# Patient Record
Sex: Female | Born: 1995
Health system: Southern US, Community
[De-identification: ages and names within clinical notes are randomized; demographics above are authoritative.]

## PROBLEM LIST (undated history)

## (undated) DIAGNOSIS — T50905A Adverse effect of unspecified drugs, medicaments and biological substances, initial encounter: Secondary | ICD-10-CM

## (undated) DIAGNOSIS — D571 Sickle-cell disease without crisis: Secondary | ICD-10-CM

## (undated) DIAGNOSIS — R519 Headache, unspecified: Secondary | ICD-10-CM

## (undated) DIAGNOSIS — N179 Acute kidney failure, unspecified: Secondary | ICD-10-CM

## (undated) DIAGNOSIS — D649 Anemia, unspecified: Secondary | ICD-10-CM

## (undated) DIAGNOSIS — K219 Gastro-esophageal reflux disease without esophagitis: Secondary | ICD-10-CM

## (undated) DIAGNOSIS — N912 Amenorrhea, unspecified: Secondary | ICD-10-CM

## (undated) DIAGNOSIS — L298 Other pruritus: Secondary | ICD-10-CM

## (undated) DIAGNOSIS — R51 Headache: Secondary | ICD-10-CM

## (undated) HISTORY — PX: SPLENECTOMY: SUR1306

## (undated) HISTORY — DX: Sickle-cell disease without crisis: D57.1

## (undated) HISTORY — PX: TONSILLECTOMY: SUR1361

---
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
## 2014-02-04 ENCOUNTER — Emergency Department (HOSPITAL_COMMUNITY)
Admission: EM | Admit: 2014-02-04 | Discharge: 2014-02-04 | Disposition: A | Discharge status: Home / Self Care | Payer: Medicaid Other | Attending: Emergency Medicine | Admitting: Emergency Medicine

## 2014-02-04 ENCOUNTER — Encounter (HOSPITAL_COMMUNITY): Payer: Self-pay | Admitting: Emergency Medicine

## 2014-02-04 ENCOUNTER — Ambulatory Visit (HOSPITAL_COMMUNITY)
Admission: RE | Admit: 2014-02-04 | Discharge: 2014-02-04 | Disposition: A | Discharge status: Home / Self Care | Payer: Medicaid Other | Source: Ambulatory Visit | Attending: Emergency Medicine | Admitting: Emergency Medicine

## 2014-02-04 DIAGNOSIS — Y9389 Activity, other specified: Secondary | ICD-10-CM | POA: Insufficient documentation

## 2014-02-04 DIAGNOSIS — Z79899 Other long term (current) drug therapy: Secondary | ICD-10-CM | POA: Insufficient documentation

## 2014-02-04 DIAGNOSIS — M25569 Pain in unspecified knee: Secondary | ICD-10-CM | POA: Insufficient documentation

## 2014-02-04 DIAGNOSIS — IMO0002 Reserved for concepts with insufficient information to code with codable children: Secondary | ICD-10-CM | POA: Insufficient documentation

## 2014-02-04 DIAGNOSIS — J45909 Unspecified asthma, uncomplicated: Secondary | ICD-10-CM | POA: Insufficient documentation

## 2014-02-04 DIAGNOSIS — Z862 Personal history of diseases of the blood and blood-forming organs and certain disorders involving the immune mechanism: Secondary | ICD-10-CM | POA: Insufficient documentation

## 2014-02-04 DIAGNOSIS — F17200 Nicotine dependence, unspecified, uncomplicated: Secondary | ICD-10-CM | POA: Insufficient documentation

## 2014-02-04 DIAGNOSIS — Z8742 Personal history of other diseases of the female genital tract: Secondary | ICD-10-CM | POA: Insufficient documentation

## 2014-02-04 DIAGNOSIS — Y92410 Unspecified street and highway as the place of occurrence of the external cause: Secondary | ICD-10-CM | POA: Insufficient documentation

## 2014-02-04 DIAGNOSIS — Z3202 Encounter for pregnancy test, result negative: Secondary | ICD-10-CM | POA: Insufficient documentation

## 2014-02-04 LAB — POC URINE PREG, ED: PREG TEST UR: NEGATIVE

## 2014-02-04 MED ORDER — HYDROCODONE-ACETAMINOPHEN 5-325 MG PO TABS
ORAL_TABLET | ORAL | Status: AC
  Filled 2014-02-04: qty 1

## 2014-02-04 MED ORDER — IBUPROFEN 200 MG PO TABS
ORAL_TABLET | ORAL | Status: AC
  Filled 2014-02-04: qty 2
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
## 2014-02-08 ENCOUNTER — Emergency Department (HOSPITAL_COMMUNITY)
Admission: EM | Admit: 2014-02-08 | Discharge: 2014-02-08 | Disposition: A | Discharge status: Home / Self Care | Payer: Medicaid Other | Attending: Emergency Medicine | Admitting: Emergency Medicine

## 2014-02-08 ENCOUNTER — Telehealth: Payer: Self-pay | Admitting: Family Medicine

## 2014-02-08 ENCOUNTER — Encounter (HOSPITAL_COMMUNITY): Payer: Self-pay | Admitting: Emergency Medicine

## 2014-02-08 DIAGNOSIS — M25569 Pain in unspecified knee: Secondary | ICD-10-CM | POA: Insufficient documentation

## 2014-02-08 DIAGNOSIS — M546 Pain in thoracic spine: Secondary | ICD-10-CM | POA: Insufficient documentation

## 2014-02-08 DIAGNOSIS — F17200 Nicotine dependence, unspecified, uncomplicated: Secondary | ICD-10-CM | POA: Insufficient documentation

## 2014-02-08 DIAGNOSIS — Z79899 Other long term (current) drug therapy: Secondary | ICD-10-CM | POA: Insufficient documentation

## 2014-02-08 DIAGNOSIS — G8911 Acute pain due to trauma: Secondary | ICD-10-CM | POA: Insufficient documentation

## 2014-02-08 DIAGNOSIS — M79604 Pain in right leg: Secondary | ICD-10-CM

## 2014-02-08 DIAGNOSIS — J45909 Unspecified asthma, uncomplicated: Secondary | ICD-10-CM | POA: Insufficient documentation

## 2014-02-08 DIAGNOSIS — Z8742 Personal history of other diseases of the female genital tract: Secondary | ICD-10-CM | POA: Insufficient documentation

## 2014-02-08 DIAGNOSIS — M25562 Pain in left knee: Secondary | ICD-10-CM

## 2014-02-08 DIAGNOSIS — M549 Dorsalgia, unspecified: Secondary | ICD-10-CM

## 2014-02-08 DIAGNOSIS — Z862 Personal history of diseases of the blood and blood-forming organs and certain disorders involving the immune mechanism: Secondary | ICD-10-CM | POA: Insufficient documentation

## 2014-02-08 MED ORDER — HYDROCODONE-ACETAMINOPHEN 5-325 MG PO TABS
2.0000 | ORAL_TABLET | Freq: Once | ORAL | Status: AC
  Administered 2014-02-08: 2 via ORAL
  Filled 2014-02-08: qty 2

## 2014-02-08 MED ORDER — OXYCODONE HCL 5 MG PO TABS: 5.0000 mg | ORAL_TABLET | ORAL | Status: DC | PRN

## 2014-02-08 NOTE — Discharge Instructions (Signed)
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
## 2014-02-08 NOTE — Telephone Encounter (Signed)
Emergency Line Phone Call  Patient calls stating she went to the [facility] ED a few nights ago for pain in her legs and back. She was discharged home with pain medication to take for this pain. She states she is continuing to have pain despite these medications. She states that she is breathing well. Note the patient has sickle cell SS disease. Given that the patient may be in a pain crisis at this time, I advised her to go to the ED for further evaluation. She voiced understanding of this.  Tommi Rumps, MD

## 2014-02-12 ENCOUNTER — Ambulatory Visit: Payer: Medicaid Other | Admitting: Family Medicine

## 2014-02-12 NOTE — ED Provider Notes (Signed)
Medical screening examination/treatment/procedure(s) were performed by non-physician practitioner and as supervising physician I was immediately available for consultation/collaboration.    Dot Lanes, MD 02/12/14 [phone]
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
## 2014-02-16 ENCOUNTER — Ambulatory Visit: Payer: Medicaid Other | Admitting: Family Medicine

## 2014-02-18 ENCOUNTER — Ambulatory Visit: Payer: Medicaid Other | Admitting: Family Medicine

## 2014-02-24 ENCOUNTER — Other Ambulatory Visit (HOSPITAL_COMMUNITY)
Admission: RE | Admit: 2014-02-24 | Discharge: 2014-02-24 | Disposition: A | Discharge status: Home / Self Care | Payer: Medicaid Other | Source: Ambulatory Visit | Attending: Family Medicine | Admitting: Family Medicine

## 2014-02-24 ENCOUNTER — Encounter: Payer: Self-pay | Admitting: Family Medicine

## 2014-02-24 ENCOUNTER — Ambulatory Visit (INDEPENDENT_AMBULATORY_CARE_PROVIDER_SITE_OTHER): Payer: Medicaid Other | Admitting: Family Medicine

## 2014-02-24 VITALS — BP 124/64 | HR 91 | Temp 98.2°F | Ht 64.0 in | Wt 134.0 lb

## 2014-02-24 DIAGNOSIS — Z7251 High risk heterosexual behavior: Secondary | ICD-10-CM

## 2014-02-24 DIAGNOSIS — Z113 Encounter for screening for infections with a predominantly sexual mode of transmission: Secondary | ICD-10-CM | POA: Insufficient documentation

## 2014-02-24 DIAGNOSIS — N898 Other specified noninflammatory disorders of vagina: Secondary | ICD-10-CM

## 2014-02-24 LAB — POCT WET PREP (WET MOUNT)
CLUE CELLS WET PREP WHIFF POC: POSITIVE
WBC, Wet Prep HPF POC: >20

## 2014-02-24 LAB — RPR

## 2014-02-24 MED ORDER — METRONIDAZOLE 500 MG PO TABS: 500.0000 mg | ORAL_TABLET | Freq: Two times a day (BID) | ORAL | Status: DC

## 2014-02-24 MED ORDER — FLUCONAZOLE 150 MG PO TABS: 150.0000 mg | ORAL_TABLET | Freq: Once | ORAL | Status: DC

## 2014-02-24 NOTE — Addendum Note (Signed)
Addended by: Corinna Capra on: 02/24/2014 04:51 PM   Modules accepted: Orders

## 2014-02-24 NOTE — Progress Notes (Signed)
Family Medicine Office Visit Note   Subjective:   Patient ID: [name], female  DOB: August 06, 1996, 18 y.o.. MRN: [id number]   Pt that comes today complaining of vaginal discharge for about a week. She reports this happened after sexual intercourse and she is concerned about STD's. Denies pelvic pain, dyspareunia, fever, chills, nausea,vomiting, rashes, joint pain or other systemic symptoms. Pt mother died 55 y/o with HIV.  Review of Systems:  Per HPI  Objective:   Physical Exam: Gen:  NAD HEENT: Moist mucous membranes  CV: Regular rate and rhythm, no murmurs rubs or gallops PULM: Clear to auscultation bilaterally. No wheezes/rales/rhonchi ABD: Soft, non tender, non distended, normal bowel sounds EXT: No edema Vulva and perianal area: Normal  Speculum: Vagina and cervix of normal appearance, no friability, white-yellowish curd-like discharge. Bimanual exam: Uterus anteverted, no adnexal masses. No cervical motion tenderness.  Assessment & Plan:

## 2014-02-24 NOTE — Assessment & Plan Note (Signed)
Will obtain wet prep, GC &CT, RPR and HIV Discussed the use of condom.  Wet prep positive for BV and yeast. Will treat with fluconazole and metronidazole.

## 2014-02-24 NOTE — Patient Instructions (Addendum)
It has been a pleasure to see you today. I have prescribes you 2 medications for BV and yeast.  I will call you with the rest pf labs results if they come back abnormal. Use condom for protection.

## 2014-02-25 ENCOUNTER — Inpatient Hospital Stay (HOSPITAL_COMMUNITY)
Admission: EM | Admit: 2014-02-25 | Discharge: 2014-03-02 | DRG: 812 | Disposition: A | Discharge status: Home / Self Care | Payer: Medicaid Other | Attending: Family Medicine | Admitting: Family Medicine

## 2014-02-25 DIAGNOSIS — K5900 Constipation, unspecified: Secondary | ICD-10-CM | POA: Diagnosis present

## 2014-02-25 DIAGNOSIS — N760 Acute vaginitis: Secondary | ICD-10-CM | POA: Diagnosis present

## 2014-02-25 DIAGNOSIS — N390 Urinary tract infection, site not specified: Secondary | ICD-10-CM | POA: Diagnosis present

## 2014-02-25 DIAGNOSIS — F1410 Cocaine abuse, uncomplicated: Secondary | ICD-10-CM | POA: Diagnosis present

## 2014-02-25 DIAGNOSIS — A09 Infectious gastroenteritis and colitis, unspecified: Secondary | ICD-10-CM

## 2014-02-25 DIAGNOSIS — Z9089 Acquired absence of other organs: Secondary | ICD-10-CM

## 2014-02-25 DIAGNOSIS — R011 Cardiac murmur, unspecified: Secondary | ICD-10-CM | POA: Diagnosis present

## 2014-02-25 DIAGNOSIS — J45909 Unspecified asthma, uncomplicated: Secondary | ICD-10-CM

## 2014-02-25 DIAGNOSIS — K5289 Other specified noninfective gastroenteritis and colitis: Secondary | ICD-10-CM | POA: Diagnosis present

## 2014-02-25 DIAGNOSIS — F1210 Cannabis abuse, uncomplicated: Secondary | ICD-10-CM | POA: Diagnosis present

## 2014-02-25 DIAGNOSIS — B9689 Other specified bacterial agents as the cause of diseases classified elsewhere: Secondary | ICD-10-CM | POA: Diagnosis present

## 2014-02-25 DIAGNOSIS — B373 Candidiasis of vulva and vagina: Secondary | ICD-10-CM | POA: Diagnosis present

## 2014-02-25 DIAGNOSIS — A499 Bacterial infection, unspecified: Secondary | ICD-10-CM | POA: Diagnosis present

## 2014-02-25 DIAGNOSIS — K921 Melena: Secondary | ICD-10-CM

## 2014-02-25 DIAGNOSIS — Z8249 Family history of ischemic heart disease and other diseases of the circulatory system: Secondary | ICD-10-CM

## 2014-02-25 DIAGNOSIS — J4520 Mild intermittent asthma, uncomplicated: Secondary | ICD-10-CM

## 2014-02-25 DIAGNOSIS — D5700 Hb-SS disease with crisis, unspecified: Principal | ICD-10-CM | POA: Diagnosis present

## 2014-02-25 DIAGNOSIS — F17200 Nicotine dependence, unspecified, uncomplicated: Secondary | ICD-10-CM | POA: Diagnosis present

## 2014-02-25 DIAGNOSIS — B3731 Acute candidiasis of vulva and vagina: Secondary | ICD-10-CM | POA: Diagnosis present

## 2014-02-25 DIAGNOSIS — N898 Other specified noninflammatory disorders of vagina: Secondary | ICD-10-CM

## 2014-02-25 LAB — RETICULOCYTES
RBC.: 2.73 MIL/uL — ABNORMAL LOW (ref 3.87–5.11)
Retic Count, Absolute: 453.2 10*3/uL — ABNORMAL HIGH (ref 19.0–186.0)
Retic Ct Pct: 16.6 % — ABNORMAL HIGH (ref 0.4–3.1)

## 2014-02-25 LAB — CBC WITH DIFFERENTIAL/PLATELET
Basophils Absolute: 0 10*3/uL (ref 0.0–0.1)
Basophils Relative: 0 % (ref 0–1)
EOS ABS: 0.4 10*3/uL (ref 0.0–0.7)
EOS PCT: 3 % (ref 0–5)
HCT: 25.9 % — ABNORMAL LOW (ref 36.0–46.0)
HEMOGLOBIN: 9.4 g/dL — AB (ref 12.0–15.0)
LYMPHS ABS: 5.1 10*3/uL — AB (ref 0.7–4.0)
LYMPHS PCT: 36 % (ref 12–46)
MCH: 34.4 pg — AB (ref 26.0–34.0)
MCHC: 36.3 g/dL — ABNORMAL HIGH (ref 30.0–36.0)
MCV: 94.9 fL (ref 78.0–100.0)
Monocytes Absolute: 1.3 10*3/uL — ABNORMAL HIGH (ref 0.1–1.0)
Monocytes Relative: 9 % (ref 3–12)
Neutro Abs: 7.3 10*3/uL (ref 1.7–7.7)
Neutrophils Relative %: 52 % (ref 43–77)
Platelets: 743 10*3/uL — ABNORMAL HIGH (ref 150–400)
RBC: 2.73 MIL/uL — AB (ref 3.87–5.11)
RDW: 17.8 % — ABNORMAL HIGH (ref 11.5–15.5)
WBC: 14.1 10*3/uL — ABNORMAL HIGH (ref 4.0–10.5)

## 2014-02-25 LAB — URINALYSIS, ROUTINE W REFLEX MICROSCOPIC
BILIRUBIN URINE: NEGATIVE
GLUCOSE, UA: NEGATIVE mg/dL
Ketones, ur: NEGATIVE mg/dL
Nitrite: NEGATIVE
PH: 6 (ref 5.0–8.0)
Protein, ur: NEGATIVE mg/dL
Specific Gravity, Urine: 1.011 (ref 1.005–1.030)
Urobilinogen, UA: 1 mg/dL (ref 0.0–1.0)

## 2014-02-25 LAB — COMPREHENSIVE METABOLIC PANEL
ALK PHOS: 68 U/L (ref 39–117)
ALT: 15 U/L (ref 0–35)
AST: 18 U/L (ref 0–37)
Albumin: 4.4 g/dL (ref 3.5–5.2)
BUN: 5 mg/dL — ABNORMAL LOW (ref 6–23)
CALCIUM: 10 mg/dL (ref 8.4–10.5)
CO2: 24 mEq/L (ref 19–32)
Chloride: 98 mEq/L (ref 96–112)
Creatinine, Ser: 0.74 mg/dL (ref 0.50–1.10)
GLUCOSE: 91 mg/dL (ref 70–99)
POTASSIUM: 3.7 meq/L (ref 3.7–5.3)
Sodium: 137 mEq/L (ref 137–147)
Total Bilirubin: 4 mg/dL — ABNORMAL HIGH (ref 0.3–1.2)
Total Protein: 8 g/dL (ref 6.0–8.3)

## 2014-02-25 LAB — URINE MICROSCOPIC-ADD ON

## 2014-02-25 LAB — CERVICOVAGINAL ANCILLARY ONLY
Chlamydia: NEGATIVE
NEISSERIA GONORRHEA: NEGATIVE

## 2014-02-25 LAB — POC OCCULT BLOOD, ED: Fecal Occult Bld: POSITIVE — AB

## 2014-02-25 LAB — HIV ANTIBODY (ROUTINE TESTING W REFLEX): HIV: NONREACTIVE

## 2014-02-25 LAB — POC URINE PREG, ED: Preg Test, Ur: NEGATIVE

## 2014-02-25 MED ORDER — SODIUM CHLORIDE 0.9 % IV BOLUS (SEPSIS): 1000.0000 mL | Freq: Once | INTRAVENOUS | Status: DC

## 2014-02-25 MED ORDER — SODIUM CHLORIDE 0.9 % IV BOLUS (SEPSIS)
1000.0000 mL | Freq: Once | INTRAVENOUS | Status: AC
  Administered 2014-02-25: 1000 mL via INTRAVENOUS

## 2014-02-25 MED ORDER — HYDROMORPHONE HCL PF 2 MG/ML IJ SOLN
2.0000 mg | Freq: Once | INTRAMUSCULAR | Status: AC
  Administered 2014-02-25: 2 mg via INTRAVENOUS
  Filled 2014-02-25: qty 1

## 2014-02-25 MED ORDER — HYDROMORPHONE HCL PF 1 MG/ML IJ SOLN
1.0000 mg | Freq: Once | INTRAMUSCULAR | Status: AC
  Administered 2014-02-25: 1 mg via INTRAVENOUS
  Filled 2014-02-25: qty 1

## 2014-02-25 NOTE — ED Provider Notes (Signed)
CSN: [id number]     Arrival date & time 02/25/14  01-[birth year]1915 History   First MD Initiated Contact with Patient 02/25/14 1923     Chief Complaint  Patient presents with  . Back Pain  . Sickle Cell Pain Crisis     (Consider location/radiation/quality/duration/timing/severity/associated sxs/prior Treatment) HPI Comments: Patient presents emergency department with chief complaint of back pain and rectal pain. 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 She states this feels like her typical pain. She also states that for the past 4 days, she has had to strain to have a bowel movement. She does report having bright red blood when wiping. She normally takes morphine, and oxycodone for her sickle cell pain. She states that she has run out of her pain meds. She has not had anything for the past week. She reports moderate to severe pain. She denies any chest pain or shortness of breath. Denies any abdominal pain or dysuria.  The history is provided by the patient. No language interpreter was used.    Past Medical History  Diagnosis Date  . Asthma   . Sickle cell disease   . Amenorrhea    Past Surgical History  Procedure Laterality Date  . 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 Tonsillectomy      Age 78   Family History  Problem Relation Age of Onset  . Hypertension Paternal Grandfather   . Sickle cell trait Father   . Cancer Mother     Died in 01/31/09   History  Substance Use Topics  . Smoking status: Current Some Day Smoker -- 1.00 packs/day    Types: Cigarettes  . Smokeless tobacco: Never Used  . Alcohol Use: 3.6 oz/week    6 Shots of liquor per week   OB History   Grav Para Term Preterm Abortions TAB SAB Ect Mult Living                 Review of Systems  Constitutional: Negative for fever and chills.  Respiratory: Negative for shortness of breath.   Cardiovascular: Negative for chest pain.  Gastrointestinal: Negative for nausea, vomiting, diarrhea and  constipation.  Genitourinary: Negative for dysuria.  Musculoskeletal: Positive for back pain.      Allergies  Review of patient's allergies indicates no known allergies.  Home Medications   Prior to Admission medications   Medication Sig Start Date End Date Taking? Authorizing Provider  albuterol (PROVENTIL HFA;VENTOLIN HFA) 108 (90 BASE) MCG/ACT inhaler Inhale 2 puffs into the lungs every 4 (four) hours as needed for wheezing or shortness of breath. For shortness of breath 08/01/13   Marin Olp, MD  fluconazole (DIFLUCAN) 150 MG tablet Take 1 tablet (150 mg total) by mouth once. 02/24/14   Dayarmys Piloto de Gwendalyn Ege, MD  ibuprofen (ADVIL,MOTRIN) 200 MG tablet Take 800 mg by mouth every 6 (six) hours as needed for mild pain.    Historical Provider, MD  metroNIDAZOLE (FLAGYL) 500 MG tablet Take 1 tablet (500 mg total) by mouth 2 (two) times daily. 02/24/14   Dayarmys Piloto de Gwendalyn Ege, MD  oxyCODONE (ROXICODONE) 5 MG immediate release tablet Take 1 tablet (5 mg total) by mouth every 4 (four) hours as needed for severe pain. 02/08/14   Antonietta Breach, PA-C   BP 116/60  Pulse 83  Temp(Src) 98.2 F (36.8 C) (Oral)  Resp 20  SpO2 96%  LMP 12/28/2013 Physical Exam  Nursing note and vitals  reviewed. Constitutional: She is oriented to person, place, and time. She appears well-developed and well-nourished.  HENT:  Head: Normocephalic and atraumatic.  Eyes: Conjunctivae and EOM are normal. Pupils are equal, round, and reactive to light.  Neck: Normal range of motion. Neck supple.  Cardiovascular: Normal rate and regular rhythm.  Exam reveals no gallop and no friction rub.   No murmur heard. Pulmonary/Chest: Effort normal and breath sounds normal. No respiratory distress. She has no wheezes. She has no rales. She exhibits no tenderness.  Abdominal: Soft. Bowel sounds are normal. She exhibits no distension and no mass. There is no tenderness. There is no rebound and no guarding.   Genitourinary:  Rectal exam: Chaperone present, no internal or external hemorrhoids, no palpable stool ball, no fissure, no evidence of rectal abscess, no gross blood or hemorrhage  Musculoskeletal: Normal range of motion. She exhibits no edema and no tenderness.  Neurological: She is alert and oriented to person, place, and time.  Skin: Skin is warm and dry.  Psychiatric: She has a normal mood and affect. Her behavior is normal. Judgment and thought content normal.    ED Course  Procedures (including critical care time) Results for orders placed during the hospital encounter of 02/25/14  CBC WITH DIFFERENTIAL      Result Value Ref Range   WBC 14.1 (*) 4.0 - 10.5 K/uL   RBC 2.73 (*) 3.87 - 5.11 MIL/uL   Hemoglobin 9.4 (*) 12.0 - 15.0 g/dL   HCT 25.9 (*) 36.0 - 46.0 %   MCV 94.9  78.0 - 100.0 fL   MCH 34.4 (*) 26.0 - 34.0 pg   MCHC 36.3 (*) 30.0 - 36.0 g/dL   RDW 17.8 (*) 11.5 - 15.5 %   Platelets 743 (*) 150 - 400 K/uL   Neutrophils Relative % 52  43 - 77 %   Neutro Abs 7.3  1.7 - 7.7 K/uL   Lymphocytes Relative 36  12 - 46 %   Lymphs Abs 5.1 (*) 0.7 - 4.0 K/uL   Monocytes Relative 9  3 - 12 %   Monocytes Absolute 1.3 (*) 0.1 - 1.0 K/uL   Eosinophils Relative 3  0 - 5 %   Eosinophils Absolute 0.4  0.0 - 0.7 K/uL   Basophils Relative 0  0 - 1 %   Basophils Absolute 0.0  0.0 - 0.1 K/uL  COMPREHENSIVE METABOLIC PANEL      Result Value Ref Range   Sodium 137  137 - 147 mEq/L   Potassium 3.7  3.7 - 5.3 mEq/L   Chloride 98  96 - 112 mEq/L   CO2 24  19 - 32 mEq/L   Glucose, Bld 91  70 - 99 mg/dL   BUN 5 (*) 6 - 23 mg/dL   Creatinine, Ser 0.74  0.50 - 1.10 mg/dL   Calcium 10.0  8.4 - 10.5 mg/dL   Total Protein 8.0  6.0 - 8.3 g/dL   Albumin 4.4  3.5 - 5.2 g/dL   AST 18  0 - 37 U/L   ALT 15  0 - 35 U/L   Alkaline Phosphatase 68  39 - 117 U/L   Total Bilirubin 4.0 (*) 0.3 - 1.2 mg/dL   GFR calc non Af Amer >90  >90 mL/min   GFR calc Af Amer >90  >90 mL/min  RETICULOCYTES       Result Value Ref Range   Retic Ct Pct 16.6 (*) 0.4 - 3.1 %   RBC. 2.73 (*)  3.87 - 5.11 MIL/uL   Retic Count, Manual 453.2 (*) 19.0 - 186.0 K/uL  URINALYSIS, ROUTINE W REFLEX MICROSCOPIC      Result Value Ref Range   Color, Urine YELLOW  YELLOW   APPearance CLOUDY (*) CLEAR   Specific Gravity, Urine 1.011  1.005 - 1.030   pH 6.0  5.0 - 8.0   Glucose, UA NEGATIVE  NEGATIVE mg/dL   Hgb urine dipstick SMALL (*) NEGATIVE   Bilirubin Urine NEGATIVE  NEGATIVE   Ketones, ur NEGATIVE  NEGATIVE mg/dL   Protein, ur NEGATIVE  NEGATIVE mg/dL   Urobilinogen, UA 1.0  0.0 - 1.0 mg/dL   Nitrite NEGATIVE  NEGATIVE   Leukocytes, UA LARGE (*) NEGATIVE  URINE MICROSCOPIC-ADD ON      Result Value Ref Range   Squamous Epithelial / LPF FEW (*) RARE   WBC, UA 3-6  <3 WBC/hpf   RBC / HPF 0-2  <3 RBC/hpf   Bacteria, UA RARE  RARE   Urine-Other MUCOUS PRESENT    POC URINE PREG, ED      Result Value Ref Range   Preg Test, Ur NEGATIVE  NEGATIVE  POC OCCULT BLOOD, ED      Result Value Ref Range   Fecal Occult Bld POSITIVE (*) NEGATIVE   Dg Knee Complete 4 Views Left  02/04/2014   CLINICAL DATA:  Right knee pain.  EXAM: LEFT KNEE - COMPLETE 4+ VIEW  COMPARISON:  None.  FINDINGS: There is no evidence of fracture or dislocation. The joint spaces are preserved. No significant degenerative change is seen; the patellofemoral joint is grossly unremarkable in appearance.  No significant joint effusion is seen. The visualized soft tissues are normal in appearance.  IMPRESSION: No evidence of fracture or dislocation.   Electronically Signed   By: Garald Balding M.D.   On: 02/04/2014 01:28      EKG Interpretation None      MDM   Final diagnoses:  Sickle cell pain crisis    Patient with sickle cell pain. She states that the pain is mostly in her low back.  She denies any chest pain.  Will check labs, give fluids, and treat pain.  10:51 PM Patient re-examined x3.  Consider admission.  Discussed the patient  with Dr. Tomi Bamberger, will admit to [facility].  [facility] to [facility].    Montine Circle, PA-C 02/25/14 2323

## 2014-02-25 NOTE — ED Notes (Signed)
Sickle cell crisis with back pain today.  Pt reports she ran out of her pain meds x 1 week.

## 2014-02-25 NOTE — ED Notes (Signed)
Pt reports back pain and rectal pain x 4 days.    Pt reports LBM x 3 days ago.

## 2014-02-25 NOTE — ED Notes (Signed)
Pt reports running out of her pain meds x 1 week.  Per EMS, pt was picked up from a hotel.

## 2014-02-25 NOTE — ED Provider Notes (Signed)
Medical screening examination/treatment/procedure(s) were performed by non-physician practitioner and as supervising physician I was immediately available for consultation/collaboration.  Pt has history of sickle cell disease.  Presents with recurrent attack.  Pain persists despite treatment in the ED.    Plan on admission for further treatment.  Kathalene Frames, MD 02/25/14 [phone]

## 2014-02-26 ENCOUNTER — Encounter (HOSPITAL_COMMUNITY): Payer: Self-pay | Admitting: General Practice

## 2014-02-26 LAB — CBC WITH DIFFERENTIAL/PLATELET
BASOS ABS: 0 10*3/uL (ref 0.0–0.1)
Basophils Absolute: 0 10*3/uL (ref 0.0–0.1)
Basophils Relative: 0 % (ref 0–1)
Basophils Relative: 0 % (ref 0–1)
EOS PCT: 3 % (ref 0–5)
EOS PCT: 2 % (ref 0–5)
Eosinophils Absolute: 0.5 10*3/uL (ref 0.0–0.7)
Eosinophils Absolute: 0.4 10*3/uL (ref 0.0–0.7)
HCT: 22.9 % — ABNORMAL LOW (ref 36.0–46.0)
HCT: 25.3 % — ABNORMAL LOW (ref 36.0–46.0)
Hemoglobin: 8 g/dL — ABNORMAL LOW (ref 12.0–15.0)
Hemoglobin: 9.1 g/dL — ABNORMAL LOW (ref 12.0–15.0)
LYMPHS ABS: 5.6 10*3/uL — AB (ref 0.7–4.0)
LYMPHS PCT: 35 % (ref 12–46)
Lymphocytes Relative: 40 % (ref 12–46)
Lymphs Abs: 6.4 10*3/uL — ABNORMAL HIGH (ref 0.7–4.0)
MCH: 34 pg (ref 26.0–34.0)
MCH: 34.6 pg — ABNORMAL HIGH (ref 26.0–34.0)
MCHC: 34.9 g/dL (ref 30.0–36.0)
MCHC: 36 g/dL (ref 30.0–36.0)
MCV: 97.4 fL (ref 78.0–100.0)
MCV: 96.2 fL (ref 78.0–100.0)
MONO ABS: 1.6 10*3/uL — AB (ref 0.1–1.0)
MONO ABS: 1.5 10*3/uL — AB (ref 0.1–1.0)
MONOS PCT: 10 % (ref 3–12)
MONOS PCT: 9 % (ref 3–12)
NEUTROS PCT: 47 % (ref 43–77)
Neutro Abs: 7.4 10*3/uL (ref 1.7–7.7)
Neutro Abs: 8.7 10*3/uL — ABNORMAL HIGH (ref 1.7–7.7)
Neutrophils Relative %: 54 % (ref 43–77)
PLATELETS: 608 10*3/uL — AB (ref 150–400)
Platelets: 709 10*3/uL — ABNORMAL HIGH (ref 150–400)
RBC: 2.35 MIL/uL — ABNORMAL LOW (ref 3.87–5.11)
RBC: 2.63 MIL/uL — AB (ref 3.87–5.11)
RDW: 18.3 % — ABNORMAL HIGH (ref 11.5–15.5)
RDW: 18.6 % — AB (ref 11.5–15.5)
SMEAR REVIEW: INCREASED
WBC: 15.9 10*3/uL — AB (ref 4.0–10.5)
WBC: 16.2 10*3/uL — ABNORMAL HIGH (ref 4.0–10.5)

## 2014-02-26 LAB — RAPID URINE DRUG SCREEN, HOSP PERFORMED
Amphetamines: NOT DETECTED
Barbiturates: NOT DETECTED
Benzodiazepines: NOT DETECTED
Cocaine: POSITIVE — AB
Opiates: POSITIVE — AB
Tetrahydrocannabinol: POSITIVE — AB

## 2014-02-26 LAB — COMPREHENSIVE METABOLIC PANEL
ALT: 12 U/L (ref 0–35)
AST: 16 U/L (ref 0–37)
Albumin: 3.7 g/dL (ref 3.5–5.2)
Alkaline Phosphatase: 56 U/L (ref 39–117)
BUN: 6 mg/dL (ref 6–23)
CALCIUM: 9.1 mg/dL (ref 8.4–10.5)
CO2: 24 meq/L (ref 19–32)
CREATININE: 0.73 mg/dL (ref 0.50–1.10)
Chloride: 102 mEq/L (ref 96–112)
GLUCOSE: 87 mg/dL (ref 70–99)
Potassium: 3.7 mEq/L (ref 3.7–5.3)
Sodium: 138 mEq/L (ref 137–147)
Total Bilirubin: 4.5 mg/dL — ABNORMAL HIGH (ref 0.3–1.2)
Total Protein: 7 g/dL (ref 6.0–8.3)

## 2014-02-26 LAB — PATHOLOGIST SMEAR REVIEW

## 2014-02-26 LAB — RETICULOCYTES
RBC.: 2.35 MIL/uL — ABNORMAL LOW (ref 3.87–5.11)
RETIC COUNT ABSOLUTE: 385.4 10*3/uL — AB (ref 19.0–186.0)
Retic Ct Pct: 16.4 % — ABNORMAL HIGH (ref 0.4–3.1)

## 2014-02-26 LAB — MAGNESIUM: Magnesium: 1.8 mg/dL (ref 1.5–2.5)

## 2014-02-26 MED ORDER — METRONIDAZOLE 500 MG PO TABS
500.0000 mg | ORAL_TABLET | Freq: Two times a day (BID) | ORAL | Status: DC
  Administered 2014-02-26 – 2014-02-28 (×6): 500 mg via ORAL
  Filled 2014-02-26 (×8): qty 1

## 2014-02-26 MED ORDER — HEPARIN SODIUM (PORCINE) 5000 UNIT/ML IJ SOLN
5000.0000 [IU] | Freq: Three times a day (TID) | INTRAMUSCULAR | Status: DC
  Administered 2014-02-26 – 2014-02-28 (×8): 5000 [IU] via SUBCUTANEOUS
  Filled 2014-02-26 (×13): qty 1

## 2014-02-26 MED ORDER — DEXTROSE 5 % IV SOLN
1.0000 g | INTRAVENOUS | Status: DC
  Administered 2014-02-26 – 2014-03-01 (×4): 1 g via INTRAVENOUS
  Filled 2014-02-26 (×4): qty 10

## 2014-02-26 MED ORDER — ONDANSETRON HCL 4 MG/2ML IJ SOLN
4.0000 mg | INTRAMUSCULAR | Status: DC | PRN
  Administered 2014-03-01 – 2014-03-02 (×2): 4 mg via INTRAVENOUS
  Filled 2014-02-26 (×2): qty 2

## 2014-02-26 MED ORDER — HYDROMORPHONE HCL PF 1 MG/ML IJ SOLN
1.0000 mg | INTRAMUSCULAR | Status: DC | PRN
  Administered 2014-02-26 – 2014-02-27 (×8): 1 mg via INTRAVENOUS
  Filled 2014-02-26 (×8): qty 1

## 2014-02-26 MED ORDER — KETOROLAC TROMETHAMINE 15 MG/ML IJ SOLN
30.0000 mg | Freq: Four times a day (QID) | INTRAMUSCULAR | Status: DC
Start: 2014-02-27 — End: 2014-02-27
  Administered 2014-02-26 – 2014-02-27 (×2): 30 mg via INTRAVENOUS
  Filled 2014-02-26 (×5): qty 2

## 2014-02-26 MED ORDER — ONDANSETRON HCL 4 MG PO TABS: 4.0000 mg | ORAL_TABLET | ORAL | Status: DC | PRN

## 2014-02-26 MED ORDER — BOOST / RESOURCE BREEZE PO LIQD
1.0000 | Freq: Two times a day (BID) | ORAL | Status: DC
  Administered 2014-02-26 – 2014-03-02 (×6): 1 via ORAL

## 2014-02-26 MED ORDER — HYDROMORPHONE HCL PF 1 MG/ML IJ SOLN
1.0000 mg | INTRAMUSCULAR | Status: DC | PRN
  Filled 2014-02-26: qty 1

## 2014-02-26 MED ORDER — DIPHENHYDRAMINE HCL 25 MG PO CAPS
25.0000 mg | ORAL_CAPSULE | Freq: Four times a day (QID) | ORAL | Status: DC | PRN
  Administered 2014-02-26 – 2014-02-28 (×2): 25 mg via ORAL
  Filled 2014-02-26 (×2): qty 1

## 2014-02-26 MED ORDER — MORPHINE SULFATE ER 15 MG PO TBCR
15.0000 mg | EXTENDED_RELEASE_TABLET | Freq: Two times a day (BID) | ORAL | Status: DC
  Administered 2014-02-26 – 2014-02-27 (×4): 15 mg via ORAL
  Filled 2014-02-26 (×4): qty 1

## 2014-02-26 MED ORDER — SENNOSIDES-DOCUSATE SODIUM 8.6-50 MG PO TABS
2.0000 | ORAL_TABLET | Freq: Two times a day (BID) | ORAL | Status: DC
  Administered 2014-02-26 – 2014-02-27 (×2): 2 via ORAL
  Filled 2014-02-26 (×2): qty 2

## 2014-02-26 MED ORDER — FOLIC ACID 1 MG PO TABS
1.0000 mg | ORAL_TABLET | Freq: Every day | ORAL | Status: DC
  Administered 2014-02-26 – 2014-03-02 (×5): 1 mg via ORAL
  Filled 2014-02-26 (×5): qty 1

## 2014-02-26 MED ORDER — ALBUTEROL SULFATE (2.5 MG/3ML) 0.083% IN NEBU: 2.5000 mg | INHALATION_SOLUTION | RESPIRATORY_TRACT | Status: DC | PRN

## 2014-02-26 MED ORDER — ENSURE COMPLETE PO LIQD
237.0000 mL | Freq: Two times a day (BID) | ORAL | Status: DC
  Administered 2014-02-26 – 2014-03-02 (×4): 237 mL via ORAL

## 2014-02-26 MED ORDER — ADULT MULTIVITAMIN W/MINERALS CH
1.0000 | ORAL_TABLET | Freq: Every day | ORAL | Status: DC
  Administered 2014-02-26 – 2014-03-02 (×5): 1 via ORAL
  Filled 2014-02-26 (×5): qty 1

## 2014-02-26 MED ORDER — SENNOSIDES-DOCUSATE SODIUM 8.6-50 MG PO TABS
2.0000 | ORAL_TABLET | Freq: Once | ORAL | Status: AC
  Administered 2014-02-26: 2 via ORAL
  Filled 2014-02-26: qty 2

## 2014-02-26 MED ORDER — KETOROLAC TROMETHAMINE 15 MG/ML IJ SOLN
15.0000 mg | Freq: Four times a day (QID) | INTRAMUSCULAR | Status: DC
  Administered 2014-02-26 (×4): 15 mg via INTRAVENOUS
  Filled 2014-02-26 (×8): qty 1

## 2014-02-26 MED ORDER — SENNOSIDES-DOCUSATE SODIUM 8.6-50 MG PO TABS: 1.0000 | ORAL_TABLET | Freq: Two times a day (BID) | ORAL | Status: DC

## 2014-02-26 NOTE — Progress Notes (Signed)
Patient tearful and frustrated with her pain control. She stated that she wanted to speak to her primary care physician in the morning. On call physician notified and dosage of Toradol increased. Also, patient has not had a formed stool at this point but it is noted to be watery and blood tinged. Will continue to monitor. Rande Brunt Jakeob Tullis

## 2014-02-26 NOTE — Progress Notes (Signed)
UR Completed.  [name] [name] [phone] 02/26/2014  

## 2014-02-26 NOTE — H&P (Signed)
[facility] Admission History and Physical Service Pager: [phone]  Patient name: [name] Medical record number: [id number] Date of birth: 10/24/1996 Age: 18 y.o. Gender: female  Primary Care Provider: Garret Reddish, MD Consultants: None Code Status: Full code  Chief Complaint: Pain  Assessment and Plan: [name] is a 18 y.o. female presenting with severe back pain . PMH is significant for sickle cell disease (HgSS)  # Back pain: patient states this is not her usual pain crisis location. No dysuria, frequency or fevers. No history of trauma. No extremity tenderness. Could be additional component of recent constipation playing a role in pain. Hemoglobin (9.4) is currently at baseline (8.7-9.4) with elevated retics (16.6). No signs or symptoms of acute chest.  Admit to inpatient, telemetry, attending Dr. Ree Kida  Pain control  Dilaudid 1mg  q3hrs PRN  Toradol 15mg  IV QID x5 days  MS Contin 15mg  BID  Senna-docusate 2 BID for constipation  Routine pulse oximetry  Incentive spirometry  Folic acid  Follow-up CBC and retic in AM  Monitor for development of acute chest (fevers, chest pain, O2 requirement); will need chest x-ray if any new respiratory symptoms  Follow-up UDS  # positive UA:  no low urinary symptoms, no fevers. Back pain is bilateral and no consistent with pyelo. She has been recently treated for BV, urine was clean catch.   Will treat empirically and collect urine for culture.   Will consider imaging if pain is not controlled with current regimen.    # Bacterial vaginosis: diagnosed at recent office visit  Continue metronidazole 500mg  BID  # Sickle cell disease s/p splenectomy: patient has had previous admissions where she has left against medical advice when not given a pain regimen she felt was appropriate (from chart review only)  Patient will need follow-up. Appears she was referred to [facility] but  never followed through with them. Can address this with patient before discharge  # Bright red blood per rectum: patient states she had first episode yesterday after trying to have a bowel movement. Bowel movements have been significantly difficult since 5 days ago. No family history of colon cancer. Patient is a poor historian for providing accurate description of blood. [facility] positive at [facility]. Hb is stable at her baseline  Bowel regimen   Follow-up for reoccurrence and trend CBC inpatient  Follow-up outpatient  FEN/GI: Regular diet, saline lock IV to minimize fluid overload and potentially pushing patient to acute chest Prophylaxis: heparin subq  Disposition: Admit to telemetry  History of Present Illness: [name] is a 18 y.o. female presenting with severe lower back and posterior hip pain. Pain started four days ago and has been getting worse. Patient tried ibuprofen and Advil for pain. Sickle cell pain usually in legs and arms. This pain is different. No recent trauma. Laying or sitting down makes pain worse due to pressure. No alleviating factors. No headaches, chest pain, shortness of breath, nausea, vomiting, diarrhea. Constipation for three days. Straining during attempts. Was recently seen at [facility], but pain was not as bad during visit as it is today. Saw some blood in stool after days of straining to have a bowel movement.   In ED, she received a total of 5mg  of dilaudid in about 1 hour with inadequate control of pain. Was also given a 1L bolus of NS.  Review Of Systems: Per HPI with the following additions: No dysuria, +blood per rectum. Otherwise 12 point review of systems was performed and  was unremarkable.  Patient Active Problem List   Diagnosis Date Noted  . Vaginal discharge 02/24/2014  . Sickle cell pain crisis 11/23/2013  . Facial rash 03/25/2013  . Birth control counseling 08/14/2012  . Asthma, mild intermittent 12/14/2010  . Hb-SS disease with crisis  10/12/2009   Past Medical History: Past Medical History  Diagnosis Date  . Asthma   . Sickle cell disease   . Amenorrhea    Past Surgical History: Past Surgical History  Procedure Laterality Date  . 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 Tonsillectomy      Age 42   Social History: History  Substance Use Topics  . Smoking status: Current Some Day Smoker -- 1.00 packs/day    Types: Cigarettes  . Smokeless tobacco: Never Used  . Alcohol Use: 3.6 oz/week    6 Shots of liquor per week   Additional social history: less than 1 pack per day. Denies illicit drug use. Please also refer to relevant sections of EMR.  Family History: Family History  Problem Relation Age of Onset  . Hypertension Paternal Grandfather   . Sickle cell trait Father   . Cancer Mother     Died in February 12, 2009   Allergies and Medications: No Known Allergies No current facility-administered medications on file prior to encounter.   Current Outpatient Prescriptions on File Prior to Encounter  Medication Sig Dispense Refill  . albuterol (PROVENTIL HFA;VENTOLIN HFA) 108 (90 BASE) MCG/ACT inhaler Inhale 2 puffs into the lungs every 4 (four) hours as needed for wheezing or shortness of breath. For shortness of breath      . ibuprofen (ADVIL,MOTRIN) 200 MG tablet Take 800 mg by mouth every 6 (six) hours as needed for mild pain.      . fluconazole (DIFLUCAN) 150 MG tablet Take 1 tablet (150 mg total) by mouth once.  1 tablet  0  . metroNIDAZOLE (FLAGYL) 500 MG tablet Take 1 tablet (500 mg total) by mouth 2 (two) times daily.  14 tablet  0    Objective: BP 121/68  Pulse 64  Temp(Src) 97.9 F (36.6 C) (Oral)  Resp 16  SpO2 98%  LMP 12/28/2013  Exam: General: Laying in bed, crying, appears to be in significant pain, room smells like marijuana HEENT: PERRL, EOMI, moist mucous  membranes Cardiovascular: Regular rate and rhythm, no murmur Respiratory: Clear to auscultation bilaterally, no wheezing Abdomen:  Soft, non-tender, non-distended, no palpable masses Extremities: No calf tenderness Musculoskeletal: has tenderness along thoracic and lumbar back overlying spine and paraspinal area down to iliac crest Skin: Warm, well perfused Neuro: Alert, oriented x3  Labs and Imaging: CBC BMET   Recent Labs Lab 02/25/14 02-12-2034  WBC 14.1*  HGB 9.4*  HCT 25.9*  PLT 743*    Recent Labs Lab 02/25/14 02-12-34  NA 137  K 3.7  CL 98  CO2 24  BUN 5*  CREATININE 0.74  GLUCOSE 91  CALCIUM 10.0     Urinalysis    Component Value Date/Time   COLORURINE YELLOW 02/25/2014 2053-02-12   APPEARANCEUR CLOUDY* 02/25/2014 12-Feb-2053   LABSPEC 1.011 02/25/2014 Feb 12, 2053   PHURINE 6.0 02/25/2014 02/12/2053   [place] 02/25/2014 02/12/2053   HGBUR SMALL* 02/25/2014 02-12-53   [employer] NEGATIVE 02/25/2014 Feb 12, 2053   [place] NEGATIVE 02/25/2014 Feb 12, 2053   PROTEINUR NEGATIVE 02/25/2014 February 12, 2053   UROBILINOGEN 1.0 02/25/2014 02-12-53   NITRITE NEGATIVE 02/25/2014 12-Feb-2053   LEUKOCYTESUR LARGE* 02/25/2014 02/12/2053    Cordelia Poche, MD 02/26/2014, 2:06 AM  PGY-1, [place] Intern pager: [phone], text pages welcome  Teaching Service Addendum. I have seen and evaluated this pt and agree with Dr. Lisbeth Ply assessment and plan as it is documented on this note.   D. Piloto Philippa Sicks, MD Family Medicine  PGY-3

## 2014-02-26 NOTE — Progress Notes (Signed)
FMTS ATTENDING NOTE [name] I have discussed this patient with the resident. I agree with the resident's findings, assessment and care plan. Pain control with dilaudid prn as well as MS Contin. She also seem to have dropped her hemoglobin by one point without being on IV hydration,just oral hydration, this is concerning due to positive stool guaiac, although she has constipation causing her to strain which could had caused mild anal tear. Plan to recheck CBC today, if hemoglobin drops even further less than 7 will plan to transfuse her and assess for GI bleed. For her UTI, culture is pending,currently on Rocephin. 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

## 2014-02-26 NOTE — Progress Notes (Signed)
Patient complained of severe pain after medicated with prn dilaudid and scheduled MS. Contin. MD notified about patient's request to increase Dilaudid dose but stated not at this time. Will monitor how current meds work.

## 2014-02-26 NOTE — Progress Notes (Signed)
Patient has received pain medications, no IV fluids, oxygen at this time and  patient asleep, vitals are within normal range, will continue to monitor.

## 2014-02-26 NOTE — Progress Notes (Signed)
INITIAL NUTRITION ASSESSMENT  DOCUMENTATION CODES Per approved criteria  -Not Applicable   INTERVENTION: Provide Ensure Complete BID in between meals Provide Resource Breeze BID with meals Provide Multivitamin with minerals daily  NUTRITION DIAGNOSIS: Inadequate oral intake related to sickle cell pain as evidenced by reported meal intake < 50% for few days and reported 6 lb (4% of weight) weight loss.   Goal: Pt to meet >/= 90% of their estimated nutrition needs   Monitor:  PO intake, weight trend, labs  Reason for Assessment: Malnutrition Screening Tool  18 y.o. female  Admitting Dx: <principal problem not specified>  ASSESSMENT: 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 with PMH sickle cell disease presents with four day history of bilateral low back pain, patient was evaluated on 02/24/14 in the [facility] for vaginal discharge and diagnosed with yeast infection and BV, patient did not pick up prescribed medications, she has back pain that developed a few days prior which was not discussed at her time of visit, the back pain is dull/bilateral low back, associated dysuria and constipation, she reports her last BM as 4 days ago, has had some blood in her stool over the past few days, no history of hemorrhoids, rectal exam in ED was negative for hemorrhoids/fissures. Admitted 02/25/13 for possible sickle cell crisis.   Pt reports that she has had a decreased appetite due to pain for the past few days. She states she has been eating about 50% less during this time and has lost weight. She states she usually weighs around 140 lbs. Pt states she ate well at breakfast today but, only ate a few bites of her lunch. Pt agreeable to trying nutritional supplements. Pt appears well-nourished.  Nutrition Focused Physical Exam:  Subcutaneous Fat:  Orbital Region: wnl Upper Arm Region: wnl Thoracic and Lumbar Region: NA  Muscle:  Temple Region: wnl Clavicle Bone Region: mild wasting Clavicle and  Acromion Bone Region: wnl Scapular Bone Region: NA Dorsal Hand: wnl Patellar Region: wnl Anterior Thigh Region: wnl Posterior Calf Region: wnl  Edema: none   Height: Ht Readings from Last 1 Encounters:  02/26/14 5\' 4"  (1.626 m) (46%*, Z = -0.10)   * Growth percentiles are based on CDC 2-20 Years data.    Weight: Wt Readings from Last 1 Encounters:  02/26/14 134 lb (60.782 kg) (63%*, Z = 0.33)   * Growth percentiles are based on CDC 2-20 Years data.    Ideal Body Weight: 120 lbs  % Ideal Body Weight: 116%  Wt Readings from Last 10 Encounters:  02/26/14 134 lb (60.782 kg) (63%*, Z = 0.33)  02/24/14 134 lb (60.782 kg) (63%*, Z = 0.34)  11/24/13 143 lb 11.8 oz (65.2 kg) (77%*, Z = 0.73)  10/02/13 135 lb (61.236 kg) (66%*, Z = 0.42)  08/01/13 128 lb (58.06 kg) (56%*, Z = 0.14)  05/20/13 128 lb 1.6 oz (58.106 kg) (57%*, Z = 0.17)  03/25/13 131 lb (59.421 kg) (62%*, Z = 0.31)  03/08/13 145 lb 8.1 oz (66 kg) (80%*, Z = 0.86)  02/14/13 132 lb 7.9 oz (60.1 kg) (65%*, Z = 0.39)  12/25/12 125 lb 11.2 oz (57.017 kg) (54%*, Z = 0.10)   * Growth percentiles are based on CDC 2-20 Years data.    Usual Body Weight: 140 lbs  % Usual Body Weight: 96%  BMI:  Body mass index is 22.99 kg/(m^2).  Estimated Nutritional Needs: Kcal: 1800-2000 Protein: 65-75 grams Fluid: 1.8-2L/day  Skin: WDL  Diet Order: General  EDUCATION  NEEDS: -No education needs identified at this time   Intake/Output Summary (Last 24 hours) at 02/26/14 1525 Last data filed at 02/26/14 0300  Gross per 24 hour  Intake      0 ml  Output      1 ml  Net     -1 ml    Last BM: 4/26  Labs:   Recent Labs Lab 02/25/14 2035 02/26/14 0645  NA 137 138  K 3.7 3.7  CL 98 102  CO2 24 24  BUN 5* 6  CREATININE 0.74 0.73  CALCIUM 10.0 9.1  MG  --  1.8  GLUCOSE 91 87    CBG (last 3)  No results found for this basename: GLUCAP,  in the last 72 hours  Scheduled Meds: . cefTRIAXone (ROCEPHIN)  IV  1  g Intravenous Q24H  . folic acid  1 mg Oral Daily  . heparin  5,000 Units Subcutaneous 3 times per day  . ketorolac  15 mg Intravenous 4 times per day  . metroNIDAZOLE  500 mg Oral BID  . morphine  15 mg Oral Q12H  . senna-docusate  2 tablet Oral BID    Continuous Infusions:   Past Medical History  Diagnosis Date  . Asthma   . Sickle cell disease   . Amenorrhea     Past Surgical History  Procedure Laterality Date  . 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 Tonsillectomy      Age [place], LDN Inpatient Clinical Dietitian Pager: [phone] After Hours Pager: [phone]

## 2014-02-26 NOTE — H&P (Addendum)
FMTS Attending Note  I personally saw and evaluated the patient. The plan of care was discussed with the resident team. I agree with the assessment and plan as documented by the resident.   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 with PMH sickle cell disease presents with four day history of bilateral low back pain, patient was evaluated on 02/24/14 in the [facility] for vaginal discharge and diagnosed with yeast infection and BV, patient did not pick up prescribed medications, she has back pain that developed a few days prior which was not discussed at her time of visit, the back pain is dull/bilateral low back, associated dysuria and constipation, she reports her last BM as 4 days ago, has had some blood in her stool over the past few days, no history of hemorrhoids, rectal exam in ED was negative for hemorrhoids/fissures. Admitted 02/25/13 for possible sickle cell crisis. Pain is not controlled this AM with PRN Toradaol and Dilauded, patient is on MS contin 15 mg BID which is being continued in office, no chest pain, no shortness of breath, no nausea, no leg weakness, no numbness  Vitals: reviewed Gen: pleasant AAF, drowsy but arousable, NAD HEENT: PERRL, EOMI, no scleral icterus, MMM, uvula midline, neck supple, no anterior or posterior cervical lymphadenopathy Cardiac: RRR, S1 and S2 present, 3/6 systolic murmur, no heaves/thrills Resp: CTAB, normal effort ABD: soft, no tenderness, normal bowel sounds, no rebound, no guarding Ext: no edema, 2+ radial and DP pulses MSK; diffuse lumbar tenderness (paraspinal and midline), CVA + bilaterally (may be related to muscle pain).  Skin: no rash  Reviewed lab work since time of admission  Assessment and Plan: 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 PMH sickle cell disease presents with back pain.  1. Back Pain - could be due to multiple issues including sickle cell pain crisis/UTI/constipation/BV/vaginal yeast infection, please refer to plan for each below 2. BV/vaginal yeast infection -  initiate therapy with Flagyl and Diflucan 3. UTI - based on UA, culture pending, start Rocephin, no plan for imaging at this time however if patient develops fevers/symptoms worsen then consider imaging of kidneys to rule out pyelonephritis (currently covered with Rocephin). 4. Sickle Cell Pain - continue home MS Contin, wean Toradol and Dilauded as able to, hemoglobin stable/retic count appropriately elevated 5. Constipation with [name] positive - start bowel regimen, hemoglobin stable  Dossie Arbour MD

## 2014-02-26 NOTE — Progress Notes (Signed)
Family Medicine Teaching Service Daily Progress Note Intern Pager: [phone]  Patient name: [name] Medical record number: [id number] Date of birth: [birth year] Age: 18 y.o. Gender: female  Primary Care Provider: Garret Reddish, MD Consultants: None Code Status: Full code  Pt Overview and Major Events to Date:  4/29  Assessment and Plan: [name] is a 18 y.o. female presenting with severe back pain . PMH is significant for sickle cell disease (HgSS)  # Back pain: patient states this is not her usual pain crisis location. No dysuria, frequency or fevers. No history of trauma. No extremity tenderness. Could be additional component of recent constipation playing a role in pain. On admission, hemoglobin (9.4)  at baseline (8.7-9.4) with elevated retics (16.6). No signs or symptoms of acute chest. Current hgb of 8.0 with retic% of 16.4 (which is appropriate)  Pain control  Dilaudid 1mg  q3hrs PRN  Toradol 15mg  IV QID x5 days  MS Contin 15mg  BID  Senna-docusate 2 BID for constipation  Routine pulse oximetry  Incentive spirometry  Folic acid  Daily CBC and retic  Consider transfusion if patient hgb trends below 7  Monitor for development of acute chest (fevers, chest pain, O2 requirement); will need chest x-ray if any new respiratory symptoms  Follow-up UDS  # Pyuria:  no low urinary symptoms, no fevers. Back pain is bilateral and not consistent with pyelo. She has been recently treated for BV, urine was clean catch. Leukocytosis with increase of WBC to 15.9 today.  Will treat empirically and collect urine for culture.   Will consider imaging if pain is not controlled with current regimen.    # Bacterial vaginosis: diagnosed at recent office visit  Continue metronidazole 500mg  BID  # Sickle cell disease s/p splenectomy: patient has had previous admissions where she has left against medical advice when not given a pain regimen she felt was  appropriate (from chart review only)  Patient will need follow-up. Appears she was referred to [facility] but never followed through with them. Can address this with patient before discharge  # Bright red blood per rectum: patient states she had first episode yesterday after trying to have a bowel movement. Bowel movements have been significantly difficult since 5 days ago. No family history of colon cancer. Patient is a poor historian for providing accurate description of blood. FOBT positive at [facility]. Hb is stable at her baseline  Bowel regimen   Follow-up for reoccurrence and trend CBC inpatient  Follow-up outpatient  FEN/GI: Regular diet, saline lock IV to minimize fluid overload and potentially pushing patient to acute chest Prophylaxis: heparin subq  Disposition: Home pending better control of pain  Subjective:  Patient is still in pain, which has remained stable. She reports that the pain in her leg is better and described it as throbbing.  Objective: Temp:  [97.7 F (36.5 C)-98.2 F (36.8 C)] 97.7 F (36.5 C) (04/30 0529) Pulse Rate:  [64-83] 82 (04/30 0529) Resp:  [16-20] 16 (04/30 0529) BP: (104-121)/(43-68) 106/58 mmHg (04/30 0529) SpO2:  [96 %-100 %] 97 % (04/30 0529) Weight:  [134 lb (60.782 kg)] 134 lb (60.782 kg) (04/30 0144)  Physical Exam: General: Sleeping comfortably, in no acute distress, easily arouses Cardiovascular: Regular rate and rhythm, no murmur Respiratory: Clear to auscultation bilaterally Abdomen: Soft, non-tender, non-distended Extremities: No calf tenderness Musculoskeletal: diffuse lumbar back and posterior hip tenderness  Laboratory:  Recent Labs Lab 02/25/14 2035 02/26/14 0645  WBC 14.1* 15.9*  HGB 9.4* 8.0*  HCT  25.9* 22.9*  PLT 743* 608*    Recent Labs Lab 02/25/14 2035 02/26/14 0645  NA 137 138  K 3.7 3.7  CL 98 102  CO2 24 24  BUN 5* 6  CREATININE 0.74 0.73  CALCIUM 10.0 9.1  PROT 8.0 7.0  BILITOT 4.0* 4.5*  ALKPHOS 68 56   ALT 15 12  AST 18 16  GLUCOSE 91 87    Retic Ct Pct  Date Value Ref Range Status  02/26/2014 16.4* 0.4 - 3.1 % Final   Retic Count, Manual  Date Value Ref Range Status  02/26/2014 385.4* 19.0 - 186.0 K/uL Final    Imaging/Diagnostic Tests:  No results found.   Cordelia Poche, MD 02/26/2014, 9:06 AM PGY-1, [place] Intern pager: [phone], text pages welcome

## 2014-02-26 NOTE — Progress Notes (Signed)
Patient arrived around 0130 she is in pain attending nhas been contacted awaiting orders. Will monitor.

## 2014-02-27 DIAGNOSIS — K921 Melena: Secondary | ICD-10-CM

## 2014-02-27 LAB — RETICULOCYTES
RBC.: 2.66 MIL/uL — ABNORMAL LOW (ref 3.87–5.11)
Retic Count, Absolute: 449.5 10*3/uL — ABNORMAL HIGH (ref 19.0–186.0)
Retic Ct Pct: 16.9 % — ABNORMAL HIGH (ref 0.4–3.1)

## 2014-02-27 LAB — CLOSTRIDIUM DIFFICILE BY PCR: Toxigenic C. Difficile by PCR: NEGATIVE

## 2014-02-27 LAB — CBC
HEMATOCRIT: 25.4 % — AB (ref 36.0–46.0)
HEMOGLOBIN: 9.2 g/dL — AB (ref 12.0–15.0)
MCH: 34.6 pg — ABNORMAL HIGH (ref 26.0–34.0)
MCHC: 36.2 g/dL — AB (ref 30.0–36.0)
MCV: 95.5 fL (ref 78.0–100.0)
Platelets: 691 10*3/uL — ABNORMAL HIGH (ref 150–400)
RBC: 2.66 MIL/uL — ABNORMAL LOW (ref 3.87–5.11)
RDW: 18.3 % — AB (ref 11.5–15.5)
WBC: 15 10*3/uL — ABNORMAL HIGH (ref 4.0–10.5)

## 2014-02-27 MED ORDER — IOHEXOL 300 MG/ML  SOLN
25.0000 mL | INTRAMUSCULAR | Status: AC
  Administered 2014-02-27 (×2): 25 mL via ORAL

## 2014-02-27 MED ORDER — HYDROMORPHONE HCL PF 1 MG/ML IJ SOLN
1.0000 mg | INTRAMUSCULAR | Status: DC | PRN
  Administered 2014-02-27 – 2014-02-28 (×10): 1 mg via INTRAVENOUS
  Filled 2014-02-27 (×10): qty 1

## 2014-02-27 MED ORDER — OXYCODONE HCL 5 MG PO TABS
5.0000 mg | ORAL_TABLET | ORAL | Status: DC | PRN
  Administered 2014-02-27 – 2014-03-01 (×5): 5 mg via ORAL
  Filled 2014-02-27 (×6): qty 1

## 2014-02-27 MED ORDER — SODIUM CHLORIDE 0.9 % IV BOLUS (SEPSIS)
500.0000 mL | Freq: Once | INTRAVENOUS | Status: AC
  Administered 2014-02-27: 500 mL via INTRAVENOUS

## 2014-02-27 NOTE — Progress Notes (Signed)
Family Medicine Teaching Service Daily Progress Note Intern Pager: [phone]  Patient name: [name] Medical record number: [id number] Date of birth: Jan 08, 1996 Age: 18 y.o. Gender: female  Primary Care Provider: Garret Reddish, MD Consultants: None Code Status: Full code  Pt Overview and Major Events to Date:  4/28- admitted for back pain  5/1- still having bloody stools   ABX/Culture Flagyl (4/28)>> CTX (4/30)>>  Urine cx (4/30)>> Stool cx (need to be collected)>>  Assessment and Plan: [name] is a 18 y.o. female presenting with severe back pain . PMH is significant for sickle cell disease (HgSS)  # Back pain: reticulocytes trending up 16.4>16.9 but Hgb improving. Patient still complaining of pain.  - Pain control - Dilaudid 1mg  q2hrs PRN, received 6 times in last 24 hours  - Toradol 15mg  IV QID (4/30); stopped today in light of getting CT ab/pelvis  - Oxy IR 5 mg Q4  - Senna-docusate 2 BID for constipation - Routine pulse oximetry - Incentive spirometry - Folic acid - Daily CBC and retic - Consider transfusion if patient hgb trends below 7 - Monitor for development of acute chest (fevers, chest pain, O2 requirement); will need chest x-ray if any new respiratory symptoms  # Pyuria:  Afebrile  - CTX empirically  - f/u urine cx  - Will consider imaging if pain is not controlled with current regimen.    # Bacterial vaginosis: diagnosed at recent office visit - Continue metronidazole 500mg  BID  # Sickle cell disease s/p splenectomy: patient has had previous admissions where she has left against medical advice when not given a pain regimen she felt was appropriate (from chart review only) - Patient will need follow-up. Appears she was referred to [facility] but never followed through with them. Can address this with patient before discharge  # Bright red blood per rectum:  FOBT positive at [facility]. Rectal exam in ED was negative for internal or external  hemorrhoids, no palpable stool ball, no fissure, no evidence of rectal abscess, no gross blood or hemorrhage. Patient still having bright red blood in her stool this AM.  - C. Diff and Gi path panel: pending  - CT ab/pelvis: pending   - Urine preg: negative  - stool culture needs to be collected.  - Bowel regimen  - bolus 500 mL x 1 today   FEN/GI: Regular diet, saline lock IV to minimize fluid overload and potentially pushing patient to acute chest Prophylaxis: heparin subq  Disposition: Home pending better control of pain  Subjective: patient still having pain in her back and buttocks. She doesn't know where she usually has a pain crisis. Discussed her UDS with her. 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 She is not a regular user.   Objective: Temp:  [97.8 F (36.6 C)-98.4 F (36.9 C)] 98.1 F (36.7 C) (05/01 0621) Pulse Rate:  [63-71] 66 (05/01 0621) Resp:  [16] 16 (05/01 0621) BP: (102-115)/(47-63) 115/63 mmHg (05/01 0621) SpO2:  [98 %] 98 % (05/01 9518)  Physical Exam: General: Sitting up in bed. Alert  Cardiovascular: Regular rate and rhythm, no murmur Respiratory: Clear to auscultation bilaterally Abdomen: Soft, non-tender, non-distended Extremities: No calf tenderness Musculoskeletal: diffuse lumbar back and posterior hip tenderness  Laboratory:  Recent Labs Lab 02/26/14 0645 02/26/14 1719 02/27/14 0455  WBC 15.9* 16.2* 15.0*  HGB 8.0* 9.1* 9.2*  HCT 22.9* 25.3* 25.4*  PLT 608* 709* 691*    Recent Labs Lab 02/25/14 2035 02/26/14  0645  NA 137 138  K 3.7 3.7  CL 98 102  CO2 24 24  BUN 5* 6  CREATININE 0.74 0.73  CALCIUM 10.0 9.1  PROT 8.0 7.0  BILITOT 4.0* 4.5*  ALKPHOS 68 56  ALT 15 12  AST 18 16  GLUCOSE 91 87     Retic Ct Pct  Date Value Ref Range Status  02/27/2014 16.9* 0.4 - 3.1 % Final   Retic Count, Manual  Date Value Ref Range Status  02/27/2014 449.5* 19.0 - 186.0 K/uL Final   Drugs of Abuse     Component Value  Date/Time   LABOPIA POSITIVE* 02/26/2014 0825   COCAINSCRNUR POSITIVE* 02/26/2014 0825   COCAINSCRNUR NEG 03/25/2013 0950   LABBENZ NONE DETECTED 02/26/2014 0825   LABBENZ NEG 03/25/2013 0950   AMPHETMU NONE DETECTED 02/26/2014 0825   AMPHETMU NEG 03/25/2013 0950   THCU POSITIVE* 02/26/2014 0825   THCU 273 03/25/2013 0950   LABBARB NONE DETECTED 02/26/2014 0825      Imaging/Diagnostic Tests:  No results found.   Rosemarie Ax, MD 02/27/2014, 8:34 AM PGY-1, [place] Intern pager: [phone], text pages welcome

## 2014-02-27 NOTE — Progress Notes (Signed)
FMTS ATTENDING  NOTE Dantavious Snowball,MD I  have seen and examined this patient, reviewed their chart. I have discussed this patient with the resident. I agree with the resident's findings, assessment and care plan. 

## 2014-02-28 ENCOUNTER — Inpatient Hospital Stay (HOSPITAL_COMMUNITY): Payer: Medicaid Other

## 2014-02-28 ENCOUNTER — Encounter (HOSPITAL_COMMUNITY): Payer: Self-pay | Admitting: Radiology

## 2014-02-28 LAB — URINE CULTURE

## 2014-02-28 LAB — CBC
HCT: 25.3 % — ABNORMAL LOW (ref 36.0–46.0)
HEMOGLOBIN: 9.1 g/dL — AB (ref 12.0–15.0)
MCH: 34.1 pg — ABNORMAL HIGH (ref 26.0–34.0)
MCHC: 36 g/dL (ref 30.0–36.0)
MCV: 94.8 fL (ref 78.0–100.0)
Platelets: 679 10*3/uL — ABNORMAL HIGH (ref 150–400)
RBC: 2.67 MIL/uL — AB (ref 3.87–5.11)
RDW: 17.6 % — ABNORMAL HIGH (ref 11.5–15.5)
WBC: 11.4 10*3/uL — ABNORMAL HIGH (ref 4.0–10.5)

## 2014-02-28 LAB — RETICULOCYTES
RBC.: 2.67 MIL/uL — ABNORMAL LOW (ref 3.87–5.11)
Retic Count, Absolute: 395.2 10*3/uL — ABNORMAL HIGH (ref 19.0–186.0)
Retic Ct Pct: 14.8 % — ABNORMAL HIGH (ref 0.4–3.1)

## 2014-02-28 MED ORDER — DOCUSATE SODIUM 100 MG PO CAPS
100.0000 mg | ORAL_CAPSULE | Freq: Two times a day (BID) | ORAL | Status: DC | PRN
  Administered 2014-02-28 – 2014-03-02 (×3): 100 mg via ORAL
  Filled 2014-02-28 (×3): qty 1

## 2014-02-28 MED ORDER — HYDROMORPHONE HCL PF 1 MG/ML IJ SOLN
2.0000 mg | INTRAMUSCULAR | Status: DC | PRN
  Administered 2014-02-28 – 2014-03-01 (×10): 2 mg via INTRAVENOUS
  Filled 2014-02-28 (×11): qty 2

## 2014-02-28 MED ORDER — IOHEXOL 300 MG/ML  SOLN
100.0000 mL | Freq: Once | INTRAMUSCULAR | Status: AC | PRN
  Administered 2014-02-28: 100 mL via INTRAVENOUS

## 2014-02-28 NOTE — Progress Notes (Signed)
Patient been rude to RN because she could not have her pain medicine (dilaudid) at the time she called.

## 2014-02-28 NOTE — Progress Notes (Signed)
Family Medicine Teaching Service Daily Progress Note Intern Pager: [phone]  Patient name: [name] Medical record number: [id number] Date of birth: 02/06/1996 Age: 18 y.o. Gender: female  Primary Care Provider: Garret Reddish, MD Consultants: None Code Status: Full code  Pt Overview and Major Events to Date:  4/28- admitted for back pain  5/1- still having bloody stools  5/2 - new murmur noted on exam  ABX/Culture Flagyl (4/28)>> CTX (4/30)>>  Urine cx (4/30)>>20K colonies e coli pan sensitive Stool cx (5/1)>> GI pathogen panel 5/1 >>  Assessment and Plan: [name] is a 18 y.o. female presenting with severe back pain . PMH is significant for sickle cell disease (HgSS)  # Back pain: reticulocytes trending 16.4>16.9>14.8 but Hgb stable. Patient still complaining of pain.  - Pain control - Dilaudid 1mg  q2hrs PRN, received q2h. Not helping pain. Will increase to 2mg  q2hprn - Toradol 15mg  IV QID (4/30); stopped 5/1 in light of getting CT ab/pelvis and rectal bleeding - Oxy IR 5 mg Q4 prn - Senna-docusate 2 BID for constipation - Routine pulse oximetry - Incentive spirometry - Folic acid - Daily CBC and retic - Consider transfusion if patient hgb trends below 7 - Monitor for development of acute chest (fevers, chest pain, O2 requirement); will need chest x-ray if any new respiratory symptoms [ ]  Given continued pain along with rectal bleeding, have ordered CT abd/pelvis with contrast to rule out other causes  # Heart murmur: newly diagnosed on 5/2. Hgb stable, afebrile so no immediate obvious cause - consider echo, will d/w attending  # Pyuria:  Afebrile  - urine culture with 20K colonies of e coli - will likely stop CTX once CT abd comes back  # Bacterial vaginosis: diagnosed at recent office visit - Continue metronidazole 500mg  BID  # Sickle cell disease s/p splenectomy: patient has had previous admissions where she has left against medical  advice when not given a pain regimen she felt was appropriate (from chart review only) - Patient will need follow-up. Appears she was referred to [facility] but never followed through with them. Can address this with patient before discharge  # Bright red blood per rectum:  FOBT positive at [facility]. Rectal exam in ED was negative for internal or external hemorrhoids, no palpable stool ball, no fissure, no evidence of rectal abscess, no gross blood or hemorrhage. Patient still having bright red blood in her stool - C. Diff negative  - GI path panel and stool culture pending  - CT ab/pelvis: pending   - Urine preg: negative  - Bowel regimen as needed  FEN/GI: Regular diet, saline lock IV to minimize fluid overload and potentially pushing patient to acute chest Prophylaxis: heparin subq  Disposition: Home pending better control of pain and result of CT scan  Subjective: pt still c/o of pain in her low back and buttock area. Dislikes having to drink IV contrasts but understands importance of it. States that she's been eating and drinking okay. Still having rectal bleeding. Pain medication is not helping, would like increase in medicine.  Objective: Temp:  [98.3 F (36.8 C)-98.5 F (36.9 C)] 98.5 F (36.9 C) (05/02 0535) Pulse Rate:  [69-75] 75 (05/02 0535) Resp:  [16-18] 18 (05/02 0535) BP: (118-129)/(63-78) 118/63 mmHg (05/02 0535) SpO2:  [96 %-98 %] 98 % (05/02 0535)  Physical Exam: General: Sitting up in bed. Alert  Cardiovascular: Regular rate and rhythm, 3/6 systolic murmur loudest at LUSB Respiratory: Clear to auscultation bilaterally via anterior auscultation  Abdomen: Soft, non-tender, non-distended, +BS Extremities: No calf tenderness or lower ext edema Neuro: speech normal, alert and oriented  Laboratory:  Recent Labs Lab 02/26/14 1719 02/27/14 0455 02/28/14 0438  WBC 16.2* 15.0* 11.4*  HGB 9.1* 9.2* 9.1*  HCT 25.3* 25.4* 25.3*  PLT 709* 691* 679*    Recent Labs Lab  02/25/14 2035 02/26/14 0645  NA 137 138  K 3.7 3.7  CL 98 102  CO2 24 24  BUN 5* 6  CREATININE 0.74 0.73  CALCIUM 10.0 9.1  PROT 8.0 7.0  BILITOT 4.0* 4.5*  ALKPHOS 68 56  ALT 15 12  AST 18 16  GLUCOSE 91 87     Retic Ct Pct  Date Value Ref Range Status  02/28/2014 14.8* 0.4 - 3.1 % Final   Retic Count, Manual  Date Value Ref Range Status  02/28/2014 395.2* 19.0 - 186.0 K/uL Final   Drugs of Abuse     Component Value Date/Time   LABOPIA POSITIVE* 02/26/2014 0825   COCAINSCRNUR POSITIVE* 02/26/2014 0825   COCAINSCRNUR NEG 03/25/2013 0950   LABBENZ NONE DETECTED 02/26/2014 0825   LABBENZ NEG 03/25/2013 0950   AMPHETMU NONE DETECTED 02/26/2014 0825   AMPHETMU NEG 03/25/2013 0950   THCU POSITIVE* 02/26/2014 0825   THCU 273 03/25/2013 0950   LABBARB NONE DETECTED 02/26/2014 0825      Imaging/Diagnostic Tests:  No results found.   Leeanne Rio, MD 02/28/2014, 9:43 AM PGY-2, [place] Intern pager: [phone], text pages welcome

## 2014-02-28 NOTE — Progress Notes (Signed)
FMTS Attending Note  I personally saw and evaluated the patient. The plan of care was discussed with the resident team. I agree with the assessment and plan as documented by the resident.   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 with sickle cell disease admitted for back pain. Pain remains uncontrolled despite MS Contin, Oxycodone, and PRN Dilauded. Cause of pain is unclear. Differential at this time includes sickle cell pain crisis vs constipation (4 day history of of constipation at time of admission) vs colitis (paitent now has bloody stools). Patient scheduled to have CT ABD/Pelvis completed today to further evaluate her abdomen. Urine culture negative for UTI. Vaginal cultures negative for GC/Chlam. Currently receiving treatment for BV which is unlikely to be causing her symptoms. Awaiting stool studies as well.   Cardiac murmur present at time of admission, likely flow murmur given anemia. Patient reports no chest pain/lightheadedness/syncope. No echo at this time. Consider as an outpatient.   Dossie Arbour MD

## 2014-03-01 LAB — CBC
HCT: 24.5 % — ABNORMAL LOW (ref 36.0–46.0)
Hemoglobin: 8.9 g/dL — ABNORMAL LOW (ref 12.0–15.0)
MCH: 34.1 pg — ABNORMAL HIGH (ref 26.0–34.0)
MCHC: 36.3 g/dL — ABNORMAL HIGH (ref 30.0–36.0)
MCV: 93.9 fL (ref 78.0–100.0)
PLATELETS: 575 10*3/uL — AB (ref 150–400)
RBC: 2.61 MIL/uL — AB (ref 3.87–5.11)
RDW: 18.5 % — ABNORMAL HIGH (ref 11.5–15.5)
WBC: 15.5 10*3/uL — AB (ref 4.0–10.5)

## 2014-03-01 LAB — BASIC METABOLIC PANEL WITH GFR
BUN: 4 mg/dL — ABNORMAL LOW (ref 6–23)
CO2: 23 meq/L (ref 19–32)
Calcium: 9.2 mg/dL (ref 8.4–10.5)
Chloride: 97 meq/L (ref 96–112)
Creatinine, Ser: 0.65 mg/dL (ref 0.50–1.10)
GFR calc Af Amer: >90 mL/min
GFR calc non Af Amer: >90 mL/min
Glucose, Bld: 94 mg/dL (ref 70–99)
Potassium: 3.8 meq/L (ref 3.7–5.3)
Sodium: 135 meq/L — ABNORMAL LOW (ref 137–147)

## 2014-03-01 LAB — RETICULOCYTES
RBC.: 2.61 MIL/uL — ABNORMAL LOW (ref 3.87–5.11)
RETIC COUNT ABSOLUTE: 490.7 10*3/uL — AB (ref 19.0–186.0)
RETIC CT PCT: 18.8 % — AB (ref 0.4–3.1)

## 2014-03-01 MED ORDER — HYDROMORPHONE HCL PF 1 MG/ML IJ SOLN
1.0000 mg | Freq: Once | INTRAMUSCULAR | Status: AC
  Administered 2014-03-01: 1 mg via INTRAVENOUS

## 2014-03-01 MED ORDER — HYDROMORPHONE HCL PF 1 MG/ML IJ SOLN
1.0000 mg | INTRAMUSCULAR | Status: DC | PRN
  Administered 2014-03-01 – 2014-03-02 (×7): 1 mg via INTRAVENOUS
  Filled 2014-03-01 (×9): qty 1

## 2014-03-01 MED ORDER — NICOTINE 14 MG/24HR TD PT24
14.0000 mg | MEDICATED_PATCH | Freq: Every day | TRANSDERMAL | Status: DC
  Administered 2014-03-01 – 2014-03-02 (×2): 14 mg via TRANSDERMAL
  Filled 2014-03-01 (×3): qty 1

## 2014-03-01 MED ORDER — HYDROMORPHONE HCL 2 MG PO TABS
4.0000 mg | ORAL_TABLET | ORAL | Status: DC
  Administered 2014-03-01 – 2014-03-02 (×8): 4 mg via ORAL
  Filled 2014-03-01 (×8): qty 2

## 2014-03-01 MED ORDER — METRONIDAZOLE 500 MG PO TABS
500.0000 mg | ORAL_TABLET | Freq: Three times a day (TID) | ORAL | Status: DC
  Administered 2014-03-01 – 2014-03-02 (×4): 500 mg via ORAL
  Filled 2014-03-01 (×6): qty 1

## 2014-03-01 MED ORDER — CIPROFLOXACIN HCL 500 MG PO TABS
500.0000 mg | ORAL_TABLET | Freq: Two times a day (BID) | ORAL | Status: DC
Start: 2014-03-01 — End: 2014-03-02
  Administered 2014-03-01 – 2014-03-02 (×3): 500 mg via ORAL
  Filled 2014-03-01 (×5): qty 1

## 2014-03-01 NOTE — Progress Notes (Signed)
Family Medicine Teaching Service Daily Progress Note Intern Pager: [phone]  Patient name: [name] Medical record number: [id number] Date of birth: 03-11-1996 Age: 18 y.o. Gender: female  Primary Care Provider: Garret Reddish, MD Consultants: None Code Status: Full code  Pt Overview and Major Events to Date:  4/28- admitted for back pain  5/1- still having bloody stools  5/2 - new murmur noted on exam  ABX/Culture CIPRO (5/3) for Colitis >>  Flagyl BID (4/28) for BV >> 5/3 >>> TID for colitis >>>   CTX (4/30) for ?UTI >> 5/3   Urine cx (4/30)>>20K colonies e coli pan sensitive Stool cx (5/1)>> GI pathogen panel 5/1 >>  Assessment and Plan: [name] is a 18 y.o. female presenting with severe back pain . PMH is significant for sickle cell disease (HgSS)  # Back pain, likely Sickle Cell Pain Crisis: reticulocytes trending 16.4>16.9>14.8>11.4>15.5. Patient still complaining of pain.  - Change Pain control today: - Dilaudid 4mg  po q4hrs scheduled.  + 1mg  IV q3o prn for breakthrough. - s/p toradol  - d/c oxy - plan to d/c home on Dilaudid PO - Monitor for development of acute chest (fevers, chest pain, O2 requirement);  - KPad  - Instructed to use IS appropriately  # ID: Pyuria, newly Dx Colitis, Bacterial Vaginosis & BRBPR: CT with evidence of Colitis.  Persistent diarrhea.  - hemoglobin stable  - GC/Chlamydia neg on Urine but must consider if truly neg given unclear if dirty catch urine.  Low yield at this point given s/p Rocephin but if not improved consider recheck with vaginal swab  - Change ABX to: Flagyl tid; Cipro bid  # Heart murmur: newly diagnosed on 5/2. Hgb stable, afebrile so no immediate obvious cause - consider echo, will d/w attending  # Sickle cell disease s/p splenectomy: patient has had previous admissions where she has left against medical advice when not given a pain regimen she felt was appropriate (from chart review only) -  Patient will need follow-up. Appears she was referred to [facility] but never followed through with them.   FEN/GI: Regular diet, saline lock IV to minimize fluid overload and potentially pushing patient to acute chest Prophylaxis: heparin subq  Disposition: Home pending better control of pain and result of CT scan  Subjective: pt still c/o of pain in her low back and buttock area.  Unsure if able to transition to PO but willing to try.    Objective: Temp:  [98 F (36.7 C)-99.1 F (37.3 C)] 98 F (36.7 C) (05/03 0609) Pulse Rate:  [76-82] 80 (05/03 0609) Resp:  [20] 20 (05/03 0609) BP: (118-124)/(72-81) 124/78 mmHg (05/03 0609) SpO2:  [96 %-98 %] 98 % (05/03 0609)  Physical Exam: General: Sitting up in bed. Alert  Cardiovascular: Regular rate and rhythm, 26 systolic murmur loudest at LUSB Respiratory: Clear to auscultation bilaterally via anterior auscultation Abdomen: Soft, non-tender, non-distended, +BS Extremities: No calf tenderness or lower ext edema Neuro: speech normal, alert and oriented  Laboratory:  Recent Labs Lab 02/27/14 0455 02/28/14 0438 03/01/14 0719  WBC 15.0* 11.4* 15.5*  HGB 9.2* 9.1* 8.9*  HCT 25.4* 25.3* 24.5*  PLT 691* 679* 575*    Recent Labs Lab 02/25/14 2035 02/26/14 0645  NA 137 138  K 3.7 3.7  CL 98 102  CO2 24 24  BUN 5* 6  CREATININE 0.74 0.73  CALCIUM 10.0 9.1  PROT 8.0 7.0  BILITOT 4.0* 4.5*  ALKPHOS 68 56  ALT 15 12  AST 18 16  GLUCOSE 91 87     Retic Ct Pct  Date Value Ref Range Status  03/01/2014 18.8* 0.4 - 3.1 % Final   Retic Count, Manual  Date Value Ref Range Status  03/01/2014 490.7* 19.0 - 186.0 K/uL Final   Drugs of Abuse     Component Value Date/Time   LABOPIA POSITIVE* 02/26/2014 0825   COCAINSCRNUR POSITIVE* 02/26/2014 0825   COCAINSCRNUR NEG 03/25/2013 0950   LABBENZ NONE DETECTED 02/26/2014 0825   LABBENZ NEG 03/25/2013 0950   AMPHETMU NONE DETECTED 02/26/2014 0825   AMPHETMU NEG 03/25/2013 0950   THCU  POSITIVE* 02/26/2014 0825   THCU 273 03/25/2013 0950   LABBARB NONE DETECTED 02/26/2014 0825      Imaging/Diagnostic Tests:  Ct Abdomen Pelvis W Contrast  02/28/2014   CLINICAL DATA:  18 year old female with GI bleeding and abdominal and pelvic pain. Marland Kitchen  EXAM: CT ABDOMEN AND PELVIS WITH CONTRAST  TECHNIQUE: Multidetector CT imaging of the abdomen and pelvis was performed using the standard protocol following bolus administration of intravenous contrast.  CONTRAST:  159mL OMNIPAQUE IOHEXOL 300 MG/ML  SOLN  COMPARISON:  04/19/2010 CT and 12/14/2010 ultrasound.  FINDINGS: Mild to moderate circumferential wall thickening of the descending and sigmoid colon identified compatible with colitis. There is no evidence of bowel obstruction, pneumoperitoneum or abscess.  The liver, pancreas, kidneys and adrenal glands are unremarkable.  Cholelithiasis identified without CT evidence of acute cholecystitis.  The spleen is not identified.  There is no evidence of free fluid, enlarged lymph nodes, biliary dilation or abdominal aortic aneurysm.  No other bowel or bladder abnormalities are identified.  The appendix is normal.  No acute or suspicious bony abnormalities are present.  IMPRESSION: Colitis involving the descending and sigmoid colon - likely infectious/inflammatory. No evidence of bowel obstruction, abscess or pneumoperitoneum.  Cholelithiasis without CT evidence of acute cholecystitis.   Electronically Signed   By: Hassan Rowan M.D.   On: 02/28/2014 17:31     Gerda Diss, DO 03/01/2014, 8:48 AM PGY-2, [place] Intern pager: [phone], text pages welcome

## 2014-03-01 NOTE — Progress Notes (Signed)
Patient complaining of a leaking IV: IV flushed and assessed and no leak found however patient wants a new IV. Per charge RN, she attempted IV x2 with no success and has paged IV team. Will cont to monitor

## 2014-03-01 NOTE — Progress Notes (Signed)
Patient sitting on toilet, bent over, crying, complaining of back pain unrelieved by PO Dilaudid given at 2000 and 1mg  IV Dilaudid given at 2041.  She states her pain is getting worse.  Paged MD.

## 2014-03-01 NOTE — Progress Notes (Signed)
FMTS Attending Note  I personally saw and evaluated the patient. The plan of care was discussed with the resident team. I agree with the assessment and plan as documented by the resident.   Pain in back improved this AM, patient reports multiple loose/watery stools overnight. Stool studies pending, CT abd/pelvis consistent with colitis. Will add Ciprofloxacin to current Flagyl. Stop Rocephin. Wean pain medications as tolerated.   Dossie Arbour MD

## 2014-03-01 NOTE — Progress Notes (Signed)
K-pad set up and given to patient to help with pain. Also gave patient incentive spirometer and instructions for use which patient stated understanding of and demonstrated back to me. Will cont to monitor

## 2014-03-02 DIAGNOSIS — A09 Infectious gastroenteritis and colitis, unspecified: Secondary | ICD-10-CM

## 2014-03-02 LAB — GI PATHOGEN PANEL BY PCR, STOOL
C DIFFICILE TOXIN A/B: NEGATIVE
CAMPYLOBACTER BY PCR: NEGATIVE
Cryptosporidium by PCR: NEGATIVE
E COLI (ETEC) LT/ST: NEGATIVE
E COLI (STEC): NEGATIVE
E COLI 0157 BY PCR: NEGATIVE
G lamblia by PCR: NEGATIVE
Norovirus GI/GII: NEGATIVE
Rotavirus A by PCR: NEGATIVE
SALMONELLA BY PCR: NEGATIVE
Shigella by PCR: NEGATIVE

## 2014-03-02 LAB — RETICULOCYTES
RBC.: 2.69 MIL/uL — ABNORMAL LOW (ref 3.87–5.11)
RETIC COUNT ABSOLUTE: 594.5 10*3/uL — AB (ref 19.0–186.0)
Retic Ct Pct: 22.1 % — ABNORMAL HIGH (ref 0.4–3.1)

## 2014-03-02 LAB — CBC
HCT: 25.7 % — ABNORMAL LOW (ref 36.0–46.0)
Hemoglobin: 9.1 g/dL — ABNORMAL LOW (ref 12.0–15.0)
MCH: 33.8 pg (ref 26.0–34.0)
MCHC: 35.4 g/dL (ref 30.0–36.0)
MCV: 95.5 fL (ref 78.0–100.0)
PLATELETS: 657 10*3/uL — AB (ref 150–400)
RBC: 2.69 MIL/uL — ABNORMAL LOW (ref 3.87–5.11)
RDW: 19.3 % — AB (ref 11.5–15.5)
WBC: 15.5 10*3/uL — AB (ref 4.0–10.5)

## 2014-03-02 MED ORDER — OXYCODONE HCL 5 MG PO TABS: 5.0000 mg | ORAL_TABLET | ORAL | Status: DC | PRN

## 2014-03-02 MED ORDER — CIPROFLOXACIN HCL 500 MG PO TABS: 500.0000 mg | ORAL_TABLET | Freq: Two times a day (BID) | ORAL | Status: DC

## 2014-03-02 MED ORDER — ACETAMINOPHEN 325 MG PO TABS
650.0000 mg | ORAL_TABLET | Freq: Four times a day (QID) | ORAL | Status: DC
  Administered 2014-03-02: 650 mg via ORAL
  Filled 2014-03-02: qty 2

## 2014-03-02 MED ORDER — ACETAMINOPHEN 325 MG PO TABS: 650.0000 mg | ORAL_TABLET | Freq: Four times a day (QID) | ORAL | Status: DC | PRN

## 2014-03-02 MED ORDER — MORPHINE SULFATE ER 15 MG PO TBCR
15.0000 mg | EXTENDED_RELEASE_TABLET | Freq: Two times a day (BID) | ORAL | Status: DC
  Administered 2014-03-02: 15 mg via ORAL
  Filled 2014-03-02: qty 1

## 2014-03-02 NOTE — Discharge Summary (Signed)
[facility] Discharge Summary  Patient name: [name] Medical record number: [id number] Date of birth: [birth year] Age: 18 y.o. Gender: female Date of Admission: 02/25/2014  Date of Discharge: 03/02/14 Admitting Physician: Lupita Dawn, MD  Primary Care Provider: Garret Reddish, MD Consultants: none  Indication for Hospitalization: back pain   Discharge Diagnoses/Problem List:  Patient Active Problem List   Diagnosis Date Noted  . Hematochezia 02/27/2014  . UTI (lower urinary tract infection) 02/26/2014  . Vaginal discharge 02/24/2014  . Sickle cell pain crisis 11/23/2013  . Facial rash 03/25/2013  . Birth control counseling 08/14/2012  . Asthma, mild intermittent 12/14/2010  . Hb-SS disease with crisis 10/12/2009   Disposition: home  Discharge Condition: improved   Discharge Exam:  General: Sitting up in bed. Alert  Cardiovascular: Regular rate and rhythm, 2/6 systolic murmur loudest at LUSB  Respiratory: Clear to auscultation bilaterally via anterior auscultation  Extremities: No calf tenderness or lower ext edema  Neuro: speech normal, alert and oriented  Brief Hospital Course:  [name] is a 18 y.o. female presenting with severe back pain . PMH is significant for sickle cell disease (HgSS)   # Back pain: Patient presenting with back and buttock pain. She had an increase in her reticulocytes and stable Hgb (9.4). Thought to be initially to be associated with a acute pain crisis but she was found to have colitis on CT. Patient treated with ABX and pain improved. She felt that her pain had improved and wanted to be discharged. She was discharged on Oxy 5 mg as needed for pain, while avoiding NSAIDS as she had BRBPR.   # ID: Pyuria, newly Dx Colitis, Bacterial Vaginosis & BRBPR: Patient was seen in clinic on 4/28 and found to have BV. She was started on a 7 day course of flagyl 500 mg BID.  She was having an episode of  BRBPR prior to admission. She was having diarrhea and found FOTB + at [facility]. Rectal exam in ED was negative for internal or external hemorrhoids, no palpable stool ball, no fissure, no evidence of rectal abscess, no gross blood or hemorrhage. Diarrhea and hematochezia continued so a CT abdomen/pelvis was performed. CT was significant for colitis involving the descending and sigmoid colon - likely infectious/inflammatory. C. Diff and GI path panel were negative and stool culture negative for suspicious colonies.  She was started on Cipro and flagyl was transitioned to TID dosing. She improved prior to discharge on sent home on a course of cipro and flagyl.   # Heart murmur: most likely a flow murmur   # Sickle cell disease s/p splenectomy: patient has had previous admissions where she has left against medical advice when not given a pain regimen she felt was appropriate (from chart review only) Patient will need follow-up. Appears she was referred to [facility] but never followed through with them.   Issues for Follow Up:  1. Sickle cell disease: Patient will need follow-up. Appears she was referred to [facility] but never followed through with them. 2. UDS: positive for cocaine, opiates and THC. Current home pain regimen didn't include any narcotics. Discharge on oxy IR. Will need UDS prior to be prescribed any further narcotics.  3. Colitis: discharged on ABX. Will need f/u if still having BRBPR or any weight loss. Possible need for colonoscopy/endoscopy going forward.   Significant Procedures: none  Significant Labs and Imaging:   Recent Labs Lab 02/28/14 0438 03/01/14 0719 03/02/14 0420  WBC 11.4*  15.5* 15.5*  HGB 9.1* 8.9* 9.1*  HCT 25.3* 24.5* 25.7*  PLT 679* 575* 657*    Recent Labs Lab 02/25/14 2035 02/26/14 0645 03/01/14 0719  NA 137 138 135*  K 3.7 3.7 3.8  CL 98 102 97  CO2 24 24 23   GLUCOSE 91 87 94  BUN 5* 6 4*  CREATININE 0.74 0.73 0.65  CALCIUM 10.0 9.1 9.2  MG  --  1.8  --    ALKPHOS 68 56  --   AST 18 16  --   ALT 15 12  --   ALBUMIN 4.4 3.7  --     Urinalysis    Component Value Date/Time   COLORURINE YELLOW 02/25/2014 2054   APPEARANCEUR CLOUDY* 02/25/2014 2054   LABSPEC 1.011 02/25/2014 2054   PHURINE 6.0 02/25/2014 2054   GLUCOSEU NEGATIVE 02/25/2014 2054   HGBUR SMALL* 02/25/2014 2054   BILIRUBINUR NEGATIVE 02/25/2014 2054   [place] NEGATIVE 02/25/2014 2054   PROTEINUR NEGATIVE 02/25/2014 2054   UROBILINOGEN 1.0 02/25/2014 2054   NITRITE NEGATIVE 02/25/2014 2054   LEUKOCYTESUR LARGE* 02/25/2014 2054     Drugs of Abuse     Component Value Date/Time   LABOPIA POSITIVE* 02/26/2014 0825   COCAINSCRNUR POSITIVE* 02/26/2014 0825   COCAINSCRNUR NEG 03/25/2013 0950   LABBENZ NONE DETECTED 02/26/2014 0825   LABBENZ NEG 03/25/2013 0950   AMPHETMU NONE DETECTED 02/26/2014 0825   AMPHETMU NEG 03/25/2013 0950   THCU POSITIVE* 02/26/2014 0825   THCU 273 03/25/2013 0950   LABBARB NONE DETECTED 02/26/2014 0825      Results/Tests Pending at Time of Discharge: none  Discharge Medications:    Medication List         albuterol 108 (90 BASE) MCG/ACT inhaler  Commonly known as:  PROVENTIL HFA;VENTOLIN HFA  Inhale 2 puffs into the lungs every 4 (four) hours as needed for wheezing or shortness of breath. For shortness of breath     ciprofloxacin 500 MG tablet  Commonly known as:  CIPRO  Take 1 tablet (500 mg total) by mouth 2 (two) times daily.     fluconazole 150 MG tablet  Commonly known as:  DIFLUCAN  Take 1 tablet (150 mg total) by mouth once.     ibuprofen 200 MG tablet  Commonly known as:  ADVIL,MOTRIN  Take 800 mg by mouth every 6 (six) hours as needed for mild pain.     metroNIDAZOLE 500 MG tablet  Commonly known as:  FLAGYL  Take 1 tablet (500 mg total) by mouth 2 (two) times daily.     oxyCODONE 5 MG immediate release tablet  Commonly known as:  Oxy IR/ROXICODONE  Take 1 tablet (5 mg total) by mouth every 3 (three) hours as needed for severe  pain.        Discharge Instructions: Please refer to Patient Instructions section of EMR for full details.  Patient was counseled important signs and symptoms that should prompt return to medical care, changes in medications, dietary instructions, activity restrictions, and follow up appointments.   Follow-Up Appointments:     Follow-up Information   Follow up with Rosemarie Ax, MD. (Thursday 5/7 @ 2:30 PM )    Specialty:  Family Medicine   Contact information:   [place] [id number] [phone]       Rosemarie Ax, MD 03/02/2014, 10:39 PM PGY-1, New Site

## 2014-03-02 NOTE — Discharge Instructions (Signed)
Sickle Cell Anemia, Adult °Sickle cell anemia is a condition in which red blood cells have an abnormal "sickle" shape. This abnormal shape shortens the cells' life span, which results in a lower than normal concentration of red blood cells in the blood. The sickle shape also causes the cells to clump together and block free blood flow through the blood vessels. As a result, the tissues and organs of the body do not receive enough oxygen. Sickle cell anemia causes organ damage and pain and increases the risk of infection. °CAUSES  °Sickle cell anemia is a genetic disorder. Those who receive two copies of the gene have the condition, and those who receive one copy have the trait. °RISK FACTORS °The sickle cell gene is most common in people whose families originated in [place]. Other areas of the globe where sickle cell trait occurs include the Mediterranean, [place] and [place], the [place], and the [place].  °SIGNS AND SYMPTOMS °· Pain, especially in the extremities, back, chest, or abdomen (common). The pain may start suddenly or may develop following an illness, especially if there is dehydration. Pain can also occur due to overexertion or exposure to extreme temperature changes. °· Frequent severe bacterial infections, especially certain types of pneumonia and meningitis. °· Pain and swelling in the hands and feet. °· Decreased activity.   °· Loss of appetite.   °· Change in behavior. °· Headaches. °· Seizures. °· Shortness of breath or difficulty breathing. °· Vision changes. °· Skin ulcers. °Those with the trait may not have symptoms or they may have mild symptoms.  °DIAGNOSIS  °Sickle cell anemia is diagnosed with blood tests that demonstrate the genetic trait. It is often diagnosed during the newborn period, due to mandatory testing nationwide. A variety of blood tests, X-rays, CT scans, MRI scans, ultrasounds, and lung function tests may also be done to monitor the condition. °TREATMENT  °Sickle  cell anemia may be treated with: °· Medicines. You may be given pain medicines, antibiotic medicines (to treat and prevent infections) or medicines to increase the production of certain types of hemoglobin. °· Fluids. °· Oxygen. °· Blood transfusions. °HOME CARE INSTRUCTIONS  °· Drink enough fluid to keep your urine clear or pale yellow. Increase your fluid intake in hot weather and during exercise. °· Do not smoke. Smoking lowers oxygen levels in the blood.   °· Only take over-the-counter or prescription medicines for pain, fever, or discomfort as directed by your health care provider. °· Take antibiotics as directed by your health care provider. Make sure you finish them it even if you start to feel better.   °· Take supplements as directed by your health care provider.   °· Consider wearing a medical alert bracelet. This tells anyone caring for you in an emergency of your condition.   °· When traveling, keep your medical information, health care provider's names, and the medicines you take with you at all times.   °· If you develop a fever, do not take medicines to reduce the fever right away. This could cover up a problem that is developing. Notify your health care provider. °· Keep all follow-up appointments with your health care provider. Sickle cell anemia requires regular medical care. °SEEK MEDICAL CARE IF: ° You have a fever. °SEEK IMMEDIATE MEDICAL CARE IF:  °· You feel dizzy or faint.   °· You have new abdominal pain, especially on the left side near the stomach area.   °· You develop a persistent, often uncomfortable and painful penile erection (priapism). If this is not treated immediately it   will lead to impotence.   °· You have numbness your arms or legs or you have a hard time moving them.   °· You have a hard time with speech.   °· You have a fever or persistent symptoms for more than 2 3 days.   °· You have a fever and your symptoms suddenly get worse.   °· You have signs or symptoms of infection.  These include:   °· Chills.   °· Abnormal tiredness (lethargy).   °· Irritability.   °· Poor eating.   °· Vomiting.   °· You develop pain that is not helped with medicine.   °· You develop shortness of breath. °· You have pain in your chest.   °· You are coughing up pus-like or bloody sputum.   °· You develop a stiff neck. °· Your feet or hands swell or have pain. °· Your abdomen appears bloated. °· You develop joint pain. °MAKE SURE YOU: °· Understand these instructions. °· Will watch your child's condition. °· Will get help right away if your child is not doing well or gets worse. °Document Released: 01/24/2006 Document Revised: 08/06/2013 Document Reviewed: 05/28/2013 °Exit[employer]® Patient Information ©2014 [employer]. ° °

## 2014-03-02 NOTE — Progress Notes (Signed)
Family Medicine Teaching Service Daily Progress Note Intern Pager: [phone]  Patient name: [name] Medical record number: [id number] Date of birth: [birth year] Age: 18 y.o. Gender: female  Primary Care Provider: Garret Reddish, MD Consultants: None Code Status: Full code  Pt Overview and Major Events to Date:  4/28- admitted for back pain  5/1- still having bloody stools  5/2 - new murmur noted on exam, CT ab showing colitis   ABX/Culture CIPRO (5/3) for Colitis >>  Flagyl BID (4/28) for BV >> 5/3 >>> TID for colitis >>>   CTX (4/30) for ?UTI >> 5/3   Urine cx (4/30)>>20K colonies e coli pan sensitive Stool cx (5/1)>>reincubated for better growth  GI pathogen panel 5/1 >>  Assessment and Plan: [name] is a 18 y.o. female presenting with severe back pain . PMH is significant for sickle cell disease (HgSS)  # Back pain, likely Sickle Cell Pain Crisis: reticulocytes trending up. Patient still complaining of pain. Pain most likely from her colitis   - Change Pain control today: - Ms Contin 15 mg BID  - Oxy 10 mg PRN Q3 PRN  - plan to d/c home on Dilaudid PO - tylenol 650 Q6  - avoiding NSAIDS as she is having BRBPR - Monitor for development of acute chest (fevers, chest pain, O2 requirement);  - KPad  - Instructed to use IS appropriately  # ID: Pyuria, newly Dx Colitis, Bacterial Vaginosis & BRBPR: CT with evidence of Colitis.  Persistent diarrhea. - hemoglobin stable - Flagyl tid; Cipro bid - C. Diff: negative   # Heart murmur: most likely a flow murmur   # Sickle cell disease s/p splenectomy: patient has had previous admissions where she has left against medical advice when not given a pain regimen she felt was appropriate (from chart review only) - Patient will need follow-up. Appears she was referred to [facility] but never followed through with them.   FEN/GI: Regular diet, saline lock IV to minimize fluid overload and potentially pushing patient  to acute chest Prophylaxis: heparin subq  Disposition: Home pending better control of pain   Subjective: Patient complaining of pain this AM. She hasn't had any blood in her stools. She is still having loose stools.     Objective: Temp:  [97.8 F (36.6 C)-98.8 F (37.1 C)] 98.6 F (37 C) (05/04 0517) Pulse Rate:  [85-97] 85 (05/04 0517) Resp:  [20] 20 (05/04 0517) BP: (111-121)/(69-76) 112/70 mmHg (05/04 0517) SpO2:  [96 %-98 %] 96 % (05/04 0517)  Physical Exam: General: Sitting up in bed. Alert  Cardiovascular: Regular rate and rhythm, 2/6 systolic murmur loudest at LUSB Respiratory: Clear to auscultation bilaterally via anterior auscultation Extremities: No calf tenderness or lower ext edema Neuro: speech normal, alert and oriented  Laboratory:  Recent Labs Lab 02/28/14 0438 03/01/14 0719 03/02/14 0420  WBC 11.4* 15.5* 15.5*  HGB 9.1* 8.9* 9.1*  HCT 25.3* 24.5* 25.7*  PLT 679* 575* 657*    Recent Labs Lab 02/25/14 2035 02/26/14 0645 03/01/14 0719  NA 137 138 135*  K 3.7 3.7 3.8  CL 98 102 97  CO2 24 24 23   BUN 5* 6 4*  CREATININE 0.74 0.73 0.65  CALCIUM 10.0 9.1 9.2  PROT 8.0 7.0  --   BILITOT 4.0* 4.5*  --   ALKPHOS 68 56  --   ALT 15 12  --   AST 18 16  --   GLUCOSE 91 87 94     Retic  Ct Pct  Date Value Ref Range Status  03/02/2014 22.1* 0.4 - 3.1 % Final   Retic Count, Manual  Date Value Ref Range Status  03/02/2014 594.5* 19.0 - 186.0 K/uL Final   Drugs of Abuse     Component Value Date/Time   LABOPIA POSITIVE* 02/26/2014 0825   COCAINSCRNUR POSITIVE* 02/26/2014 0825   COCAINSCRNUR NEG 03/25/2013 0950   LABBENZ NONE DETECTED 02/26/2014 0825   LABBENZ NEG 03/25/2013 0950   AMPHETMU NONE DETECTED 02/26/2014 0825   AMPHETMU NEG 03/25/2013 0950   THCU POSITIVE* 02/26/2014 0825   THCU 273 03/25/2013 0950   LABBARB NONE DETECTED 02/26/2014 0825      Imaging/Diagnostic Tests:  CT ab/pelvis: Colitis involving the descending and sigmoid colon -  likely  infectious/inflammatory. No evidence of bowel obstruction, abscess or pneumoperitoneum.  Cholelithiasis without CT evidence of acute cholecystitis.    Rosemarie Ax, MD 03/02/2014, 7:35 AM PGY-1, [place] Intern pager: [phone], text pages welcome

## 2014-03-02 NOTE — Progress Notes (Signed)
FMTS Attending Note  The plan of care was discussed with the resident team. I agree with the assessment and plan as documented by the resident.   I did not personally examine this patient today as she was discharged prior to my examination.   Dossie Arbour MD

## 2014-03-02 NOTE — Progress Notes (Signed)
Patient was given discharge instructions and prescriptions. Patient stated understanding. IV was removed and patient belongings were gathered. Patient left via wheelchair in stable condition accompanied by volunteer. Burnell Blanks, RN

## 2014-03-02 NOTE — Progress Notes (Signed)
PCP Social Visit Note [place]. Melanee Spry, MD, PGY-3  I have seen patient and discussed current hospitalization. I agree with the excellent care provided by the primary team of [facility] and want to thank them for their continued efforts in caring for my patient. Patient is hopeful to be discharged today and states she is feeling much better.   Marin Olp, MD, PGY-3 03/02/2014 12:25 PM

## 2014-03-03 NOTE — Discharge Summary (Signed)
I agree with the discharge summary as documented.   Shearon Clonch MD  

## 2014-03-04 LAB — STOOL CULTURE

## 2014-03-05 ENCOUNTER — Inpatient Hospital Stay: Payer: Medicaid Other | Admitting: Family Medicine

## 2014-06-12 ENCOUNTER — Encounter (HOSPITAL_COMMUNITY): Payer: Self-pay | Admitting: Emergency Medicine

## 2014-06-12 ENCOUNTER — Emergency Department (HOSPITAL_COMMUNITY): Payer: Medicaid Other

## 2014-06-12 ENCOUNTER — Emergency Department (HOSPITAL_COMMUNITY)
Admission: EM | Admit: 2014-06-12 | Discharge: 2014-06-12 | Disposition: A | Discharge status: Home / Self Care | Payer: Medicaid Other | Attending: Emergency Medicine | Admitting: Emergency Medicine

## 2014-06-12 DIAGNOSIS — Z202 Contact with and (suspected) exposure to infections with a predominantly sexual mode of transmission: Secondary | ICD-10-CM | POA: Insufficient documentation

## 2014-06-12 DIAGNOSIS — F17200 Nicotine dependence, unspecified, uncomplicated: Secondary | ICD-10-CM | POA: Insufficient documentation

## 2014-06-12 DIAGNOSIS — D5700 Hb-SS disease with crisis, unspecified: Secondary | ICD-10-CM | POA: Diagnosis not present

## 2014-06-12 DIAGNOSIS — Z8742 Personal history of other diseases of the female genital tract: Secondary | ICD-10-CM | POA: Insufficient documentation

## 2014-06-12 DIAGNOSIS — J45909 Unspecified asthma, uncomplicated: Secondary | ICD-10-CM | POA: Insufficient documentation

## 2014-06-12 DIAGNOSIS — Z3202 Encounter for pregnancy test, result negative: Secondary | ICD-10-CM | POA: Diagnosis not present

## 2014-06-12 DIAGNOSIS — Z79899 Other long term (current) drug therapy: Secondary | ICD-10-CM | POA: Diagnosis not present

## 2014-06-12 DIAGNOSIS — K8020 Calculus of gallbladder without cholecystitis without obstruction: Secondary | ICD-10-CM | POA: Insufficient documentation

## 2014-06-12 DIAGNOSIS — D571 Sickle-cell disease without crisis: Secondary | ICD-10-CM | POA: Diagnosis present

## 2014-06-12 LAB — COMPREHENSIVE METABOLIC PANEL
ALBUMIN: 4.1 g/dL (ref 3.5–5.2)
ALK PHOS: 80 U/L (ref 39–117)
ALT: 15 U/L (ref 0–35)
ANION GAP: 12 (ref 5–15)
AST: 27 U/L (ref 0–37)
BILIRUBIN TOTAL: 7 mg/dL — AB (ref 0.3–1.2)
BUN: 8 mg/dL (ref 6–23)
CHLORIDE: 101 meq/L (ref 96–112)
CO2: 26 mEq/L (ref 19–32)
Calcium: 9.5 mg/dL (ref 8.4–10.5)
Creatinine, Ser: 0.78 mg/dL (ref 0.50–1.10)
GFR calc Af Amer: >90 mL/min (ref >90)
GFR calc non Af Amer: >90 mL/min (ref >90)
Glucose, Bld: 86 mg/dL (ref 70–99)
POTASSIUM: 4.3 meq/L (ref 3.7–5.3)
Sodium: 139 mEq/L (ref 137–147)
Total Protein: 7.5 g/dL (ref 6.0–8.3)

## 2014-06-12 LAB — CBC WITH DIFFERENTIAL/PLATELET
BASOS ABS: 0 10*3/uL (ref 0.0–0.1)
Band Neutrophils: 0 % (ref 0–10)
Basophils Relative: 0 % (ref 0–1)
Blasts: 0 %
Eosinophils Absolute: 0.1 10*3/uL (ref 0.0–0.7)
Eosinophils Relative: 1 % (ref 0–5)
HEMATOCRIT: 25.1 % — AB (ref 36.0–46.0)
Hemoglobin: 9.1 g/dL — ABNORMAL LOW (ref 12.0–15.0)
Lymphocytes Relative: 24 % (ref 12–46)
Lymphs Abs: 3 10*3/uL (ref 0.7–4.0)
MCH: 36 pg — AB (ref 26.0–34.0)
MCHC: 36.3 g/dL — ABNORMAL HIGH (ref 30.0–36.0)
MCV: 99.2 fL (ref 78.0–100.0)
METAMYELOCYTES PCT: 0 %
MYELOCYTES: 0 %
Monocytes Absolute: 1 10*3/uL (ref 0.1–1.0)
Monocytes Relative: 8 % (ref 3–12)
Neutro Abs: 8.6 10*3/uL — ABNORMAL HIGH (ref 1.7–7.7)
Neutrophils Relative %: 67 % (ref 43–77)
PLATELETS: 604 10*3/uL — AB (ref 150–400)
Promyelocytes Absolute: 0 %
RBC: 2.53 MIL/uL — AB (ref 3.87–5.11)
RDW: 17.5 % — ABNORMAL HIGH (ref 11.5–15.5)
WBC: 12.7 10*3/uL — AB (ref 4.0–10.5)
nRBC: 3 /100 WBC — ABNORMAL HIGH

## 2014-06-12 LAB — URINALYSIS, ROUTINE W REFLEX MICROSCOPIC
Bilirubin Urine: NEGATIVE
GLUCOSE, UA: NEGATIVE mg/dL
HGB URINE DIPSTICK: NEGATIVE
Ketones, ur: NEGATIVE mg/dL
Nitrite: NEGATIVE
PH: 6 (ref 5.0–8.0)
Protein, ur: NEGATIVE mg/dL
SPECIFIC GRAVITY, URINE: 1.01 (ref 1.005–1.030)
UROBILINOGEN UA: 4 mg/dL — AB (ref 0.0–1.0)

## 2014-06-12 LAB — PREGNANCY, URINE: PREG TEST UR: NEGATIVE

## 2014-06-12 LAB — RETICULOCYTES
RBC.: 2.53 MIL/uL — AB (ref 3.87–5.11)
Retic Count, Absolute: 440.2 10*3/uL — ABNORMAL HIGH (ref 19.0–186.0)
Retic Ct Pct: 17.4 % — ABNORMAL HIGH (ref 0.4–3.1)

## 2014-06-12 LAB — URINE MICROSCOPIC-ADD ON

## 2014-06-12 LAB — LIPASE, BLOOD: Lipase: 19 U/L (ref 11–59)

## 2014-06-12 MED ORDER — HYDROMORPHONE HCL PF 1 MG/ML IJ SOLN
1.0000 mg | Freq: Once | INTRAMUSCULAR | Status: AC
  Administered 2014-06-12: 1 mg via INTRAVENOUS
  Filled 2014-06-12: qty 1

## 2014-06-12 MED ORDER — KETOROLAC TROMETHAMINE 30 MG/ML IJ SOLN
30.0000 mg | Freq: Once | INTRAMUSCULAR | Status: AC
  Administered 2014-06-12: 30 mg via INTRAVENOUS
  Filled 2014-06-12: qty 1

## 2014-06-12 MED ORDER — DEXTROSE 5 % IV SOLN
1.0000 g | Freq: Once | INTRAVENOUS | Status: AC
  Administered 2014-06-12: 1 g via INTRAVENOUS
  Filled 2014-06-12: qty 10

## 2014-06-12 MED ORDER — ONDANSETRON HCL 4 MG/2ML IJ SOLN
4.0000 mg | Freq: Once | INTRAMUSCULAR | Status: AC
  Administered 2014-06-12: 4 mg via INTRAVENOUS
  Filled 2014-06-12: qty 2

## 2014-06-12 MED ORDER — SODIUM CHLORIDE 0.9 % IV BOLUS (SEPSIS)
1000.0000 mL | Freq: Once | INTRAVENOUS | Status: AC
  Administered 2014-06-12: 1000 mL via INTRAVENOUS

## 2014-06-12 MED ORDER — OXYCODONE-ACETAMINOPHEN 5-325 MG PO TABS: 2.0000 | ORAL_TABLET | ORAL | Status: DC | PRN

## 2014-06-12 MED ORDER — AZITHROMYCIN 250 MG PO TABS
1000.0000 mg | ORAL_TABLET | Freq: Once | ORAL | Status: AC
  Administered 2014-06-12: 1000 mg via ORAL
  Filled 2014-06-12: qty 4

## 2014-06-12 MED ORDER — METRONIDAZOLE 500 MG PO TABS
2000.0000 mg | ORAL_TABLET | Freq: Once | ORAL | Status: AC
  Administered 2014-06-12: 2000 mg via ORAL
  Filled 2014-06-12: qty 4

## 2014-06-12 NOTE — ED Provider Notes (Signed)
CSN: [id number]     Arrival date & time 06/12/14  47 History   First MD Initiated Contact with Patient 06/12/14 1600     Chief Complaint  Patient presents with  . Sickle Cell Pain Crisis     (Consider location/radiation/quality/duration/timing/severity/associated sxs/prior Treatment) HPI Comments: Patient presents to the ER for evaluation of sickle cell pain. Patient reports she's been having pain in both of her arms for 3 days. This is typical for her sickle cell pain. She reports severe, aching pain. She does not have any pain medication to use at home. Symptoms have progressively worsened.  Also complaining of pain in the right upper condyle area. This has been ongoing for the same number of days. She also reports progressive worsening. No fever, nausea, vomiting, diarrhea or constipation.  Patient also reports that her sexual partner was recently diagnosed with an STD, not sure if gonorrhea or chlamydia.  Patient is a 18 y.o. female presenting with sickle cell pain.  Sickle Cell Pain Crisis   Past Medical History  Diagnosis Date  . Asthma   . Sickle cell disease   . Amenorrhea    Past Surgical History  Procedure Laterality Date  . Splenectomy      Age 49 for sequestration  . Tonsillectomy      Age 83   Family History  Problem Relation Age of Onset  . Hypertension Paternal Grandfather   . Sickle cell trait Father   . Cancer Mother     Died in 02/01/09   History  Substance Use Topics  . Smoking status: Current Some Day Smoker -- 1.00 packs/day    Types: Cigarettes  . Smokeless tobacco: Never Used  . Alcohol Use: No     Comment: Denies 06/12/2014   OB History   Grav Para Term Preterm Abortions TAB SAB Ect Mult Living                 Review of Systems  Gastrointestinal: Positive for abdominal pain.  Musculoskeletal: Positive for arthralgias.  All other systems reviewed and are negative.     Allergies  Review of patient's allergies indicates no known  allergies.  Home Medications   Prior to Admission medications   Medication Sig Start Date End Date Taking? Authorizing Provider  albuterol (PROVENTIL HFA;VENTOLIN HFA) 108 (90 BASE) MCG/ACT inhaler Inhale 2 puffs into the lungs every 4 (four) hours as needed for wheezing or shortness of breath. For shortness of breath 08/01/13   Marin Olp, MD   BP 112/67  Pulse 92  Temp(Src) 99 F (37.2 C) (Oral)  Resp 20  Ht 5\' 4"  (1.626 m)  Wt 139 lb (63.05 kg)  BMI 23.85 kg/m2  SpO2 98%  LMP 05/13/2014 Physical Exam  Constitutional: She is oriented to person, place, and time. She appears well-developed and well-nourished. No distress.  HENT:  Head: Normocephalic and atraumatic.  Right Ear: Hearing normal.  Left Ear: Hearing normal.  Nose: Nose normal.  Mouth/Throat: Oropharynx is clear and moist and mucous membranes are normal.  Eyes: Conjunctivae and EOM are normal. Pupils are equal, round, and reactive to light.  Neck: Normal range of motion. Neck supple.  Cardiovascular: Regular rhythm, S1 normal and S2 normal.  Exam reveals no gallop and no friction rub.   No murmur heard. Pulmonary/Chest: Effort normal and breath sounds normal. No respiratory distress. She exhibits no tenderness.  Abdominal: Soft. Normal appearance and bowel sounds are normal. There is no hepatosplenomegaly. There is tenderness in the right  upper quadrant. There is no rebound, no guarding, no tenderness at McBurney's point and negative Murphy's sign. No hernia.  Musculoskeletal: Normal range of motion.  Neurological: She is alert and oriented to person, place, and time. She has normal strength. No cranial nerve deficit or sensory deficit. Coordination normal. GCS eye subscore is 4. GCS verbal subscore is 5. GCS motor subscore is 6.  Skin: Skin is warm, dry and intact. No rash noted. No cyanosis.  Psychiatric: She has a normal mood and affect. Her speech is normal and behavior is normal. Thought content normal.     ED Course  Procedures (including critical care time) Labs Review Labs Reviewed  CBC WITH DIFFERENTIAL - Abnormal; Notable for the following:    WBC 12.7 (*)    RBC 2.53 (*)    Hemoglobin 9.1 (*)    HCT 25.1 (*)    MCH 36.0 (*)    MCHC 36.3 (*)    RDW 17.5 (*)    Platelets 604 (*)    nRBC 3 (*)    Neutro Abs 8.6 (*)    All other components within normal limits  COMPREHENSIVE METABOLIC PANEL - Abnormal; Notable for the following:    Total Bilirubin 7.0 (*)    All other components within normal limits  URINALYSIS, ROUTINE W REFLEX MICROSCOPIC - Abnormal; Notable for the following:    Urobilinogen, UA 4.0 (*)    Leukocytes, UA TRACE (*)    All other components within normal limits  RETICULOCYTES - Abnormal; Notable for the following:    Retic Ct Pct 17.4 (*)    RBC. 2.53 (*)    Retic Count, Manual 440.2 (*)    All other components within normal limits  URINE MICROSCOPIC-ADD ON - Abnormal; Notable for the following:    Squamous Epithelial / LPF FEW (*)    All other components within normal limits  LIPASE, BLOOD  PREGNANCY, URINE    Imaging Review US Abdomen Complete  06/12/2014   CLINICAL DATA:  Right upper quadrant pain  EXAM: ULTRASOUND ABDOMEN COMPLETE  COMPARISON:  CT 02/28/2014  FINDINGS: Gallbladder:  There are multiple small round gallstones within the lumen of the gallbladder. Number of gallstones is more than identified on CT. The stones measure approximately 6 to 8 mm number approximately 12. No pericholecystic fluid or gallbladder wall thickening. Negative sonographic Murphy's sign.  Common bile duct:  Diameter: Normal at 3 mm.  Liver:  No focal lesion identified. Within normal limits in parenchymal echogenicity.  IVC:  No abnormality visualized.  Pancreas:  Visualized portion unremarkable.  Spleen:  Surgically absent  Right Kidney:  Length: 12.5 cm. Echogenicity within normal limits. No mass or hydronephrosis visualized.  Left Kidney:  Length: 12.4 cm. Echogenicity  within normal limits. No mass or hydronephrosis visualized.  Abdominal aorta:  No aneurysm visualized.  Other findings:  No ascites  IMPRESSION: 1. Multiple gallstones within the lumen of the gallbladder. No evidence acute cholecystitis. 2. Splenectomy.   Electronically Signed   By: Suzy Bouchard M.D.   On: 06/12/2014 17:18     EKG Interpretation None      MDM   Final diagnoses:  None   sickle cell crisis  Gallstones  STD contact  Patient presents to ER for evaluation of multiple complaints. Patient reports that she is experiencing a sickle cell crisis the last 3 days with pain, cramping in her arms. This is typical for her sickle cell disease. She does not have any chest pain or shortness of  breath. No concern for acute chest. Patient has appropriate to close white count, is not experiencing any significant anemia. She was treated with Dilantin and IV fluids. She does have elevated bilirubin which is chronic for her. This is secondary to her sickle cell anemia. LFTs were not elevated. She does, however, have no quadrant pain. There is no significant tenderness, negative Murphy sign. Ultrasound does show gallstones without any pericholecystic fluid, gallbladder wall thickening, negative Murphy sign. She does not have any evidence of acute cholecystitis. She'll need to follow up with general surgery as an outpatient.  Patient also reports that her sexual partner was told that he had contact with an unknown STD. This is not experiencing any symptoms currently. She will simply be empirically treated.    Orpah Greek, MD 06/12/14 [phone]

## 2014-06-12 NOTE — ED Notes (Signed)
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 Pt also states her "friend" told her that he had and STD with patient stating, "probably gonorrhea or chlamydia and I need to get checked out"

## 2014-06-12 NOTE — Discharge Instructions (Signed)
Sexually Transmitted Disease A sexually transmitted disease (STD) is a disease or infection that may be passed (transmitted) from person to person, usually during sexual activity. This may happen by way of saliva, semen, blood, vaginal mucus, or urine. Common STDs include:   Gonorrhea.   Chlamydia.   Syphilis.   HIV and AIDS.   Genital herpes.   Hepatitis B and C.   Trichomonas.   Human papillomavirus (HPV).   Pubic lice.   Scabies.  Mites.  Bacterial vaginosis. WHAT ARE CAUSES OF STDs? An STD may be caused by bacteria, a virus, or parasites. STDs are often transmitted during sexual activity if one person is infected. However, they may also be transmitted through nonsexual means. STDs may be transmitted after:   Sexual intercourse with an infected person.   Sharing sex toys with an infected person.   Sharing needles with an infected person or using unclean piercing or tattoo needles.  Having intimate contact with the genitals, mouth, or rectal areas of an infected person.   Exposure to infected fluids during birth. WHAT ARE THE SIGNS AND SYMPTOMS OF STDs? Different STDs have different symptoms. Some people may not have any symptoms. If symptoms are present, they may include:   Painful or bloody urination.   Pain in the pelvis, abdomen, vagina, anus, throat, or eyes.   A skin rash, itching, or irritation.  Growths, ulcerations, blisters, or sores in the genital and anal areas.  Abnormal vaginal discharge with or without bad odor.   Penile discharge in men.   Fever.   Pain or bleeding during sexual intercourse.   Swollen glands in the groin area.   Yellow skin and eyes (jaundice). This is seen with hepatitis.   Swollen testicles.  Infertility.  Sores and blisters in the mouth. HOW ARE STDs DIAGNOSED? To make a diagnosis, your health care provider may:   Take a medical history.   Perform a physical exam.   Take a sample of  any discharge to examine.  Swab the throat, cervix, opening to the penis, rectum, or vagina for testing.  Test a sample of your first morning urine.   Perform blood tests.   Perform a Pap test, if this applies.   Perform a colposcopy.   Perform a laparoscopy.  HOW ARE STDs TREATED? Treatment depends on the STD. Some STDs may be treated but not cured.   Chlamydia, gonorrhea, trichomonas, and syphilis can be cured with antibiotic medicine.   Genital herpes, hepatitis, and HIV can be treated, but not cured, with prescribed medicines. The medicines lessen symptoms.   Genital warts from HPV can be treated with medicine or by freezing, burning (electrocautery), or surgery. Warts may come back.   HPV cannot be cured with medicine or surgery. However, abnormal areas may be removed from the cervix, vagina, or vulva.   If your diagnosis is confirmed, your recent sexual partners need treatment. This is true even if they are symptom-free or have a negative culture or evaluation. They should not have sex until their health care providers say it is okay. HOW CAN I REDUCE MY RISK OF GETTING AN STD? Take these steps to reduce your risk of getting an STD:  Use latex condoms, dental dams, and water-soluble lubricants during sexual activity. Do not use petroleum jelly or oils.  Avoid having multiple sex partners.  Do not have sex with someone who has other sex partners.  Do not have sex with anyone you do not know or who is at   high risk for an STD.  Avoid risky sex practices that can break your skin.  Do not have sex if you have open sores on your mouth or skin.  Avoid drinking too much alcohol or taking illegal drugs. Alcohol and drugs can affect your judgment and put you in a vulnerable position.  Avoid engaging in oral and anal sex acts.  Get vaccinated for HPV and hepatitis. If you have not received these vaccines in the past, talk to your health care provider about whether one  or both might be right for you.   If you are at risk of being infected with HIV, it is recommended that you take a prescription medicine daily to prevent HIV infection. This is called pre-exposure prophylaxis (PrEP). You are considered at risk if:  You are a man who has sex with other men (MSM).  You are a heterosexual man or woman and are sexually active with more than one partner.  You take drugs by injection.  You are sexually active with a partner who has HIV.  Talk with your health care provider about whether you are at high risk of being infected with HIV. If you choose to begin PrEP, you should first be tested for HIV. You should then be tested every 3 months for as long as you are taking PrEP.  WHAT SHOULD I DO IF I THINK I HAVE AN STD?  See your health care provider.   Tell your sexual partner(s). They should be tested and treated for any STDs.  Do not have sex until your health care provider says it is okay. WHEN SHOULD I GET IMMEDIATE MEDICAL CARE? Contact your health care provider right away if:   You have severe abdominal pain.  You are a man and notice swelling or pain in your testicles.  You are a woman and notice swelling or pain in your vagina. Document Released: 01/06/2003 Document Revised: 10/21/2013 Document Reviewed: 05/06/2013 [facility] Patient Information 2015 [place], [place]. This information is not intended to replace advice given to you by your health care provider. Make sure you discuss any questions you have with your health care provider.  Low-Fat Diet for Pancreatitis or Gallbladder Conditions A low-fat diet can be helpful if you have pancreatitis or a gallbladder condition. With these conditions, your pancreas and gallbladder have trouble digesting fats. A healthy eating plan with less fat will help rest your pancreas and gallbladder and reduce your symptoms. WHAT DO I NEED TO KNOW ABOUT THIS DIET?  Eat a low-fat diet.  Reduce your fat intake to  less than 20-30% of your total daily calories. This is less than 50-60 g of fat per day.  Remember that you need some fat in your diet. Ask your dietician what your daily goal should be.  Choose nonfat and low-fat healthy foods. Look for the words "nonfat," "low fat," or "fat free."  As a guide, look on the label and choose foods with less than 3 g of fat per serving. Eat only one serving.  Avoid alcohol.  Do not smoke. If you need help quitting, talk with your health care provider.  Eat small frequent meals instead of three large heavy meals. WHAT FOODS CAN I EAT? Grains Include healthy grains and starches such as potatoes, wheat bread, fiber-rich cereal, and brown rice. Choose whole grain options whenever possible. In adults, whole grains should account for 45-65% of your daily calories.  Fruits and Vegetables Eat plenty of fruits and vegetables. Fresh fruits and vegetables  add fiber to your diet. Meats and Other Protein Sources Eat lean meat such as chicken and pork. Trim any fat off of meat before cooking it. Eggs, fish, and beans are other sources of protein. In adults, these foods should account for 10-35% of your daily calories. Dairy Choose low-fat milk and dairy options. Dairy includes fat and protein, as well as calcium.  Fats and Oils Limit high-fat foods such as fried foods, sweets, baked goods, sugary drinks.  Other Creamy sauces and condiments, such as mayonnaise, can add extra fat. Think about whether or not you need to use them, or use smaller amounts or low fat options. WHAT FOODS ARE NOT RECOMMENDED?  High fat foods, such as:  [employer].  Ice cream.  [place] toast.  Sweet rolls.  Pizza.  Cheese bread.  Foods covered with batter, butter, creamy sauces, or cheese.  Fried foods.  Sugary drinks and desserts.  Foods that cause gas or bloating Document Released: 10/21/2013 Document Reviewed: 10/21/2013 [facility] Patient Information 2015 [place], [place].  This information is not intended to replace advice given to you by your health care provider. Make sure you discuss any questions you have with your health care provider.  Sickle Cell Anemia, Adult Sickle cell anemia is a condition in which red blood cells have an abnormal "sickle" shape. This abnormal shape shortens the cells' life span, which results in a lower than normal concentration of red blood cells in the blood. The sickle shape also causes the cells to clump together and block free blood flow through the blood vessels. As a result, the tissues and organs of the body do not receive enough oxygen. Sickle cell anemia causes organ damage and pain and increases the risk of infection. CAUSES  Sickle cell anemia is a genetic disorder. Those who receive two copies of the gene have the condition, and those who receive one copy have the trait. RISK FACTORS The sickle cell gene is most common in people whose families originated in [place]. Other areas of the globe where sickle cell trait occurs include the [place], [place] and [place], and the [place].  SIGNS AND SYMPTOMS  Pain, especially in the extremities, back, chest, or abdomen (common). The pain may start suddenly or may develop following an illness, especially if there is dehydration. Pain can also occur due to overexertion or exposure to extreme temperature changes.  Frequent severe bacterial infections, especially certain types of pneumonia and meningitis.  Pain and swelling in the hands and feet.  Decreased activity.   Loss of appetite.   Change in behavior.  Headaches.  Seizures.  Shortness of breath or difficulty breathing.  Vision changes.  Skin ulcers. Those with the trait may not have symptoms or they may have mild symptoms.  DIAGNOSIS  Sickle cell anemia is diagnosed with blood tests that demonstrate the genetic trait. It is often diagnosed during the newborn period, due to mandatory  testing nationwide. A variety of blood tests, X-rays, CT scans, MRI scans, ultrasounds, and lung function tests may also be done to monitor the condition. TREATMENT  Sickle cell anemia may be treated with:  Medicines. You may be given pain medicines, antibiotic medicines (to treat and prevent infections) or medicines to increase the production of certain types of hemoglobin.  Fluids.  Oxygen.  Blood transfusions. HOME CARE INSTRUCTIONS   Drink enough fluid to keep your urine clear or pale yellow. Increase your fluid intake in hot weather and during exercise.  Do not smoke.  Smoking lowers oxygen levels in the blood.   Only take over-the-counter or prescription medicines for pain, fever, or discomfort as directed by your health care provider.  Take antibiotics as directed by your health care provider. Make sure you finish them it even if you start to feel better.   Take supplements as directed by your health care provider.   Consider wearing a medical alert bracelet. This tells anyone caring for you in an emergency of your condition.   When traveling, keep your medical information, health care provider's names, and the medicines you take with you at all times.   If you develop a fever, do not take medicines to reduce the fever right away. This could cover up a problem that is developing. Notify your health care provider.  Keep all follow-up appointments with your health care provider. Sickle cell anemia requires regular medical care. SEEK MEDICAL CARE IF: You have a fever. SEEK IMMEDIATE MEDICAL CARE IF:   You feel dizzy or faint.   You have new abdominal pain, especially on the left side near the stomach area.   You develop a persistent, often uncomfortable and painful penile erection (priapism). If this is not treated immediately it will lead to impotence.   You have numbness your arms or legs or you have a hard time moving them.   You have a hard time with speech.    You have a fever or persistent symptoms for more than 2-3 days.   You have a fever and your symptoms suddenly get worse.   You have signs or symptoms of infection. These include:   Chills.   Abnormal tiredness (lethargy).   Irritability.   Poor eating.   Vomiting.   You develop pain that is not helped with medicine.   You develop shortness of breath.  You have pain in your chest.   You are coughing up pus-like or bloody sputum.   You develop a stiff neck.  Your feet or hands swell or have pain.  Your abdomen appears bloated.  You develop joint pain. MAKE SURE YOU:  Understand these instructions. Document Released: 01/24/2006 Document Revised: 03/02/2014 Document Reviewed: 05/28/2013 [facility] Patient Information 2015 [place], [place]. This information is not intended to replace advice given to you by your health care provider. Make sure you discuss any questions you have with your health care provider. Cholelithiasis Cholelithiasis (also called gallstones) is a form of gallbladder disease in which gallstones form in your gallbladder. The gallbladder is an organ that stores bile made in the liver, which helps digest fats. Gallstones begin as small crystals and slowly grow into stones. Gallstone pain occurs when the gallbladder spasms and a gallstone is blocking the duct. Pain can also occur when a stone passes out of the duct.  RISK FACTORS  Being female.   Having multiple pregnancies. Health care providers sometimes advise removing diseased gallbladders before future pregnancies.   Being obese.  Eating a diet heavy in fried foods and fat.   Being older than 14 years and increasing age.   Prolonged use of medicines containing female hormones.   Having diabetes mellitus.   Rapidly losing weight.   Having a family history of gallstones (heredity).  SYMPTOMS  Nausea.   Vomiting.  Abdominal pain.   Yellowing of the skin (jaundice).    Sudden pain. It may persist from several minutes to several hours.  Fever.   Tenderness to the touch. In some cases, when gallstones do not move into the bile duct,  people have no pain or symptoms. These are called "silent" gallstones.  TREATMENT Silent gallstones do not need treatment. In severe cases, emergency surgery may be required. Options for treatment include:  Surgery to remove the gallbladder. This is the most common treatment.  Medicines. These do not always work and may take 6-12 months or more to work.  Shock wave treatment (extracorporeal biliary lithotripsy). In this treatment an ultrasound machine sends shock waves to the gallbladder to break gallstones into smaller pieces that can pass into the intestines or be dissolved by medicine. HOME CARE INSTRUCTIONS   Only take over-the-counter or prescription medicines for pain, discomfort, or fever as directed by your health care provider.   Follow a low-fat diet until seen again by your health care provider. Fat causes the gallbladder to contract, which can result in pain.   Follow up with your health care provider as directed. Attacks are almost always recurrent and surgery is usually required for permanent treatment.  SEEK IMMEDIATE MEDICAL CARE IF:   Your pain increases and is not controlled by medicines.   You have a fever or persistent symptoms for more than 2-3 days.   You have a fever and your symptoms suddenly get worse.   You have persistent nausea and vomiting.  MAKE SURE YOU:   Understand these instructions.  Will watch your condition.  Will get help right away if you are not doing well or get worse. Document Released: 10/12/2005 Document Revised: 06/18/2013 Document Reviewed: 04/09/2013 [facility] Patient Information 2015 [place], [place]. This information is not intended to replace advice given to you by your health care provider. Make sure you discuss any questions you have with your health  care provider.

## 2014-06-17 ENCOUNTER — Emergency Department (HOSPITAL_COMMUNITY)
Admission: EM | Admit: 2014-06-17 | Discharge: 2014-06-17 | Disposition: A | Discharge status: Home / Self Care | Payer: Medicaid Other | Attending: Emergency Medicine | Admitting: Emergency Medicine

## 2014-06-17 ENCOUNTER — Encounter (HOSPITAL_COMMUNITY): Payer: Self-pay | Admitting: Emergency Medicine

## 2014-06-17 ENCOUNTER — Emergency Department (HOSPITAL_COMMUNITY): Payer: Medicaid Other

## 2014-06-17 DIAGNOSIS — D5700 Hb-SS disease with crisis, unspecified: Secondary | ICD-10-CM | POA: Insufficient documentation

## 2014-06-17 DIAGNOSIS — R1011 Right upper quadrant pain: Secondary | ICD-10-CM | POA: Diagnosis not present

## 2014-06-17 DIAGNOSIS — Z3202 Encounter for pregnancy test, result negative: Secondary | ICD-10-CM | POA: Insufficient documentation

## 2014-06-17 DIAGNOSIS — Z79899 Other long term (current) drug therapy: Secondary | ICD-10-CM | POA: Insufficient documentation

## 2014-06-17 DIAGNOSIS — IMO0001 Reserved for inherently not codable concepts without codable children: Secondary | ICD-10-CM | POA: Insufficient documentation

## 2014-06-17 DIAGNOSIS — J45909 Unspecified asthma, uncomplicated: Secondary | ICD-10-CM | POA: Insufficient documentation

## 2014-06-17 DIAGNOSIS — K8020 Calculus of gallbladder without cholecystitis without obstruction: Secondary | ICD-10-CM | POA: Diagnosis not present

## 2014-06-17 DIAGNOSIS — Z8742 Personal history of other diseases of the female genital tract: Secondary | ICD-10-CM | POA: Diagnosis not present

## 2014-06-17 DIAGNOSIS — F17200 Nicotine dependence, unspecified, uncomplicated: Secondary | ICD-10-CM | POA: Insufficient documentation

## 2014-06-17 DIAGNOSIS — Z9889 Other specified postprocedural states: Secondary | ICD-10-CM | POA: Insufficient documentation

## 2014-06-17 DIAGNOSIS — M79609 Pain in unspecified limb: Secondary | ICD-10-CM | POA: Insufficient documentation

## 2014-06-17 LAB — CBC WITH DIFFERENTIAL/PLATELET
Basophils Absolute: 0.1 10*3/uL (ref 0.0–0.1)
Basophils Relative: 1 % (ref 0–1)
EOS ABS: 0.1 10*3/uL (ref 0.0–0.7)
EOS PCT: 1 % (ref 0–5)
HCT: 25.3 % — ABNORMAL LOW (ref 36.0–46.0)
Hemoglobin: 9 g/dL — ABNORMAL LOW (ref 12.0–15.0)
LYMPHS ABS: 6.3 10*3/uL — AB (ref 0.7–4.0)
Lymphocytes Relative: 47 % — ABNORMAL HIGH (ref 12–46)
MCH: 35.4 pg — AB (ref 26.0–34.0)
MCHC: 35.6 g/dL (ref 30.0–36.0)
MCV: 99.6 fL (ref 78.0–100.0)
Monocytes Absolute: 1.5 10*3/uL — ABNORMAL HIGH (ref 0.1–1.0)
Monocytes Relative: 11 % (ref 3–12)
Neutro Abs: 5.4 10*3/uL (ref 1.7–7.7)
Neutrophils Relative %: 40 % — ABNORMAL LOW (ref 43–77)
PLATELETS: 551 10*3/uL — AB (ref 150–400)
RBC: 2.54 MIL/uL — AB (ref 3.87–5.11)
RDW: 17.1 % — ABNORMAL HIGH (ref 11.5–15.5)
WBC: 13.4 10*3/uL — ABNORMAL HIGH (ref 4.0–10.5)

## 2014-06-17 LAB — COMPREHENSIVE METABOLIC PANEL
ALT: 25 U/L (ref 0–35)
AST: 36 U/L (ref 0–37)
Albumin: 4.1 g/dL (ref 3.5–5.2)
Alkaline Phosphatase: 79 U/L (ref 39–117)
Anion gap: 14 (ref 5–15)
BILIRUBIN TOTAL: 3.2 mg/dL — AB (ref 0.3–1.2)
BUN: 11 mg/dL (ref 6–23)
CHLORIDE: 104 meq/L (ref 96–112)
CO2: 22 mEq/L (ref 19–32)
Calcium: 10 mg/dL (ref 8.4–10.5)
Creatinine, Ser: 0.72 mg/dL (ref 0.50–1.10)
Glucose, Bld: 102 mg/dL — ABNORMAL HIGH (ref 70–99)
Potassium: 4.7 mEq/L (ref 3.7–5.3)
Sodium: 140 mEq/L (ref 137–147)
Total Protein: 7.7 g/dL (ref 6.0–8.3)

## 2014-06-17 LAB — URINALYSIS, ROUTINE W REFLEX MICROSCOPIC
Bilirubin Urine: NEGATIVE
Glucose, UA: NEGATIVE mg/dL
Hgb urine dipstick: NEGATIVE
KETONES UR: NEGATIVE mg/dL
LEUKOCYTES UA: NEGATIVE
Nitrite: NEGATIVE
PROTEIN: NEGATIVE mg/dL
Specific Gravity, Urine: 1.014 (ref 1.005–1.030)
UROBILINOGEN UA: 0.2 mg/dL (ref 0.0–1.0)
pH: 6 (ref 5.0–8.0)

## 2014-06-17 LAB — RETICULOCYTES
RBC.: 2.54 MIL/uL — ABNORMAL LOW (ref 3.87–5.11)
RETIC CT PCT: 19.3 % — AB (ref 0.4–3.1)
Retic Count, Absolute: 490.2 10*3/uL — ABNORMAL HIGH (ref 19.0–186.0)

## 2014-06-17 LAB — POC URINE PREG, ED: Preg Test, Ur: NEGATIVE

## 2014-06-17 LAB — PATHOLOGIST SMEAR REVIEW

## 2014-06-17 LAB — LIPASE, BLOOD: Lipase: 29 U/L (ref 11–59)

## 2014-06-17 MED ORDER — OXYCODONE-ACETAMINOPHEN 5-325 MG PO TABS: 2.0000 | ORAL_TABLET | ORAL | Status: DC | PRN

## 2014-06-17 MED ORDER — ONDANSETRON HCL 4 MG PO TABS: 4.0000 mg | ORAL_TABLET | Freq: Four times a day (QID) | ORAL | Status: DC

## 2014-06-17 MED ORDER — HYDROMORPHONE HCL PF 1 MG/ML IJ SOLN
1.0000 mg | Freq: Once | INTRAMUSCULAR | Status: AC
  Administered 2014-06-17: 1 mg via INTRAVENOUS
  Filled 2014-06-17: qty 1

## 2014-06-17 MED ORDER — HYDROMORPHONE HCL PF 1 MG/ML IJ SOLN
1.0000 mg | Freq: Once | INTRAMUSCULAR | Status: AC
  Administered 2014-06-17: 1 mg via INTRAMUSCULAR
  Filled 2014-06-17: qty 1

## 2014-06-17 MED ORDER — IOHEXOL 300 MG/ML  SOLN
25.0000 mL | Freq: Once | INTRAMUSCULAR | Status: AC | PRN
  Administered 2014-06-17: 25 mL via ORAL

## 2014-06-17 MED ORDER — FENTANYL CITRATE 0.05 MG/ML IJ SOLN
50.0000 ug | Freq: Once | INTRAMUSCULAR | Status: AC
  Administered 2014-06-17: 50 ug via NASAL

## 2014-06-17 MED ORDER — IOHEXOL 300 MG/ML  SOLN
80.0000 mL | Freq: Once | INTRAMUSCULAR | Status: AC | PRN
  Administered 2014-06-17: 80 mL via INTRAVENOUS

## 2014-06-17 MED ORDER — KETOROLAC TROMETHAMINE 30 MG/ML IJ SOLN: 30.0000 mg | Freq: Once | INTRAMUSCULAR | Status: DC

## 2014-06-17 MED ORDER — FENTANYL CITRATE 0.05 MG/ML IJ SOLN
INTRAMUSCULAR | Status: AC
  Filled 2014-06-17: qty 2

## 2014-06-17 MED ORDER — SODIUM CHLORIDE 0.9 % IV BOLUS (SEPSIS)
1000.0000 mL | Freq: Once | INTRAVENOUS | Status: AC
  Administered 2014-06-17: 1000 mL via INTRAVENOUS

## 2014-06-17 NOTE — ED Provider Notes (Signed)
CSN: [id number]     Arrival date & time 06/17/14  0106 History   First MD Initiated Contact with Patient 06/17/14 0240     Chief Complaint  Patient presents with  . Sickle Cell Pain Crisis    The patient said she has been having pain in her left arm and on the right side of her abdomen.       (Consider location/radiation/quality/duration/timing/severity/associated sxs/prior Treatment) HPI Comments: Patient reports a one-week history of bilateral upper arm pain consistent with previous sickle cell pain. She's been taking Percocet which she is now out of. She was seen in the ED 5 days ago for similar symptoms. She denies any fever, chills, chest pain, shortness of breath or cough.  Her second complaint is right upper quadrant abdominal pain. This was evaluated in ED as well and she was found to have gallstones. She reports this pain is constant and not any better. She denies any nausea or vomiting. Denies any diarrhea. Denies any dysuria, hematuria, vaginal bleeding or discharge. Denies any back pain.  The history is provided by the patient.    Past Medical History  Diagnosis Date  . Asthma   . Sickle cell disease   . Amenorrhea    Past Surgical History  Procedure Laterality Date  . 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 Tonsillectomy      Age 59   Family History  Problem Relation Age of Onset  . Hypertension Paternal Grandfather   . Sickle cell trait Father   . Cancer Mother     Died in 02/23/2009   History  Substance Use Topics  . Smoking status: Current Some Day Smoker -- 1.00 packs/day    Types: Cigarettes  . Smokeless tobacco: Never Used  . Alcohol Use: No     Comment: Denies 06/12/2014   OB History   Grav Para Term Preterm Abortions TAB SAB Ect Mult Living                 Review of Systems  Constitutional: Negative for fever, activity change and appetite change.  Respiratory: Negative for cough, chest tightness and shortness of breath.   Cardiovascular:  Negative for chest pain.  Gastrointestinal: Positive for abdominal pain. Negative for nausea and vomiting.  Genitourinary: Negative for dysuria, hematuria, vaginal bleeding and vaginal discharge.  Musculoskeletal: Positive for arthralgias and myalgias.  Skin: Negative for rash.  Neurological: Negative for dizziness, weakness and numbness.  A complete 10 system review of systems was obtained and all systems are negative except as noted in the HPI and PMH.      Allergies  Review of patient's allergies indicates no known allergies.  Home Medications   Prior to Admission medications   Medication Sig Start Date End Date Taking? Authorizing Provider  albuterol (PROVENTIL HFA;VENTOLIN HFA) 108 (90 BASE) MCG/ACT inhaler Inhale 2 puffs into the lungs every 4 (four) hours as needed for wheezing or shortness of breath. For shortness of breath 08/01/13  Yes Marin Olp, MD  oxyCODONE-acetaminophen (PERCOCET) 5-325 MG per tablet Take 2 tablets by mouth every 4 (four) hours as needed. 06/12/14  Yes Orpah Greek, MD  ondansetron (ZOFRAN) 4 MG tablet Take 1 tablet (4 mg total) by mouth every 6 (six) hours. 06/17/14   Ezequiel Essex, MD  oxyCODONE-acetaminophen (PERCOCET/ROXICET) 5-325 MG per tablet Take 2 tablets by mouth every 4 (four) hours as needed for severe pain. 06/17/14   Ezequiel Essex, MD   BP 115/62  Pulse 67  Temp(Src) 98.5 F (36.9 C) (Oral)  Resp 20  SpO2 99%  LMP 05/13/2014 Physical Exam  Nursing note and vitals reviewed. Constitutional: She is oriented to person, place, and time. She appears well-developed and well-nourished. No distress.  HENT:  Head: Normocephalic and atraumatic.  Mouth/Throat: Oropharynx is clear and moist. No oropharyngeal exudate.  Eyes: Conjunctivae and EOM are normal. Pupils are equal, round, and reactive to light.  Neck: Normal range of motion. Neck supple.  No meningismus.  Cardiovascular: Normal rate, regular rhythm, normal heart sounds  and intact distal pulses.   No murmur heard. Pulmonary/Chest: Effort normal and breath sounds normal. No respiratory distress.  Abdominal: Soft. There is tenderness. There is no rebound and no guarding.  TTP RUQ, no guarding.  Musculoskeletal: Normal range of motion. She exhibits no edema and no tenderness.  TTP upper extremities bilaterally.  No erythema or edema.  FROM major joints  Neurological: She is alert and oriented to person, place, and time. No cranial nerve deficit. She exhibits normal muscle tone. Coordination normal.  No ataxia on finger to nose bilaterally. No pronator drift. 5/5 strength throughout. CN 2-12 intact. Negative Romberg. Equal grip strength. Sensation intact. Gait is normal.   Skin: Skin is warm.  Psychiatric: She has a normal mood and affect. Her behavior is normal.    ED Course  Procedures (including critical care time) Labs Review Labs Reviewed  CBC WITH DIFFERENTIAL - Abnormal; Notable for the following:    WBC 13.4 (*)    RBC 2.54 (*)    Hemoglobin 9.0 (*)    HCT 25.3 (*)    MCH 35.4 (*)    RDW 17.1 (*)    Platelets 551 (*)    Neutrophils Relative % 40 (*)    Lymphocytes Relative 47 (*)    Lymphs Abs 6.3 (*)    Monocytes Absolute 1.5 (*)    All other components within normal limits  COMPREHENSIVE METABOLIC PANEL - Abnormal; Notable for the following:    Glucose, Bld 102 (*)    Total Bilirubin 3.2 (*)    All other components within normal limits  RETICULOCYTES - Abnormal; Notable for the following:    Retic Ct Pct 19.3 (*)    RBC. 2.54 (*)    Retic Count, Manual 490.2 (*)    All other components within normal limits  LIPASE, BLOOD  URINALYSIS, ROUTINE W REFLEX MICROSCOPIC  POC URINE PREG, ED    Imaging Review Ct Abdomen Pelvis W Contrast  06/17/2014   CLINICAL DATA:  Right upper quadrant pain.  EXAM: CT ABDOMEN AND PELVIS WITH CONTRAST  TECHNIQUE: Multidetector CT imaging of the abdomen and pelvis was performed using the standard protocol  following bolus administration of intravenous contrast.  CONTRAST:  84mL OMNIPAQUE IOHEXOL 300 MG/ML  SOLN  COMPARISON:  02/28/2014  FINDINGS: BODY WALL: Unremarkable.  LOWER CHEST: Geographic appearing densities in the right base is most consistent with atelectasis. No definitive pneumonia.  ABDOMEN/PELVIS:  Liver: Generous size at 19 cm craniocaudal span. No focal abnormality.  Biliary: Multiple calcified gallstones. No gallbladder distention or wall thickening to suggest acute cholecystitis.  Pancreas: Unremarkable.  Spleen: Splenectomy.  Adrenals: Unremarkable.  Kidneys and ureters: No hydronephrosis or stone.  Bladder: Unremarkable.  Reproductive: There is a 21 mm is cyst within the right ovary with avid peripheral enhancement suggesting corpus luteum. 3.5 cm cyst newly present within the left ovary with thin linear enhancement (likely a vessel) along the medial margin but no nodularity  or septation. Given no cyst of this size on the previous study, this is not an epithelial neoplasm and followup is not mandatory.  Bowel: No obstruction. Negative appendix.  Retroperitoneum: No mass or adenopathy.  Peritoneum: No ascites or pneumoperitoneum.  Vascular: No acute abnormality.  OSSEOUS: No acute abnormalities.  IMPRESSION: 1. No acute intra-abdominal finding. 2. Cholelithiasis without acute cholecystitis. 3. Right ovarian corpus luteum.  Left ovarian 3.5 cm cyst.   Electronically Signed   By: Jorje Guild M.D.   On: 06/17/2014 04:16   US Abdomen Limited Ruq  06/17/2014   CLINICAL DATA:  Right upper quadrant pain.  EXAM: US ABDOMEN LIMITED - RIGHT UPPER QUADRANT  COMPARISON:  06/12/2014  FINDINGS: Gallbladder:  Numerous layering gallstones. No wall thickening or sonographic Murphy sign.  Common bile duct:  Diameter: 2.5 mm.  Where visualized, no filling defect.  Liver:  No focal lesion identified. Within normal limits in parenchymal echogenicity. Antegrade flow in the imaged portal venous system.  IMPRESSION:  Cholelithiasis without acute cholecystitis.   Electronically Signed   By: Jorje Guild M.D.   On: 06/17/2014 04:54     EKG Interpretation None      MDM   Final diagnoses:  Sickle cell crisis  Calculus of gallbladder without cholecystitis without obstruction   Typical sickle cell pain. No fever, chills, chest pain or shortness of breath. Second complaint is right upper quadrant pain with known history of gallstones.  Hemoglobin and white count appear to be stable. Elevated bilirubin though improved from previous. This is likely from her sickle cell disease rather than gallstones. LFTs normal.  Ultrasound shows cholelithiasis without cholecystitis. Negative appendix on imaging.  Patient offered admission for pain control. She declined stating she needs to go back to her hotel. Her abdomen is soft and nontender. Patient is to followup with general surgery regarding her gallstones.  Patient upset that she cannot receive pain pills to take home from the ED.  Informed patient that pills cannot be dispensed for later use.  She wishes to go home and again declines admission for pain control. She will be referred to surgery for her gallstones. No evidence of cholecystitis.  BP 115/62  Pulse 67  Temp(Src) 98.5 F (36.9 C) (Oral)  Resp 20  SpO2 99%  LMP 05/13/2014   Ezequiel Essex, MD 06/17/14 1005

## 2014-06-17 NOTE — ED Notes (Signed)
The patient said she has been having pain in her left arm and on the right side of her abdomen.   The patient said she went to [facility] for the same symptoms and they gave her pain medication.  The patient was discharged the same day and was given a prescription for pain medications.  She took the last of the pan medication and she is still in severe pain.  She is here to be evaluated.

## 2014-06-17 NOTE — ED Notes (Signed)
Pt called, spoke with [place], states will add lipase.

## 2014-06-17 NOTE — ED Notes (Signed)
Informed patient to wait 30 minutes after administering the IM dilaudid. Informed patients of risks of leaving before 30 minutes and asked patient to wait 30 minutes. Pt and her aunt stated they needed to leave and they understand risks of leaving immediately after IM Dilaudid administration. Pt A&OX4, ambulatory at d/c with steady gait, NAD.

## 2014-06-17 NOTE — Discharge Instructions (Signed)
Biliary Colic  Follow up with the surgeons to evaluate your gallbladder. Follow up with your primary doctor. Return to the ED if you develop new or worsening symptoms. Biliary colic is a steady or irregular pain in the upper abdomen. It is usually under the right side of the rib cage. It happens when gallstones interfere with the normal flow of bile from the gallbladder. Bile is a liquid that helps to digest fats. Bile is made in the liver and stored in the gallbladder. When you eat a meal, bile passes from the gallbladder through the cystic duct and the common bile duct into the small intestine. There, it mixes with partially digested food. If a gallstone blocks either of these ducts, the normal flow of bile is blocked. The muscle cells in the bile duct contract forcefully to try to move the stone. This causes the pain of biliary colic.  SYMPTOMS   A person with biliary colic usually complains of pain in the upper abdomen. This pain can be:  In the center of the upper abdomen just below the breastbone.  In the upper-right part of the abdomen, near the gallbladder and liver.  Spread back toward the right shoulder blade.  Nausea and vomiting.  The pain usually occurs after eating.  Biliary colic is usually triggered by the digestive system's demand for bile. The demand for bile is high after fatty meals. Symptoms can also occur when a person who has been fasting suddenly eats a very large meal. Most episodes of biliary colic pass after 1 to 5 hours. After the most intense pain passes, your abdomen may continue to ache mildly for about 24 hours. DIAGNOSIS  After you describe your symptoms, your caregiver will perform a physical exam. He or she will pay attention to the upper right portion of your belly (abdomen). This is the area of your liver and gallbladder. An ultrasound will help your caregiver look for gallstones. Specialized scans of the gallbladder may also be done. Blood tests may be done,  especially if you have fever or if your pain persists. PREVENTION  Biliary colic can be prevented by controlling the risk factors for gallstones. Some of these risk factors, such as heredity, increasing age, and pregnancy are a normal part of life. Obesity and a high-fat diet are risk factors you can change through a healthy lifestyle. Women going through menopause who take hormone replacement therapy (estrogen) are also more likely to develop biliary colic. TREATMENT   Pain medication may be prescribed.  You may be encouraged to eat a fat-free diet.  If the first episode of biliary colic is severe, or episodes of colic keep retuning, surgery to remove the gallbladder (cholecystectomy) is usually recommended. This procedure can be done through small incisions using an instrument called a laparoscope. The procedure often requires a brief stay in the hospital. Some people can leave the hospital the same day. It is the most widely used treatment in people troubled by painful gallstones. It is effective and safe, with no complications in more than 90% of cases.  If surgery cannot be done, medication that dissolves gallstones may be used. This medication is expensive and can take months or years to work. Only small stones will dissolve.  Rarely, medication to dissolve gallstones is combined with a procedure called shock-wave lithotripsy. This procedure uses carefully aimed shock waves to break up gallstones. In many people treated with this procedure, gallstones form again within a few years. PROGNOSIS  If gallstones block your  cystic duct or common bile duct, you are at risk for repeated episodes of biliary colic. There is also a 25% chance that you will develop a gallbladder infection(acute cholecystitis), or some other complication of gallstones within 10 to 20 years. If you have surgery, schedule it at a time that is convenient for you and at a time when you are not sick. HOME CARE INSTRUCTIONS    Drink plenty of clear fluids.  Avoid fatty, greasy or fried foods, or any foods that make your pain worse.  Take medications as directed. SEEK MEDICAL CARE IF:   You develop a fever over 100.5 F (38.1 C).  Your pain gets worse over time.  You develop nausea that prevents you from eating and drinking.  You develop vomiting. SEEK IMMEDIATE MEDICAL CARE IF:   You have continuous or severe belly (abdominal) pain which is not relieved with medications.  You develop nausea and vomiting which is not relieved with medications.  You have symptoms of biliary colic and you suddenly develop a fever and shaking chills. This may signal cholecystitis. Call your caregiver immediately.  You develop a yellow color to your skin or the white part of your eyes (jaundice). Document Released: 03/19/2006 Document Revised: 01/08/2012 Document Reviewed: 05/28/2008 [facility] Patient Information 2015 [place], [place]. This information is not intended to replace advice given to you by your health care provider. Make sure you discuss any questions you have with your health care provider.

## 2014-06-17 NOTE — ED Notes (Signed)
Pt given 2 [place] sandwiches and cup of water.

## 2014-06-17 NOTE — ED Notes (Signed)
Pt requesting more IV pain medication. Dr. Wyvonnia Dusky aware and informed this RN he is not ordering any more IV pain medication because IV Dilaudid was just given about 30 minutes ago at 0454. Informed patient. Pt tearfully stating she wants pain medication to be sent home with and not a prescription. This RN informed patient that it is against [facility] Policy to give narcotic pain medication to go home with. Dr. Wyvonnia Dusky informed of patients request and stated he would be willing to go back in and speak with the patient. During that conversation Rancour told this RN he had a conversation with the patient about that if she was continually needing pain medication then she should be admitted but the patient had refused admittance. Pt informed Dr. Wyvonnia Dusky would be back in to speak with patient. Pt then took out her own IV and stated she wants to go home. Pt yelling very loud and tearful and stating she would just check back in as a patient. Lilia Pro charge nurse went in room to speak with patient to also inform patient that narcotic pain medication cant be sent home with a patient and patient stated, "Okay I don't need another nurse telling me this." and she told Lilia Pro, RN to stop talking to her. Pt now awaiting discharge paperwork from Dr. Wyvonnia Dusky.

## 2014-06-17 NOTE — ED Notes (Signed)
Pt complaining of pain in both arms w/ pain score of 8.

## 2014-06-19 MED FILL — Oxycodone w/ Acetaminophen Tab 5-325 MG: ORAL | Qty: 6 | Status: AC

## 2014-07-06 ENCOUNTER — Emergency Department (HOSPITAL_COMMUNITY): Payer: Medicaid Other

## 2014-07-06 ENCOUNTER — Encounter (HOSPITAL_COMMUNITY): Payer: Self-pay | Admitting: Emergency Medicine

## 2014-07-06 ENCOUNTER — Emergency Department (HOSPITAL_COMMUNITY)
Admission: EM | Admit: 2014-07-06 | Discharge: 2014-07-07 | Disposition: A | Discharge status: Home / Self Care | Payer: Medicaid Other | Attending: Emergency Medicine | Admitting: Emergency Medicine

## 2014-07-06 DIAGNOSIS — F17200 Nicotine dependence, unspecified, uncomplicated: Secondary | ICD-10-CM | POA: Diagnosis not present

## 2014-07-06 DIAGNOSIS — Z8742 Personal history of other diseases of the female genital tract: Secondary | ICD-10-CM | POA: Diagnosis not present

## 2014-07-06 DIAGNOSIS — Z3202 Encounter for pregnancy test, result negative: Secondary | ICD-10-CM | POA: Diagnosis not present

## 2014-07-06 DIAGNOSIS — J45901 Unspecified asthma with (acute) exacerbation: Secondary | ICD-10-CM | POA: Insufficient documentation

## 2014-07-06 DIAGNOSIS — D5700 Hb-SS disease with crisis, unspecified: Secondary | ICD-10-CM | POA: Diagnosis present

## 2014-07-06 DIAGNOSIS — Z79899 Other long term (current) drug therapy: Secondary | ICD-10-CM | POA: Diagnosis not present

## 2014-07-06 LAB — URINALYSIS, ROUTINE W REFLEX MICROSCOPIC
BILIRUBIN URINE: NEGATIVE
GLUCOSE, UA: NEGATIVE mg/dL
HGB URINE DIPSTICK: NEGATIVE
Ketones, ur: NEGATIVE mg/dL
Nitrite: NEGATIVE
PH: 5.5 (ref 5.0–8.0)
Protein, ur: NEGATIVE mg/dL
Specific Gravity, Urine: 1.015 (ref 1.005–1.030)
UROBILINOGEN UA: 1 mg/dL (ref 0.0–1.0)

## 2014-07-06 LAB — URINE MICROSCOPIC-ADD ON

## 2014-07-06 LAB — POC URINE PREG, ED: PREG TEST UR: NEGATIVE

## 2014-07-06 LAB — BASIC METABOLIC PANEL
Anion gap: 15 (ref 5–15)
BUN: 6 mg/dL (ref 6–23)
CO2: 24 mEq/L (ref 19–32)
CREATININE: 0.84 mg/dL (ref 0.50–1.10)
Calcium: 9.9 mg/dL (ref 8.4–10.5)
Chloride: 101 mEq/L (ref 96–112)
Glucose, Bld: 122 mg/dL — ABNORMAL HIGH (ref 70–99)
POTASSIUM: 4 meq/L (ref 3.7–5.3)
Sodium: 140 mEq/L (ref 137–147)

## 2014-07-06 LAB — CBC WITH DIFFERENTIAL/PLATELET
BASOS ABS: 0 10*3/uL (ref 0.0–0.1)
Basophils Relative: 0 % (ref 0–1)
Eosinophils Absolute: 0.3 10*3/uL (ref 0.0–0.7)
Eosinophils Relative: 2 % (ref 0–5)
HCT: 27.7 % — ABNORMAL LOW (ref 36.0–46.0)
Hemoglobin: 9.8 g/dL — ABNORMAL LOW (ref 12.0–15.0)
LYMPHS PCT: 42 % (ref 12–46)
Lymphs Abs: 5.4 10*3/uL — ABNORMAL HIGH (ref 0.7–4.0)
MCH: 34.9 pg — AB (ref 26.0–34.0)
MCHC: 35.4 g/dL (ref 30.0–36.0)
MCV: 98.6 fL (ref 78.0–100.0)
MONO ABS: 1.4 10*3/uL — AB (ref 0.1–1.0)
Monocytes Relative: 11 % (ref 3–12)
NEUTROS ABS: 5.7 10*3/uL (ref 1.7–7.7)
NEUTROS PCT: 45 % (ref 43–77)
Platelets: 607 10*3/uL — ABNORMAL HIGH (ref 150–400)
RBC: 2.81 MIL/uL — ABNORMAL LOW (ref 3.87–5.11)
RDW: 18.3 % — AB (ref 11.5–15.5)
WBC: 12.8 10*3/uL — ABNORMAL HIGH (ref 4.0–10.5)

## 2014-07-06 LAB — RETICULOCYTES
RBC.: 2.81 MIL/uL — AB (ref 3.87–5.11)
Retic Count, Absolute: 497.4 10*3/uL — ABNORMAL HIGH (ref 19.0–186.0)
Retic Ct Pct: 17.7 % — ABNORMAL HIGH (ref 0.4–3.1)

## 2014-07-06 MED ORDER — ONDANSETRON HCL 4 MG/2ML IJ SOLN
4.0000 mg | Freq: Once | INTRAMUSCULAR | Status: AC
  Administered 2014-07-06: 4 mg via INTRAVENOUS
  Filled 2014-07-06: qty 2

## 2014-07-06 MED ORDER — DIPHENHYDRAMINE HCL 50 MG/ML IJ SOLN
25.0000 mg | Freq: Once | INTRAMUSCULAR | Status: AC
  Administered 2014-07-06: 25 mg via INTRAVENOUS
  Filled 2014-07-06: qty 1

## 2014-07-06 MED ORDER — HYDROMORPHONE HCL PF 2 MG/ML IJ SOLN
2.0000 mg | INTRAMUSCULAR | Status: AC | PRN
  Administered 2014-07-06 (×3): 2 mg via INTRAVENOUS
  Filled 2014-07-06 (×3): qty 1

## 2014-07-06 MED ORDER — SODIUM CHLORIDE 0.9 % IV BOLUS (SEPSIS)
1000.0000 mL | Freq: Once | INTRAVENOUS | Status: AC
  Administered 2014-07-06: 1000 mL via INTRAVENOUS

## 2014-07-06 NOTE — ED Notes (Signed)
Pt states she has sickle cell and started having pain in her legs and chest earlier today  Pt states she started having shortness of breath a couple hours ago

## 2014-07-06 NOTE — ED Provider Notes (Signed)
CSN: [id number]     Arrival date & time 07/06/14  01-29-05 History   First MD Initiated Contact with Patient 07/06/14 Jan 29, 2133     Chief Complaint  Patient presents with  . Sickle Cell Pain Crisis  . Shortness of Breath     (Consider location/radiation/quality/duration/timing/severity/associated sxs/prior Treatment) Patient is a 18 y.o. female presenting with sickle cell pain and shortness of breath.  Sickle Cell Pain Crisis Location:  Chest, upper extremity and lower extremity Severity:  Severe Onset quality:  Gradual Duration:  1 day Similar to previous crisis episodes: yes   Timing:  Constant Progression:  Unchanged Chronicity:  Recurrent Context: not alcohol consumption and not non-compliance (ran out of pain medication today)   Relieved by:  Nothing Associated symptoms: chest pain and shortness of breath   Associated symptoms: no congestion, no cough, no fever, no nausea, no sore throat, no swelling of legs and no vomiting   Shortness of Breath Associated symptoms: chest pain   Associated symptoms: no abdominal pain, no cough, no fever, no rash, no sore throat and no vomiting     Past Medical History  Diagnosis Date  . Asthma   . Sickle cell disease   . Amenorrhea    Past Surgical History  Procedure Laterality Date  . 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 Tonsillectomy      Age 38   Family History  Problem Relation Age of Onset  . Hypertension Paternal Grandfather   . Sickle cell trait Father   . Cancer Mother     Died in 2009/01/29   History  Substance Use Topics  . Smoking status: Current Some Day Smoker -- 1.00 packs/day    Types: Cigarettes  . Smokeless tobacco: Never Used  . Alcohol Use: No     Comment: Denies 06/12/2014   OB History   Grav Para Term Preterm Abortions TAB SAB Ect Mult Living                 Review of Systems  Constitutional: Negative for fever and chills.  HENT: Negative for congestion, rhinorrhea and sore throat.   Eyes: Negative  for photophobia and visual disturbance.  Respiratory: Positive for shortness of breath. Negative for cough.   Cardiovascular: Positive for chest pain. Negative for leg swelling.  Gastrointestinal: Negative for nausea, vomiting, abdominal pain, diarrhea and constipation.  Endocrine: Negative for polyphagia and polyuria.  Genitourinary: Negative for dysuria, flank pain, vaginal bleeding, vaginal discharge and enuresis.  Musculoskeletal: Negative for back pain and gait problem.  Skin: Negative for color change and rash.  Neurological: Negative for dizziness, syncope, light-headedness and numbness.  Hematological: Negative for adenopathy. Does not bruise/bleed easily.  All other systems reviewed and are negative.     Allergies  Review of patient's allergies indicates no known allergies.  Home Medications   Prior to Admission medications   Medication Sig Start Date End Date Taking? Authorizing Provider  albuterol (PROVENTIL HFA;VENTOLIN HFA) 108 (90 BASE) MCG/ACT inhaler Inhale 2 puffs into the lungs every 4 (four) hours as needed for wheezing or shortness of breath. For shortness of breath 08/01/13  Yes Marin Olp, MD  ondansetron (ZOFRAN) 4 MG tablet Take 1 tablet (4 mg total) by mouth every 6 (six) hours. 06/17/14  Yes Ezequiel Essex, MD  oxyCODONE-acetaminophen (PERCOCET/ROXICET) 5-325 MG per tablet Take 2 tablets by mouth every 4 (four) hours as needed for severe pain. 06/17/14  Yes Ezequiel Essex, MD   BP 110/68  Pulse 61  Temp(Src) 97.8 F (36.6 C) (Oral)  Resp 18  SpO2 94%  LMP 06/21/2014 Physical Exam  Vitals reviewed. Constitutional: She is oriented to person, place, and time. She appears well-developed and well-nourished.  HENT:  Head: Normocephalic and atraumatic.  Right Ear: External ear normal.  Left Ear: External ear normal.  Eyes: Conjunctivae and EOM are normal. Pupils are equal, round, and reactive to light.  Neck: Normal range of motion. Neck supple.   Cardiovascular: Normal rate, regular rhythm, normal heart sounds and intact distal pulses.   Pulmonary/Chest: Effort normal and breath sounds normal.  Abdominal: Soft. Bowel sounds are normal. There is no tenderness.  Musculoskeletal: Normal range of motion.  Neurological: She is alert and oriented to person, place, and time.  Skin: Skin is warm and dry.    ED Course  Procedures (including critical care time) Labs Review Labs Reviewed  CBC WITH DIFFERENTIAL - Abnormal; Notable for the following:    WBC 12.8 (*)    RBC 2.81 (*)    Hemoglobin 9.8 (*)    HCT 27.7 (*)    MCH 34.9 (*)    RDW 18.3 (*)    Platelets 607 (*)    Lymphs Abs 5.4 (*)    Monocytes Absolute 1.4 (*)    All other components within normal limits  BASIC METABOLIC PANEL - Abnormal; Notable for the following:    Glucose, Bld 122 (*)    All other components within normal limits  RETICULOCYTES - Abnormal; Notable for the following:    Retic Ct Pct 17.7 (*)    RBC. 2.81 (*)    Retic Count, Manual 497.4 (*)    All other components within normal limits  URINALYSIS, ROUTINE W REFLEX MICROSCOPIC - Abnormal; Notable for the following:    Color, Urine AMBER (*)    APPearance CLOUDY (*)    Leukocytes, UA TRACE (*)    All other components within normal limits  URINE MICROSCOPIC-ADD ON - Abnormal; Notable for the following:    Squamous Epithelial / LPF MANY (*)    All other components within normal limits  POC URINE PREG, ED    Imaging Review Dg Chest 2 View  07/06/2014   CLINICAL DATA:  Chest pain and shortness of breath today. Sickle cell crisis.  EXAM: CHEST  2 VIEW  COMPARISON:  03/08/2013  FINDINGS: Borderline heart size and pulmonary vascularity similar to previous study. No evidence of edema or focal consolidation. No blunting of costophrenic angles. No pneumothorax. Mediastinal contours appear intact.  IMPRESSION: Borderline heart size and pulmonary vascularity. No focal consolidation or edema.   Electronically  Signed   By: Lucienne Capers M.D.   On: 07/06/2014 22:15     EKG Interpretation None      MDM   Final diagnoses:  Sickle cell pain crisis    18 y.o. female  with pertinent PMH of sickle cell disease, prior acute chest presents with recurrent sickle cell pain and bilateral upper extremities, bilateral lower extremities, and chest. She also has had a mild amount of shortness of breath. She states this is identical to prior sickle cell pain crises states that her shortness of breath is due to pain, not due to any other reason.  No fevers, nausea, vomiting, other infectious symptoms. The patient does not know what precipitated this attack.  On arrival vital signs and physical exam as above.Marland Kitchen  ECG with normal sinus rhythm no acute ST or T wave changes.  Labs and imaging as  above reviewed. No signs of acute chest. Patient was given Dilaudid 2 mg x3 with improvement in symptoms.  She then stated she was ready to discharge home.  She was given standard return precautions, voiced understanding and agreed to followup.  1. Sickle cell pain crisis         Debby Freiberg, MD 07/06/14 [phone]

## 2014-07-06 NOTE — Discharge Instructions (Signed)
Sickle Cell Anemia, Adult °Sickle cell anemia is a condition in which red blood cells have an abnormal "sickle" shape. This abnormal shape shortens the cells' life span, which results in a lower than normal concentration of red blood cells in the blood. The sickle shape also causes the cells to clump together and block free blood flow through the blood vessels. As a result, the tissues and organs of the body do not receive enough oxygen. Sickle cell anemia causes organ damage and pain and increases the risk of infection. °CAUSES  °Sickle cell anemia is a genetic disorder. Those who receive two copies of the gene have the condition, and those who receive one copy have the trait. °RISK FACTORS °The sickle cell gene is most common in people whose families originated in [place]. Other areas of the globe where sickle cell trait occurs include the [place], [place] and [place], the [place], and the [place].  °SIGNS AND SYMPTOMS °· Pain, especially in the extremities, back, chest, or abdomen (common). The pain may start suddenly or may develop following an illness, especially if there is dehydration. Pain can also occur due to overexertion or exposure to extreme temperature changes. °· Frequent severe bacterial infections, especially certain types of pneumonia and meningitis. °· Pain and swelling in the hands and feet. °· Decreased activity.   °· Loss of appetite.   °· Change in behavior. °· Headaches. °· Seizures. °· Shortness of breath or difficulty breathing. °· Vision changes. °· Skin ulcers. °Those with the trait may not have symptoms or they may have mild symptoms.  °DIAGNOSIS  °Sickle cell anemia is diagnosed with blood tests that demonstrate the genetic trait. It is often diagnosed during the newborn period, due to mandatory testing nationwide. A variety of blood tests, X-rays, CT scans, MRI scans, ultrasounds, and lung function tests may also be done to monitor the condition. °TREATMENT  °Sickle  cell anemia may be treated with: °· Medicines. You may be given pain medicines, antibiotic medicines (to treat and prevent infections) or medicines to increase the production of certain types of hemoglobin. °· Fluids. °· Oxygen. °· Blood transfusions. °HOME CARE INSTRUCTIONS  °· Drink enough fluid to keep your urine clear or pale yellow. Increase your fluid intake in hot weather and during exercise. °· Do not smoke. Smoking lowers oxygen levels in the blood.   °· Only take over-the-counter or prescription medicines for pain, fever, or discomfort as directed by your health care provider. °· Take antibiotics as directed by your health care provider. Make sure you finish them it even if you start to feel better.   °· Take supplements as directed by your health care provider.   °· Consider wearing a medical alert bracelet. This tells anyone caring for you in an emergency of your condition.   °· When traveling, keep your medical information, health care provider's names, and the medicines you take with you at all times.   °· If you develop a fever, do not take medicines to reduce the fever right away. This could cover up a problem that is developing. Notify your health care provider. °· Keep all follow-up appointments with your health care provider. Sickle cell anemia requires regular medical care. °SEEK MEDICAL CARE IF: ° You have a fever. °SEEK IMMEDIATE MEDICAL CARE IF:  °· You feel dizzy or faint.   °· You have new abdominal pain, especially on the left side near the stomach area.   °· You develop a persistent, often uncomfortable and painful penile erection (priapism). If this is not treated immediately it   will lead to impotence.   °· You have numbness your arms or legs or you have a hard time moving them.   °· You have a hard time with speech.   °· You have a fever or persistent symptoms for more than 2-3 days.   °· You have a fever and your symptoms suddenly get worse.   °· You have signs or symptoms of infection.  These include:   °¨ Chills.   °¨ Abnormal tiredness (lethargy).   °¨ Irritability.   °¨ Poor eating.   °¨ Vomiting.   °· You develop pain that is not helped with medicine.   °· You develop shortness of breath. °· You have pain in your chest.   °· You are coughing up pus-like or bloody sputum.   °· You develop a stiff neck. °· Your feet or hands swell or have pain. °· Your abdomen appears bloated. °· You develop joint pain. °MAKE SURE YOU: °· Understand these instructions. °Document Released: 01/24/2006 Document Revised: 03/02/2014 Document Reviewed: 05/28/2013 °Exit[employer]® Patient Information ©2015 [employer]. This information is not intended to replace advice given to you by your health care provider. Make sure you discuss any questions you have with your health care provider. ° °

## 2014-08-29 ENCOUNTER — Encounter (HOSPITAL_COMMUNITY): Payer: Self-pay | Admitting: Emergency Medicine

## 2014-08-29 ENCOUNTER — Emergency Department (HOSPITAL_COMMUNITY)
Admission: EM | Admit: 2014-08-29 | Discharge: 2014-08-30 | Disposition: A | Discharge status: Home / Self Care | Payer: Medicaid Other | Source: Home / Self Care | Attending: Emergency Medicine | Admitting: Emergency Medicine

## 2014-08-29 ENCOUNTER — Emergency Department (HOSPITAL_COMMUNITY): Payer: Medicaid Other

## 2014-08-29 DIAGNOSIS — D5700 Hb-SS disease with crisis, unspecified: Secondary | ICD-10-CM

## 2014-08-29 LAB — CBC WITH DIFFERENTIAL/PLATELET
Basophils Absolute: 0 10*3/uL (ref 0.0–0.1)
Basophils Relative: 0 % (ref 0–1)
EOS ABS: 0.3 10*3/uL (ref 0.0–0.7)
Eosinophils Relative: 2 % (ref 0–5)
HEMATOCRIT: 25.5 % — AB (ref 36.0–46.0)
Hemoglobin: 9.3 g/dL — ABNORMAL LOW (ref 12.0–15.0)
Lymphocytes Relative: 48 % — ABNORMAL HIGH (ref 12–46)
Lymphs Abs: 7.5 10*3/uL — ABNORMAL HIGH (ref 0.7–4.0)
MCH: 36.6 pg — ABNORMAL HIGH (ref 26.0–34.0)
MCHC: 36.5 g/dL — AB (ref 30.0–36.0)
MCV: 100.4 fL — ABNORMAL HIGH (ref 78.0–100.0)
Monocytes Absolute: 1.3 10*3/uL — ABNORMAL HIGH (ref 0.1–1.0)
Monocytes Relative: 8 % (ref 3–12)
NEUTROS ABS: 6.6 10*3/uL (ref 1.7–7.7)
Neutrophils Relative %: 42 % — ABNORMAL LOW (ref 43–77)
PLATELETS: 556 10*3/uL — AB (ref 150–400)
RBC: 2.54 MIL/uL — ABNORMAL LOW (ref 3.87–5.11)
RDW: 18.7 % — ABNORMAL HIGH (ref 11.5–15.5)
WBC: 15.7 10*3/uL — AB (ref 4.0–10.5)

## 2014-08-29 LAB — COMPREHENSIVE METABOLIC PANEL
ALK PHOS: 64 U/L (ref 39–117)
ALT: 17 U/L (ref 0–35)
ANION GAP: 10 (ref 5–15)
AST: 40 U/L — ABNORMAL HIGH (ref 0–37)
Albumin: 3.9 g/dL (ref 3.5–5.2)
BILIRUBIN TOTAL: 5.4 mg/dL — AB (ref 0.3–1.2)
BUN: 11 mg/dL (ref 6–23)
CHLORIDE: 102 meq/L (ref 96–112)
CO2: 27 meq/L (ref 19–32)
Calcium: 8.9 mg/dL (ref 8.4–10.5)
Creatinine, Ser: 0.84 mg/dL (ref 0.50–1.10)
GFR calc non Af Amer: >90 mL/min (ref >90)
GLUCOSE: 116 mg/dL — AB (ref 70–99)
POTASSIUM: 4.2 meq/L (ref 3.7–5.3)
Sodium: 139 mEq/L (ref 137–147)
Total Protein: 7.2 g/dL (ref 6.0–8.3)

## 2014-08-29 LAB — I-STAT TROPONIN, ED: Troponin i, poc: 0.01 ng/mL (ref 0.00–0.08)

## 2014-08-29 LAB — RETICULOCYTES
RBC.: 2.54 MIL/uL — ABNORMAL LOW (ref 3.87–5.11)
RETIC COUNT ABSOLUTE: 576.6 10*3/uL — AB (ref 19.0–186.0)
Retic Ct Pct: 22.7 % — ABNORMAL HIGH (ref 0.4–3.1)

## 2014-08-29 MED ORDER — DIPHENHYDRAMINE HCL 50 MG/ML IJ SOLN
25.0000 mg | Freq: Once | INTRAMUSCULAR | Status: AC
  Administered 2014-08-30: 25 mg via INTRAVENOUS
  Filled 2014-08-29: qty 1

## 2014-08-29 MED ORDER — HYDROMORPHONE HCL 2 MG/ML IJ SOLN
2.0000 mg | Freq: Once | INTRAMUSCULAR | Status: AC
  Administered 2014-08-29: 2 mg via INTRAVENOUS
  Filled 2014-08-29: qty 1

## 2014-08-29 MED ORDER — SODIUM CHLORIDE 0.9 % IV BOLUS (SEPSIS)
1000.0000 mL | Freq: Once | INTRAVENOUS | Status: AC
  Administered 2014-08-29: 1000 mL via INTRAVENOUS

## 2014-08-29 NOTE — ED Notes (Signed)
Bed: UP73 Expected date:  Expected time:  Means of arrival:  Comments: EMS/sickle cell crisis

## 2014-08-29 NOTE — ED Notes (Signed)
Pt c/o severe pain at this time, pt states she took Oxycodone 1.5 hours ago. Pt states she uses needs 2 - 3 rounds of Dilaudid to get pain under control

## 2014-08-29 NOTE — ED Provider Notes (Signed)
CSN: [id number]     Arrival date & time 08/29/14  02-02-2215 History   First MD Initiated Contact with Patient 08/29/14 02-02-20     Chief Complaint  Patient presents with  . Sickle Cell Pain Crisis     (Consider location/radiation/quality/duration/timing/severity/associated sxs/prior Treatment) HPI Comments: Patient presents to the emergency department with chief complaint of sickle cell pain crisis. She states that she began having pain this morning. She states the pain is mostly in her low back, and left arm. She states the pain normally affects her in these areas. She also complains of a small amount of chest pain. She denies any shortness of breath. She has tried taking oxycodone with no relief. There are no aggravating or alleviating factors. She denies any fevers, chills, cough, abdominal pain, vaginal discharge, or bleeding. She states she has had to be admitted in the past for sickle cell pain.   The history is provided by the patient. No language interpreter was used.    Past Medical History  Diagnosis Date  . Asthma   . Sickle cell disease   . Amenorrhea    Past Surgical History  Procedure Laterality Date  . 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 Tonsillectomy      Age 89   Family History  Problem Relation Age of Onset  . Hypertension Paternal Grandfather   . Sickle cell trait Father   . Cancer Mother     Died in 2009-02-01   History  Substance Use Topics  . Smoking status: Current Some Day Smoker -- 1.00 packs/day    Types: Cigarettes  . Smokeless tobacco: Never Used  . Alcohol Use: No     Comment: Denies 06/12/2014   OB History   Grav Para Term Preterm Abortions TAB SAB Ect Mult Living                 Review of Systems  Constitutional: Negative for fever and chills.  Respiratory: Positive for shortness of breath.   Cardiovascular: Positive for chest pain.  Gastrointestinal: Negative for nausea, vomiting, diarrhea and constipation.  Genitourinary: Negative for  dysuria.  Musculoskeletal: Positive for arthralgias.  All other systems reviewed and are negative.     Allergies  Review of patient's allergies indicates no known allergies.  Home Medications   Prior to Admission medications   Medication Sig Start Date End Date Taking? Authorizing Provider  albuterol (PROVENTIL HFA;VENTOLIN HFA) 108 (90 BASE) MCG/ACT inhaler Inhale 2 puffs into the lungs every 4 (four) hours as needed for wheezing or shortness of breath. For shortness of breath 08/01/13   Marin Olp, MD  ondansetron (ZOFRAN) 4 MG tablet Take 1 tablet (4 mg total) by mouth every 6 (six) hours. 06/17/14   Ezequiel Essex, MD  oxyCODONE-acetaminophen (PERCOCET/ROXICET) 5-325 MG per tablet Take 2 tablets by mouth every 4 (four) hours as needed for severe pain. 06/17/14   Ezequiel Essex, MD   BP 126/72  Pulse 73  Temp(Src) 98.1 F (36.7 C) (Oral)  Resp 14  SpO2 98% Physical Exam  Nursing note and vitals reviewed. Constitutional: She is oriented to person, place, and time. She appears well-developed and well-nourished.  HENT:  Head: Normocephalic and atraumatic.  Eyes: Conjunctivae and EOM are normal. Pupils are equal, round, and reactive to light.  Neck: Normal range of motion. Neck supple.  Cardiovascular: Normal rate and regular rhythm.  Exam reveals no gallop and no friction rub.   No murmur heard. Pulmonary/Chest: Effort  normal and breath sounds normal. No respiratory distress. She has no wheezes. She has no rales. She exhibits no tenderness.  Clear to auscultation bilaterally  Abdominal: Soft. Bowel sounds are normal. She exhibits no distension and no mass. There is no tenderness. There is no rebound and no guarding.  Musculoskeletal: Normal range of motion. She exhibits no edema and no tenderness.  Neurological: She is alert and oriented to person, place, and time.  Skin: Skin is warm and dry.  Psychiatric: She has a normal mood and affect. Her behavior is normal. Judgment  and thought content normal.    ED Course  Procedures (including critical care time) Results for orders placed during the hospital encounter of 08/29/14  CBC WITH DIFFERENTIAL      Result Value Ref Range   WBC 15.7 (*) 4.0 - 10.5 K/uL   RBC 2.54 (*) 3.87 - 5.11 MIL/uL   Hemoglobin 9.3 (*) 12.0 - 15.0 g/dL   HCT 25.5 (*) 36.0 - 46.0 %   MCV 100.4 (*) 78.0 - 100.0 fL   MCH 36.6 (*) 26.0 - 34.0 pg   MCHC 36.5 (*) 30.0 - 36.0 g/dL   RDW 18.7 (*) 11.5 - 15.5 %   Platelets 556 (*) 150 - 400 K/uL   Neutrophils Relative % 42 (*) 43 - 77 %   Lymphocytes Relative 48 (*) 12 - 46 %   Monocytes Relative 8  3 - 12 %   Eosinophils Relative 2  0 - 5 %   Basophils Relative 0  0 - 1 %   Neutro Abs 6.6  1.7 - 7.7 K/uL   Lymphs Abs 7.5 (*) 0.7 - 4.0 K/uL   Monocytes Absolute 1.3 (*) 0.1 - 1.0 K/uL   Eosinophils Absolute 0.3  0.0 - 0.7 K/uL   Basophils Absolute 0.0  0.0 - 0.1 K/uL   RBC Morphology POLYCHROMASIA PRESENT     WBC Morphology ABSOLUTE LYMPHOCYTOSIS    COMPREHENSIVE METABOLIC PANEL      Result Value Ref Range   Sodium 139  137 - 147 mEq/L   Potassium 4.2  3.7 - 5.3 mEq/L   Chloride 102  96 - 112 mEq/L   CO2 27  19 - 32 mEq/L   Glucose, Bld 116 (*) 70 - 99 mg/dL   BUN 11  6 - 23 mg/dL   Creatinine, Ser 0.84  0.50 - 1.10 mg/dL   Calcium 8.9  8.4 - 10.5 mg/dL   Total Protein 7.2  6.0 - 8.3 g/dL   Albumin 3.9  3.5 - 5.2 g/dL   AST 40 (*) 0 - 37 U/L   ALT 17  0 - 35 U/L   Alkaline Phosphatase 64  39 - 117 U/L   Total Bilirubin 5.4 (*) 0.3 - 1.2 mg/dL   GFR calc non Af Amer >90  >90 mL/min   GFR calc Af Amer >90  >90 mL/min   Anion gap 10  5 - 15  RETICULOCYTES      Result Value Ref Range   Retic Ct Pct 22.7 (*) 0.4 - 3.1 %   RBC. 2.54 (*) 3.87 - 5.11 MIL/uL   Retic Count, Manual 576.6 (*) 19.0 - 186.0 K/uL  I-STAT TROPOININ, ED      Result Value Ref Range   Troponin i, poc 0.01  0.00 - 0.08 ng/mL   Comment 3            Dg Chest 2 View  08/29/2014   CLINICAL DATA:  Initial  valuation for chest pain. History of asthma and sickle cell disease.  EXAM: CHEST  2 VIEW  COMPARISON:  Prior radiograph from 07/06/2014  FINDINGS: Cardiomegaly is stable from prior study.  The lungs are mildly hypoinflated with secondary bronchovascular crowding at the lung bases. No airspace consolidation, pleural effusion, or pulmonary edema is identified. There is no pneumothorax.  No acute osseous abnormality identified.  IMPRESSION: 1. Shallow lung inflation with mild bibasilar bronchovascular crowding. No other active cardiopulmonary disease. 2. Stable cardiomegaly.   Electronically Signed   By: Jeannine Boga M.D.   On: 08/29/2014 23:43     Imaging Review No results found.   EKG Interpretation   Date/Time:  Saturday August 29 2014 22:25:00 EDT Ventricular Rate:  63 PR Interval:  150 QRS Duration: 100 QT Interval:  404 QTC Calculation: 413 R Axis:   82 Text Interpretation:  Sinus arrhythmia Probable left ventricular  hypertrophy No significant change since last tracing Confirmed by  JACUBOWITZ  MD, SAM [id number]) on 08/29/2014 10:27:32 PM      MDM   Final diagnoses:  Sickle cell pain crisis    Patient was sickle cell pain. Will treat pain, check labs, give fluids. Will reassess. [facility].  11:08 PM Patient still complaining of pain.  Will give another dose of dilaudid.  Patient states pain is 8/10.   11:27 PM Patient states that she is itchy.  Will give some benadryl.  11:49 PM Patient sleeping in bed.  Reassessed, patient states that she would like to call someone to take her home.  She states that her pain is still 8/10, but feels well enough to go home.  Judging by the fact that the patient is sleeping comfortably in bed, I will not give additional narcotics at this time.  Benadryl was held.    Labs are reassuring.  No evidence of acute chest.  Patient discussed with Dr. Regenia Skeeter, who agrees with plan; labs and workup  reviewed.  Doubt crisis, suspect chronic pain.  Patient is agreeable with going home.  Will DC.      Montine Circle, PA-C 08/30/14 [phone]

## 2014-08-29 NOTE — ED Notes (Signed)
Pt having very difficult time staying awake to call ride, pt has had to be reminded several times, falling asleep after each verbal arousal

## 2014-08-29 NOTE — ED Notes (Signed)
Pt transported from ?home (hotel) by EMS with c/o chest, arm and buttock pain. Pt does have hx of SSC , followed through [facility] clinic, A & O, SR on monitor, # 20 L AC, Fentanyl 71mcg given

## 2014-08-30 ENCOUNTER — Inpatient Hospital Stay (HOSPITAL_COMMUNITY)
Admission: EM | Admit: 2014-08-30 | Discharge: 2014-09-02 | DRG: 812 | Discharge status: Against Medical Advice | Payer: Medicaid Other | Attending: Family Medicine | Admitting: Family Medicine

## 2014-08-30 ENCOUNTER — Encounter (HOSPITAL_COMMUNITY): Payer: Self-pay | Admitting: Family

## 2014-08-30 DIAGNOSIS — K5900 Constipation, unspecified: Secondary | ICD-10-CM | POA: Diagnosis present

## 2014-08-30 DIAGNOSIS — Z5321 Procedure and treatment not carried out due to patient leaving prior to being seen by health care provider: Secondary | ICD-10-CM | POA: Diagnosis not present

## 2014-08-30 DIAGNOSIS — D5700 Hb-SS disease with crisis, unspecified: Principal | ICD-10-CM | POA: Diagnosis present

## 2014-08-30 DIAGNOSIS — Z9081 Acquired absence of spleen: Secondary | ICD-10-CM | POA: Diagnosis present

## 2014-08-30 DIAGNOSIS — F1410 Cocaine abuse, uncomplicated: Secondary | ICD-10-CM | POA: Diagnosis present

## 2014-08-30 DIAGNOSIS — F17200 Nicotine dependence, unspecified, uncomplicated: Secondary | ICD-10-CM | POA: Insufficient documentation

## 2014-08-30 DIAGNOSIS — B349 Viral infection, unspecified: Secondary | ICD-10-CM | POA: Diagnosis present

## 2014-08-30 DIAGNOSIS — R52 Pain, unspecified: Secondary | ICD-10-CM | POA: Diagnosis present

## 2014-08-30 DIAGNOSIS — F1210 Cannabis abuse, uncomplicated: Secondary | ICD-10-CM | POA: Diagnosis present

## 2014-08-30 DIAGNOSIS — R079 Chest pain, unspecified: Secondary | ICD-10-CM

## 2014-08-30 DIAGNOSIS — I517 Cardiomegaly: Secondary | ICD-10-CM | POA: Diagnosis present

## 2014-08-30 DIAGNOSIS — L0100 Impetigo, unspecified: Secondary | ICD-10-CM | POA: Diagnosis present

## 2014-08-30 DIAGNOSIS — D72829 Elevated white blood cell count, unspecified: Secondary | ICD-10-CM | POA: Insufficient documentation

## 2014-08-30 DIAGNOSIS — J45909 Unspecified asthma, uncomplicated: Secondary | ICD-10-CM | POA: Diagnosis present

## 2014-08-30 DIAGNOSIS — Z720 Tobacco use: Secondary | ICD-10-CM | POA: Insufficient documentation

## 2014-08-30 LAB — CBC WITH DIFFERENTIAL/PLATELET
Basophils Absolute: 0.1 10*3/uL (ref 0.0–0.1)
Basophils Relative: 0 % (ref 0–1)
Eosinophils Absolute: 0.1 10*3/uL (ref 0.0–0.7)
Eosinophils Relative: 1 % (ref 0–5)
HCT: 23.1 % — ABNORMAL LOW (ref 36.0–46.0)
Hemoglobin: 8.6 g/dL — ABNORMAL LOW (ref 12.0–15.0)
Lymphocytes Relative: 30 % (ref 12–46)
Lymphs Abs: 5.6 10*3/uL — ABNORMAL HIGH (ref 0.7–4.0)
MCH: 36.8 pg — ABNORMAL HIGH (ref 26.0–34.0)
MCHC: 37.2 g/dL — ABNORMAL HIGH (ref 30.0–36.0)
MCV: 98.7 fL (ref 78.0–100.0)
Monocytes Absolute: 1.5 10*3/uL — ABNORMAL HIGH (ref 0.1–1.0)
Monocytes Relative: 8 % (ref 3–12)
Neutro Abs: 11.4 10*3/uL — ABNORMAL HIGH (ref 1.7–7.7)
Neutrophils Relative %: 61 % (ref 43–77)
Platelets: 461 10*3/uL — ABNORMAL HIGH (ref 150–400)
RBC: 2.34 MIL/uL — ABNORMAL LOW (ref 3.87–5.11)
RDW: 18.3 % — ABNORMAL HIGH (ref 11.5–15.5)
WBC: 18.7 10*3/uL — ABNORMAL HIGH (ref 4.0–10.5)

## 2014-08-30 LAB — RAPID URINE DRUG SCREEN, HOSP PERFORMED
Amphetamines: NOT DETECTED
Barbiturates: NOT DETECTED
Benzodiazepines: NOT DETECTED
COCAINE: NOT DETECTED
OPIATES: NOT DETECTED
TETRAHYDROCANNABINOL: POSITIVE — AB

## 2014-08-30 LAB — COMPREHENSIVE METABOLIC PANEL
ALT: 15 U/L (ref 0–35)
AST: 27 U/L (ref 0–37)
Albumin: 3.7 g/dL (ref 3.5–5.2)
Alkaline Phosphatase: 63 U/L (ref 39–117)
Anion gap: 10 (ref 5–15)
BUN: 5 mg/dL — ABNORMAL LOW (ref 6–23)
CO2: 23 mEq/L (ref 19–32)
Calcium: 8.6 mg/dL (ref 8.4–10.5)
Chloride: 106 mEq/L (ref 96–112)
Creatinine, Ser: 0.63 mg/dL (ref 0.50–1.10)
GFR calc Af Amer: >90 mL/min (ref >90)
GFR calc non Af Amer: >90 mL/min (ref >90)
Glucose, Bld: 93 mg/dL (ref 70–99)
Potassium: 4.3 mEq/L (ref 3.7–5.3)
Sodium: 139 mEq/L (ref 137–147)
Total Bilirubin: 5.5 mg/dL — ABNORMAL HIGH (ref 0.3–1.2)
Total Protein: 6.7 g/dL (ref 6.0–8.3)

## 2014-08-30 LAB — RETICULOCYTES
RBC.: 2.34 MIL/uL — ABNORMAL LOW (ref 3.87–5.11)
Retic Count, Absolute: 449.3 10*3/uL — ABNORMAL HIGH (ref 19.0–186.0)
Retic Ct Pct: 19.2 % — ABNORMAL HIGH (ref 0.4–3.1)

## 2014-08-30 LAB — MAGNESIUM: Magnesium: 1.6 mg/dL (ref 1.5–2.5)

## 2014-08-30 MED ORDER — HYDROMORPHONE HCL 1 MG/ML IJ SOLN
1.0000 mg | Freq: Once | INTRAMUSCULAR | Status: AC
  Administered 2014-08-30: 1 mg via INTRAVENOUS
  Filled 2014-08-30: qty 1

## 2014-08-30 MED ORDER — ONDANSETRON HCL 4 MG PO TABS: 4.0000 mg | ORAL_TABLET | ORAL | Status: DC | PRN

## 2014-08-30 MED ORDER — SODIUM CHLORIDE 0.45 % IV SOLN
INTRAVENOUS | Status: DC
  Administered 2014-08-30 – 2014-08-31 (×3): via INTRAVENOUS
  Administered 2014-09-02: 75 mL/h via INTRAVENOUS

## 2014-08-30 MED ORDER — DIPHENHYDRAMINE HCL 50 MG/ML IJ SOLN: 12.5000 mg | INTRAMUSCULAR | Status: DC | PRN

## 2014-08-30 MED ORDER — HYDROMORPHONE 0.3 MG/ML IV SOLN
INTRAVENOUS | Status: DC
  Administered 2014-08-30: 2.9 mg via INTRAVENOUS
  Administered 2014-08-30: 17:00:00 via INTRAVENOUS
  Administered 2014-08-31: 1.8 mg via INTRAVENOUS
  Administered 2014-08-31: 1.2 mg via INTRAVENOUS
  Administered 2014-08-31: 04:00:00 via INTRAVENOUS
  Administered 2014-08-31: 2.82 mg via INTRAVENOUS
  Administered 2014-08-31: 1.5 mg via INTRAVENOUS
  Filled 2014-08-30 (×2): qty 25

## 2014-08-30 MED ORDER — HEPARIN SODIUM (PORCINE) 5000 UNIT/ML IJ SOLN
5000.0000 [IU] | Freq: Three times a day (TID) | INTRAMUSCULAR | Status: DC
  Administered 2014-08-30 – 2014-09-01 (×7): 5000 [IU] via SUBCUTANEOUS
  Filled 2014-08-30 (×11): qty 1

## 2014-08-30 MED ORDER — DIPHENHYDRAMINE HCL 50 MG/ML IJ SOLN
12.5000 mg | Freq: Four times a day (QID) | INTRAMUSCULAR | Status: DC | PRN
Start: 2014-08-30 — End: 2014-08-31

## 2014-08-30 MED ORDER — DIPHENHYDRAMINE HCL 12.5 MG/5ML PO ELIX
12.5000 mg | ORAL_SOLUTION | Freq: Four times a day (QID) | ORAL | Status: DC | PRN
  Filled 2014-08-30: qty 5

## 2014-08-30 MED ORDER — ONDANSETRON HCL 4 MG/2ML IJ SOLN: 4.0000 mg | INTRAMUSCULAR | Status: DC | PRN

## 2014-08-30 MED ORDER — SENNA 8.6 MG PO TABS
1.0000 | ORAL_TABLET | Freq: Two times a day (BID) | ORAL | Status: DC
  Administered 2014-08-30 – 2014-09-02 (×7): 8.6 mg via ORAL
  Filled 2014-08-30 (×9): qty 1

## 2014-08-30 MED ORDER — HYDROMORPHONE HCL 1 MG/ML IJ SOLN
1.0000 mg | Freq: Once | INTRAMUSCULAR | Status: AC
  Administered 2014-08-30: 1 mg via INTRAVENOUS

## 2014-08-30 MED ORDER — SODIUM CHLORIDE 0.9 % IJ SOLN: 9.0000 mL | INTRAMUSCULAR | Status: DC | PRN

## 2014-08-30 MED ORDER — NALOXONE HCL 0.4 MG/ML IJ SOLN
0.4000 mg | INTRAMUSCULAR | Status: DC | PRN
Start: 2014-08-30 — End: 2014-08-31

## 2014-08-30 MED ORDER — KETOROLAC TROMETHAMINE 30 MG/ML IJ SOLN
30.0000 mg | Freq: Four times a day (QID) | INTRAMUSCULAR | Status: DC
  Administered 2014-08-30 – 2014-09-02 (×14): 30 mg via INTRAVENOUS
  Filled 2014-08-30 (×19): qty 1

## 2014-08-30 MED ORDER — NICOTINE 14 MG/24HR TD PT24
14.0000 mg | MEDICATED_PATCH | Freq: Every day | TRANSDERMAL | Status: DC
  Administered 2014-08-30 – 2014-09-02 (×4): 14 mg via TRANSDERMAL
  Filled 2014-08-30 (×4): qty 1

## 2014-08-30 MED ORDER — ALBUTEROL SULFATE (2.5 MG/3ML) 0.083% IN NEBU: 2.5000 mg | INHALATION_SOLUTION | RESPIRATORY_TRACT | Status: DC | PRN

## 2014-08-30 MED ORDER — FOLIC ACID 1 MG PO TABS
1.0000 mg | ORAL_TABLET | Freq: Every day | ORAL | Status: DC
  Administered 2014-08-30 – 2014-09-02 (×3): 1 mg via ORAL
  Filled 2014-08-30 (×4): qty 1

## 2014-08-30 MED ORDER — HYDROMORPHONE HCL 1 MG/ML IJ SOLN
1.0000 mg | INTRAMUSCULAR | Status: DC | PRN
  Administered 2014-08-30 (×2): 1 mg via INTRAVENOUS
  Filled 2014-08-30 (×3): qty 1

## 2014-08-30 MED ORDER — DIPHENHYDRAMINE HCL 25 MG PO CAPS
25.0000 mg | ORAL_CAPSULE | ORAL | Status: DC | PRN
  Administered 2014-08-30: 25 mg via ORAL
  Filled 2014-08-30: qty 1

## 2014-08-30 MED ORDER — ONDANSETRON HCL 4 MG/2ML IJ SOLN: 4.0000 mg | Freq: Four times a day (QID) | INTRAMUSCULAR | Status: DC | PRN

## 2014-08-30 NOTE — Progress Notes (Signed)
Nurse Tech informed me that pt. Has her own narcotic pain medication in room. Myself and Delcine RN, went to discuss with pt. That she could not be taking her own medications and that we would need to confiscate them. Pt. Stated that she did not want to speak with [place] because we were not handling her pain and would like to speak with her MD. Pt. Refused to hand over medications. Paged on-call MD. MD stated she would place orders for PCA therapy. Charge Nurse Marc Morgans, RN) went to speak with pt. Along with security about needing to hand over her own medications. Pt. Did give over her 3 tablets of percocet. Lonnie, RN will follow up with pt. Care.

## 2014-08-30 NOTE — Discharge Instructions (Signed)
Sickle Cell Anemia, Adult °Sickle cell anemia is a condition in which red blood cells have an abnormal "sickle" shape. This abnormal shape shortens the cells' life span, which results in a lower than normal concentration of red blood cells in the blood. The sickle shape also causes the cells to clump together and block free blood flow through the blood vessels. As a result, the tissues and organs of the body do not receive enough oxygen. Sickle cell anemia causes organ damage and pain and increases the risk of infection. °CAUSES  °Sickle cell anemia is a genetic disorder. Those who receive two copies of the gene have the condition, and those who receive one copy have the trait. °RISK FACTORS °The sickle cell gene is most common in people whose families originated in [place]. Other areas of the globe where sickle cell trait occurs include the [place], [place] and [place], the [place], and the [place].  °SIGNS AND SYMPTOMS °· Pain, especially in the extremities, back, chest, or abdomen (common). The pain may start suddenly or may develop following an illness, especially if there is dehydration. Pain can also occur due to overexertion or exposure to extreme temperature changes. °· Frequent severe bacterial infections, especially certain types of pneumonia and meningitis. °· Pain and swelling in the hands and feet. °· Decreased activity.   °· Loss of appetite.   °· Change in behavior. °· Headaches. °· Seizures. °· Shortness of breath or difficulty breathing. °· Vision changes. °· Skin ulcers. °Those with the trait may not have symptoms or they may have mild symptoms.  °DIAGNOSIS  °Sickle cell anemia is diagnosed with blood tests that demonstrate the genetic trait. It is often diagnosed during the newborn period, due to mandatory testing nationwide. A variety of blood tests, X-rays, CT scans, MRI scans, ultrasounds, and lung function tests may also be done to monitor the condition. °TREATMENT  °Sickle  cell anemia may be treated with: °· Medicines. You may be given pain medicines, antibiotic medicines (to treat and prevent infections) or medicines to increase the production of certain types of hemoglobin. °· Fluids. °· Oxygen. °· Blood transfusions. °HOME CARE INSTRUCTIONS  °· Drink enough fluid to keep your urine clear or pale yellow. Increase your fluid intake in hot weather and during exercise. °· Do not smoke. Smoking lowers oxygen levels in the blood.   °· Only take over-the-counter or prescription medicines for pain, fever, or discomfort as directed by your health care provider. °· Take antibiotics as directed by your health care provider. Make sure you finish them it even if you start to feel better.   °· Take supplements as directed by your health care provider.   °· Consider wearing a medical alert bracelet. This tells anyone caring for you in an emergency of your condition.   °· When traveling, keep your medical information, health care provider's names, and the medicines you take with you at all times.   °· If you develop a fever, do not take medicines to reduce the fever right away. This could cover up a problem that is developing. Notify your health care provider. °· Keep all follow-up appointments with your health care provider. Sickle cell anemia requires regular medical care. °SEEK MEDICAL CARE IF: ° You have a fever. °SEEK IMMEDIATE MEDICAL CARE IF:  °· You feel dizzy or faint.   °· You have new abdominal pain, especially on the left side near the stomach area.   °· You develop a persistent, often uncomfortable and painful penile erection (priapism). If this is not treated immediately it   will lead to impotence.   °· You have numbness your arms or legs or you have a hard time moving them.   °· You have a hard time with speech.   °· You have a fever or persistent symptoms for more than 2-3 days.   °· You have a fever and your symptoms suddenly get worse.   °· You have signs or symptoms of infection.  These include:   °¨ Chills.   °¨ Abnormal tiredness (lethargy).   °¨ Irritability.   °¨ Poor eating.   °¨ Vomiting.   °· You develop pain that is not helped with medicine.   °· You develop shortness of breath. °· You have pain in your chest.   °· You are coughing up pus-like or bloody sputum.   °· You develop a stiff neck. °· Your feet or hands swell or have pain. °· Your abdomen appears bloated. °· You develop joint pain. °MAKE SURE YOU: °· Understand these instructions. °Document Released: 01/24/2006 Document Revised: 03/02/2014 Document Reviewed: 05/28/2013 °Exit[employer]® Patient Information ©2015 [employer]. This information is not intended to replace advice given to you by your health care provider. Make sure you discuss any questions you have with your health care provider. ° °

## 2014-08-30 NOTE — ED Notes (Signed)
See Paper charting.

## 2014-08-30 NOTE — Progress Notes (Signed)
Received report from ED nurse Hassan Rowan, RN); awaiting pt. Arrival to unit.

## 2014-08-30 NOTE — ED Notes (Signed)
Pt placed on 2L [place] for pain control due to sickle cell pain crisis.

## 2014-08-30 NOTE — ED Notes (Signed)
Called admitting md re pain control.

## 2014-08-30 NOTE — H&P (Signed)
[facility] Admission History and Physical Service Pager: [phone]  Patient name: [name] Medical record number: [id number] Date of birth: 1995/12/28 Age: 18 y.o. Gender: female  Primary Care Provider: Dimas Chyle, MD Consultants: None Code Status: Full code  Chief Complaint: chest and arm pain  Assessment and Plan: [name] is a 18 y.o. female presenting with sickle cell crisis pain . PMH is significant for sickle cell.  #Sickle Cell Disease Pain crisis: hemoglobin at baseline. Patient has adequate reticulocyte response. Patient with chest pain, but no other signs of acute chest with a negative chest x-ray. Patient breathing well on room air. Pain is typical per her. Dilaudid has helped. - admit to inpatient, Dr. Lindell Noe attending - dilaudid 1mg  q3hrs PRN for pain - Toradol 30mg  q6 hours - incentive spirometry - repeat CBC, Bmet in AM (today); daily CBC   #Sickle Cell Disease: s/p splenectomy for sequestration. Currently does not have a hematologist. She has been hospitalized once this year for crisis (a second time for a colitis). Patient not on hydroxyurea.   #Leukocytosis: isolated. Lymphocyte predominant. Possible viral illness. Patient currently asymptomatic  #Hx of substance abuse: history of positive cocaine and THC in past. Patient denies use. - UDS  #Tobacco abuse: states she smokes 1/2PPD - nicotine patch  FEN/GI: Regular diet, KVO Prophylaxis: heparin subq  Disposition: admit to inpatient, attending Dr. Lindell Noe  History of Present Illness: [name] is a 18 y.o. female presenting with chest and bilateral arm pain. Sharp throbbing pain started earlier today located in chest and throughout both of her arms. She has been using Oxycodone which has not helped. She went to the [facility] ED for pain management and was given dilaudid. CBC showed stable, at baseline, hemoglobin and chest x-ray was negative for acute  infiltrate. She was discharged. Symptoms continued and she came to the [facility] ED.   Review Of Systems: Per HPI with the following additions: None Otherwise 12 point review of systems was performed and was unremarkable.  Patient Active Problem List   Diagnosis Date Noted  . Hematochezia 02/27/2014  . UTI (lower urinary tract infection) 02/26/2014  . Vaginal discharge 02/24/2014  . Sickle cell pain crisis 11/23/2013  . Facial rash 03/25/2013  . Birth control counseling 08/14/2012  . Asthma, mild intermittent 12/14/2010  . 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 Asthma   . Sickle cell disease   . Amenorrhea    Past Surgical History: Past Surgical History  Procedure Laterality Date  . Splenectomy      Age 84 for sequestration  . Tonsillectomy      Age 15   Social History: History  Substance Use Topics  . Smoking status: Current Some Day Smoker -- 1.00 packs/day    Types: Cigarettes  . Smokeless tobacco: Never Used  . Alcohol Use: No     Comment: Denies 06/12/2014   Additional social history: None  Please also refer to relevant sections of EMR.  Family History: Family History  Problem Relation Age of Onset  . Hypertension Paternal Grandfather   . Sickle cell trait Father   . Cancer Mother     Died in 02-15-2009   Allergies and Medications: No Known Allergies No current facility-administered medications on file prior to encounter.   Current Outpatient Prescriptions on File Prior to Encounter  Medication Sig Dispense Refill  . albuterol (PROVENTIL HFA;VENTOLIN HFA) 108 (90 BASE)  MCG/ACT inhaler Inhale 2 puffs into the lungs every 4 (four) hours as needed for wheezing or shortness of breath. For shortness of breath    . ondansetron (ZOFRAN) 4 MG tablet Take 1 tablet (4 mg total) by mouth every 6 (six) hours. 12 tablet 0  . oxyCODONE-acetaminophen (PERCOCET/ROXICET) 5-325 MG per tablet Take 2 tablets by mouth  every 4 (four) hours as needed for severe pain. 15 tablet 0    Objective: LMP 07/15/2014 Exam: General: Fair appearing female in bed, appears in pain HEENT: EOMI, Moist mucous membranes Cardiovascular: Regular rate and rhythm, systolic murmur heard Respiratory: Clear to auscultation bilaterally, without wheezing Abdomen: Soft, non-tender, non-distended Musculoskeletal: no reproducible chest wall tenderness. Bilateral arm tenderness without edema or erythema Skin: Warm, dry Neuro: Alert and oriented x3  Labs and Imaging: CBC BMET   Recent Labs Lab 08/29/14 2241  WBC 15.7*  HGB 9.3*  HCT 25.5*  PLT 556*    Recent Labs Lab 08/29/14 2241  NA 139  K 4.2  CL 102  CO2 27  BUN 11  CREATININE 0.84  GLUCOSE 116*  CALCIUM 8.9     Dg Chest 2 View  08/29/2014   CLINICAL DATA:  Initial valuation for chest pain. History of asthma and sickle cell disease.  EXAM: CHEST  2 VIEW  COMPARISON:  Prior radiograph from 07/06/2014  FINDINGS: Cardiomegaly is stable from prior study.  The lungs are mildly hypoinflated with secondary bronchovascular crowding at the lung bases. No airspace consolidation, pleural effusion, or pulmonary edema is identified. There is no pneumothorax.  No acute osseous abnormality identified.  IMPRESSION: 1. Shallow lung inflation with mild bibasilar bronchovascular crowding. No other active cardiopulmonary disease. 2. Stable cardiomegaly.   Electronically Signed   By: Jeannine Boga M.D.   On: 08/29/2014 23:43    Cordelia Poche, MD 08/30/2014, 4:24 AM PGY-2, [facility] pager: [phone], text pages welcome

## 2014-08-30 NOTE — ED Notes (Signed)
All pt belongings including 11$ in cash transported with pt.

## 2014-08-30 NOTE — Progress Notes (Signed)
Pt. Arrived to unit at 0830am accompanied by ED nurse Hassan Rowan RN). ED nurse stated that pt. Did received pain medication and pt. Was lethargic upon assessment and was unable to stay awake to answer any questions. No skin issues noted. Obtained U/A for drug screen and tested positive for Tetrahydrocannabinol's. Educated pt. On use of call bell and how to contact me. Will continue to monitor pt. And assess pain management. Call light within reach. Bed alarm in place. No further needs noted at this time

## 2014-08-30 NOTE — ED Provider Notes (Signed)
CSN: [id number]     Arrival date & time 08/30/14  0243 History   First MD Initiated Contact with Patient 08/30/14 0453     No chief complaint on file.    (Consider location/radiation/quality/duration/timing/severity/associated sxs/prior Treatment) HPI Patient was seen earlier yesterday evening for sickle cell crisis. Complained of back pain and left arm pain and chest pain. Stated this was similar to her previous sickle cell crises pain. Had labs and chest x-ray performed. Was given several doses of pain medication with improvement of symptoms. Labs were roughly baseline. Patient was discharged home. Patient represents with continued pain to the left chest left arm. She has no shortness of breath or cough. Denies any fevers or chills. No lower extremity swelling or pain. States she is almost out of her pain medication at home. Past Medical History  Diagnosis Date  . Asthma   . Sickle cell disease   . Amenorrhea    Past Surgical History  Procedure Laterality Date  . 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 Tonsillectomy      Age 75   Family History  Problem Relation Age of Onset  . Hypertension Paternal Grandfather   . Sickle cell trait Father   . Cancer Mother     Died in Feb 02, 2009   History  Substance Use Topics  . Smoking status: Current Some Day Smoker -- 1.00 packs/day    Types: Cigarettes  . Smokeless tobacco: Never Used  . Alcohol Use: No     Comment: Denies 06/12/2014   OB History    No data available     Review of Systems  Constitutional: Negative for fever and chills.  Respiratory: Negative for shortness of breath.   Cardiovascular: Positive for chest pain.  Gastrointestinal: Negative for nausea, vomiting and abdominal pain.  Musculoskeletal: Positive for myalgias and back pain. Negative for neck pain and neck stiffness.  Skin: Negative for rash and wound.  Neurological: Negative for dizziness, weakness, light-headedness, numbness and headaches.  All other  systems reviewed and are negative.     Allergies  Review of patient's allergies indicates no known allergies.  Home Medications   Prior to Admission medications   Medication Sig Start Date End Date Taking? Authorizing Provider  oxyCODONE (OXY IR/ROXICODONE) 5 MG immediate release tablet Take 5 mg by mouth every 4 (four) hours as needed. pain   Yes Historical Provider, MD  oxyCODONE-acetaminophen (PERCOCET/ROXICET) 5-325 MG per tablet Take 2 tablets by mouth every 4 (four) hours as needed for severe pain. 06/17/14  Yes Ezequiel Essex, MD  albuterol (PROVENTIL HFA;VENTOLIN HFA) 108 (90 BASE) MCG/ACT inhaler Inhale 2 puffs into the lungs every 4 (four) hours as needed for wheezing or shortness of breath. For shortness of breath 08/01/13   Marin Olp, MD  ondansetron (ZOFRAN) 4 MG tablet Take 1 tablet (4 mg total) by mouth every 6 (six) hours. 06/17/14   Ezequiel Essex, MD   BP 135/99 mmHg  Pulse 96  SpO2 96%  LMP 07/15/2014 Physical Exam  Constitutional: She is oriented to person, place, and time. She appears well-developed and well-nourished.  Patient is crying.  HENT:  Head: Normocephalic and atraumatic.  Mouth/Throat: Oropharynx is clear and moist.  Eyes: EOM are normal. Pupils are equal, round, and reactive to light.  Neck: Normal range of motion. Neck supple.  Cardiovascular: Normal rate and regular rhythm.   Pulmonary/Chest: Effort normal and breath sounds normal. No respiratory distress. She has no wheezes. She has no  rales. She exhibits no tenderness.  Abdominal: Soft. Bowel sounds are normal. She exhibits no distension and no mass. There is no tenderness. There is no rebound and no guarding.  Musculoskeletal: Normal range of motion. She exhibits no edema or tenderness.  No tenderness with palpation.  Neurological: She is alert and oriented to person, place, and time.  Moves all extremities without deficit. Sensation is grossly intact.  Skin: Skin is warm and dry. No  rash noted. No erythema.  Psychiatric: She has a normal mood and affect. Her behavior is normal.  Nursing note and vitals reviewed.   ED Course  Procedures (including critical care time) Labs Review Labs Reviewed  URINE RAPID DRUG SCREEN (HOSP PERFORMED)    Imaging Review Dg Chest 2 View  08/29/2014   CLINICAL DATA:  Initial valuation for chest pain. History of asthma and sickle cell disease.  EXAM: CHEST  2 VIEW  COMPARISON:  Prior radiograph from 07/06/2014  FINDINGS: Cardiomegaly is stable from prior study.  The lungs are mildly hypoinflated with secondary bronchovascular crowding at the lung bases. No airspace consolidation, pleural effusion, or pulmonary edema is identified. There is no pneumothorax.  No acute osseous abnormality identified.  IMPRESSION: 1. Shallow lung inflation with mild bibasilar bronchovascular crowding. No other active cardiopulmonary disease. 2. Stable cardiomegaly.   Electronically Signed   By: Jeannine Boga M.D.   On: 08/29/2014 23:43     EKG Interpretation None      MDM   Final diagnoses:  Sickle cell crisis    Discussed with family medicine resident and will admit for pain control.    Julianne Rice, MD 08/30/14 [id number]

## 2014-08-31 MED ORDER — SODIUM CHLORIDE 0.9 % IJ SOLN: 9.0000 mL | INTRAMUSCULAR | Status: DC | PRN

## 2014-08-31 MED ORDER — DIPHENHYDRAMINE HCL 50 MG/ML IJ SOLN: 12.5000 mg | Freq: Four times a day (QID) | INTRAMUSCULAR | Status: DC | PRN

## 2014-08-31 MED ORDER — NALOXONE HCL 0.4 MG/ML IJ SOLN: 0.4000 mg | INTRAMUSCULAR | Status: DC | PRN

## 2014-08-31 MED ORDER — DIPHENHYDRAMINE HCL 12.5 MG/5ML PO ELIX
12.5000 mg | ORAL_SOLUTION | Freq: Four times a day (QID) | ORAL | Status: DC | PRN
  Filled 2014-08-31: qty 5

## 2014-08-31 MED ORDER — HYDROMORPHONE 0.3 MG/ML IV SOLN
INTRAVENOUS | Status: DC
  Administered 2014-08-31: 1.39 mg via INTRAVENOUS
  Administered 2014-08-31: 0.59 mg via INTRAVENOUS
  Administered 2014-08-31 – 2014-09-01 (×2): via INTRAVENOUS
  Administered 2014-09-01 (×2): 2.7 mg via INTRAVENOUS
  Filled 2014-08-31 (×2): qty 25

## 2014-08-31 MED ORDER — ONDANSETRON HCL 4 MG/2ML IJ SOLN: 4.0000 mg | Freq: Four times a day (QID) | INTRAMUSCULAR | Status: DC | PRN

## 2014-08-31 NOTE — Plan of Care (Signed)
Problem: Phase I Progression Outcomes Goal: Pain controlled with appropriate interventions Outcome: Progressing Goal: Pt. aware of pain management plan Outcome: Completed/Met Date Met:  08/31/14 Goal: Voiding Quantity Sufficient Outcome: Completed/Met Date Met:  08/31/14

## 2014-08-31 NOTE — Plan of Care (Signed)
Problem: Phase I Progression Outcomes Goal: Other Phase I Outcomes/Goals Outcome: Not Applicable Date Met:  89/78/47

## 2014-08-31 NOTE — Progress Notes (Signed)
Family Medicine Teaching Service Daily Progress Note Intern Pager: [phone]  Patient name: [name] Medical record number: [id number] Date of birth: 1996-08-25 Age: 18 y.o. Gender: female  Primary Care Provider: Dimas Chyle, MD Consultants: None Code Status: Full  Assessment and Plan: [name] is a 18 y.o. female presenting with sickle cell crisis pain . PMH is significant for sickle cell.  #Sickle Cell Disease Pain crisis: hemoglobin at baseline. Patient has adequate reticulocyte response. s/p splenectomy for sequestration. Currently does not have a hematologist. She has been hospitalized once this year for crisis (a second time for a colitis). Patient not on hydroxyurea.  - Hemoglobin 8.6 today down from 9.3 yesterday - Reticulocyte Count 19.2, down from 22.7 yesterday - Dilaudid PCA - Toradol 30mg  q6hr  - Incentive Spirometry - CXR showed now signs of acute chest  #Leukocytosis: isolated. Lymphocyte predominant. Possible viral illness. Patient currently asymptomatic - WBC 18.7 today up from 15.7 yesterday  #Hx of substance abuse: history of positive cocaine and THC in past. Patient denies use. - UDS- positive for THC  FEN/GI: Regular diet, 1/2NS at 66mL/hr Prophylaxis: heparin subq  Disposition: Admitted to [facility] Medicine Teaching Service. Discharge home pending improvement.  Subjective:  States pain has not improved and continues to be located in left chest and left arm. Denies any problems with respiration. States she has been using the PCA as often as available. No further complaints today.  Objective: Temp:  [98.3 F (36.8 C)-99.8 F (37.7 C)] 98.5 F (36.9 C) (11/02 0558) Pulse Rate:  [63-75] 75 (11/02 0558) Resp:  [12-20] 18 (11/02 0810) BP: (127-128)/(75-91) 128/91 mmHg (11/02 0558) SpO2:  [96 %-100 %] 96 % (11/02 0810) Weight:  [147 lb 4.8 oz (66.815 kg)] 147 lb 4.8 oz (66.815 kg) (11/01 0840) Physical Exam: General: 18yo female  resting comfortably in no apparent distress Cardiovascular: S1 and S2 noted. No murmurs, rubs, or gallops. Respiratory: No increased work of breathing noted. Clear to auscultation bilaterally. No wheezing noted. Abdomen: Soft, nontender, nondistended, no masses palpated Extremities: No edema noted  Laboratory:  Recent Labs Lab 08/29/14 2241 08/30/14 1205  WBC 15.7* 18.7*  HGB 9.3* 8.6*  HCT 25.5* 23.1*  PLT 556* 461*    Recent Labs Lab 08/29/14 2241 08/30/14 1205  NA 139 139  K 4.2 4.3  CL 102 106  CO2 27 23  BUN 11 5*  CREATININE 0.84 0.63  CALCIUM 8.9 8.6  PROT 7.2 6.7  BILITOT 5.4* 5.5*  ALKPHOS 64 63  ALT 17 15  AST 40* 27  GLUCOSE 116* 93   Imaging/Diagnostic Tests: CXR- shallow lung inflation with mild bibasilar bronchovascular crowding. No other active cardiopulmonary disease. Stable cardiomegaly.  [place], [place] 08/31/2014, 8:29 AM PGY-1, [place] Intern pager: [phone], text pages welcome

## 2014-09-01 ENCOUNTER — Inpatient Hospital Stay (HOSPITAL_COMMUNITY): Payer: Medicaid Other

## 2014-09-01 LAB — CBC
HEMATOCRIT: 22.6 % — AB (ref 36.0–46.0)
Hemoglobin: 8.1 g/dL — ABNORMAL LOW (ref 12.0–15.0)
MCH: 34.9 pg — AB (ref 26.0–34.0)
MCHC: 35.8 g/dL (ref 30.0–36.0)
MCV: 97.4 fL (ref 78.0–100.0)
Platelets: 444 10*3/uL — ABNORMAL HIGH (ref 150–400)
RBC: 2.32 MIL/uL — AB (ref 3.87–5.11)
RDW: 16.8 % — ABNORMAL HIGH (ref 11.5–15.5)
WBC: 11.5 10*3/uL — ABNORMAL HIGH (ref 4.0–10.5)

## 2014-09-01 MED ORDER — OXYCODONE HCL ER 10 MG PO T12A
10.0000 mg | EXTENDED_RELEASE_TABLET | Freq: Two times a day (BID) | ORAL | Status: DC
  Administered 2014-09-01 (×2): 10 mg via ORAL
  Filled 2014-09-01 (×2): qty 1

## 2014-09-01 MED ORDER — HYDROMORPHONE HCL 1 MG/ML IJ SOLN
1.0000 mg | INTRAMUSCULAR | Status: DC | PRN
  Administered 2014-09-01 – 2014-09-02 (×7): 1 mg via INTRAVENOUS
  Filled 2014-09-01 (×7): qty 1

## 2014-09-01 MED ORDER — SODIUM CHLORIDE 0.9 % IV BOLUS (SEPSIS)
500.0000 mL | Freq: Once | INTRAVENOUS | Status: AC
  Administered 2014-09-01: 500 mL via INTRAVENOUS

## 2014-09-01 MED ORDER — HYDROMORPHONE HCL 1 MG/ML IJ SOLN
0.5000 mg | INTRAMUSCULAR | Status: DC | PRN
  Administered 2014-09-01: 0.5 mg via INTRAVENOUS
  Filled 2014-09-01: qty 1

## 2014-09-01 NOTE — Progress Notes (Signed)
Pt BP 95/43. Schorr, Np notified. Will continue to monitor pt.

## 2014-09-01 NOTE — Progress Notes (Signed)
Family Medicine Teaching Service Daily Progress Note Intern Pager: [phone]  Patient name: [name] Medical record number: [id number] Date of birth: 06/25/1996 Age: 18 y.o. Gender: female  Primary Care Provider: Dimas Chyle, MD Consultants: None Code Status: Full  Assessment and Plan: [name] is a 18 y.o. female presenting with sickle cell crisis pain . PMH is significant for sickle cell.  #Sickle Cell Disease Pain crisis: hemoglobin at baseline. Patient has adequate reticulocyte response. s/p splenectomy for sequestration. Currently does not have a hematologist. She has been hospitalized once this year for crisis (a second time for a colitis). Patient not on hydroxyurea.  - Hemoglobin 8.6 today down from 9.3 yesterday - Reticulocyte Count 19.2, down from 22.7 yesterday - Dilaudid reduced dose PCA and Toradol 30mg  q6hr   - Switching off of PCA today  - Incentive Spirometry - CXR showed now signs of acute chest  #Leukocytosis: isolated. Lymphocyte predominant. Possible viral illness. Patient currently asymptomatic - WBC 18.7  #Hx of substance abuse: history of positive cocaine and THC in past. Patient denies use. - UDS- positive for THC  FEN/GI: Regular diet, 1/2NS at 70mL/hr Prophylaxis: heparin subq  Disposition: Admitted to [facility] Medicine Teaching Service. Discharge home pending improvement.  Subjective:  Hypotensive at 95/43 this morning and bolus of 500cc given. States pain is mildly improved since yesterday, but continues to be located in chest and entire left arm.  Agrees to switch off of PCA today. Has been using incentive spirometry.  No further complaints today.  Objective: Temp:  [97.9 F (36.6 C)-98.7 F (37.1 C)] 98 F (36.7 C) (11/03 0528) Pulse Rate:  [61-78] 61 (11/03 0528) Resp:  [11-19] 16 (11/03 0528) BP: (95-113)/(43-72) 95/43 mmHg (11/03 0528) SpO2:  [96 %-100 %] 100 % (11/03 0528) Physical Exam: General: 18yo female  resting comfortably in no apparent distress Cardiovascular: S1 and S2 noted. No murmurs, rubs, or gallops. Respiratory: No increased work of breathing noted. Clear to auscultation bilaterally. No wheezing noted. Abdomen: Soft, nontender, nondistended, no masses palpated Extremities: No edema noted  Laboratory:  Recent Labs Lab 08/29/14 2241 08/30/14 1205  WBC 15.7* 18.7*  HGB 9.3* 8.6*  HCT 25.5* 23.1*  PLT 556* 461*    Recent Labs Lab 08/29/14 2241 08/30/14 1205  NA 139 139  K 4.2 4.3  CL 102 106  CO2 27 23  BUN 11 5*  CREATININE 0.84 0.63  CALCIUM 8.9 8.6  PROT 7.2 6.7  BILITOT 5.4* 5.5*  ALKPHOS 64 63  ALT 17 15  AST 40* 27  GLUCOSE 116* 93   Imaging/Diagnostic Tests: CXR- shallow lung inflation with mild bibasilar bronchovascular crowding. No other active cardiopulmonary disease. Stable cardiomegaly.  [place], [place] 09/01/2014, 8:38 AM PGY-1, [place] Intern pager: [phone], text pages welcome

## 2014-09-01 NOTE — Progress Notes (Signed)
Patient complained of chest pain mid sternal, not radiating. EKG done and showed SR. Notified Family Medicine services.

## 2014-09-01 NOTE — Plan of Care (Signed)
Problem: Phase II Progression Outcomes Goal: Tolerating diet Outcome: Completed/Met Date Met:  09/01/14     

## 2014-09-01 NOTE — Progress Notes (Signed)
500 cc bolus given to treat BP of 95/43. Will continue to monitor pt.

## 2014-09-02 LAB — CBC
HEMATOCRIT: 20.7 % — AB (ref 36.0–46.0)
Hemoglobin: 7.6 g/dL — ABNORMAL LOW (ref 12.0–15.0)
MCH: 35 pg — AB (ref 26.0–34.0)
MCHC: 36.7 g/dL — ABNORMAL HIGH (ref 30.0–36.0)
MCV: 95.4 fL (ref 78.0–100.0)
PLATELETS: 435 10*3/uL — AB (ref 150–400)
RBC: 2.17 MIL/uL — ABNORMAL LOW (ref 3.87–5.11)
RDW: 17.2 % — ABNORMAL HIGH (ref 11.5–15.5)
WBC: 10.3 10*3/uL (ref 4.0–10.5)

## 2014-09-02 MED ORDER — MUPIROCIN CALCIUM 2 % EX CREA
TOPICAL_CREAM | Freq: Three times a day (TID) | CUTANEOUS | Status: DC
  Administered 2014-09-02: 1 via TOPICAL
  Filled 2014-09-02 (×2): qty 15

## 2014-09-02 MED ORDER — OXYCODONE HCL ER 10 MG PO T12A
20.0000 mg | EXTENDED_RELEASE_TABLET | Freq: Two times a day (BID) | ORAL | Status: DC
  Administered 2014-09-02: 20 mg via ORAL
  Filled 2014-09-02: qty 2

## 2014-09-02 NOTE — Progress Notes (Signed)
Called to patient's room. Pt stated that she wanted to go home. I explained to patient that she was not ready to go home before the doctors deemed her ready for discharge. She stated that she wanted to go home anyway. I advised her of the potential consequences and risks of leaving against medical advice. Patient was adamant about wanting to leave. Patient signed AMA form. Dr. Gerlean Ren, resident, was notified. Pt IV was removed. Pt had her own transportation.

## 2014-09-02 NOTE — Progress Notes (Signed)
Family Medicine Teaching Service Daily Progress Note Intern Pager: [phone]  Patient name: [name] Medical record number: [id number] Date of birth: May 18, 1996 Age: 18 y.o. Gender: female  Primary Care Provider: Dimas Chyle, MD Consultants: None Code Status: Full  Assessment and Plan: [name] is a 18 y.o. female presenting with sickle cell crisis pain . PMH is significant for sickle cell.  #Sickle Cell Disease Pain crisis: hemoglobin at baseline. Patient has adequate reticulocyte response. s/p splenectomy for sequestration. Currently does not have a hematologist. She has been hospitalized once this year for crisis (a second time for a colitis). Patient not on hydroxyurea.  - Hemoglobin 7.6 today down from 8.1 yesterday and 9.3 at admission - Reticulocyte Count 19.2, down from 22.7 yesterday - Switched off of PCA on 11/3  - Currently on Oxycodone 10mg  BID and Dilaudid 1mg  q2hr PRN   -Received doses of dilaudid q2-3 hours  - Pain is still a 10/10  - Will increase Oxycodone dose to 20mg  BID  - Incentive Spirometry - CXR showed now signs of acute chest  #Impetigo - Presented on 11/3 - Mupirocin cream TID  #Constipation - Currently receiving Senna 8.6mg  BID - Will use OMM today to treat parasympathetics, sympathetics, and release fascia and musculature in abdomen  #Leukocytosis: Resolved. Lymphocyte predominant. Possible viral illness. Patient currently asymptomatic - WBC 10.3 today  #Hx of substance abuse: history of positive cocaine and THC in past. Patient denies use. - UDS- positive for THC  FEN/GI: Regular diet, 1/2NS at 38mL/hr Prophylaxis: heparin subq  Disposition: Admitted to [facility] Medicine Teaching Service. Discharge home pending improvement.  Subjective:  Pain is similar to yesterday, located in chest and left arm.  Pain was 10/10 yesterday since switching off of PCA and continues to be 10/10 this morning.  She has been using her  incentive spirometry.  Has not had a bowel movement in three days.  Also complains of itchy rash on nose that has been present since yesterday. No further complaints today.  Objective: Temp:  [98 F (36.7 C)-98.5 F (36.9 C)] 98.1 F (36.7 C) (11/04 2683) Pulse Rate:  [63-89] 74 (11/04 0614) Resp:  [16-18] 18 (11/04 0614) BP: (98-119)/(48-60) 106/52 mmHg (11/04 0614) SpO2:  [93 %-100 %] 96 % (11/04 4196) Physical Exam: General: 18yo female resting comfortably in no apparent distress Cardiovascular: S1 and S2 noted. No murmurs, rubs, or gallops. Respiratory: No increased work of breathing noted. Clear to auscultation bilaterally. No wheezing noted. Abdomen: Soft, nontender, nondistended, no masses palpated Extremities: No edema noted  Laboratory:  Recent Labs Lab 08/30/14 1205 09/01/14 0925 09/02/14 0708  WBC 18.7* 11.5* 10.3  HGB 8.6* 8.1* 7.6*  HCT 23.1* 22.6* 20.7*  PLT 461* 444* 435*    Recent Labs Lab 08/29/14 2241 08/30/14 1205  NA 139 139  K 4.2 4.3  CL 102 106  CO2 27 23  BUN 11 5*  CREATININE 0.84 0.63  CALCIUM 8.9 8.6  PROT 7.2 6.7  BILITOT 5.4* 5.5*  ALKPHOS 64 63  ALT 17 15  AST 40* 27  GLUCOSE 116* 93   Imaging/Diagnostic Tests: CXR- shallow lung inflation with mild bibasilar bronchovascular crowding. No other active cardiopulmonary disease. Stable cardiomegaly.  [place], [place] 09/02/2014, 9:07 AM PGY-1, [place] Intern pager: [phone], text pages welcome

## 2014-09-03 NOTE — Discharge Summary (Signed)
[facility] Discharge Summary  Patient name: [name] Medical record number: [id number] Date of birth: 09-25-1996 Age: 18 y.o. Gender: female Date of Admission: 08/30/2014  Date of Discharge: Left AMA on 09/02/14 Admitting Physician: Willeen Niece, MD  Primary Care Provider: Dimas Chyle, MD Consultants: None  Indication for Hospitalization: Sickle Cell Pain Crisis  Discharge Diagnoses/Problem List:  Sickle Cell Pain Crisis Impetigo Constipation Leukocytosis  Disposition: Left [facility]  Brief Hospital Course:  [name] is a 18yo female who presented on 08/30/14 with pain in her chest and left arm and diagnosed with sickle cell pain crisis. Hemoglobin was at baseline at admission at 9.3, but trended down throughout hospitalization and was 7.6 on last day of hospitalization. Reticulocyte count was 22.7 on admission, but trended down to 19.2. Chest xrays were negative.  Incentive Spirometry was used throughout hospitalization. Leukocytosis was present at 18.7 and suspected to be increased due to viral infection, but this trended down to 10.3. Pain was initially controlled with PCA from 11/1 to 11/3 and she was then transitioned to PO pain medications. [name] complained of constipation throughout hospitalization and was not relieved by Senna, so soap suds enema was given on 11/4/5. On 11/4 she noted a new rash on the right side of her nose concerning for Impetigo. She was given Mupirocin to use TID. She left AMA on 09/02/14 for unknown reasons despite discussing the potential consequences of her leaving and her decreasing hemoglobin, which is concerning.  Issues for Follow Up:  - Hemoglobin 7.6 when left AMA. Recheck at next presentation to office/ED. - Follow up pain crisis - Concern that pain crisis may progress to Acute Chest without adequate treatment - Impetigo  Significant Procedures: none  Significant Labs and Imaging:   Recent  Labs Lab 08/30/14 1205 09/01/14 0925 09/02/14 0708  WBC 18.7* 11.5* 10.3  HGB 8.6* 8.1* 7.6*  HCT 23.1* 22.6* 20.7*  PLT 461* 444* 435*    Recent Labs Lab 08/29/14 2241 08/30/14 1205  NA 139 139  K 4.2 4.3  CL 102 106  CO2 27 23  GLUCOSE 116* 93  BUN 11 5*  CREATININE 0.84 0.63  CALCIUM 8.9 8.6  MG  --  1.6  ALKPHOS 64 63  AST 40* 27  ALT 17 15  ALBUMIN 3.9 3.7   Dg Chest 2 View  09/01/2014   CLINICAL DATA:  Chest pain for 2 days  EXAM: CHEST  2 VIEW  COMPARISON:  08/29/2014  FINDINGS: The heart size and mediastinal contours are within normal limits. Both lungs are clear. The visualized skeletal structures are unremarkable.  IMPRESSION: No active cardiopulmonary disease.   Electronically Signed   By: Inez Catalina M.D.   On: 09/01/2014 16:49   Dg Chest 2 View  08/29/2014   CLINICAL DATA:  Initial valuation for chest pain. History of asthma and sickle cell disease.  EXAM: CHEST  2 VIEW  COMPARISON:  Prior radiograph from 07/06/2014  FINDINGS: Cardiomegaly is stable from prior study.  The lungs are mildly hypoinflated with secondary bronchovascular crowding at the lung bases. No airspace consolidation, pleural effusion, or pulmonary edema is identified. There is no pneumothorax.  No acute osseous abnormality identified.  IMPRESSION: 1. Shallow lung inflation with mild bibasilar bronchovascular crowding. No other active cardiopulmonary disease. 2. Stable cardiomegaly.   Electronically Signed   By: Jeannine Boga M.D.   On: 08/29/2014 23:43   Results/Tests Pending at Time of Discharge: None  Discharge Medications:  Medication List    ASK your doctor about these medications        albuterol 108 (90 BASE) MCG/ACT inhaler  Commonly known as:  PROVENTIL HFA;VENTOLIN HFA  Inhale 2 puffs into the lungs every 4 (four) hours as needed for wheezing or shortness of breath. For shortness of breath     ondansetron 4 MG tablet  Commonly known as:  ZOFRAN  Take 1 tablet (4 mg  total) by mouth every 6 (six) hours.     oxyCODONE 5 MG immediate release tablet  Commonly known as:  Oxy IR/ROXICODONE  Take 5 mg by mouth every 4 (four) hours as needed. pain     oxyCODONE-acetaminophen 5-325 MG per tablet  Commonly known as:  PERCOCET/ROXICET  Take 2 tablets by mouth every 4 (four) hours as needed for severe pain.       Discharge Instructions: Please refer to Patient Instructions section of EMR for full details.  Patient was counseled important signs and symptoms that should prompt return to medical care, changes in medications, dietary instructions, activity restrictions, and follow up appointments.   Follow-Up Appointments:   Lorna Few, DO 09/03/2014, 12:35 PM PGY-1, [employer]

## 2014-09-03 NOTE — Care Management Note (Signed)
    Page 1 of 1   09/03/2014     8:11:42 AM CARE MANAGEMENT NOTE 09/03/2014  Patient:  [name], [name]   Account Number:  [id number]  Date Initiated:  09/03/2014  Documentation initiated by:  Tomi Bamberger  Subjective/Objective Assessment:   dx sickle cell  admit- lives alone.     Action/Plan:   Anticipated DC Date:  09/02/2014   Anticipated DC Plan:  [place]  CM consult      Choice offered to / List presented to:             Status of service:  Completed, signed off Medicare Important Message given?  NO (If response is "NO", the following Medicare IM given date fields will be blank) Date Medicare IM given:   Medicare IM given by:   Date Additional Medicare IM given:   Additional Medicare IM given by:    Discharge Disposition:  HOME/SELF CARE  Per UR Regulation:  Reviewed for med. necessity/level of care/duration of stay  If discussed at [place] of Stay Meetings, dates discussed:    Comments:

## 2014-12-01 ENCOUNTER — Encounter (HOSPITAL_COMMUNITY): Payer: Self-pay

## 2014-12-01 ENCOUNTER — Emergency Department (HOSPITAL_COMMUNITY)
Admission: EM | Admit: 2014-12-01 | Discharge: 2014-12-01 | Disposition: A | Discharge status: Home / Self Care | Payer: Medicaid Other | Attending: Emergency Medicine | Admitting: Emergency Medicine

## 2014-12-01 DIAGNOSIS — Z862 Personal history of diseases of the blood and blood-forming organs and certain disorders involving the immune mechanism: Secondary | ICD-10-CM | POA: Insufficient documentation

## 2014-12-01 DIAGNOSIS — Z87448 Personal history of other diseases of urinary system: Secondary | ICD-10-CM | POA: Insufficient documentation

## 2014-12-01 DIAGNOSIS — Z720 Tobacco use: Secondary | ICD-10-CM | POA: Insufficient documentation

## 2014-12-01 DIAGNOSIS — R110 Nausea: Secondary | ICD-10-CM

## 2014-12-01 DIAGNOSIS — L259 Unspecified contact dermatitis, unspecified cause: Secondary | ICD-10-CM

## 2014-12-01 DIAGNOSIS — Z3202 Encounter for pregnancy test, result negative: Secondary | ICD-10-CM | POA: Insufficient documentation

## 2014-12-01 DIAGNOSIS — R21 Rash and other nonspecific skin eruption: Secondary | ICD-10-CM | POA: Insufficient documentation

## 2014-12-01 DIAGNOSIS — Z202 Contact with and (suspected) exposure to infections with a predominantly sexual mode of transmission: Secondary | ICD-10-CM | POA: Diagnosis not present

## 2014-12-01 DIAGNOSIS — J45909 Unspecified asthma, uncomplicated: Secondary | ICD-10-CM | POA: Diagnosis not present

## 2014-12-01 LAB — COMPREHENSIVE METABOLIC PANEL
ALBUMIN: 3.8 g/dL (ref 3.5–5.2)
ALT: 15 U/L (ref 0–35)
AST: 25 U/L (ref 0–37)
Alkaline Phosphatase: 57 U/L (ref 39–117)
Anion gap: 7 (ref 5–15)
BILIRUBIN TOTAL: 6.3 mg/dL — AB (ref 0.3–1.2)
BUN: 6 mg/dL (ref 6–23)
CO2: 26 mmol/L (ref 19–32)
Calcium: 8.8 mg/dL (ref 8.4–10.5)
Chloride: 105 mmol/L (ref 96–112)
Creatinine, Ser: 0.88 mg/dL (ref 0.50–1.10)
GFR calc Af Amer: >90 mL/min (ref >90)
GLUCOSE: 134 mg/dL — AB (ref 70–99)
Potassium: 4 mmol/L (ref 3.5–5.1)
Sodium: 138 mmol/L (ref 135–145)
Total Protein: 6.5 g/dL (ref 6.0–8.3)

## 2014-12-01 LAB — URINALYSIS, ROUTINE W REFLEX MICROSCOPIC
Bilirubin Urine: NEGATIVE
Glucose, UA: NEGATIVE mg/dL
Hgb urine dipstick: NEGATIVE
Ketones, ur: NEGATIVE mg/dL
Nitrite: NEGATIVE
PH: 6 (ref 5.0–8.0)
Protein, ur: NEGATIVE mg/dL
Specific Gravity, Urine: 1.011 (ref 1.005–1.030)
UROBILINOGEN UA: 1 mg/dL (ref 0.0–1.0)

## 2014-12-01 LAB — CBC WITH DIFFERENTIAL/PLATELET
BASOS PCT: 1 % (ref 0–1)
Basophils Absolute: 0.1 10*3/uL (ref 0.0–0.1)
Eosinophils Absolute: 0.1 10*3/uL (ref 0.0–0.7)
Eosinophils Relative: 1 % (ref 0–5)
HCT: 25 % — ABNORMAL LOW (ref 36.0–46.0)
Hemoglobin: 9.1 g/dL — ABNORMAL LOW (ref 12.0–15.0)
LYMPHS ABS: 4.4 10*3/uL — AB (ref 0.7–4.0)
LYMPHS PCT: 43 % (ref 12–46)
MCH: 36.3 pg — ABNORMAL HIGH (ref 26.0–34.0)
MCHC: 36.4 g/dL — ABNORMAL HIGH (ref 30.0–36.0)
MCV: 99.6 fL (ref 78.0–100.0)
MONO ABS: 1.2 10*3/uL — AB (ref 0.1–1.0)
MONOS PCT: 11 % (ref 3–12)
NEUTROS PCT: 44 % (ref 43–77)
Neutro Abs: 4.5 10*3/uL (ref 1.7–7.7)
PLATELETS: 553 10*3/uL — AB (ref 150–400)
RBC: 2.51 MIL/uL — ABNORMAL LOW (ref 3.87–5.11)
RDW: 18.2 % — ABNORMAL HIGH (ref 11.5–15.5)
WBC: 10.2 10*3/uL (ref 4.0–10.5)

## 2014-12-01 LAB — PREGNANCY, URINE: Preg Test, Ur: NEGATIVE

## 2014-12-01 LAB — URINE MICROSCOPIC-ADD ON

## 2014-12-01 MED ORDER — LIDOCAINE HCL (PF) 1 % IJ SOLN
INTRAMUSCULAR | Status: AC
  Filled 2014-12-01: qty 5

## 2014-12-01 MED ORDER — CEFTRIAXONE SODIUM 250 MG IJ SOLR
250.0000 mg | Freq: Once | INTRAMUSCULAR | Status: AC
  Administered 2014-12-01: 250 mg via INTRAMUSCULAR
  Filled 2014-12-01: qty 250

## 2014-12-01 MED ORDER — HYDROCORTISONE 1 % EX LOTN: 1.0000 "application " | TOPICAL_LOTION | Freq: Two times a day (BID) | CUTANEOUS | Status: DC

## 2014-12-01 MED ORDER — ONDANSETRON 4 MG PO TBDP: 4.0000 mg | ORAL_TABLET | Freq: Three times a day (TID) | ORAL | Status: DC | PRN

## 2014-12-01 MED ORDER — LIDOCAINE HCL (PF) 1 % IJ SOLN
5.0000 mL | Freq: Once | INTRAMUSCULAR | Status: AC
  Administered 2014-12-01: 5 mL

## 2014-12-01 MED ORDER — AZITHROMYCIN 250 MG PO TABS
1000.0000 mg | ORAL_TABLET | Freq: Once | ORAL | Status: AC
  Administered 2014-12-01: 1000 mg via ORAL
  Filled 2014-12-01: qty 4

## 2014-12-01 NOTE — Discharge Instructions (Signed)
Use cortisone cream for the rash on the back of your neck. Take zofran as needed for nausea. Refer to attached documents for more information.

## 2014-12-01 NOTE — ED Notes (Signed)
Pt found out her boyfriend has been being treated for STD and she wanted to get checked out. Denies any vaginal discharge or odors, does have abd pain. And wants to be checked for being pregnant. Also noticed a rash on the back of her neck yesterday .

## 2014-12-01 NOTE — ED Notes (Signed)
Resident at the bedside

## 2014-12-01 NOTE — ED Provider Notes (Signed)
CSN: [id number]     Arrival date & time 12/01/14  0714 History   First MD Initiated Contact with Patient 12/01/14 [phone]     Chief Complaint  Patient presents with  . Exposure to STD  . Rash  . Abdominal Pain     (Consider location/radiation/quality/duration/timing/severity/associated sxs/prior Treatment) HPI Comments: Patient is a 19 year old female who presents to the ED today with a rash and requesting an STI check. She states the rash appeared 2d ago. She complains only of rash on the back of her neck and denies seeing it anywhere else on her body. The rash has worsened, but does not appear to be spreading. She does endorse itching, but not discharge or weeping from the rash. She has not attempted any remedy before being evaluated today. She endorses that the onset of the rash coincides with her new hair weave which contains artificial coloring. She denies any facial, tongue, or lip swelling. She also denies difficulty swallowing or SOB. She denies any facial, tongue, or lip swelling. She also denies difficulty swallowing or SOB. She also requests an STI and pregnancy check while at the ED. She states she recently found out that her partner has been receiving Tx for GC/Chlamydia x1wk. She admits to having recent unprotected exposures. She complains of nausea, but has no vomited and has no abdominal pain. She denies any dysuria, increased frequency, or change in color/concentration of urine. She denies vaginal bleeding/spotting, discharge, itching, burning, or pain.    Past Medical History  Diagnosis Date  . Asthma   . Sickle cell disease   . Amenorrhea    Past Surgical History  Procedure Laterality Date  . 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 Tonsillectomy      Age 37   Family History  Problem Relation Age of Onset  . Hypertension Paternal Grandfather   . Sickle cell trait Father   . Cancer Mother     Died in February 03, 2009   History  Substance Use Topics  . Smoking status:  Current Some Day Smoker -- 0.50 packs/day    Types: Cigarettes  . Smokeless tobacco: Never Used  . Alcohol Use: No     Comment: Denies 06/12/2014   OB History    No data available     Review of Systems  Constitutional: Negative for fever, chills and fatigue.  HENT: Negative for trouble swallowing.   Eyes: Negative for visual disturbance.  Respiratory: Negative for shortness of breath.   Cardiovascular: Negative for chest pain and palpitations.  Gastrointestinal: Positive for nausea. Negative for vomiting, abdominal pain and diarrhea.  Genitourinary: Negative for dysuria and difficulty urinating.  Musculoskeletal: Negative for arthralgias and neck pain.  Skin: Positive for rash. Negative for color change.  Neurological: Negative for dizziness and weakness.  Psychiatric/Behavioral: Negative for dysphoric mood.      Allergies  Review of patient's allergies indicates no known allergies.  Home Medications   Prior to Admission medications   Medication Sig Start Date End Date Taking? Authorizing Provider  albuterol (PROVENTIL HFA;VENTOLIN HFA) 108 (90 BASE) MCG/ACT inhaler Inhale 2 puffs into the lungs every 4 (four) hours as needed for wheezing or shortness of breath. For shortness of breath 08/01/13  Yes Marin Olp, MD  ibuprofen (ADVIL,MOTRIN) 200 MG tablet Take 200 mg by mouth every 6 (six) hours as needed for moderate pain.   Yes Historical Provider, MD  ondansetron (ZOFRAN) 4 MG tablet Take 1 tablet (4 mg total)  by mouth every 6 (six) hours. Patient not taking: Reported on 12/01/2014 06/17/14   Ezequiel Essex, MD  oxyCODONE (OXY IR/ROXICODONE) 5 MG immediate release tablet Take 5 mg by mouth every 4 (four) hours as needed. pain    Historical Provider, MD  oxyCODONE-acetaminophen (PERCOCET/ROXICET) 5-325 MG per tablet Take 2 tablets by mouth every 4 (four) hours as needed for severe pain. Patient not taking: Reported on 12/01/2014 06/17/14   Ezequiel Essex, MD   BP 99/58 mmHg   Pulse 72  Temp(Src) 98.2 F (36.8 C) (Oral)  Resp 18  Ht 5\' 4"  (1.626 m)  Wt 135 lb (61.236 kg)  BMI 23.16 kg/m2  SpO2 96%  LMP 11/02/2014 Physical Exam  ED Course  Procedures (including critical care time) Labs Review Labs Reviewed  URINALYSIS, ROUTINE W REFLEX MICROSCOPIC - Abnormal; Notable for the following:    Leukocytes, UA TRACE (*)    All other components within normal limits  CBC WITH DIFFERENTIAL/PLATELET - Abnormal; Notable for the following:    RBC 2.51 (*)    Hemoglobin 9.1 (*)    HCT 25.0 (*)    MCH 36.3 (*)    MCHC 36.4 (*)    RDW 18.2 (*)    Platelets 553 (*)    Lymphs Abs 4.4 (*)    Monocytes Absolute 1.2 (*)    All other components within normal limits  COMPREHENSIVE METABOLIC PANEL - Abnormal; Notable for the following:    Glucose, Bld 134 (*)    Total Bilirubin 6.3 (*)    All other components within normal limits  URINE MICROSCOPIC-ADD ON - Abnormal; Notable for the following:    Squamous Epithelial / LPF MANY (*)    Bacteria, UA FEW (*)    All other components within normal limits  PREGNANCY, URINE    Imaging Review No results found.   EKG Interpretation None      MDM   Final diagnoses:  Contact dermatitis  Nausea  STD exposure    8:00 AM Labs and urinalysis pending. Vitals stable and patient afebrile. Patient likely has contact dermatitis from her new weave. Patient will be treated prophylactically for GC/chlamydia.     Alvina Chou, PA-C 12/01/14 [place], MD 12/01/14 [phone]

## 2015-02-24 ENCOUNTER — Inpatient Hospital Stay (HOSPITAL_COMMUNITY)
Admission: EM | Admit: 2015-02-24 | Discharge: 2015-02-26 | DRG: 812 | Discharge status: Against Medical Advice | Payer: Medicare Other | Attending: Internal Medicine | Admitting: Internal Medicine

## 2015-02-24 ENCOUNTER — Encounter (HOSPITAL_COMMUNITY): Payer: Self-pay | Admitting: Emergency Medicine

## 2015-02-24 DIAGNOSIS — Z9081 Acquired absence of spleen: Secondary | ICD-10-CM | POA: Diagnosis present

## 2015-02-24 DIAGNOSIS — D5700 Hb-SS disease with crisis, unspecified: Principal | ICD-10-CM | POA: Diagnosis present

## 2015-02-24 DIAGNOSIS — D72829 Elevated white blood cell count, unspecified: Secondary | ICD-10-CM | POA: Diagnosis present

## 2015-02-24 DIAGNOSIS — T50905A Adverse effect of unspecified drugs, medicaments and biological substances, initial encounter: Secondary | ICD-10-CM | POA: Diagnosis present

## 2015-02-24 DIAGNOSIS — F17200 Nicotine dependence, unspecified, uncomplicated: Secondary | ICD-10-CM | POA: Diagnosis present

## 2015-02-24 DIAGNOSIS — Z809 Family history of malignant neoplasm, unspecified: Secondary | ICD-10-CM

## 2015-02-24 DIAGNOSIS — L298 Other pruritus: Secondary | ICD-10-CM | POA: Diagnosis present

## 2015-02-24 DIAGNOSIS — F17210 Nicotine dependence, cigarettes, uncomplicated: Secondary | ICD-10-CM | POA: Diagnosis present

## 2015-02-24 DIAGNOSIS — R17 Unspecified jaundice: Secondary | ICD-10-CM | POA: Diagnosis present

## 2015-02-24 DIAGNOSIS — T402X5A Adverse effect of other opioids, initial encounter: Secondary | ICD-10-CM | POA: Diagnosis present

## 2015-02-24 DIAGNOSIS — L299 Pruritus, unspecified: Secondary | ICD-10-CM | POA: Diagnosis present

## 2015-02-24 DIAGNOSIS — Z8249 Family history of ischemic heart disease and other diseases of the circulatory system: Secondary | ICD-10-CM

## 2015-02-24 DIAGNOSIS — Z720 Tobacco use: Secondary | ICD-10-CM | POA: Diagnosis present

## 2015-02-24 DIAGNOSIS — J45909 Unspecified asthma, uncomplicated: Secondary | ICD-10-CM | POA: Diagnosis present

## 2015-02-24 DIAGNOSIS — J4520 Mild intermittent asthma, uncomplicated: Secondary | ICD-10-CM | POA: Diagnosis present

## 2015-02-24 HISTORY — DX: Adverse effect of unspecified drugs, medicaments and biological substances, initial encounter: T50.905A

## 2015-02-24 HISTORY — DX: Other pruritus: L29.8

## 2015-02-24 HISTORY — DX: Other disorders of bilirubin metabolism: E80.6

## 2015-02-24 LAB — CBC WITH DIFFERENTIAL/PLATELET
BASOS ABS: 0.1 10*3/uL (ref 0.0–0.1)
BASOS PCT: 0 % (ref 0–1)
Eosinophils Absolute: 0.2 10*3/uL (ref 0.0–0.7)
Eosinophils Relative: 1 % (ref 0–5)
HCT: 25.4 % — ABNORMAL LOW (ref 36.0–46.0)
HEMOGLOBIN: 9.3 g/dL — AB (ref 12.0–15.0)
LYMPHS PCT: 44 % (ref 12–46)
Lymphs Abs: 6.2 10*3/uL — ABNORMAL HIGH (ref 0.7–4.0)
MCH: 36.6 pg — ABNORMAL HIGH (ref 26.0–34.0)
MCHC: 36.6 g/dL — AB (ref 30.0–36.0)
MCV: 100 fL (ref 78.0–100.0)
Monocytes Absolute: 1.2 10*3/uL — ABNORMAL HIGH (ref 0.1–1.0)
Monocytes Relative: 8 % (ref 3–12)
NEUTROS PCT: 47 % (ref 43–77)
Neutro Abs: 6.5 10*3/uL (ref 1.7–7.7)
Platelets: 462 10*3/uL — ABNORMAL HIGH (ref 150–400)
RBC: 2.54 MIL/uL — ABNORMAL LOW (ref 3.87–5.11)
RDW: 17.1 % — ABNORMAL HIGH (ref 11.5–15.5)
WBC: 14.1 10*3/uL — AB (ref 4.0–10.5)

## 2015-02-24 LAB — BASIC METABOLIC PANEL
Anion gap: 6 (ref 5–15)
BUN: 8 mg/dL (ref 6–23)
CALCIUM: 9 mg/dL (ref 8.4–10.5)
CO2: 26 mmol/L (ref 19–32)
Chloride: 105 mmol/L (ref 96–112)
Creatinine, Ser: 0.77 mg/dL (ref 0.50–1.10)
GFR calc Af Amer: >90 mL/min (ref >90)
GFR calc non Af Amer: >90 mL/min (ref >90)
GLUCOSE: 93 mg/dL (ref 70–99)
Potassium: 4.2 mmol/L (ref 3.5–5.1)
Sodium: 137 mmol/L (ref 135–145)

## 2015-02-24 LAB — RETICULOCYTES
RBC.: 2.54 MIL/uL — ABNORMAL LOW (ref 3.87–5.11)
RETIC COUNT ABSOLUTE: 436.9 10*3/uL — AB (ref 19.0–186.0)
RETIC CT PCT: 17.2 % — AB (ref 0.4–3.1)

## 2015-02-24 MED ORDER — MORPHINE SULFATE 4 MG/ML IJ SOLN
6.0000 mg | Freq: Once | INTRAMUSCULAR | Status: AC
  Administered 2015-02-24: 6 mg via INTRAVENOUS
  Filled 2015-02-24: qty 2

## 2015-02-24 MED ORDER — SODIUM CHLORIDE 0.9 % IV BOLUS (SEPSIS)
500.0000 mL | Freq: Once | INTRAVENOUS | Status: AC
  Administered 2015-02-24: 500 mL via INTRAVENOUS

## 2015-02-24 NOTE — ED Provider Notes (Signed)
CSN: [id number]     Arrival date & time 02/24/15  2219/02/26 History  This chart was scribed for Elnora Morrison, MD by Tula Nakayama, ED Scribe. This patient was seen in room APA02/APA02 and the patient's care was started at 10:35 PM.    Chief Complaint  Patient presents with  . Sickle Cell Pain Crisis   The history is provided by the patient. No language interpreter was used.    HPI Comments: [name] is a 19 y.o. female with a history of sickle cell anemia and splenectomy who presents to the Emergency Department complaining of constant, moderate bilateral shoulder and bilateral ankle pain that started 6 hours ago. She tried Percocet-5 mg with some relief. Pt has a history of similar symptoms consistent with sickle cell crises. She denies fever, vomiting, diarrhea, CP, SOB and nausea as associated symptoms.  Past Medical History  Diagnosis Date  . Asthma   . Sickle cell disease   . Amenorrhea   . Hyperbilirubinemia 02/26/2015  . Drug-induced pruritus 02/26/2015  . GERD (gastroesophageal reflux disease)   . Headache   . Anemia     SICKLE CELL   Past Surgical History  Procedure Laterality Date  . 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 Tonsillectomy      Age 56   Family History  Problem Relation Age of Onset  . Hypertension Paternal Grandfather   . Sickle cell trait Father   . Cancer Mother     Died in February 25, 2009   History  Substance Use Topics  . Smoking status: Current Some Day Smoker -- 0.50 packs/day    Types: Cigarettes  . Smokeless tobacco: Never Used  . Alcohol Use: No     Comment: Denies 06/12/2014   OB History    No data available     Review of Systems  Constitutional: Negative for fever.  Respiratory: Negative for shortness of breath.   Cardiovascular: Negative for chest pain.  Gastrointestinal: Negative for nausea, vomiting and diarrhea.  Musculoskeletal: Positive for arthralgias.  All other systems reviewed and are negative.     Allergies   Review of patient's allergies indicates no known allergies.  Home Medications   Prior to Admission medications   Medication Sig Start Date End Date Taking? Authorizing Provider  oxyCODONE (OXY IR/ROXICODONE) 5 MG immediate release tablet Take 5 mg by mouth every 4 (four) hours as needed. pain   Yes Historical Provider, MD  folic acid (FOLVITE) 1 MG tablet Take 1 tablet (1 mg total) by mouth daily. 03/11/15   Vivi Barrack, MD  hydrocortisone 1 % lotion Apply 1 application topically 2 (two) times daily. Patient not taking: Reported on 02/25/2015 12/01/14   Alvina Chou, PA-C  ibuprofen (ADVIL,MOTRIN) 200 MG tablet Take 1,000 mg by mouth every 6 (six) hours as needed for headache or moderate pain.    Historical Provider, MD  morphine (MS CONTIN) 15 MG 12 hr tablet Take 1 tablet (15 mg total) by mouth every 12 (twelve) hours. 03/11/15   Vivi Barrack, MD  ondansetron (ZOFRAN ODT) 4 MG disintegrating tablet Take 1 tablet (4 mg total) by mouth every 8 (eight) hours as needed for nausea or vomiting. Patient not taking: Reported on 02/25/2015 12/01/14   Alvina Chou, PA-C  ondansetron (ZOFRAN) 4 MG tablet Take 1 tablet (4 mg total) by mouth every 6 (six) hours. Patient not taking: Reported on 12/01/2014 06/17/14   Ezequiel Essex, MD  oxyCODONE-acetaminophen (PERCOCET/ROXICET) 5-325 MG per  tablet Take 2 tablets by mouth every 4 (four) hours as needed for severe pain. 06/17/14   Ezequiel Essex, MD   BP 103/77 mmHg  Pulse 61  Temp(Src) 97.8 F (36.6 C) (Oral)  Resp 18  Ht 5\' 4"  (1.626 m)  Wt 135 lb (61.236 kg)  BMI 23.16 kg/m2  SpO2 98%  LMP 01/29/2015 Physical Exam  Constitutional: She appears well-developed and well-nourished. No distress.  HENT:  Head: Normocephalic and atraumatic.  Eyes: Conjunctivae and EOM are normal.  Neck: Neck supple. No tracheal deviation present.  Cardiovascular: Normal rate and regular rhythm.   Pulmonary/Chest: Effort normal and breath sounds normal. No  respiratory distress. She has no wheezes.  Abdominal: Soft. There is no tenderness. There is no guarding.  Musculoskeletal: She exhibits tenderness.  No effusion to ankle joint, no signs of rash or infection; hurts to move ankles and legs; mild tenderness to shoulders, no effusion to shoulder joints, no signs of rash or infection  Skin: Skin is warm and dry.  Psychiatric: She has a normal mood and affect. Her behavior is normal.  Nursing note and vitals reviewed.   ED Course  Procedures   DIAGNOSTIC STUDIES: Oxygen Saturation is 96% on RA, adequate by my interpretation.    COORDINATION OF CARE: 10:34 PM Discussed treatment plan with pt which includes lab work. Pt agreed to plan.   Labs Review Labs Reviewed  CBC WITH DIFFERENTIAL/PLATELET - Abnormal; Notable for the following:    WBC 14.1 (*)    RBC 2.54 (*)    Hemoglobin 9.3 (*)    HCT 25.4 (*)    MCH 36.6 (*)    MCHC 36.6 (*)    RDW 17.1 (*)    Platelets 462 (*)    Lymphs Abs 6.2 (*)    Monocytes Absolute 1.2 (*)    All other components within normal limits  RETICULOCYTES - Abnormal; Notable for the following:    Retic Ct Pct 17.2 (*)    RBC. 2.54 (*)    Retic Count, Manual 436.9 (*)    All other components within normal limits  COMPREHENSIVE METABOLIC PANEL - Abnormal; Notable for the following:    Total Bilirubin 8.3 (*)    All other components within normal limits  CBC - Abnormal; Notable for the following:    WBC 11.8 (*)    RBC 2.30 (*)    Hemoglobin 8.4 (*)    HCT 22.8 (*)    MCH 36.5 (*)    MCHC 36.8 (*)    RDW 18.5 (*)    Platelets 469 (*)    All other components within normal limits  DIFFERENTIAL - Abnormal; Notable for the following:    Neutrophils Relative % 39 (*)    Lymphocytes Relative 51 (*)    Lymphs Abs 6.1 (*)    All other components within normal limits  BASIC METABOLIC PANEL    Imaging Review No results found.   EKG Interpretation None      MDM   Final diagnoses:  Sickle cell  pain crisis   I personally performed the services described in this documentation, which was scribed in my presence. The recorded information has been reviewed and is accurate.  Patient presents with pain similar to sickle cell pain in the past. Patient well-appearing in ER, vitals normal. Plan for pain meds, blood work. No signs of infection clinically.  Filed Vitals:   02/26/15 1028 02/26/15 1150 02/26/15 1349 02/26/15 1523  BP: 103/47  103/77   Pulse:  63  61   Temp: 97.6 F (36.4 C)  97.8 F (36.6 C)   TempSrc: Oral  Oral   Resp: 14 13 21 18   Height:      Weight:      SpO2: 93% 99% 94% 98%    Patient signed out for recheck and final dispo.  Repeat pain meds given.   Elnora Morrison, MD 03/16/15 [phone]

## 2015-02-24 NOTE — ED Notes (Signed)
Patient c/o sickle cell pain in bilateral ankles and bilateral arms x 3 hours.

## 2015-02-24 NOTE — Discharge Instructions (Signed)
Follow-up closely with your hematologist.  If you were given medicines take as directed.  If you are on coumadin or contraceptives realize their levels and effectiveness is altered by many different medicines.  If you have any reaction (rash, tongues swelling, other) to the medicines stop taking and see a physician.   Please follow up as directed and return to the ER or see a physician for new or worsening symptoms.  Thank you. Filed Vitals:   02/24/15 2224  BP: 105/62  Pulse: 82  Temp: 99.1 F (37.3 C)  TempSrc: Oral  Resp: 20  Height: 5\' 4"  (1.626 m)  Weight: 135 lb (61.236 kg)  SpO2: 96%

## 2015-02-25 ENCOUNTER — Encounter (HOSPITAL_COMMUNITY): Payer: Self-pay | Admitting: Internal Medicine

## 2015-02-25 DIAGNOSIS — Z8249 Family history of ischemic heart disease and other diseases of the circulatory system: Secondary | ICD-10-CM | POA: Diagnosis not present

## 2015-02-25 DIAGNOSIS — Z720 Tobacco use: Secondary | ICD-10-CM | POA: Diagnosis not present

## 2015-02-25 DIAGNOSIS — D5700 Hb-SS disease with crisis, unspecified: Secondary | ICD-10-CM | POA: Diagnosis present

## 2015-02-25 DIAGNOSIS — F17210 Nicotine dependence, cigarettes, uncomplicated: Secondary | ICD-10-CM | POA: Diagnosis present

## 2015-02-25 DIAGNOSIS — J45909 Unspecified asthma, uncomplicated: Secondary | ICD-10-CM | POA: Diagnosis present

## 2015-02-25 DIAGNOSIS — T402X5A Adverse effect of other opioids, initial encounter: Secondary | ICD-10-CM | POA: Diagnosis present

## 2015-02-25 DIAGNOSIS — Z809 Family history of malignant neoplasm, unspecified: Secondary | ICD-10-CM | POA: Diagnosis not present

## 2015-02-25 DIAGNOSIS — Z9081 Acquired absence of spleen: Secondary | ICD-10-CM | POA: Diagnosis present

## 2015-02-25 DIAGNOSIS — L299 Pruritus, unspecified: Secondary | ICD-10-CM | POA: Diagnosis present

## 2015-02-25 DIAGNOSIS — R17 Unspecified jaundice: Secondary | ICD-10-CM | POA: Diagnosis present

## 2015-02-25 DIAGNOSIS — D72829 Elevated white blood cell count, unspecified: Secondary | ICD-10-CM | POA: Diagnosis present

## 2015-02-25 MED ORDER — DIPHENHYDRAMINE HCL 50 MG/ML IJ SOLN
12.5000 mg | Freq: Four times a day (QID) | INTRAMUSCULAR | Status: DC | PRN
  Administered 2015-02-25 – 2015-02-26 (×3): 12.5 mg via INTRAVENOUS
  Filled 2015-02-25 (×3): qty 1

## 2015-02-25 MED ORDER — DIPHENHYDRAMINE HCL 50 MG/ML IJ SOLN
12.5000 mg | Freq: Four times a day (QID) | INTRAMUSCULAR | Status: DC | PRN
  Administered 2015-02-25: 12.5 mg via INTRAVENOUS
  Filled 2015-02-25: qty 1

## 2015-02-25 MED ORDER — DOCUSATE SODIUM 100 MG PO CAPS
100.0000 mg | ORAL_CAPSULE | Freq: Two times a day (BID) | ORAL | Status: DC
  Administered 2015-02-25 – 2015-02-26 (×3): 100 mg via ORAL
  Filled 2015-02-25 (×3): qty 1

## 2015-02-25 MED ORDER — NALOXONE HCL 0.4 MG/ML IJ SOLN: 0.4000 mg | INTRAMUSCULAR | Status: DC | PRN

## 2015-02-25 MED ORDER — HYDROMORPHONE 0.3 MG/ML IV SOLN
INTRAVENOUS | Status: DC
  Administered 2015-02-25: 0.78 mg via INTRAVENOUS
  Administered 2015-02-25: 14:00:00 via INTRAVENOUS
  Administered 2015-02-26: 3.3 mg via INTRAVENOUS
  Administered 2015-02-26: 1.85 mg via INTRAVENOUS
  Administered 2015-02-26: 0.3 mg via INTRAVENOUS
  Administered 2015-02-26: 0.6 mg via INTRAVENOUS
  Filled 2015-02-25 (×2): qty 25

## 2015-02-25 MED ORDER — IBUPROFEN 400 MG PO TABS: 200.0000 mg | ORAL_TABLET | Freq: Four times a day (QID) | ORAL | Status: DC | PRN

## 2015-02-25 MED ORDER — ONDANSETRON HCL 4 MG/2ML IJ SOLN
4.0000 mg | Freq: Four times a day (QID) | INTRAMUSCULAR | Status: DC | PRN
  Administered 2015-02-25 – 2015-02-26 (×2): 4 mg via INTRAVENOUS
  Filled 2015-02-25 (×2): qty 2

## 2015-02-25 MED ORDER — ALBUTEROL SULFATE (2.5 MG/3ML) 0.083% IN NEBU: 3.0000 mL | INHALATION_SOLUTION | RESPIRATORY_TRACT | Status: DC | PRN

## 2015-02-25 MED ORDER — SODIUM CHLORIDE 0.45 % IV SOLN
INTRAVENOUS | Status: DC
  Administered 2015-02-25 – 2015-02-26 (×5): via INTRAVENOUS

## 2015-02-25 MED ORDER — HYDROMORPHONE HCL 1 MG/ML IJ SOLN
1.0000 mg | INTRAMUSCULAR | Status: DC | PRN
Start: 2015-02-25 — End: 2015-02-25
  Administered 2015-02-25 (×3): 1 mg via INTRAVENOUS
  Filled 2015-02-25 (×3): qty 1

## 2015-02-25 MED ORDER — ONDANSETRON HCL 4 MG PO TABS: 4.0000 mg | ORAL_TABLET | Freq: Four times a day (QID) | ORAL | Status: DC | PRN

## 2015-02-25 MED ORDER — MORPHINE SULFATE 4 MG/ML IJ SOLN
6.0000 mg | Freq: Once | INTRAMUSCULAR | Status: AC
  Administered 2015-02-25: 6 mg via INTRAVENOUS
  Filled 2015-02-25: qty 2

## 2015-02-25 MED ORDER — HEPARIN SODIUM (PORCINE) 5000 UNIT/ML IJ SOLN
5000.0000 [IU] | Freq: Three times a day (TID) | INTRAMUSCULAR | Status: DC
  Administered 2015-02-25 – 2015-02-26 (×4): 5000 [IU] via SUBCUTANEOUS
  Filled 2015-02-25 (×4): qty 1

## 2015-02-25 MED ORDER — HYDROMORPHONE HCL 2 MG/ML IJ SOLN
2.0000 mg | Freq: Once | INTRAMUSCULAR | Status: AC
  Administered 2015-02-25: 2 mg via INTRAVENOUS
  Filled 2015-02-25: qty 1

## 2015-02-25 MED ORDER — SODIUM CHLORIDE 0.9 % IJ SOLN: 9.0000 mL | INTRAMUSCULAR | Status: DC | PRN

## 2015-02-25 NOTE — ED Notes (Signed)
Patient resting in bed, eyes closed, even rise and fall of chest. NAD noted, patient snoring.

## 2015-02-25 NOTE — ED Notes (Signed)
Patient yelling and cursing at this time stating "ya'll playing my fucking face, aint no body admitting me" "yall aint dealing with this" i explained to patient that we are waiting for a bed assignment and for the admitting physician to finish his orders.

## 2015-02-25 NOTE — ED Notes (Signed)
Patient asleep when entered into room, patient aroused and states "my pain is still the same" while vital signs are being taken, patient was falling asleep and snoring. NAD noted

## 2015-02-25 NOTE — ED Notes (Signed)
Patient ambulatory to restroom  ?

## 2015-02-25 NOTE — ED Provider Notes (Signed)
Patient had been given morphine 6 mg IV twice. She reports continued pain mainly in her shoulders and her lower legs which she has had before with her sickle cell crisis. She states her pain is unchanged. She states normally when she comes the ED she has to be admitted. Review of her chart shows her last admission was in November. Patient denies chest pain or shortness of breath. Patient appears uncomfortable. She was givenDilaudid 2 mg IV.  PCP [facility]  Recheck at 03:40 pt states she is still having pain, but is requesting juice to drink. She is agreeable to admission.   Results for orders placed or performed during the hospital encounter of 02/24/15  CBC with Differential/Platelet  Result Value Ref Range   WBC 14.1 (H) 4.0 - 10.5 K/uL   RBC 2.54 (L) 3.87 - 5.11 MIL/uL   Hemoglobin 9.3 (L) 12.0 - 15.0 g/dL   HCT 25.4 (L) 36.0 - 46.0 %   MCV 100.0 78.0 - 100.0 fL   MCH 36.6 (H) 26.0 - 34.0 pg   MCHC 36.6 (H) 30.0 - 36.0 g/dL   RDW 17.1 (H) 11.5 - 15.5 %   Platelets 462 (H) 150 - 400 K/uL   Neutrophils Relative % 47 43 - 77 %   Neutro Abs 6.5 1.7 - 7.7 K/uL   Lymphocytes Relative 44 12 - 46 %   Lymphs Abs 6.2 (H) 0.7 - 4.0 K/uL   Monocytes Relative 8 3 - 12 %   Monocytes Absolute 1.2 (H) 0.1 - 1.0 K/uL   Eosinophils Relative 1 0 - 5 %   Eosinophils Absolute 0.2 0.0 - 0.7 K/uL   Basophils Relative 0 0 - 1 %   Basophils Absolute 0.1 0.0 - 0.1 K/uL   WBC Morphology WHITE COUNT CONFIRMED ON SMEAR    RBC Morphology TARGET CELLS   Basic metabolic panel  Result Value Ref Range   Sodium 137 135 - 145 mmol/L   Potassium 4.2 3.5 - 5.1 mmol/L   Chloride 105 96 - 112 mmol/L   CO2 26 19 - 32 mmol/L   Glucose, Bld 93 70 - 99 mg/dL   BUN 8 6 - 23 mg/dL   Creatinine, Ser 0.77 0.50 - 1.10 mg/dL   Calcium 9.0 8.4 - 10.5 mg/dL   GFR calc non Af Amer >90 >90 mL/min   GFR calc Af Amer >90 >90 mL/min   Anion gap 6 5 - 15  Reticulocytes  Result Value Ref Range   Retic Ct Pct 17.2 (H) 0.4 - 3.1 %    RBC. 2.54 (L) 3.87 - 5.11 MIL/uL   Retic Count, Manual 436.9 (H) 19.0 - 186.0 K/uL   Laboratory interpretation all normal except stable anemia, appropriately elevated reticulocyte count      04:03 Dr Marin Comment, will admit   Diagnoses that have been ruled out:  None  Diagnoses that are still under consideration:  None  Final diagnoses:  Sickle cell pain crisis    Plan admission  Rolland Porter, MD, Barbette Or, MD 02/25/15 [id number]

## 2015-02-25 NOTE — Care Management Utilization Note (Signed)
UR completed 

## 2015-02-25 NOTE — Progress Notes (Signed)
The patient is a 19 year old with a history of asthma and sickle cell anemia/disease, who was admitted this morning by Dr. Marin Comment for acute sickle cell crisis. She was briefly seen and examined. Her chart, vital signs, and laboratory studies were reviewed. Agree with current management. We will add as needed IV diphenhydramine for pruritus from IV hydromorphone. The patient does not 1 IV hydromorphone discontinued, but would rather be treated with diphenhydramine.

## 2015-02-25 NOTE — ED Notes (Signed)
Patient pacing in room at this time yelling and cursing on her cell phone

## 2015-02-25 NOTE — ED Notes (Signed)
Patient ringing call be yelling "yall are racist, playing me like this" explained to patient that we are waiting for a bed assignment.

## 2015-02-25 NOTE — ED Notes (Signed)
Patient resting in bed at this time. No needs voiced. 

## 2015-02-25 NOTE — H&P (Signed)
[facility] History and Physical  [name] MEQ:[id number] DOB: 07-26-1996    PCP:   Dimas Chyle, MD   Chief Complaint: Sickle cell crisis   HPI: [name] is an 19 y.o. female with hx of sickle cell disease and frequent sickle cell crisis, hx of asthma, and amenorrhea, presents today to the ER with usual shoulders and leg pain typical of sickle cell crisis.  There has been no chest pain or shortness of breath, fever, or chills.  Evaluation in the ER included a reticulocyte count of 17 percent,  Hb of 9.3 g per dL, with normal renal function test. In the ER,  IVF and IV pain medication was given, but patient continue to have pain, and hospitalist was asked to admit for sickle cell crisis.  Rewiew of Systems:  Constitutional: Negative for malaise, fever and chills. No significant weight loss or weight gain Eyes: Negative for eye pain, redness and discharge, diplopia, visual changes, or flashes of light. ENMT: Negative for ear pain, hoarseness, nasal congestion, sinus pressure and sore throat. No headaches; tinnitus, drooling, or problem swallowing. Cardiovascular: Negative for chest pain, palpitations, diaphoresis, dyspnea and peripheral edema. ; No orthopnea, PND Respiratory: Negative for cough, hemoptysis, wheezing and stridor. No pleuritic chestpain. Gastrointestinal: Negative for nausea, vomiting, diarrhea, constipation, abdominal pain, melena, blood in stool, hematemesis, jaundice and rectal bleeding.    Genitourinary: Negative for frequency, dysuria, incontinence,flank pain and hematuria; Musculoskeletal: Negative for swelling and trauma.;  Skin: . Negative for pruritus, rash, abrasions, bruising and skin lesion.; ulcerations Neuro: Negative for headache, lightheadedness and neck stiffness. Negative for weakness, altered level of consciousness , altered mental status, extremity weakness, burning feet, involuntary movement, seizure and syncope.  Psych:  negative for anxiety, depression, insomnia, tearfulness, panic attacks, hallucinations, paranoia, suicidal or homicidal ideation     Past Medical History  Diagnosis Date  . Asthma   . Sickle cell disease   . Amenorrhea     Past Surgical History  Procedure Laterality Date  . Splenectomy      Age 24 for sequestration  . Tonsillectomy      Age 66    Medications:  HOME MEDS: Prior to Admission medications   Medication Sig Start Date End Date Taking? Authorizing Provider  albuterol (PROVENTIL HFA;VENTOLIN HFA) 108 (90 BASE) MCG/ACT inhaler Inhale 2 puffs into the lungs every 4 (four) hours as needed for wheezing or shortness of breath. For shortness of breath 08/01/13   Marin Olp, MD  hydrocortisone 1 % lotion Apply 1 application topically 2 (two) times daily. 12/01/14   Kaitlyn Szekalski, PA-C  ibuprofen (ADVIL,MOTRIN) 200 MG tablet Take 200 mg by mouth every 6 (six) hours as needed for moderate pain.    Historical Provider, MD  ondansetron (ZOFRAN ODT) 4 MG disintegrating tablet Take 1 tablet (4 mg total) by mouth every 8 (eight) hours as needed for nausea or vomiting. 12/01/14   Kaitlyn Szekalski, PA-C  ondansetron (ZOFRAN) 4 MG tablet Take 1 tablet (4 mg total) by mouth every 6 (six) hours. Patient not taking: Reported on 12/01/2014 06/17/14   Ezequiel Essex, MD  oxyCODONE (OXY IR/ROXICODONE) 5 MG immediate release tablet Take 5 mg by mouth every 4 (four) hours as needed. pain    Historical Provider, MD  oxyCODONE-acetaminophen (PERCOCET/ROXICET) 5-325 MG per tablet Take 2 tablets by mouth every 4 (four) hours as needed for severe pain. Patient not taking: Reported on 12/01/2014 06/17/14   Ezequiel Essex, MD     Allergies:  No Known Allergies  Social History:   reports that she has been smoking Cigarettes.  She has been smoking about 0.50 packs per day. She has never used smokeless tobacco. She reports that she does not drink alcohol or use illicit drugs.  Family  History: Family History  Problem Relation Age of Onset  . Hypertension Paternal Grandfather   . Sickle cell trait Father   . Cancer Mother     Died in February 09, 2009     Physical Exam: Filed Vitals:   02/25/15 0102 02/25/15 0235 02/25/15 0420 02/25/15 0430  BP: 103/53 101/48 122/58 105/60  Pulse: 52 75 58 58  Temp:   98.3 F (36.8 C)   TempSrc:   Oral   Resp: 16 16 16    Height:      Weight:      SpO2: 92% 96% 94% 94%   Blood pressure 105/60, pulse 58, temperature 98.3 F (36.8 C), temperature source Oral, resp. rate 16, height 5\' 4"  (1.626 m), weight 61.236 kg (135 lb), last menstrual period 01/29/2015, SpO2 94 %.  GEN:  Pleasant  patient lying in the stretcher in no acute distress; cooperative with exam. PSYCH:  alert and oriented x4; does not appear anxious or depressed; affect is appropriate. HEENT: Mucous membranes pink and anicteric; PERRLA; EOM intact; no cervical lymphadenopathy nor thyromegaly or carotid bruit; no JVD; There were no stridor. Neck is very supple. Breasts:: Not examined CHEST WALL: No tenderness CHEST: Normal respiration, clear to auscultation bilaterally.  HEART: Regular rate and rhythm.  There are no murmur, rub, or gallops.   BACK: No kyphosis or scoliosis; no CVA tenderness ABDOMEN: soft and non-tender; no masses, no organomegaly, normal abdominal bowel sounds; no pannus; no intertriginous candida. There is no rebound and no distention. Rectal Exam: Not done EXTREMITIES: No bone or joint deformity; age-appropriate arthropathy of the hands and knees; no edema; no ulcerations.  There is no calf tenderness. Genitalia: not examined PULSES: 2+ and symmetric SKIN: Normal hydration no rash or ulceration CNS: Cranial nerves 2-12 grossly intact no focal lateralizing neurologic deficit.  Speech is fluent; uvula elevated with phonation, facial symmetry and tongue midline. DTR are normal bilaterally, cerebella exam is intact, barbinski is negative and strengths are  equaled bilaterally.  No sensory loss.   Labs on Admission:  Basic Metabolic Panel:  Recent Labs Lab 02/24/15 2258  NA 137  K 4.2  CL 105  CO2 26  GLUCOSE 93  BUN 8  CREATININE 0.77  CALCIUM 9.0   Liver Function Tests: No results for input(s): AST, ALT, ALKPHOS, BILITOT, PROT, ALBUMIN in the last 168 hours. No results for input(s): LIPASE, AMYLASE in the last 168 hours. CBC:  Recent Labs Lab 02/24/15 2258  WBC 14.1*  NEUTROABS 6.5  HGB 9.3*  HCT 25.4*  MCV 100.0  PLT 462*    Assessment/Plan Present on Admission:  . Sickle cell pain crisis . Tobacco abuse . Hb-SS disease with crisis . Sickle cell anemia with crisis   PLAN:  This patient presents with sickle cell crisis and will be admitted for further treatment.  Will treat with IVF, IV pain medications as tolerated, and supplement Oxygen.  Patient is stable, full code, and will be admitted to general medical floor. Home meds will be continued.  Will admit patient to [facility] service Thank you for allowing me to partake in the care of your nice patient.    Other plans as per orders.  Code Status: FULL CODE.  Orvan Falconer, MD. [facility] Pager [phone] 7pm to 7am.  02/25/2015, 5:14 AM

## 2015-02-26 ENCOUNTER — Encounter (HOSPITAL_COMMUNITY): Payer: Self-pay | Admitting: Internal Medicine

## 2015-02-26 DIAGNOSIS — L299 Pruritus, unspecified: Secondary | ICD-10-CM

## 2015-02-26 DIAGNOSIS — L298 Other pruritus: Secondary | ICD-10-CM

## 2015-02-26 DIAGNOSIS — T50905A Adverse effect of unspecified drugs, medicaments and biological substances, initial encounter: Secondary | ICD-10-CM

## 2015-02-26 HISTORY — DX: Other pruritus: L29.8

## 2015-02-26 HISTORY — DX: Other disorders of bilirubin metabolism: E80.6

## 2015-02-26 HISTORY — DX: Adverse effect of unspecified drugs, medicaments and biological substances, initial encounter: T50.905A

## 2015-02-26 LAB — COMPREHENSIVE METABOLIC PANEL
ALT: 13 U/L (ref 0–35)
ANION GAP: 7 (ref 5–15)
AST: 21 U/L (ref 0–37)
Albumin: 3.8 g/dL (ref 3.5–5.2)
Alkaline Phosphatase: 46 U/L (ref 39–117)
BUN: 7 mg/dL (ref 6–23)
CHLORIDE: 105 mmol/L (ref 96–112)
CO2: 23 mmol/L (ref 19–32)
Calcium: 8.7 mg/dL (ref 8.4–10.5)
Creatinine, Ser: 0.52 mg/dL (ref 0.50–1.10)
GFR calc Af Amer: >90 mL/min (ref >90)
GFR calc non Af Amer: >90 mL/min (ref >90)
GLUCOSE: 77 mg/dL (ref 70–99)
POTASSIUM: 3.8 mmol/L (ref 3.5–5.1)
SODIUM: 135 mmol/L (ref 135–145)
TOTAL PROTEIN: 6.4 g/dL (ref 6.0–8.3)
Total Bilirubin: 8.3 mg/dL — ABNORMAL HIGH (ref 0.3–1.2)

## 2015-02-26 LAB — DIFFERENTIAL
Basophils Absolute: 0 10*3/uL (ref 0.0–0.1)
Basophils Relative: 0 % (ref 0–1)
EOS ABS: 0.1 10*3/uL (ref 0.0–0.7)
EOS PCT: 1 % (ref 0–5)
Lymphocytes Relative: 51 % — ABNORMAL HIGH (ref 12–46)
Lymphs Abs: 6.1 10*3/uL — ABNORMAL HIGH (ref 0.7–4.0)
MONOS PCT: 9 % (ref 3–12)
Monocytes Absolute: 1 10*3/uL (ref 0.1–1.0)
NEUTROS ABS: 4.6 10*3/uL (ref 1.7–7.7)
NEUTROS PCT: 39 % — AB (ref 43–77)

## 2015-02-26 LAB — CBC
HCT: 22.8 % — ABNORMAL LOW (ref 36.0–46.0)
Hemoglobin: 8.4 g/dL — ABNORMAL LOW (ref 12.0–15.0)
MCH: 36.5 pg — ABNORMAL HIGH (ref 26.0–34.0)
MCHC: 36.8 g/dL — ABNORMAL HIGH (ref 30.0–36.0)
MCV: 99.1 fL (ref 78.0–100.0)
PLATELETS: 469 10*3/uL — AB (ref 150–400)
RBC: 2.3 MIL/uL — ABNORMAL LOW (ref 3.87–5.11)
RDW: 18.5 % — AB (ref 11.5–15.5)
WBC: 11.8 10*3/uL — ABNORMAL HIGH (ref 4.0–10.5)

## 2015-02-26 MED ORDER — OXYCODONE HCL 5 MG PO TABS: 5.0000 mg | ORAL_TABLET | Freq: Four times a day (QID) | ORAL | Status: DC | PRN

## 2015-02-26 MED ORDER — KETOROLAC TROMETHAMINE 15 MG/ML IJ SOLN: 15.0000 mg | Freq: Three times a day (TID) | INTRAMUSCULAR | Status: DC

## 2015-02-26 NOTE — Plan of Care (Signed)
Problem: Discharge Progression Outcomes Goal: Other Discharge Outcomes/Goals Outcome: Not Met (add Reason) Pt refused care and discharged AMA

## 2015-02-26 NOTE — Progress Notes (Signed)
[facility] PROGRESS NOTE  [name] ZOX:[id number] DOB: 30-Sep-1996 DOA: 02/24/2015 PCP: Dimas Chyle, MD  Assessment/Plan: Sickle cell pain crisis: continues with pain but no worse. Hg at baseline according to chart on admission and drifting down somewhat today. Continue PCA Dilaudid and Iv fluids. No chest xray on admission but no clinical indication of acute chest. Urinalysis unremarkable. Oxygen saturation level >90%. Hopefully can transition to po pain meds tomorrow.   Marland Kitchen Hb-SS disease: s/p splenectomy for sequestration. Denies seeing any provider regularly. Last hospitalization 08/2014 for crisis. Home meds do not include hydroxyurea. Chart review indicates past referral to [facility] but she did not follow up. Of note hx of leaving AMA if pain not managed  . leukocytosis: trending down this am. She is afebrile. Will  Monitor  Asthma: mild. Appears to be at baseline. Continue nebs prn   Tobacco abuse: cessation counseling offered.   Code Status: full Family Communication: none present Disposition Plan: home when ready   Consultants:  none  Procedures:  none  Antibiotics:  none  HPI/Subjective: Sitting up in bed talking on phone. Reports "pain no different". Complains "cafeteria wont bring me the food i want and the IV pump wont stop beeping".   Objective: Filed Vitals:   02/26/15 1028  BP: 103/47  Pulse: 63  Temp: 97.6 F (36.4 C)  Resp: 14    Intake/Output Summary (Last 24 hours) at 02/26/15 1037 Last data filed at 02/26/15 0649  Gross per 24 hour  Intake 2813.5 ml  Output      0 ml  Net 2813.5 ml   Filed Weights   02/24/15 2224  Weight: 61.236 kg (135 lb)    Exam:   General:  Well nourished appears comfortable  Cardiovascular: RRR +murmur no gallup No LE edema  Respiratory: BS diminished throughout no wheeze non-labored  Abdomen: soft non-tender to palpation  Musculoskeletal: no clubbing or cyanosos   Data Reviewed: Basic  Metabolic Panel:  Recent Labs Lab 02/24/15 2258 02/26/15 0522  NA 137 135  K 4.2 3.8  CL 105 105  CO2 26 23  GLUCOSE 93 77  BUN 8 7  CREATININE 0.77 0.52  CALCIUM 9.0 8.7   Liver Function Tests:  Recent Labs Lab 02/26/15 0522  AST 21  ALT 13  ALKPHOS 46  BILITOT 8.3*  PROT 6.4  ALBUMIN 3.8   No results for input(s): LIPASE, AMYLASE in the last 168 hours. No results for input(s): AMMONIA in the last 168 hours. CBC:  Recent Labs Lab 02/24/15 2258 02/26/15 0522  WBC 14.1* 11.8*  NEUTROABS 6.5  --   HGB 9.3* 8.4*  HCT 25.4* 22.8*  MCV 100.0 99.1  PLT 462* 469*   Cardiac Enzymes: No results for input(s): CKTOTAL, CKMB, CKMBINDEX, TROPONINI in the last 168 hours. BNP (last 3 results) No results for input(s): BNP in the last 8760 hours.  ProBNP (last 3 results) No results for input(s): PROBNP in the last 8760 hours.  CBG: No results for input(s): GLUCAP in the last 168 hours.  No results found for this or any previous visit (from the past 240 hour(s)).   Studies: No results found.  Scheduled Meds: . docusate sodium  100 mg Oral BID  . heparin  5,000 Units Subcutaneous 3 times per day  . HYDROmorphone PCA 0.3 mg/mL   Intravenous 6 times per day   Continuous Infusions: . sodium chloride 150 mL/hr at 02/26/15 8119    Principal Problem:   Sickle cell pain crisis Active  Problems:   Hb-SS disease with crisis   Tobacco abuse   Sickle cell anemia with crisis   Hyperbilirubinemia   Drug-induced pruritus    Time spent: 35 minutes    [facility] Pager [phone]. If 7PM-7AM, please contact night-coverage at [email or web address], password [facility] 02/26/2015, 10:37 AM  LOS: 1 day
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
## 2015-02-26 NOTE — Care Management Note (Signed)
    Page 1 of 1   02/26/2015     7:44:04 AM CARE MANAGEMENT NOTE 02/26/2015  Patient:  [name], [name]   Account Number:  [id number]  Date Initiated:  02/26/2015  Documentation initiated by:  Jolene Provost  Subjective/Objective Assessment:   Pt from home, independent at baseline. Will DC home with self care, no CM needs.     Action/Plan:   Anticipated DC Date:  03/01/2015   Anticipated DC Plan:  [place]  CM consult      Choice offered to / List presented to:             Status of service:  Completed, signed off Medicare Important Message given?   (If response is "NO", the following Medicare IM given date fields will be blank) Date Medicare IM given:   Medicare IM given by:   Date Additional Medicare IM given:   Additional Medicare IM given by:    Discharge Disposition:    Per UR Regulation:    If discussed at Long Length of Stay Meetings, dates discussed:    Comments:  02/26/2015 [place], RN, MSN, CM

## 2015-03-08 ENCOUNTER — Encounter (HOSPITAL_COMMUNITY): Payer: Self-pay | Admitting: Emergency Medicine

## 2015-03-08 ENCOUNTER — Emergency Department (HOSPITAL_COMMUNITY): Payer: Medicare Other

## 2015-03-08 ENCOUNTER — Inpatient Hospital Stay (HOSPITAL_COMMUNITY)
Admission: EM | Admit: 2015-03-08 | Discharge: 2015-03-10 | DRG: 812 | Discharge status: Against Medical Advice | Payer: Medicare Other | Attending: Family Medicine | Admitting: Family Medicine

## 2015-03-08 DIAGNOSIS — Z9081 Acquired absence of spleen: Secondary | ICD-10-CM | POA: Diagnosis present

## 2015-03-08 DIAGNOSIS — R17 Unspecified jaundice: Secondary | ICD-10-CM | POA: Diagnosis present

## 2015-03-08 DIAGNOSIS — D5700 Hb-SS disease with crisis, unspecified: Secondary | ICD-10-CM | POA: Diagnosis present

## 2015-03-08 DIAGNOSIS — F17210 Nicotine dependence, cigarettes, uncomplicated: Secondary | ICD-10-CM

## 2015-03-08 DIAGNOSIS — R509 Fever, unspecified: Secondary | ICD-10-CM

## 2015-03-08 HISTORY — DX: Headache, unspecified: R51.9

## 2015-03-08 HISTORY — DX: Gastro-esophageal reflux disease without esophagitis: K21.9

## 2015-03-08 HISTORY — DX: Headache: R51

## 2015-03-08 HISTORY — DX: Anemia, unspecified: D64.9

## 2015-03-08 LAB — URINALYSIS, ROUTINE W REFLEX MICROSCOPIC
Bilirubin Urine: NEGATIVE
Glucose, UA: NEGATIVE mg/dL
Hgb urine dipstick: NEGATIVE
KETONES UR: NEGATIVE mg/dL
Leukocytes, UA: NEGATIVE
Nitrite: NEGATIVE
PROTEIN: NEGATIVE mg/dL
SPECIFIC GRAVITY, URINE: 1.012 (ref 1.005–1.030)
UROBILINOGEN UA: 1 mg/dL (ref 0.0–1.0)
pH: 5.5 (ref 5.0–8.0)

## 2015-03-08 LAB — COMPREHENSIVE METABOLIC PANEL
ALBUMIN: 4.5 g/dL (ref 3.5–5.0)
ALT: 17 U/L (ref 14–54)
AST: 25 U/L (ref 15–41)
Alkaline Phosphatase: 54 U/L (ref 38–126)
Anion gap: 11 (ref 5–15)
BUN: 6 mg/dL (ref 6–20)
CALCIUM: 9.4 mg/dL (ref 8.9–10.3)
CHLORIDE: 101 mmol/L (ref 101–111)
CO2: 22 mmol/L (ref 22–32)
CREATININE: 0.93 mg/dL (ref 0.44–1.00)
GFR calc Af Amer: >60 mL/min (ref >60)
Glucose, Bld: 105 mg/dL — ABNORMAL HIGH (ref 70–99)
Potassium: 4 mmol/L (ref 3.5–5.1)
Sodium: 134 mmol/L — ABNORMAL LOW (ref 135–145)
Total Bilirubin: 8.3 mg/dL — ABNORMAL HIGH (ref 0.3–1.2)
Total Protein: 8.3 g/dL — ABNORMAL HIGH (ref 6.5–8.1)

## 2015-03-08 LAB — CBC WITH DIFFERENTIAL/PLATELET
Basophils Absolute: 0 10*3/uL (ref 0.0–0.1)
Basophils Relative: 0 % (ref 0–1)
Eosinophils Absolute: 0 10*3/uL (ref 0.0–0.7)
Eosinophils Relative: 0 % (ref 0–5)
HEMATOCRIT: 27.6 % — AB (ref 36.0–46.0)
HEMOGLOBIN: 10 g/dL — AB (ref 12.0–15.0)
LYMPHS PCT: 8 % — AB (ref 12–46)
Lymphs Abs: 1.4 10*3/uL (ref 0.7–4.0)
MCH: 35.7 pg — ABNORMAL HIGH (ref 26.0–34.0)
MCHC: 36.2 g/dL — ABNORMAL HIGH (ref 30.0–36.0)
MCV: 98.6 fL (ref 78.0–100.0)
MONOS PCT: 10 % (ref 3–12)
Monocytes Absolute: 1.7 10*3/uL — ABNORMAL HIGH (ref 0.1–1.0)
NEUTROS ABS: 13.9 10*3/uL — AB (ref 1.7–7.7)
Neutrophils Relative %: 82 % — ABNORMAL HIGH (ref 43–77)
Platelets: 563 10*3/uL — ABNORMAL HIGH (ref 150–400)
RBC: 2.8 MIL/uL — ABNORMAL LOW (ref 3.87–5.11)
RDW: 16.6 % — ABNORMAL HIGH (ref 11.5–15.5)
WBC: 17 10*3/uL — AB (ref 4.0–10.5)

## 2015-03-08 LAB — RETICULOCYTES
RBC.: 2.8 MIL/uL — ABNORMAL LOW (ref 3.87–5.11)
Retic Count, Absolute: 380.8 10*3/uL — ABNORMAL HIGH (ref 19.0–186.0)
Retic Ct Pct: 13.6 % — ABNORMAL HIGH (ref 0.4–3.1)

## 2015-03-08 LAB — POC URINE PREG, ED: Preg Test, Ur: NEGATIVE

## 2015-03-08 MED ORDER — SODIUM CHLORIDE 0.9 % IV SOLN
1000.0000 mL | INTRAVENOUS | Status: DC
  Administered 2015-03-09 (×2): 1000 mL via INTRAVENOUS
  Administered 2015-03-10: 500 mL via INTRAVENOUS

## 2015-03-08 MED ORDER — SODIUM CHLORIDE 0.9 % IV SOLN
1000.0000 mL | Freq: Once | INTRAVENOUS | Status: AC
  Administered 2015-03-08: 1000 mL via INTRAVENOUS

## 2015-03-08 MED ORDER — HYDROMORPHONE HCL 1 MG/ML IJ SOLN
2.0000 mg | INTRAMUSCULAR | Status: DC | PRN
  Administered 2015-03-08: 2 mg via INTRAVENOUS
  Filled 2015-03-08 (×2): qty 2

## 2015-03-08 MED ORDER — DIPHENHYDRAMINE HCL 50 MG/ML IJ SOLN
25.0000 mg | Freq: Once | INTRAMUSCULAR | Status: AC
  Administered 2015-03-08: 25 mg via INTRAVENOUS
  Filled 2015-03-08: qty 1

## 2015-03-08 MED ORDER — ACETAMINOPHEN 325 MG PO TABS
650.0000 mg | ORAL_TABLET | Freq: Once | ORAL | Status: AC
  Administered 2015-03-08: 650 mg via ORAL

## 2015-03-08 MED ORDER — ACETAMINOPHEN 325 MG PO TABS
ORAL_TABLET | ORAL | Status: AC
  Filled 2015-03-08: qty 2

## 2015-03-08 NOTE — ED Notes (Signed)
Pt. reports sickle cell pain at upper back , back of neck , bilateral knees onset last night unrelieved by prescription Oxycodone .

## 2015-03-08 NOTE — ED Provider Notes (Signed)
CSN: [id number]     Arrival date & time 03/08/15  1959 History   First MD Initiated Contact with Patient 03/08/15 02/13/04     This chart was scribed for Delora Fuel, MD by Forrestine Him, ED Scribe. This patient was seen in room A01C/A01C and the patient's care was started 11:14 PM.   Chief Complaint  Patient presents with  . Sickle Cell Pain Crisis    The history is provided by the patient. No language interpreter was used.     HPI Comments: TABITHA RIGGINS is a 19 y.o. female with a PMHx of sickle cell disease, asthma, ad hyperbilirubinemia who presents to the Emergency Department here for sickle cell pain crisis x 1 day. Pt reports constant, ongoing, unchanged pain to bilateral knees, neck, and back. Currently pain is rated 8/10. Ms. Pledger mentions 2 episodes of hematemesis onset 4 PM this afternoon along with a fever of 100.1, chills, and diaphoresis. Pt has tried prescribed Oxycodone for break through pain without any improvement for symptoms. She denies any blood in stools. No recent cough, urinary frequency, dysuria, hematuria, or diarrhea. Pt admits current pain is consisting with typical sickle cell flare ups. No known allergies to medications.  Past Medical History  Diagnosis Date  . Asthma   . Sickle cell disease   . Amenorrhea   . Hyperbilirubinemia 02/26/2015  . Drug-induced pruritus 02/26/2015   Past Surgical History  Procedure Laterality Date  . 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 Tonsillectomy      Age 10   Family History  Problem Relation Age of Onset  . Hypertension Paternal Grandfather   . Sickle cell trait Father   . Cancer Mother     Died in 2009/02/12   History  Substance Use Topics  . Smoking status: Current Some Day Smoker -- 0.50 packs/day    Types: Cigarettes  . Smokeless tobacco: Never Used  . Alcohol Use: No     Comment: Denies 06/12/2014   OB History    No data available     Review of Systems  Constitutional: Positive for fever,  chills and diaphoresis.  Respiratory: Negative for cough.   Genitourinary: Negative for dysuria, frequency and hematuria.  Musculoskeletal: Positive for back pain, arthralgias and neck pain.  Skin: Negative for rash.  Psychiatric/Behavioral: Negative for confusion.      Allergies  Review of patient's allergies indicates no known allergies.  Home Medications   Prior to Admission medications   Medication Sig Start Date End Date Taking? Authorizing Provider  oxyCODONE (OXY IR/ROXICODONE) 5 MG immediate release tablet Take 5 mg by mouth every 4 (four) hours as needed. pain   Yes Historical Provider, MD  hydrocortisone 1 % lotion Apply 1 application topically 2 (two) times daily. Patient not taking: Reported on 02/25/2015 12/01/14   Alvina Chou, PA-C  ondansetron (ZOFRAN ODT) 4 MG disintegrating tablet Take 1 tablet (4 mg total) by mouth every 8 (eight) hours as needed for nausea or vomiting. Patient not taking: Reported on 02/25/2015 12/01/14   Alvina Chou, PA-C  ondansetron (ZOFRAN) 4 MG tablet Take 1 tablet (4 mg total) by mouth every 6 (six) hours. Patient not taking: Reported on 12/01/2014 06/17/14   Ezequiel Essex, MD  oxyCODONE-acetaminophen (PERCOCET/ROXICET) 5-325 MG per tablet Take 2 tablets by mouth every 4 (four) hours as needed for severe pain. Patient not taking: Reported on 12/01/2014 06/17/14   Ezequiel Essex, MD   Triage Vitals: BP 111/63 mmHg  Pulse 98  Temp(Src) 100 F (37.8 C) (Oral)  Resp 16  Ht 5\' 4"  (1.626 m)  Wt 135 lb (61.236 kg)  BMI 23.16 kg/m2  SpO2 97%  LMP 03/06/2015   Physical Exam  Constitutional: She is oriented to person, place, and time. She appears well-developed and well-nourished. No distress.  HENT:  Head: Normocephalic and atraumatic.  Eyes: EOM are normal.  Neck: Normal range of motion.  Moderate tenderness diffusely     Cardiovascular: Normal rate, regular rhythm and normal heart sounds.   Pulmonary/Chest: Effort normal and breath  sounds normal.  Abdominal: Soft. She exhibits no distension. There is no tenderness.  Musculoskeletal: Normal range of motion. She exhibits tenderness. She exhibits no edema.  Tender across lower back and both knees No swelling  Neurological: She is alert and oriented to person, place, and time.  Skin: Skin is warm and dry.  Psychiatric: She has a normal mood and affect. Judgment normal.  Nursing note and vitals reviewed.   ED Course  Procedures (including critical care time)   DIAGNOSTIC STUDIES: Oxygen Saturation is 97% on RA, Normal by my interpretation.    COORDINATION OF CARE: 11:16 PM- Will order CBC, CMP, reticulocytes, urinalysis, urine pregnancy, and CXR. Will give fluids, Dilaudid, Benadyrl and Tylenol. Discussed treatment plan with pt at bedside and pt agreed to plan.     Labs Review Results for orders placed or performed during the hospital encounter of 03/08/15  CBC with Differential  Result Value Ref Range   WBC 17.0 (H) 4.0 - 10.5 K/uL   RBC 2.80 (L) 3.87 - 5.11 MIL/uL   Hemoglobin 10.0 (L) 12.0 - 15.0 g/dL   HCT 27.6 (L) 36.0 - 46.0 %   MCV 98.6 78.0 - 100.0 fL   MCH 35.7 (H) 26.0 - 34.0 pg   MCHC 36.2 (H) 30.0 - 36.0 g/dL   RDW 16.6 (H) 11.5 - 15.5 %   Platelets 563 (H) 150 - 400 K/uL   Neutrophils Relative % 82 (H) 43 - 77 %   Neutro Abs 13.9 (H) 1.7 - 7.7 K/uL   Lymphocytes Relative 8 (L) 12 - 46 %   Lymphs Abs 1.4 0.7 - 4.0 K/uL   Monocytes Relative 10 3 - 12 %   Monocytes Absolute 1.7 (H) 0.1 - 1.0 K/uL   Eosinophils Relative 0 0 - 5 %   Eosinophils Absolute 0.0 0.0 - 0.7 K/uL   Basophils Relative 0 0 - 1 %   Basophils Absolute 0.0 0.0 - 0.1 K/uL  Comprehensive metabolic panel  Result Value Ref Range   Sodium 134 (L) 135 - 145 mmol/L   Potassium 4.0 3.5 - 5.1 mmol/L   Chloride 101 101 - 111 mmol/L   CO2 22 22 - 32 mmol/L   Glucose, Bld 105 (H) 70 - 99 mg/dL   BUN 6 6 - 20 mg/dL   Creatinine, Ser 0.93 0.44 - 1.00 mg/dL   Calcium 9.4 8.9 - 10.3  mg/dL   Total Protein 8.3 (H) 6.5 - 8.1 g/dL   Albumin 4.5 3.5 - 5.0 g/dL   AST 25 15 - 41 U/L   ALT 17 14 - 54 U/L   Alkaline Phosphatase 54 38 - 126 U/L   Total Bilirubin 8.3 (H) 0.3 - 1.2 mg/dL   GFR calc non Af Amer >60 >60 mL/min   GFR calc Af Amer >60 >60 mL/min   Anion gap 11 5 - 15  Reticulocytes  Result Value Ref Range  Retic Ct Pct 13.6 (H) 0.4 - 3.1 %   RBC. 2.80 (L) 3.87 - 5.11 MIL/uL   Retic Count, Manual 380.8 (H) 19.0 - 186.0 K/uL  Urinalysis, Routine w reflex microscopic  Result Value Ref Range   Color, Urine AMBER (A) YELLOW   APPearance CLOUDY (A) CLEAR   Specific Gravity, Urine 1.012 1.005 - 1.030   pH 5.5 5.0 - 8.0   Glucose, UA NEGATIVE NEGATIVE mg/dL   Hgb urine dipstick NEGATIVE NEGATIVE   Bilirubin Urine NEGATIVE NEGATIVE   Ketones, ur NEGATIVE NEGATIVE mg/dL   Protein, ur NEGATIVE NEGATIVE mg/dL   Urobilinogen, UA 1.0 0.0 - 1.0 mg/dL   Nitrite NEGATIVE NEGATIVE   Leukocytes, UA NEGATIVE NEGATIVE  POC Urine Pregnancy, ED (do NOT order at [facility])  Result Value Ref Range   Preg Test, Ur NEGATIVE NEGATIVE   Imaging Review Dg Chest 2 View  03/08/2015   CLINICAL DATA:  Fever today.  History of asthma.  EXAM: CHEST  2 VIEW  COMPARISON:  09/01/2014  FINDINGS: Heart size is upper normal, unchanged. The lungs are clear. There are no effusions. Hilar and mediastinal contours are unremarkable and unchanged.  IMPRESSION: No active cardiopulmonary disease.   Electronically Signed   By: Andreas Newport M.D.   On: 03/08/2015 23:48     MDM   Final diagnoses:  Fever  Vasoocclusive sickle cell crisis    Patient with sickle cell disease and apparent vaso-occlusive crisis. Low-grade fever is also noted. She has no infectious symptoms other than the fever but will screen for occult infection. She is given IV fluids and IV narcotics. Old records are reviewed and she has multiple ED admissions and hospitalizations for similar presentations. ED workup showed no source of  infection. Chest x-ray and urinalysis were clear. She failed to respond to the above noted treatment so decision was made to admit. Case is discussed with family practice resident who agrees to admit the patient.  I personally performed the services described in this documentation, which was scribed in my presence. The recorded information has been reviewed and is accurate.      Delora Fuel, MD 29/51/88 [id number]

## 2015-03-09 ENCOUNTER — Encounter (HOSPITAL_COMMUNITY): Payer: Self-pay | Admitting: General Practice

## 2015-03-09 DIAGNOSIS — D5700 Hb-SS disease with crisis, unspecified: Principal | ICD-10-CM

## 2015-03-09 DIAGNOSIS — F17210 Nicotine dependence, cigarettes, uncomplicated: Secondary | ICD-10-CM | POA: Diagnosis present

## 2015-03-09 DIAGNOSIS — R45851 Suicidal ideations: Secondary | ICD-10-CM | POA: Diagnosis present

## 2015-03-09 DIAGNOSIS — L299 Pruritus, unspecified: Secondary | ICD-10-CM | POA: Diagnosis present

## 2015-03-09 DIAGNOSIS — F328 Other depressive episodes: Secondary | ICD-10-CM | POA: Diagnosis not present

## 2015-03-09 DIAGNOSIS — J4520 Mild intermittent asthma, uncomplicated: Secondary | ICD-10-CM | POA: Diagnosis present

## 2015-03-09 DIAGNOSIS — F329 Major depressive disorder, single episode, unspecified: Secondary | ICD-10-CM | POA: Diagnosis present

## 2015-03-09 DIAGNOSIS — K219 Gastro-esophageal reflux disease without esophagitis: Secondary | ICD-10-CM | POA: Diagnosis present

## 2015-03-09 DIAGNOSIS — Z9081 Acquired absence of spleen: Secondary | ICD-10-CM | POA: Diagnosis present

## 2015-03-09 LAB — CBC WITH DIFFERENTIAL/PLATELET
BASOS ABS: 0 10*3/uL (ref 0.0–0.1)
BASOS PCT: 0 % (ref 0–1)
EOS ABS: 0 10*3/uL (ref 0.0–0.7)
Eosinophils Relative: 0 % (ref 0–5)
HCT: 21.1 % — ABNORMAL LOW (ref 36.0–46.0)
Hemoglobin: 7.9 g/dL — ABNORMAL LOW (ref 12.0–15.0)
LYMPHS ABS: 1.8 10*3/uL (ref 0.7–4.0)
Lymphocytes Relative: 12 % (ref 12–46)
MCH: 36.2 pg — AB (ref 26.0–34.0)
MCHC: 37.4 g/dL — ABNORMAL HIGH (ref 30.0–36.0)
MCV: 96.8 fL (ref 78.0–100.0)
Monocytes Absolute: 1.5 10*3/uL — ABNORMAL HIGH (ref 0.1–1.0)
Monocytes Relative: 10 % (ref 3–12)
Neutro Abs: 11.7 10*3/uL — ABNORMAL HIGH (ref 1.7–7.7)
Neutrophils Relative %: 78 % — ABNORMAL HIGH (ref 43–77)
PLATELETS: 432 10*3/uL — AB (ref 150–400)
RBC: 2.18 MIL/uL — ABNORMAL LOW (ref 3.87–5.11)
RDW: 17.3 % — AB (ref 11.5–15.5)
WBC: 15 10*3/uL — ABNORMAL HIGH (ref 4.0–10.5)

## 2015-03-09 LAB — CBC
HCT: 20.7 % — ABNORMAL LOW (ref 36.0–46.0)
Hemoglobin: 7.5 g/dL — ABNORMAL LOW (ref 12.0–15.0)
MCH: 35.5 pg — ABNORMAL HIGH (ref 26.0–34.0)
MCHC: 36.2 g/dL — ABNORMAL HIGH (ref 30.0–36.0)
MCV: 98.1 fL (ref 78.0–100.0)
Platelets: 412 10*3/uL — ABNORMAL HIGH (ref 150–400)
RBC: 2.11 MIL/uL — AB (ref 3.87–5.11)
RDW: 18.1 % — ABNORMAL HIGH (ref 11.5–15.5)
WBC: 11 10*3/uL — ABNORMAL HIGH (ref 4.0–10.5)

## 2015-03-09 LAB — COMPREHENSIVE METABOLIC PANEL
ALK PHOS: 44 U/L (ref 38–126)
ALT: 18 U/L (ref 14–54)
AST: 29 U/L (ref 15–41)
Albumin: 3.7 g/dL (ref 3.5–5.0)
Anion gap: 7 (ref 5–15)
BILIRUBIN TOTAL: 9.7 mg/dL — AB (ref 0.3–1.2)
BUN: 7 mg/dL (ref 6–20)
CHLORIDE: 107 mmol/L (ref 101–111)
CO2: 21 mmol/L — AB (ref 22–32)
Calcium: 8.5 mg/dL — ABNORMAL LOW (ref 8.9–10.3)
Creatinine, Ser: 0.7 mg/dL (ref 0.44–1.00)
GFR calc Af Amer: >60 mL/min (ref >60)
GFR calc non Af Amer: >60 mL/min (ref >60)
Glucose, Bld: 88 mg/dL (ref 70–99)
Potassium: 4 mmol/L (ref 3.5–5.1)
Sodium: 135 mmol/L (ref 135–145)
Total Protein: 6.1 g/dL — ABNORMAL LOW (ref 6.5–8.1)

## 2015-03-09 LAB — RETICULOCYTES
RBC.: 2.18 MIL/uL — ABNORMAL LOW (ref 3.87–5.11)
RETIC CT PCT: 18 % — AB (ref 0.4–3.1)
Retic Count, Absolute: 392.4 10*3/uL — ABNORMAL HIGH (ref 19.0–186.0)

## 2015-03-09 LAB — LACTATE DEHYDROGENASE: LDH: 281 U/L — AB (ref 98–192)

## 2015-03-09 LAB — HIV ANTIBODY (ROUTINE TESTING W REFLEX): HIV Screen 4th Generation wRfx: NONREACTIVE

## 2015-03-09 LAB — TYPE AND SCREEN
ABO/RH(D): B POS
Antibody Screen: NEGATIVE

## 2015-03-09 LAB — MAGNESIUM: MAGNESIUM: 1.7 mg/dL (ref 1.7–2.4)

## 2015-03-09 MED ORDER — ACETAMINOPHEN 325 MG PO TABS: 650.0000 mg | ORAL_TABLET | ORAL | Status: DC | PRN

## 2015-03-09 MED ORDER — ZOLPIDEM TARTRATE 5 MG PO TABS: 5.0000 mg | ORAL_TABLET | Freq: Every evening | ORAL | Status: DC | PRN

## 2015-03-09 MED ORDER — HYDROMORPHONE HCL 1 MG/ML IJ SOLN: 0.5000 mg | INTRAMUSCULAR | Status: DC | PRN

## 2015-03-09 MED ORDER — POLYETHYLENE GLYCOL 3350 17 G PO PACK: 17.0000 g | PACK | Freq: Every day | ORAL | Status: DC | PRN

## 2015-03-09 MED ORDER — ONDANSETRON HCL 4 MG PO TABS: 4.0000 mg | ORAL_TABLET | ORAL | Status: DC | PRN

## 2015-03-09 MED ORDER — ACETAMINOPHEN 650 MG RE SUPP: 650.0000 mg | RECTAL | Status: DC | PRN

## 2015-03-09 MED ORDER — SENNOSIDES-DOCUSATE SODIUM 8.6-50 MG PO TABS
1.0000 | ORAL_TABLET | Freq: Two times a day (BID) | ORAL | Status: DC
  Administered 2015-03-09 (×2): 1 via ORAL
  Filled 2015-03-09 (×2): qty 1

## 2015-03-09 MED ORDER — HYDROMORPHONE HCL 1 MG/ML IJ SOLN
1.0000 mg | INTRAMUSCULAR | Status: DC | PRN
  Administered 2015-03-09 – 2015-03-10 (×2): 1 mg via INTRAVENOUS
  Filled 2015-03-09 (×2): qty 1

## 2015-03-09 MED ORDER — HYDROMORPHONE HCL 1 MG/ML IJ SOLN
2.0000 mg | Freq: Once | INTRAMUSCULAR | Status: AC
  Administered 2015-03-09: 2 mg via INTRAVENOUS
  Filled 2015-03-09: qty 2

## 2015-03-09 MED ORDER — NICOTINE 14 MG/24HR TD PT24
14.0000 mg | MEDICATED_PATCH | Freq: Every day | TRANSDERMAL | Status: DC
  Administered 2015-03-09: 14 mg via TRANSDERMAL
  Filled 2015-03-09: qty 1

## 2015-03-09 MED ORDER — PANTOPRAZOLE SODIUM 40 MG IV SOLR: 40.0000 mg | Freq: Every day | INTRAVENOUS | Status: DC

## 2015-03-09 MED ORDER — MORPHINE SULFATE ER 15 MG PO TBCR
15.0000 mg | EXTENDED_RELEASE_TABLET | Freq: Two times a day (BID) | ORAL | Status: DC
Start: 2015-03-09 — End: 2015-03-10
  Administered 2015-03-09 – 2015-03-10 (×3): 15 mg via ORAL
  Filled 2015-03-09 (×3): qty 1

## 2015-03-09 MED ORDER — ONDANSETRON HCL 4 MG/2ML IJ SOLN: 4.0000 mg | INTRAMUSCULAR | Status: DC | PRN

## 2015-03-09 MED ORDER — MORPHINE SULFATE 15 MG PO TABS: 15.0000 mg | ORAL_TABLET | ORAL | Status: DC | PRN

## 2015-03-09 MED ORDER — MORPHINE SULFATE 15 MG PO TABS
15.0000 mg | ORAL_TABLET | ORAL | Status: DC | PRN
  Administered 2015-03-09 (×2): 15 mg via ORAL
  Filled 2015-03-09 (×3): qty 1

## 2015-03-09 MED ORDER — HEPARIN SODIUM (PORCINE) 5000 UNIT/ML IJ SOLN
5000.0000 [IU] | Freq: Three times a day (TID) | INTRAMUSCULAR | Status: DC
  Administered 2015-03-09 – 2015-03-10 (×4): 5000 [IU] via SUBCUTANEOUS
  Filled 2015-03-09 (×4): qty 1

## 2015-03-09 MED ORDER — KETOROLAC TROMETHAMINE 15 MG/ML IJ SOLN
15.0000 mg | Freq: Four times a day (QID) | INTRAMUSCULAR | Status: DC
  Administered 2015-03-09 – 2015-03-10 (×5): 15 mg via INTRAVENOUS
  Filled 2015-03-09 (×5): qty 1

## 2015-03-09 MED ORDER — MORPHINE SULFATE 15 MG PO TABS
15.0000 mg | ORAL_TABLET | ORAL | Status: DC | PRN
  Administered 2015-03-09: 15 mg via ORAL
  Filled 2015-03-09: qty 1

## 2015-03-09 MED ORDER — PANTOPRAZOLE SODIUM 40 MG PO TBEC
40.0000 mg | DELAYED_RELEASE_TABLET | Freq: Every day | ORAL | Status: DC
  Administered 2015-03-09: 40 mg via ORAL
  Filled 2015-03-09: qty 1

## 2015-03-09 MED ORDER — FOLIC ACID 1 MG PO TABS
1.0000 mg | ORAL_TABLET | Freq: Every day | ORAL | Status: DC
Start: 2015-03-09 — End: 2015-03-10
  Administered 2015-03-09: 1 mg via ORAL
  Filled 2015-03-09: qty 1

## 2015-03-09 MED ORDER — HYDROXYZINE HCL 25 MG PO TABS
25.0000 mg | ORAL_TABLET | ORAL | Status: DC | PRN
  Administered 2015-03-09: 25 mg via ORAL
  Filled 2015-03-09: qty 1

## 2015-03-09 NOTE — Progress Notes (Signed)
Patient arrived to 4N05 AAOx4 with pain 9/10 in back, knees, and neck. Pt oriented to the room, vitals taken, and call bell placed. Will continue to monitor. Delano Scardino, Rande Brunt, RN

## 2015-03-09 NOTE — H&P (Signed)
[facility] Admission History and Physical Service Pager: [phone]  Patient name: [name] Medical record number: [id number] Date of birth: [birth year] Age: 19 y.o. Gender: female  Primary Care Provider: Dimas Chyle, MD Consultants: None Code Status:   Chief Complaint: Sickle Cell Pain   Assessment and Plan: [name] is a 19 y.o. female presenting with sickle cell pain crisis. 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 Pt. Here with pain that has been worsening over the past 24 hours. She says that she has been using her Aunt's pain medication "roxy's" to control her pain due to being out of pain medication. Pain is in her back and stomach, and is her typical sickle cell pain. Reports feeling warm at home, no fevers here. No complaints consistent with acute chest syndrome. CXR clear and unchanged. WBC elevated to 17, though may be somewhat dry and may represent a reactive leukocytosis. Retic count 13.6. U/A without evidence of infection. Upreg negative. Elevated bilirubin to 8.3 stable since last D/C. Baseline Hgb 9. Hemoglobin here 10. Platelets 563.  - Admit to med-surg under Dr. Erin Hearing - Monitor vitals / fever curve. If worsening would have low threshold to start antibiotics.  - Trend hemoglobin and retic.  - LDH pending, will get direct / indirect bili.  - MS Contin 15mg  q12 hr. Po, MSIR 15 mg q4 hrs. prn Po.  - Atarax for itching - Toradol 15mg  q 6 hrs. Day 1/5 - Tylenol prn fevers.  - MIVF for now.  - Urine Drug Screen.   # Sickle Cell Anemia - Pt. With Hgb SS. No hematologist that she knows of. Poor follow up with PCP. Has been getting pain medicine from her aunt. Baseline Hgb 9. Hgb is 10 here. Platelets > 500. However, bilirubin is elevated to 8.3.  - Trend Hgb and Retics.  - Has not been on hydroxyurea.  - Ensure hydration.  -  Consider referral to hematology once improving and ready for d/c.  - Checking fractionated bili  # Leukocytosis - likely a reactive leukocytosis. Pt. Is afebrile here, though she does complain of yellow productive cough. CXR negative so far. Urine without evidence of infection. No other obvious source. Pt. May be somewhat dehydrated as well.  - Continue to follow fever curve - Trend WBCs - Low threshold for repeat CXR  # Asthma - Mild Intermittent  - No symptoms at this time, good air movement.  - Albuterol prn.   # Tobacco - Smoking 1/2 ppd - Nicotine replacement with patch.  - Cessation counseling prior to d/c.   # Misuse of Prescribed Medications - Pt. Has been taking "aunt's roxy's" according to her. She denies using other substances at this time. Has had very poor follow up with PCP and has been admitted to the hospital twice since 08/2014 for pain related to sickle cell disease. Concern given use of prescription medications not prescribed to her.  - UDS with alcohol - Will discuss the implications of this further with the patient and encourage close follow up at d/c.   FEN/GI: Regular diet / MIVF for now. PPI  Prophylaxis: Heparin Sub q.   Disposition: Pending pain control and improvement.   History of Present Illness: [name] is a 19 y.o. female presenting with Pain in her lumbar spine and abdomen as well as her knees that has been worsening over the past 24 hours  despite using pain medicine at home that she got from her aunt. She says she has felt warm to the touch and had a temp of 100.1 earlier today. She has not had any sick contacts, chest pain, or shortness of breath. No diarrhea, or nausea / vomiting. She has had a cough with production of yellow sputum, but she says this has been going on for years due to her smoking and is unchanged currently. She does endorse loss of appetite since symptoms began. he denies headache, vision changes, numbness, tingling, or  weakness. She says that she came in because of the pain and that dilaudid is what usually helps her pain. She says that she has never had a hematologist to follow up with that she knows of, and that she used to see [facility] Yong [facility] at [facility].   Review Of Systems: Per HPI with the following additions: None Otherwise 12 point review of systems was performed and was unremarkable.  Patient Active Problem List   Diagnosis Date Noted  . Hyperbilirubinemia 02/26/2015  . Drug-induced pruritus 02/26/2015  . Sickle cell anemia with crisis 02/25/2015  . Tobacco abuse   . Sickle cell crisis   . Leukocytosis   . Hematochezia 02/27/2014  . UTI (lower urinary tract infection) 02/26/2014  . Vaginal discharge 02/24/2014  . Sickle cell pain crisis 11/23/2013  . Facial rash 03/25/2013  . Birth control counseling 08/14/2012  . Asthma, mild intermittent 12/14/2010  . 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 Asthma   . Sickle cell disease   . Amenorrhea   . Hyperbilirubinemia 02/26/2015  . Drug-induced pruritus 02/26/2015   Past Surgical History: Past Surgical History  Procedure Laterality Date  . Splenectomy      Age 61 for sequestration  . Tonsillectomy      Age 63   Social History: History  Substance Use Topics  . Smoking status: Current Some Day Smoker -- 0.50 packs/day    Types: Cigarettes  . Smokeless tobacco: Never Used  . Alcohol Use: No     Comment: Denies 06/12/2014   Additional social history: None  Please also refer to relevant sections of EMR.  Family History: Family History  Problem Relation Age of Onset  . Hypertension Paternal Grandfather   . Sickle cell trait Father   . Cancer Mother     Died in 2009-02-03   Allergies and Medications: No Known Allergies No current facility-administered medications on file prior to encounter.   Current Outpatient Prescriptions on File Prior to Encounter  Medication Sig  Dispense Refill  . oxyCODONE (OXY IR/ROXICODONE) 5 MG immediate release tablet Take 5 mg by mouth every 4 (four) hours as needed. pain    . hydrocortisone 1 % lotion Apply 1 application topically 2 (two) times daily. (Patient not taking: Reported on 02/25/2015) 118 mL 0  . ondansetron (ZOFRAN ODT) 4 MG disintegrating tablet Take 1 tablet (4 mg total) by mouth every 8 (eight) hours as needed for nausea or vomiting. (Patient not taking: Reported on 02/25/2015) 10 tablet 0  . ondansetron (ZOFRAN) 4 MG tablet Take 1 tablet (4 mg total) by mouth every 6 (six) hours. (Patient not taking: Reported on 12/01/2014) 12 tablet 0  . oxyCODONE-acetaminophen (PERCOCET/ROXICET) 5-325 MG per tablet Take 2 tablets by mouth every 4 (four) hours as needed for severe pain. (Patient not taking: Reported on 12/01/2014) 15 tablet 0    Objective: BP 104/57 mmHg  Pulse  78  Temp(Src) 100 F (37.8 C) (Oral)  Resp 17  Ht 5\' 4"  (1.626 m)  Wt 135 lb (61.236 kg)  BMI 23.16 kg/m2  SpO2 89%  LMP 03/06/2015 Exam: General: NAD, AAOx3 HEENT: NCAT, PERRLA, EOMI, O/P Clear, No LAD, FROM, Supple, Icterus  Cardiovascular: Systolic ejection murmur, No gallops or rubs, regular rate and rhythm.  Respiratory: CTA Bilaterally without crackles, rales, wheezes. Appropriate rate, unlabored. No chest TTP.  Abdomen: S, NT, ND, +BS, no organomegaly.  Extremities: WWP, 2+ distal pulses. TTP over lumbar spine, little if any TTP over arms / legs including her knees.  Skin: No rashes, no lesions.  Neuro: AAOx3, CN II-XII in tact grossly, no motor or sensory deficits. Grossly intact overall.   Labs and Imaging: CBC BMET   Recent Labs Lab 03/08/15 2011  WBC 17.0*  HGB 10.0*  HCT 27.6*  PLT 563*    Recent Labs Lab 03/08/15 2011  NA 134*  K 4.0  CL 101  CO2 22  BUN 6  CREATININE 0.93  GLUCOSE 105*  CALCIUM 9.4     CMP Latest Ref Rng 03/08/2015 02/26/2015 02/24/2015  Glucose 70 - 99 mg/dL 105(H) 77 93  BUN 6 - 20 mg/dL 6 7 8    Creatinine 0.44 - 1.00 mg/dL 0.93 0.52 0.77  Sodium 135 - 145 mmol/L 134(L) 135 137  Potassium 3.5 - 5.1 mmol/L 4.0 3.8 4.2  Chloride 101 - 111 mmol/L 101 105 105  CO2 22 - 32 mmol/L 22 23 26   Calcium 8.9 - 10.3 mg/dL 9.4 8.7 9.0  Total Protein 6.5 - 8.1 g/dL 8.3(H) 6.4 -  Total Bilirubin 0.3 - 1.2 mg/dL 8.3(H) 8.3(H) -  Alkaline Phos 38 - 126 U/L 54 46 -  AST 15 - 41 U/L 25 21 -  ALT 14 - 54 U/L 17 13 -   Urinalysis    Component Value Date/Time   COLORURINE AMBER* 03/08/2015 1036   APPEARANCEUR CLOUDY* 03/08/2015 1036   LABSPEC 1.012 03/08/2015 1036   PHURINE 5.5 03/08/2015 1036   GLUCOSEU NEGATIVE 03/08/2015 1036   HGBUR NEGATIVE 03/08/2015 1036   BILIRUBINUR NEGATIVE 03/08/2015 1036   KETONESUR NEGATIVE 03/08/2015 1036   PROTEINUR NEGATIVE 03/08/2015 1036   UROBILINOGEN 1.0 03/08/2015 1036   NITRITE NEGATIVE 03/08/2015 1036   LEUKOCYTESUR NEGATIVE 03/08/2015 1036   Urine Pregnancy Test - Negative.   Reticulocytes - 13.6%, Manual - 380.8  CXR -  FINDINGS: Heart size is upper normal, unchanged. The lungs are clear. There are no effusions. Hilar and mediastinal contours are unremarkable and unchanged.  IMPRESSION: No active cardiopulmonary disease.   Aquilla Hacker, MD 03/09/2015, 4:33 AM PGY-1, [place] Intern pager: [phone], text pages welcome  I have seen and examined the patient with Dr. Minda Ditto and I agree with his documentation above. My annotations are in blue.   Laroy Apple, MD [place] Resident, PGY-3 03/09/2015, 6:45 AM
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
## 2015-03-10 ENCOUNTER — Inpatient Hospital Stay (HOSPITAL_COMMUNITY)
Admission: EM | Admit: 2015-03-10 | Discharge: 2015-03-11 | Disposition: A | Discharge status: Home / Self Care | Payer: Medicare Other | Source: Home / Self Care | Attending: Family Medicine | Admitting: Family Medicine

## 2015-03-10 ENCOUNTER — Encounter (HOSPITAL_COMMUNITY): Payer: Self-pay | Admitting: Emergency Medicine

## 2015-03-10 DIAGNOSIS — F329 Major depressive disorder, single episode, unspecified: Secondary | ICD-10-CM | POA: Diagnosis present

## 2015-03-10 DIAGNOSIS — D5700 Hb-SS disease with crisis, unspecified: Principal | ICD-10-CM | POA: Diagnosis present

## 2015-03-10 DIAGNOSIS — J4520 Mild intermittent asthma, uncomplicated: Secondary | ICD-10-CM | POA: Diagnosis present

## 2015-03-10 DIAGNOSIS — Z9081 Acquired absence of spleen: Secondary | ICD-10-CM | POA: Diagnosis present

## 2015-03-10 DIAGNOSIS — K219 Gastro-esophageal reflux disease without esophagitis: Secondary | ICD-10-CM | POA: Diagnosis present

## 2015-03-10 DIAGNOSIS — L299 Pruritus, unspecified: Secondary | ICD-10-CM | POA: Diagnosis present

## 2015-03-10 DIAGNOSIS — R45851 Suicidal ideations: Secondary | ICD-10-CM | POA: Diagnosis present

## 2015-03-10 DIAGNOSIS — F17210 Nicotine dependence, cigarettes, uncomplicated: Secondary | ICD-10-CM | POA: Diagnosis present

## 2015-03-10 DIAGNOSIS — F0631 Mood disorder due to known physiological condition with depressive features: Secondary | ICD-10-CM | POA: Diagnosis present

## 2015-03-10 LAB — CBC WITH DIFFERENTIAL/PLATELET
Basophils Absolute: 0 10*3/uL (ref 0.0–0.1)
Basophils Relative: 0 % (ref 0–1)
EOS ABS: 0.1 10*3/uL (ref 0.0–0.7)
Eosinophils Relative: 1 % (ref 0–5)
HEMATOCRIT: 19.6 % — AB (ref 36.0–46.0)
HEMOGLOBIN: 7.1 g/dL — AB (ref 12.0–15.0)
Lymphocytes Relative: 40 % (ref 12–46)
Lymphs Abs: 3.2 10*3/uL (ref 0.7–4.0)
MCH: 35.3 pg — AB (ref 26.0–34.0)
MCHC: 36.2 g/dL — ABNORMAL HIGH (ref 30.0–36.0)
MCV: 97.5 fL (ref 78.0–100.0)
MONOS PCT: 15 % — AB (ref 3–12)
Monocytes Absolute: 1.2 10*3/uL — ABNORMAL HIGH (ref 0.1–1.0)
Neutro Abs: 3.6 10*3/uL (ref 1.7–7.7)
Neutrophils Relative %: 44 % (ref 43–77)
Platelets: 446 10*3/uL — ABNORMAL HIGH (ref 150–400)
RBC: 2.01 MIL/uL — ABNORMAL LOW (ref 3.87–5.11)
RDW: 18.9 % — ABNORMAL HIGH (ref 11.5–15.5)
WBC: 8.1 10*3/uL (ref 4.0–10.5)

## 2015-03-10 LAB — CBC
HCT: 18.4 % — ABNORMAL LOW (ref 36.0–46.0)
Hemoglobin: 6.8 g/dL — CL (ref 12.0–15.0)
MCH: 35.1 pg — AB (ref 26.0–34.0)
MCHC: 37 g/dL — AB (ref 30.0–36.0)
MCV: 94.8 fL (ref 78.0–100.0)
PLATELETS: 380 10*3/uL (ref 150–400)
RBC: 1.94 MIL/uL — ABNORMAL LOW (ref 3.87–5.11)
RDW: 18.5 % — AB (ref 11.5–15.5)
WBC: 8.5 10*3/uL (ref 4.0–10.5)

## 2015-03-10 LAB — RAPID URINE DRUG SCREEN, HOSP PERFORMED
Amphetamines: NOT DETECTED
Barbiturates: NOT DETECTED
Benzodiazepines: NOT DETECTED
COCAINE: NOT DETECTED
Opiates: POSITIVE — AB
TETRAHYDROCANNABINOL: POSITIVE — AB

## 2015-03-10 LAB — COMPREHENSIVE METABOLIC PANEL
ALT: 24 U/L (ref 14–54)
AST: 36 U/L (ref 15–41)
Albumin: 3.8 g/dL (ref 3.5–5.0)
Alkaline Phosphatase: 46 U/L (ref 38–126)
Anion gap: 8 (ref 5–15)
BILIRUBIN TOTAL: 7.9 mg/dL — AB (ref 0.3–1.2)
BUN: 7 mg/dL (ref 6–20)
CHLORIDE: 108 mmol/L (ref 101–111)
CO2: 22 mmol/L (ref 22–32)
Calcium: 8.5 mg/dL — ABNORMAL LOW (ref 8.9–10.3)
Creatinine, Ser: 0.63 mg/dL (ref 0.44–1.00)
GFR calc Af Amer: >60 mL/min (ref >60)
GFR calc non Af Amer: >60 mL/min (ref >60)
Glucose, Bld: 96 mg/dL (ref 70–99)
POTASSIUM: 4.1 mmol/L (ref 3.5–5.1)
Sodium: 138 mmol/L (ref 135–145)
Total Protein: 6.5 g/dL (ref 6.5–8.1)

## 2015-03-10 LAB — RETICULOCYTES
RBC.: 2.03 MIL/uL — AB (ref 3.87–5.11)
RETIC COUNT ABSOLUTE: 420.2 10*3/uL — AB (ref 19.0–186.0)
Retic Ct Pct: 20.7 % — ABNORMAL HIGH (ref 0.4–3.1)

## 2015-03-10 LAB — I-STAT BETA HCG BLOOD, ED (MC, WL, AP ONLY): I-stat hCG, quantitative: <5 m[IU]/mL (ref <5)

## 2015-03-10 MED ORDER — SENNOSIDES-DOCUSATE SODIUM 8.6-50 MG PO TABS: 1.0000 | ORAL_TABLET | Freq: Every evening | ORAL | Status: DC | PRN

## 2015-03-10 MED ORDER — HEPARIN SODIUM (PORCINE) 5000 UNIT/ML IJ SOLN
5000.0000 [IU] | Freq: Three times a day (TID) | INTRAMUSCULAR | Status: DC
  Administered 2015-03-10: 5000 [IU] via SUBCUTANEOUS
  Filled 2015-03-10 (×3): qty 1

## 2015-03-10 MED ORDER — HYDROMORPHONE HCL 1 MG/ML IJ SOLN
1.0000 mg | Freq: Once | INTRAMUSCULAR | Status: AC
  Administered 2015-03-10: 1 mg via INTRAVENOUS

## 2015-03-10 MED ORDER — HYDROMORPHONE HCL 1 MG/ML IJ SOLN
1.0000 mg | Freq: Once | INTRAMUSCULAR | Status: AC
  Administered 2015-03-10: 1 mg via INTRAVENOUS
  Filled 2015-03-10: qty 1

## 2015-03-10 MED ORDER — HYDROMORPHONE HCL 1 MG/ML IJ SOLN
1.0000 mg | INTRAMUSCULAR | Status: DC | PRN
  Administered 2015-03-10: 1 mg via INTRAVENOUS
  Filled 2015-03-10: qty 1

## 2015-03-10 MED ORDER — HYDROMORPHONE HCL 1 MG/ML IJ SOLN: 0.5000 mg | INTRAMUSCULAR | Status: DC | PRN

## 2015-03-10 MED ORDER — ONDANSETRON HCL 4 MG/2ML IJ SOLN: 4.0000 mg | Freq: Four times a day (QID) | INTRAMUSCULAR | Status: DC | PRN

## 2015-03-10 MED ORDER — SODIUM CHLORIDE 0.9 % IJ SOLN: 3.0000 mL | Freq: Two times a day (BID) | INTRAMUSCULAR | Status: DC

## 2015-03-10 MED ORDER — OXYCODONE-ACETAMINOPHEN 5-325 MG PO TABS
1.0000 | ORAL_TABLET | ORAL | Status: DC | PRN
  Administered 2015-03-10 – 2015-03-11 (×2): 2 via ORAL
  Filled 2015-03-10 (×2): qty 2

## 2015-03-10 MED ORDER — FOLIC ACID 1 MG PO TABS
1.0000 mg | ORAL_TABLET | Freq: Every day | ORAL | Status: DC
  Filled 2015-03-10 (×2): qty 1

## 2015-03-10 MED ORDER — SODIUM CHLORIDE 0.9 % IV SOLN: 250.0000 mL | INTRAVENOUS | Status: DC | PRN

## 2015-03-10 MED ORDER — SODIUM CHLORIDE 0.9 % IJ SOLN: 3.0000 mL | INTRAMUSCULAR | Status: DC | PRN

## 2015-03-10 MED ORDER — MORPHINE SULFATE ER 15 MG PO TBCR
15.0000 mg | EXTENDED_RELEASE_TABLET | Freq: Two times a day (BID) | ORAL | Status: DC
  Administered 2015-03-10 – 2015-03-11 (×2): 15 mg via ORAL
  Filled 2015-03-10 (×3): qty 1

## 2015-03-10 MED ORDER — HYDROMORPHONE HCL 2 MG PO TABS
1.0000 mg | ORAL_TABLET | ORAL | Status: DC | PRN
  Administered 2015-03-10 (×3): 1 mg via ORAL
  Filled 2015-03-10 (×3): qty 1

## 2015-03-10 MED ORDER — NICOTINE 14 MG/24HR TD PT24
14.0000 mg | MEDICATED_PATCH | Freq: Every day | TRANSDERMAL | Status: DC
  Administered 2015-03-10: 14 mg via TRANSDERMAL
  Filled 2015-03-10 (×2): qty 1

## 2015-03-10 MED ORDER — ONDANSETRON HCL 4 MG PO TABS: 4.0000 mg | ORAL_TABLET | Freq: Four times a day (QID) | ORAL | Status: DC | PRN

## 2015-03-10 NOTE — Progress Notes (Signed)
Interim Progress Note:   S: Called to room by nurse as patient refusing to comply with suicide precautions.  Upon entering the room, patient was unclothed, wearing only panties.  She was pacing and cussing, asking to leave immediately AMA, requiring security officers and police to prevent her from leaving the room.  Attempted to discuss reasons for suicide precautions to maintain her safety; however, she refused to engage in conversation continuing to cost and asked me to leave.  When informed her that all this was necessary protocols due to her suicidal ideation and endorsement of plan to slit her wrist. She replied, "I just said that because you guys are making me crazy.  She continued to pace around the room without clothes, and yelling for the police officers to come handcuffed her and take her to jail.   After leaving the room and giving patient 10 minutes to calm down, I again attempted to discuss with her why suicide precautions were necessary for her safety and that she would be evaluated by a psychiatrist, which I had already consulted.  She demanded that they come right now, and again got angry when I informed her that she had to remain on precautions until their evaluation.     O:  BP 106/47 mmHg  Pulse 67  Temp(Src) 98.5 F (36.9 C) (Oral)  Resp 20  Ht 5\' 4"  (1.626 m)  Wt 144 lb 10 oz (65.6 kg)  BMI 24.81 kg/m2  SpO2 100%  LMP 03/06/2015 General: Naked, pacing the room.   A/P  Suicidal ideation and agitation - Due to her previous suicidal ideation and plan, which she reports was due to her uncontrolled pain (which she reports is still uncontrolled) will continue with suicide precautions until psychological evaluation.  Completed IVC paperwork as she continues to attempt to leave AMA without allowing me to reassess her suicidal ideation or intent.  At this time she continues to report that her pain is uncontrolled, which was the motive for her suicidal ideation and as such I feel  would be unsafe to allow her to leave AMA.   [facility] PGY-2 Resident

## 2015-03-10 NOTE — Progress Notes (Signed)
CSW faxed IVC paperwork to Magistrate.  Receipt of paperwork confirmed with Magistrate via phone.  Pt will be served by [employer] this pm.  Unit/Psych CSW to f/u in am.

## 2015-03-10 NOTE — ED Notes (Signed)
Pt presents to ED via EMS. Pt was in [facility] ED at 630pm yesterday for sickle cell crisis. Left AMA this morning around 0900 because had to go take care of dog. Pt then called EMS again for exact same reason

## 2015-03-10 NOTE — Progress Notes (Signed)
Pt admitted from [facility] ED after signing out [name] this morning from [place]. A/O X4, complains of pain a 10 out of 10, IV in RAC saline locked, skin intact, vitals stable. Notified Family Medicine teaching service at 1:26pm of patients arrival to unit. Refused safety video. Will follow orders as placed.   [name]

## 2015-03-10 NOTE — Discharge Summary (Signed)
[facility] Discharge Summary  Patient name: [name] Medical record number: [id number] Date of birth: 21-Sep-1996 Age: 19 y.o. Gender: female Date of Admission: 03/08/2015  Date of Discharge: 03/10/2015 Admitting Physician: Lind Covert, MD  Primary Care Provider: Dimas Chyle, MD Consultants: None  Indication for Hospitalization: Sickle Cell Pain Crisis  Discharge Diagnoses/Problem List:  Sickle cell anemia, Asthma  Disposition: Left AMA  Discharge Condition: Unstable, left AMA  Discharge Exam:  T 98.1, P 72, RR 18, BP 100/50 Gen: Young female sitting on hospital bed, tearful HEENT: NCAT, moist mucus membranes Resp: Normal work of breathing Skin: No apparent rashes Neuro: Moves all extremities spontaneously, no focal deficits  Brief Hospital Course:  Patient was admitted with worsening pain for 24 hours prior to admission in her back and stomach which is her typical sickle cell pain. She had no fevers or respiratory symptoms. CXR was clear. Her hemoglobin was 10 on admission and patient had a good reticulocyte response. She did have a leukocytosis to 17 that was thought to be reactive. We started the patient on a pain regimen of toradol, tylenol, MS contin and MSIR. On hospital day 1, her hemoglobin dropped to 6.8, however she had no symptoms. That morning, she reported that she needed to "take my dogs to my grandmothers house" and that there was no one that would be able to do this for her. She subsequently left AMA, despite understanding the risks of leaving. Patient stated that she would return to the ED after taking care of her dogs.   Issues for Follow Up:  1. Will need to establish with hematology if possible  Significant Procedures: None  Significant Labs and Imaging:   Recent Labs Lab 03/09/15 0845 03/09/15 2000 03/10/15 0415  WBC 15.0* 11.0* 8.5  HGB 7.9* 7.5* 6.8*  HCT 21.1* 20.7* 18.4*  PLT 432* 412* 380     Recent Labs Lab 03/08/15 2011 03/09/15 0845 03/10/15 1112  NA 134* 135 138  K 4.0 4.0 4.1  CL 101 107 108  CO2 22 21* 22  GLUCOSE 105* 88 96  BUN 6 7 7   CREATININE 0.93 0.70 0.63  CALCIUM 9.4 8.5* 8.5*  MG  --  1.7  --   ALKPHOS 54 44 46  AST 25 29 36  ALT 17 18 24   ALBUMIN 4.5 3.7 3.8   Retics 18% HIV negative  CXR CLINICAL DATA: Fever today. History of asthma.  EXAM: CHEST 2 VIEW  COMPARISON: 09/01/2014  FINDINGS: Heart size is upper normal, unchanged. The lungs are clear. There are no effusions. Hilar and mediastinal contours are unremarkable and unchanged.  IMPRESSION: No active cardiopulmonary disease.   Results/Tests Pending at Time of Discharge: None  Discharge Medications:    Medication List    ASK your doctor about these medications        hydrocortisone 1 % lotion  Apply 1 application topically 2 (two) times daily.     ondansetron 4 MG disintegrating tablet  Commonly known as:  ZOFRAN ODT  Take 1 tablet (4 mg total) by mouth every 8 (eight) hours as needed for nausea or vomiting.     ondansetron 4 MG tablet  Commonly known as:  ZOFRAN  Take 1 tablet (4 mg total) by mouth every 6 (six) hours.     oxyCODONE 5 MG immediate release tablet  Commonly known as:  Oxy IR/ROXICODONE  Take 5 mg by mouth every 4 (four) hours as needed. pain  oxyCODONE-acetaminophen 5-325 MG per tablet  Commonly known as:  PERCOCET/ROXICET  Take 2 tablets by mouth every 4 (four) hours as needed for severe pain.        Discharge Instructions: Please refer to Patient Instructions section of EMR for full details.  Patient was counseled important signs and symptoms that should prompt return to medical care, changes in medications, dietary instructions, activity restrictions, and follow up appointments.   Follow-Up Appointments: Left AMA   Vivi Barrack, MD 03/10/2015, 12:27 PM PGY-1, [place]

## 2015-03-10 NOTE — Progress Notes (Signed)
Pt stated that she had to leave immediately and needed her IV pulled. Pt stated that her belongings were being thrown out of her hotel room and her dog was going to be taken to the pound. Pt stated that she had no one else who could take care of these things. I notified MD and he arrived to see her. Pt educated on the risks of leaving with her hemoglobin at 6.8. Pt stated that she understood and would be coming back to the ED later today. I asked if there was anything we could do to help her stay and she stated that she "had to take care of her stuff." IV pulled. Pt taken downstairs by staff via wheelchair.

## 2015-03-10 NOTE — Progress Notes (Signed)
Chaplain responded to page for pt in distress. This chaplain visited with pt yesterday and built up a good rapport. After a few minutes of talking pt nurse entered and pt began requesting pain medicine. Pt nurse explained pain medication protocol and offered to call the MD for pt. Pt very upset stating "Nothing that y'all are doing is working." From speaking with pt yesterday and today pt claims to have no support system. Chaplain believes that much of pt distress is due to social issues. Chaplain recommends social work consult. Pt does not want chaplain services at this time. Pt told chaplain she was going to go outside and smoke, and chaplain reported this to medical team.    03/10/15 1600  Clinical Encounter Type  Visited With Patient;Health care provider  Visit Type Follow-up;Spiritual support  Referral From Nurse  Spiritual Encounters  Spiritual Needs Emotional  Stress Factors  Patient Stress Factors Other (Comment) (Pain)  Kenaz Olafson, Barbette Hair, Chaplain 03/10/2015 4:28 PM
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
## 2015-03-10 NOTE — ED Notes (Signed)
Bed: WA03 Expected date:  Expected time:  Means of arrival:  Comments: Ems- ssc

## 2015-03-10 NOTE — H&P (Signed)
[facility] Admission History and Physical Service Pager: [phone]  Patient name: [name] Medical record number: [id number] Date of birth: 1995/11/26 Age: 19 y.o. Gender: female  Primary Care Provider: Dimas Chyle, MD Consultants: None Code Status:   Chief Complaint: Sickle Cell Pain   Assessment and Plan: [name] is a 19 y.o. female presenting with sickle cell pain crisis. 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 Pt. Returns after leaving AMA to go take care of her dog. She says had been using her Aunt's pain medication "roxy's" to control her pain due to being out of pain medication. Pain is in her back and leg, and is her typical sickle cell pain. No fevers here. No complaints consistent with acute chest syndrome. CXR clear and unchanged. Retic elevated. Hgb ~ 7 on admit (Baseline Hgb 9). Marland Kitchen U/A without evidence of infection. Upreg negative. Elevated bilirubin - Admit to med-surg  - Monitor vitals / fever curve. If worsening would have low threshold to start antibiotics.  - Trend hemoglobin and retic.  - MS Contin 15mg  q12 hr. PO, Percocet q4 hrs prn PO  - SLIV as taking good PO - Urine Drug Screen.   # Sickle Cell Anemia - Pt. With Hgb SS. No hematologist.  Poor follow up with PCP. Has been getting pain medicine from her aunt. Baseline Hgb 9. Platelets > 500. However, bilirubin is elevated to 8.3.  - Trend Hgb and Retics.  - Has not been on hydroxyurea; Considering  - Consider referral to hematology once improving and ready for d/c.  - Checking fractionated bili  # Asthma - Mild Intermittent  - No symptoms at this time, good air movement.    # Tobacco - Smoking 1/2 ppd - Nicotine replacement with patch.  - Cessation counseling prior to d/c.   # Misuse of Prescribed Medications - Pt. Has been taking "aunt's roxy's" according to her.  She denies using other substances at this time. Has had very poor follow up with PCP and has been admitted to the hospital twice since 08/2014 for pain related to sickle cell disease. Concern given use of prescription medications not prescribed to her.  - UDS  - Will discuss the implications of this further with the patient and encourage close follow up at d/c.   FEN/GI: Regular diet; SLIV. Prophylaxis: Heparin Sub q.   Disposition: Pending pain control and improvement.   History of Present Illness: [name] is a 19 y.o. female presenting with Pain in her lumbar spine and her knees that has been present for 2-3 days hours despite using pain medicine at home that she got from her aunt. She left AMA to go take care of her dog, and return with the same pain. She has not had any sick contacts, chest pain, or shortness of breath. No diarrhea, or nausea / vomiting. She has had a cough with production of yellow sputum, but she says this has been going on for years due to her smoking and is unchanged currently. She denies headache, vision changes, numbness, tingling, or weakness.    Review Of Systems: Per HPI with the following additions: None Otherwise 12 point review of systems was performed and was unremarkable.  Patient Active Problem List   Diagnosis Date Noted  . Sickle cell anemia with pain 03/10/2015  . Vasoocclusive sickle cell crisis   . Hyperbilirubinemia 02/26/2015  . Drug-induced  pruritus 02/26/2015  . Sickle cell anemia with crisis 02/25/2015  . Tobacco abuse   . Sickle cell crisis   . Leukocytosis   . Hematochezia 02/27/2014  . UTI (lower urinary tract infection) 02/26/2014  . Vaginal discharge 02/24/2014  . Sickle cell pain crisis 11/23/2013  . Facial rash 03/25/2013  . Birth control counseling 08/14/2012  . Asthma, mild intermittent 12/14/2010  . 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 Asthma   .  Sickle cell disease   . Amenorrhea   . Hyperbilirubinemia 02/26/2015  . Drug-induced pruritus 02/26/2015  . GERD (gastroesophageal reflux disease)   . Headache   . Anemia     SICKLE CELL   Past Surgical History: Past Surgical History  Procedure Laterality Date  . 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 Tonsillectomy      Age 60   Social History: History  Substance Use Topics  . Smoking status: Current Some Day Smoker -- 0.50 packs/day    Types: Cigarettes  . Smokeless tobacco: Never Used  . Alcohol Use: No     Comment: Denies 06/12/2014   Additional social history: None  Please also refer to relevant sections of EMR.  Family History: Family History  Problem Relation Age of Onset  . Hypertension Paternal Grandfather   . Sickle cell trait Father   . Cancer Mother     Died in 02-11-09   Allergies and Medications: No Known Allergies No current facility-administered medications on file prior to encounter.   Current Outpatient Prescriptions on File Prior to Encounter  Medication Sig Dispense Refill  . oxyCODONE (OXY IR/ROXICODONE) 5 MG immediate release tablet Take 5 mg by mouth every 4 (four) hours as needed. pain    . oxyCODONE-acetaminophen (PERCOCET/ROXICET) 5-325 MG per tablet Take 2 tablets by mouth every 4 (four) hours as needed for severe pain. 15 tablet 0  . hydrocortisone 1 % lotion Apply 1 application topically 2 (two) times daily. (Patient not taking: Reported on 02/25/2015) 118 mL 0  . ondansetron (ZOFRAN ODT) 4 MG disintegrating tablet Take 1 tablet (4 mg total) by mouth every 8 (eight) hours as needed for nausea or vomiting. (Patient not taking: Reported on 02/25/2015) 10 tablet 0  . ondansetron (ZOFRAN) 4 MG tablet Take 1 tablet (4 mg total) by mouth every 6 (six) hours. (Patient not taking: Reported on 12/01/2014) 12 tablet 0    Objective: BP 106/47 mmHg  Pulse 67  Temp(Src) 98.5 F (36.9 C) (Oral)  Resp 20  Ht 5\' 4"  (1.626 m)  Wt 144 lb 10 oz (65.6 kg)   BMI 24.81 kg/m2  SpO2 100%  LMP 03/06/2015 Exam: General: NAD, AAOx3 HEENT: NCAT, PERRLA, EOMI, O/P Clear, No LAD, FROM, Supple, Icterus  Cardiovascular: Systolic ejection murmur, No gallops or rubs, regular rate and rhythm.  Respiratory: CTA Bilaterally without crackles, rales, wheezes. Appropriate rate, unlabored. No chest TTP.  Abdomen: S, NT, ND, +BS, no organomegaly.  Extremities: WWP, 2+ distal pulses. TTP over lumbar spine, little if any TTP over arms / legs including her knees.  Skin: No rashes, no lesions.  Neuro: AAOx3, CN II-XII in tact grossly, no motor or sensory deficits. Grossly intact overall.   Labs and Imaging: CBC BMET   Recent Labs Lab 03/10/15 1112  WBC 8.1  HGB 7.1*  HCT 19.6*  PLT 446*    Recent Labs Lab 03/10/15 1112  NA 138  K  4.1  CL 108  CO2 22  BUN 7  CREATININE 0.63  GLUCOSE 96  CALCIUM 8.5*     CMP Latest Ref Rng 03/10/2015 03/09/2015 03/08/2015  Glucose 70 - 99 mg/dL 96 88 105(H)  BUN 6 - 20 mg/dL 7 7 6   Creatinine 0.44 - 1.00 mg/dL 0.63 0.70 0.93  Sodium 135 - 145 mmol/L 138 135 134(L)  Potassium 3.5 - 5.1 mmol/L 4.1 4.0 4.0  Chloride 101 - 111 mmol/L 108 107 101  CO2 22 - 32 mmol/L 22 21(L) 22  Calcium 8.9 - 10.3 mg/dL 8.5(L) 8.5(L) 9.4  Total Protein 6.5 - 8.1 g/dL 6.5 6.1(L) 8.3(H)  Total Bilirubin 0.3 - 1.2 mg/dL 7.9(H) 9.7(H) 8.3(H)  Alkaline Phos 38 - 126 U/L 46 44 54  AST 15 - 41 U/L 36 29 25  ALT 14 - 54 U/L 24 18 17    Urinalysis    Component Value Date/Time   COLORURINE AMBER* 03/08/2015 1036   APPEARANCEUR CLOUDY* 03/08/2015 1036   LABSPEC 1.012 03/08/2015 1036   PHURINE 5.5 03/08/2015 1036   GLUCOSEU NEGATIVE 03/08/2015 1036   HGBUR NEGATIVE 03/08/2015 1036   BILIRUBINUR NEGATIVE 03/08/2015 1036   KETONESUR NEGATIVE 03/08/2015 1036   PROTEINUR NEGATIVE 03/08/2015 1036   UROBILINOGEN 1.0 03/08/2015 1036   NITRITE NEGATIVE 03/08/2015 1036   LEUKOCYTESUR NEGATIVE 03/08/2015 1036   Urine Pregnancy Test - Negative.    Reticulocytes - 20%,   CXR - 03/08/15 FINDINGS: Heart size is upper normal, unchanged. The lungs are clear. There are no effusions. Hilar and mediastinal contours are unremarkable and unchanged.  IMPRESSION: No active cardiopulmonary disease.   Olam Idler, MD 03/10/2015, 3:28 PM PGY-2, [place] Intern pager: [phone], text pages welcome

## 2015-03-10 NOTE — H&P (Signed)
Patient refusing to answer anymore questions

## 2015-03-10 NOTE — Progress Notes (Signed)
Lab called with critical value. Pt's Hemoglobin 6.8. MD paged. Will continue to monitor. Makenly Larabee, Rande Brunt, RN

## 2015-03-10 NOTE — ED Provider Notes (Signed)
CSN: [id number]     Arrival date & time 03/10/15  1101 History   First MD Initiated Contact with Patient 03/10/15 02/26/1108     Chief Complaint  Patient presents with  . Sickle Cell Pain Crisis     (Consider location/radiation/quality/duration/timing/severity/associated sxs/prior Treatment) HPI Comments: Patient presents to the emergency department with chief complaint of sickle cell pain. Symptoms been persistent for the past 2 days. She was admitted last night for the same. I believe this morning because she had to take care of her dog. States that the pain is still persistent. Reports that it is 9 out of 10. It is in bilateral knees. She states that she needs to be readmitted. There are no aggravating or alleviating factors.  The history is provided by the patient. No language interpreter was used.    Past Medical History  Diagnosis Date  . Asthma   . Sickle cell disease   . Amenorrhea   . Hyperbilirubinemia 02/26/2015  . Drug-induced pruritus 02/26/2015  . GERD (gastroesophageal reflux disease)   . Headache   . Anemia     SICKLE CELL   Past Surgical History  Procedure Laterality Date  . Splenectomy      Age 37 for sequestration  . Tonsillectomy      Age 64   Family History  Problem Relation Age of Onset  . Hypertension Paternal Grandfather   . Sickle cell trait Father   . Cancer Mother     Died in 2009-02-25   History  Substance Use Topics  . Smoking status: Current Some Day Smoker -- 0.50 packs/day    Types: Cigarettes  . Smokeless tobacco: Never Used  . Alcohol Use: No     Comment: Denies 06/12/2014   OB History    No data available     Review of Systems  Constitutional: Negative for chills.  Gastrointestinal: Negative for diarrhea and constipation.  Genitourinary: Negative for dysuria.      Allergies  Review of patient's allergies indicates no known allergies.  Home Medications   Prior to Admission medications   Medication Sig Start Date End Date Taking?  Authorizing Provider  ibuprofen (ADVIL,MOTRIN) 200 MG tablet Take 1,000 mg by mouth every 6 (six) hours as needed for headache or moderate pain.   Yes Historical Provider, MD  oxyCODONE (OXY IR/ROXICODONE) 5 MG immediate release tablet Take 5 mg by mouth every 4 (four) hours as needed. pain   Yes Historical Provider, MD  oxyCODONE-acetaminophen (PERCOCET/ROXICET) 5-325 MG per tablet Take 2 tablets by mouth every 4 (four) hours as needed for severe pain. 06/17/14  Yes Ezequiel Essex, MD  hydrocortisone 1 % lotion Apply 1 application topically 2 (two) times daily. Patient not taking: Reported on 02/25/2015 12/01/14   Alvina Chou, PA-C  ondansetron (ZOFRAN ODT) 4 MG disintegrating tablet Take 1 tablet (4 mg total) by mouth every 8 (eight) hours as needed for nausea or vomiting. Patient not taking: Reported on 02/25/2015 12/01/14   Alvina Chou, PA-C  ondansetron (ZOFRAN) 4 MG tablet Take 1 tablet (4 mg total) by mouth every 6 (six) hours. Patient not taking: Reported on 12/01/2014 06/17/14   Ezequiel Essex, MD   BP 106/62 mmHg  Pulse 86  Temp(Src) 98.1 F (36.7 C) (Oral)  Resp 18  Ht 5\' 4"  (1.626 m)  Wt 135 lb (61.236 kg)  BMI 23.16 kg/m2  SpO2 94%  LMP 03/06/2015 Physical Exam  Constitutional: She is oriented to person, place, and time. She appears well-developed and well-nourished.  HENT:  Head: Normocephalic and atraumatic.  Eyes: Conjunctivae and EOM are normal. Pupils are equal, round, and reactive to light.  Neck: Normal range of motion. Neck supple.  Cardiovascular: Normal rate and regular rhythm.  Exam reveals no gallop and no friction rub.   No murmur heard. Pulmonary/Chest: Effort normal and breath sounds normal. No respiratory distress. She has no wheezes. She has no rales. She exhibits no tenderness.  Abdominal: Soft. Bowel sounds are normal. She exhibits no distension and no mass. There is no tenderness. There is no rebound and no guarding.  Musculoskeletal: Normal range  of motion. She exhibits no edema or tenderness.  Neurological: She is alert and oriented to person, place, and time.  Skin: Skin is warm and dry.  Psychiatric: She has a normal mood and affect. Her behavior is normal. Judgment and thought content normal.  Nursing note and vitals reviewed.   ED Course  Procedures (including critical care time) Labs Review Labs Reviewed  RETICULOCYTES - Abnormal; Notable for the following:    Retic Ct Pct 20.7 (*)    RBC. 2.03 (*)    Retic Count, Manual 420.2 (*)    All other components within normal limits  CBC WITH DIFFERENTIAL/PLATELET  COMPREHENSIVE METABOLIC PANEL  I-STAT BETA HCG BLOOD, ED ([facility], [facility], [facility] ONLY)    Imaging Review Dg Chest 2 View  03/08/2015   CLINICAL DATA:  Fever today.  History of asthma.  EXAM: CHEST  2 VIEW  COMPARISON:  09/01/2014  FINDINGS: Heart size is upper normal, unchanged. The lungs are clear. There are no effusions. Hilar and mediastinal contours are unremarkable and unchanged.  IMPRESSION: No active cardiopulmonary disease.   Electronically Signed   By: Andreas Newport M.D.   On: 03/08/2015 23:48     EKG Interpretation None      MDM   Final diagnoses:  Sickle cell pain crisis    Patient ended yesterday for sickle cell pain. Left AMA this morning secondary to needing to take care of her dog. Has now returned to be readmitted. Patient discussed with Dr. Aline Brochure, who recommends getting labs, but also discussing the patient with family medicine team.  11:45 AM Patient discussed with family medicine team, who will admit the patient. We'll transfer the patient back to count.  Montine Circle, PA-C 03/10/15 [place], MD 03/11/15 [phone]

## 2015-03-10 NOTE — Progress Notes (Signed)
Interim Progress Note:   S: Patient's prognosis due to patient complaining of suicidal ideation.  Upon entering the room.  Patient is tearful and conversing with nurse.  She admits to saying she was going to "slit her wrists" and "jump out the window." She reports this is due to her current level pain that is not being well controlled.  She denies any previous suicidal ideations or suicidal attempts.  Denies alcohol use, does endorse smoking tobacco as well as marijuana; smoked marijuana as recently as 3 days ago.   O:  BP 106/47 mmHg  Pulse 67  Temp(Src) 98.5 F (36.9 C) (Oral)  Resp 20  Ht 5\' 4"  (1.626 m)  Wt 144 lb 10 oz (65.6 kg)  BMI 24.81 kg/m2  SpO2 100%  LMP 03/06/2015 General: tearful, but calms quickly to conversation.   A/P  Suicidal Ideation  - Highly doubt actual suicidal ideation and suspect patient is being manipulative in order to receive IV pain medicines.  Patient left AMA earlier in the day to go take care of her dog; at that time she walked out and no distress without any pain medication.  Within hours of leaving AMA.  She called EMS to return her to the hospital due to uncontrolled pain.  When I entered the room earlier to readmit her and assess her pain.  She was lying in bed without any distress  watching TV.  Throughout the entire admission she was texting on her phone, requiring multiple prompts to answer questions.  Would have liked to discuss her pain and treatment options in greater details during admission, however she was not participating in her care.  - Ordered one time dose of IV Dilaudid until by mouth Dilaudid able to take effect - Discussed the seriousness of her suicidal ideation and need for psychological evaluation.  Consulted psychiatry due to specifics of her SI.  - Sitter precaution until cleared by psych   [facility] PGY-2 Resident

## 2015-03-11 DIAGNOSIS — F329 Major depressive disorder, single episode, unspecified: Secondary | ICD-10-CM

## 2015-03-11 DIAGNOSIS — F0631 Mood disorder due to known physiological condition with depressive features: Secondary | ICD-10-CM | POA: Diagnosis present

## 2015-03-11 DIAGNOSIS — F328 Other depressive episodes: Secondary | ICD-10-CM

## 2015-03-11 DIAGNOSIS — R45851 Suicidal ideations: Secondary | ICD-10-CM

## 2015-03-11 LAB — CBC
HCT: 18 % — ABNORMAL LOW (ref 36.0–46.0)
Hemoglobin: 6.8 g/dL — CL (ref 12.0–15.0)
MCH: 35.2 pg — ABNORMAL HIGH (ref 26.0–34.0)
MCHC: >37 g/dL — ABNORMAL HIGH (ref 30.0–36.0)
MCV: 93.3 fL (ref 78.0–100.0)
PLATELETS: 424 10*3/uL — AB (ref 150–400)
RBC: 1.93 MIL/uL — ABNORMAL LOW (ref 3.87–5.11)
RDW: 18.7 % — AB (ref 11.5–15.5)
WBC: 8 10*3/uL (ref 4.0–10.5)

## 2015-03-11 LAB — RETICULOCYTES
RBC.: 1.93 MIL/uL — AB (ref 3.87–5.11)
Retic Ct Pct: >23 % — ABNORMAL HIGH (ref 0.4–3.1)

## 2015-03-11 MED ORDER — MORPHINE SULFATE ER 15 MG PO TBCR: 15.0000 mg | EXTENDED_RELEASE_TABLET | Freq: Two times a day (BID) | ORAL | Status: DC

## 2015-03-11 MED ORDER — FOLIC ACID 1 MG PO TABS: 1.0000 mg | ORAL_TABLET | Freq: Every day | ORAL | Status: DC
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
## 2015-03-11 NOTE — Discharge Instructions (Signed)
Sickle Cell Anemia °Sickle cell anemia is a condition where your red blood cells are shaped like sickles. Red blood cells carry oxygen through the body. Sickle-shaped red blood cells do not live as long as normal red blood cells. They also clump together and block blood from flowing through the blood vessels. These things prevent the body from getting enough oxygen. Sickle cell anemia causes organ damage and pain. It also increases the risk of infection. °HOME CARE °· Drink enough fluid to keep your pee (urine) clear or pale yellow. Drink more in hot weather and during exercise. °· Do not smoke. Smoking lowers oxygen levels in the blood. °· Only take over-the-counter or prescription medicines as told by your doctor. °· Take antibiotic medicines as told by your doctor. Make sure you finish them even if you start to feel better. °· Take supplements as told by your doctor. °· Consider wearing a medical alert bracelet. This tells anyone caring for you in an emergency of your condition. °· When traveling, keep your medical information, doctors' names, and the medicines you take with you at all times. °· If you have a fever, do not take fever medicines right away. This could cover up a problem. Tell your doctor. °·  Keep all follow-up visits with your doctor. Sickle cell anemia requires regular medical care. °GET HELP IF: °You have a fever. °GET HELP RIGHT AWAY IF: °· You feel dizzy or faint. °· You have new belly (abdominal) pain, especially on the left side near the stomach area. °· You have a lasting, often uncomfortable and painful erection of the penis (priapism). If it is not treated right away, you will become unable to have sex (impotence). °· You have numbness in your arms or legs or you have a hard time moving them. °· You have a hard time talking. °· You have a fever or lasting symptoms for more than 2-3 days. °· You have a fever and your symptoms suddenly get worse. °· You have signs or symptoms of infection.  These include: °¨ Chills. °¨ Being more tired than normal (lethargy). °¨ Irritability. °¨ Poor eating. °¨ Throwing up (vomiting). °· You have pain that is not helped with medicine. °· You have shortness of breath. °· You have pain in your chest. °· You are coughing up pus-like or bloody mucus. °· You have a stiff neck. °· Your feet or hands swell or have pain. °· Your belly looks bloated. °· Your joints hurt. °MAKE SURE YOU: °· Understand these instructions. °· Will watch your condition. °· Will get help right away if you are not doing well or get worse. °Document Released: 08/06/2013 Document Revised: 03/02/2014 Document Reviewed: 08/06/2013 °[employer]® Patient Information ©2015 [employer]. This information is not intended to replace advice given to you by your health care provider. Make sure you discuss any questions you have with your health care provider. ° °

## 2015-03-11 NOTE — Consult Note (Signed)
[facility] Face-to-Face Psychiatry Consult   Reason for Consult:  Agitated depression and suicide ideation Referring Physician:  Dr. Ree Kida / Dr. Jerline Pain Patient Identification: [name] MRN:  [id number] Principal Diagnosis: Other depression due to general medical condition Diagnosis:   Patient Active Problem List   Diagnosis Date Noted  . 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 Suicide ideation [R45.851] 03/11/2015  . Psychomotor agitation [R45.0] 03/11/2015  . Sickle cell anemia with pain [D57.00] 03/10/2015  . Vasoocclusive sickle cell crisis [D57.00]   . Hyperbilirubinemia [E80.6] 02/26/2015  . Drug-induced pruritus [L29.9] 02/26/2015  . Sickle cell anemia with crisis [D57.00] 02/25/2015  . Tobacco abuse [Z72.0]   . Sickle cell crisis [D57.00]   . Leukocytosis [D72.829]   . Hematochezia [K92.1] 02/27/2014  . UTI (lower urinary tract infection) [N39.0] 02/26/2014  . Vaginal discharge [N89.8] 02/24/2014  . Sickle cell pain crisis [D57.00] 11/23/2013  . Facial rash [R21] 03/25/2013  . Birth control counseling [Z30.9] 08/14/2012  . Asthma, mild intermittent [J45.20] 12/14/2010  . 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 female patient admitted with sickle cell crisis.  HPI:  [name] is a 19 years old African-American female admitted to [facility] with a sickle cell crisis and pain in her back, neck, legs and knees. Psychiatric consultation was requested secondary to increased agitation, depression and reportedly aggressive behavior during last night with safety sitter. Patient reported she made a statement that he people does not get pain medication about 5 hours after reaching the physician/Hospital people might jump out of the tall building or cut them himself to end their life. Patient denies being depressed,  aggressive or suicidal/homicidal ideation, intention or plans. Patient stated she received her pain medication and feeling much better and does not want to be in hospital any longer. Patient was on involuntary commitment secondary to threatening behavior and agitation. Patient reported her mother died 14 years ago and her father lives with her grandparents along with her. Patient appeared very irritable, and poorly cooperative during this evaluation. Patient stated multiple people are coming and asking similar questions. Patient has a Air cabin crew next to her. Patient denies history of mental health treatment either inpatient or outpatient. Patient denies family history of mental illness. Patient stated she was dropped out at 10th grade from [employer] and planning to go back to school. Patient endorses using cannabis but denies alcohol and other illicit drugs. Patient urine drug screen is positive for tetrahydrocannabinol and opiates.  HPI Elements:  Location:  Agitated depression. Quality:  Fair. Severity:  Acute pain syndrome. Timing:  Sickle cell crisis. Duration:  Few days. Context:  Unknown psychosocial stressors.  Past Medical History:  Past Medical History  Diagnosis Date  . Asthma   . Sickle cell disease   . Amenorrhea   . Hyperbilirubinemia 02/26/2015  . Drug-induced pruritus 02/26/2015  . GERD (gastroesophageal reflux disease)   . Headache   . Anemia     SICKLE CELL    Past Surgical History  Procedure Laterality Date  . 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 Tonsillectomy      Age 63   Family History:  Family History  Problem Relation Age of Onset  . Hypertension Paternal Grandfather   . Sickle cell trait Father   . 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 Marital  Status: Single    Spouse Name: N/A  . Number of Children: N/A  . Years of Education: N/A   Social History Main Topics  . Smoking status: Current Some Day Smoker -- 0.50 packs/day    Types: Cigarettes  . Smokeless tobacco: Never Used  . Alcohol Use: No     Comment: Denies 06/12/2014  . 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 Sexual Activity: Yes    Birth Control/ Protection: Condom     Comment: Stable relationship, trying X2 years for pregnancy; [name] is boyfriend (19)   Other Topics Concern  . None   Social History Narrative   Pt is doing much better in school [employer] 11th grade)   Really wants to go to college now after talking to her cousin.  Pt has aspiration of being a lawyer/physician/nurse/owning business after school.   Pt is now living with her father again.           Mom died at age 25 and patient has a lot of fears abotu dying young and really wants to be a mom but has other goals. She was 13 at time of moms death and does not have much contact with her 20 year old biological sister which saddens her.                   Additional Social History:                          Allergies:  No Known Allergies  Labs:  Results for orders placed or performed during the hospital encounter of 03/10/15 (from the past 48 hour(s))  CBC WITH DIFFERENTIAL     Status: Abnormal   Collection Time: 03/10/15 11:12 AM  Result Value Ref Range   WBC 8.1 4.0 - 10.5 K/uL   RBC 2.01 (L) 3.87 - 5.11 MIL/uL   Hemoglobin 7.1 (L) 12.0 - 15.0 g/dL   HCT 19.6 (L) 36.0 - 46.0 %   MCV 97.5 78.0 - 100.0 fL   MCH 35.3 (H) 26.0 - 34.0 pg   MCHC 36.2 (H) 30.0 - 36.0 g/dL   RDW 18.9 (H) 11.5 - 15.5 %   Platelets 446 (H) 150 - 400 K/uL   Neutrophils Relative % 44 43 - 77 %   Neutro Abs 3.6 1.7 - 7.7 K/uL   Lymphocytes Relative 40 12 - 46 %   Lymphs Abs 3.2 0.7 - 4.0 K/uL   Monocytes Relative 15 (H) 3 - 12 %   Monocytes Absolute 1.2 (H) 0.1 - 1.0  K/uL   Eosinophils Relative 1 0 - 5 %   Eosinophils Absolute 0.1 0.0 - 0.7 K/uL   Basophils Relative 0 0 - 1 %   Basophils Absolute 0.0 0.0 - 0.1 K/uL  Comprehensive metabolic panel     Status: Abnormal   Collection Time: 03/10/15 11:12 AM  Result Value Ref Range   Sodium 138 135 - 145 mmol/L   Potassium 4.1 3.5 - 5.1 mmol/L   Chloride 108 101 - 111 mmol/L   CO2 22 22 -  32 mmol/L   Glucose, Bld 96 70 - 99 mg/dL   BUN 7 6 - 20 mg/dL   Creatinine, Ser 0.63 0.44 - 1.00 mg/dL   Calcium 8.5 (L) 8.9 - 10.3 mg/dL   Total Protein 6.5 6.5 - 8.1 g/dL   Albumin 3.8 3.5 - 5.0 g/dL   AST 36 15 - 41 U/L   ALT 24 14 - 54 U/L   Alkaline Phosphatase 46 38 - 126 U/L   Total Bilirubin 7.9 (H) 0.3 - 1.2 mg/dL   GFR calc non Af Amer >60 >60 mL/min   GFR calc Af Amer >60 >60 mL/min    Comment: (NOTE) The eGFR has been calculated using the CKD EPI equation. This calculation has not been validated in all clinical situations. eGFR's persistently <60 mL/min signify possible Chronic Kidney Disease.    Anion gap 8 5 - 15  Reticulocytes     Status: Abnormal   Collection Time: 03/10/15 11:12 AM  Result Value Ref Range   Retic Ct Pct 20.7 (H) 0.4 - 3.1 %   RBC. 2.03 (L) 3.87 - 5.11 MIL/uL   Retic Count, Manual 420.2 (H) 19.0 - 186.0 K/uL  I-Stat Beta hCG blood, ED ([facility], [facility], [facility] only)     Status: None   Collection Time: 03/10/15 11:17 AM  Result Value Ref Range   I-stat hCG, quantitative <5.0 <5 mIU/mL   Comment 3            Comment:   GEST. AGE      CONC.  (mIU/mL)   <=1 WEEK        5 - 50     2 WEEKS       50 - 500     3 WEEKS       100 - 10,000     4 WEEKS     1,000 - 30,000        FEMALE AND NON-PREGNANT FEMALE:     LESS THAN 5 mIU/mL   Urine rapid drug screen (hosp performed)     Status: Abnormal   Collection Time: 03/10/15 10:03 PM  Result Value Ref Range   Opiates POSITIVE (A) NONE DETECTED   Cocaine NONE DETECTED NONE DETECTED   Benzodiazepines NONE DETECTED NONE DETECTED   Amphetamines  NONE DETECTED NONE DETECTED   Tetrahydrocannabinol POSITIVE (A) NONE DETECTED   Barbiturates NONE DETECTED NONE DETECTED    Comment:        DRUG SCREEN FOR MEDICAL PURPOSES ONLY.  IF CONFIRMATION IS NEEDED FOR ANY PURPOSE, NOTIFY LAB WITHIN 5 DAYS.        LOWEST DETECTABLE LIMITS FOR URINE DRUG SCREEN Drug Class       Cutoff (ng/mL) Amphetamine      1000 Barbiturate      200 Benzodiazepine   810 Tricyclics       175 Opiates          300 Cocaine          300 THC              50     Vitals: Blood pressure 107/73, pulse 61, temperature 98.2 F (36.8 C), temperature source Oral, resp. rate 16, height '5\' 4"'  (1.626 m), weight 65.6 kg (144 lb 10 oz), last menstrual period 03/06/2015, SpO2 98 %.  Risk to Self: Is patient at risk for suicide?: No Risk to Others:   Prior Inpatient Therapy:   Prior Outpatient Therapy:    Current Facility-Administered Medications  Medication  Dose Route Frequency Provider Last Rate Last Dose  . 0.9 %  sodium chloride infusion  250 mL Intravenous PRN Olam Idler, MD      . folic acid (FOLVITE) tablet 1 mg  1 mg Oral Daily Olam Idler, MD   1 mg at 03/10/15 1700  . heparin injection 5,000 Units  5,000 Units Subcutaneous 3 times per day Olam Idler, MD   5,000 Units at 03/10/15 2159  . HYDROmorphone (DILAUDID) tablet 1 mg  1 mg Oral Q3H PRN Olam Idler, MD   1 mg at 03/10/15 2340  . morphine (MS CONTIN) 12 hr tablet 15 mg  15 mg Oral Q12H Olam Idler, MD   15 mg at 03/10/15 2156  . nicotine (NICODERM CQ - dosed in mg/24 hours) patch 14 mg  14 mg Transdermal Daily Olam Idler, MD   14 mg at 03/10/15 1646  . ondansetron (ZOFRAN) tablet 4 mg  4 mg Oral Q6H PRN Olam Idler, MD       Or  . ondansetron [facility]) injection 4 mg  4 mg Intravenous Q6H PRN Olam Idler, MD      . oxyCODONE-acetaminophen (PERCOCET/ROXICET) 5-325 MG per tablet 1-2 tablet  1-2 tablet Oral Q4H PRN Olam Idler, MD   2 tablet at 03/11/15 0329  . senna-docusate  (Senokot-S) tablet 1 tablet  1 tablet Oral QHS PRN Olam Idler, MD      . sodium chloride 0.9 % injection 3 mL  3 mL Intravenous Q12H Olam Idler, MD   3 mL at 03/10/15 2156  . sodium chloride 0.9 % injection 3 mL  3 mL Intravenous PRN Olam Idler, MD        Musculoskeletal: Strength & Muscle Tone: within normal limits Gait & Station: normal Patient leans: N/A  Psychiatric Specialty Exam: Physical Exam as per history and physical   ROS multiple pains in her back, neck, hands and knee and leg which seems to be associated with the sickle cell crisis, negative for comprehensive review of systems except history of present illness   Blood pressure 107/73, pulse 61, temperature 98.2 F (36.8 C), temperature source Oral, resp. rate 16, height '5\' 4"'  (1.626 m), weight 65.6 kg (144 lb 10 oz), last menstrual period 03/06/2015, SpO2 98 %.Body mass index is 24.81 kg/(m^2).  General Appearance: Guarded  Eye Contact::  Good  Speech:  Clear and Coherent  Volume:  Normal  Mood:  Anxious and Irritable  Affect:  Appropriate and Congruent  Thought Process:  Coherent and Goal Directed  Orientation:  Full (Time, Place, and Person)  Thought Content:  WDL and Rumination  Suicidal Thoughts:  No  Homicidal Thoughts:  No  Memory:  Immediate;   Good Recent;   Good  Judgement:  Fair  Insight:  Shallow  Psychomotor Activity:  Increased  Concentration:  Fair  Recall:  Good  Fund of Knowledge:Good  Language: Good  Akathisia:  Negative  Handed:  Right  AIMS (if indicated):     Assets:  Communication Skills Desire for Improvement Housing Intimacy Leisure Time Resilience Social Support Talents/Skills Transportation  ADL's:  Intact  Cognition: WNL  Sleep:      Medical Decision Making: Review of Psycho-Social Stressors (1), Review or order clinical lab tests (1), Discuss test with performing physician (1), Established Problem, Worsening (2), Review or order medicine tests (1), Review of  Medication Regimen & Side Effects (2) and Review of New Medication or Change  in Dosage (2)  Treatment Plan Summary: Patient presented with increased symptoms of irritability, agitation, depression and inpatient/impulsive behaviors secondary to acute pain syndrome secondary to sickle cell crisis. Patient has a negative attitude towards other people. Patient denies current safety concerns and requested to go home after good pain control. Daily contact with patient to assess and evaluate symptoms and progress in treatment and Medication management  Plan:   Depression: Patient has few symptoms of depression but does not meet criteria for major depressive disorder and does not required medication management at this time  Suicidal ideation: Safety sitter is in place and may discontinue as patient is contracting for safety at this time  Substance abuse: Counseled to stay sober or obtain outpatient substance abuse counseling.   Involuntary commitment. May rescind IVC as patient does not meet criteria for acute psychiatric hospitalization.  Patient does not meet criteria for psychiatric inpatient admission. Supportive therapy provided about ongoing stressors.  Appreciate psychiatric consultation and will sign off at this time Please contact [phone] or [phone] if needs further assistance   Disposition: Patient may be discharged home when medically stable and will refer to the outpatient psychiatric services if further symptoms of depression or anxiety continues or evolves.   Acelynn Dejonge,JANARDHAHA R. 03/11/2015 10:10 AM
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
## 2015-03-11 NOTE — Progress Notes (Signed)
Attempted to give pt her scheduled medication but pt refused. Pt states "I don't need any medicine my pain is at a 3. I just want to go home." Writer did not push the issue but told pt to let me know if her pain got worse. Pt agreed.

## 2015-03-11 NOTE — Progress Notes (Signed)
Patient transferred from [address] [place] to floor, accompanied by a sitter for suicide precautions.  Family present as well. Will continue to monitor.

## 2015-03-11 NOTE — Progress Notes (Signed)
Discharge instructions reviewed with pt; allowing time for questions. Pt verbalized understanding. Prescription given to pt. Pt to leave the unit accompanied by Tech.

## 2015-03-14 NOTE — Progress Notes (Signed)
Utilization review completed- retro 

## 2015-05-04 IMAGING — CR DG CHEST 2V
2 series · 2 of 2 positions shown · non-contrast
Comparison: 03/08/2013

CLINICAL DATA: Chest pain and shortness of breath today. Sickle
cell crisis.

EXAM:
CHEST  2 VIEW

[w chest pa]
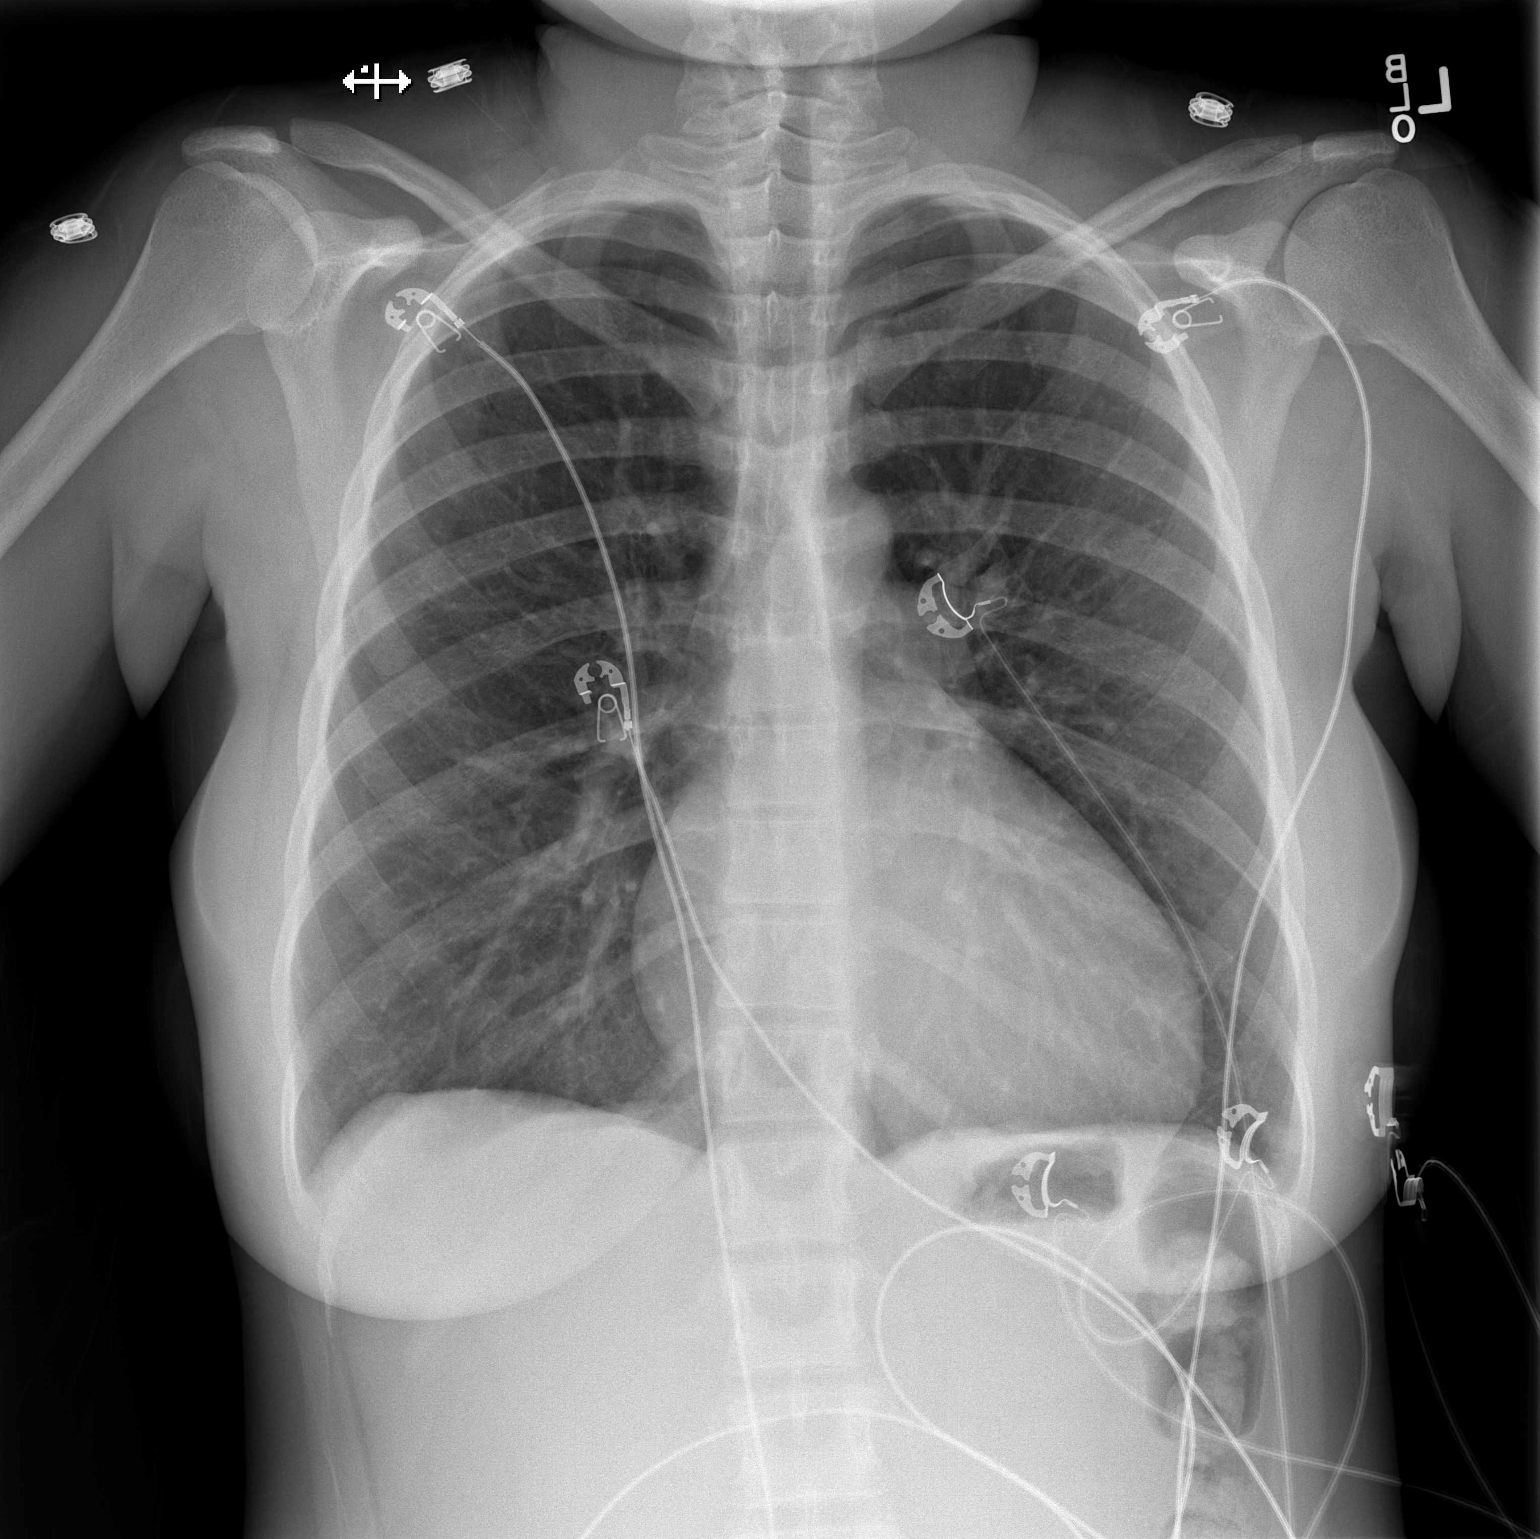

[w chest lat]
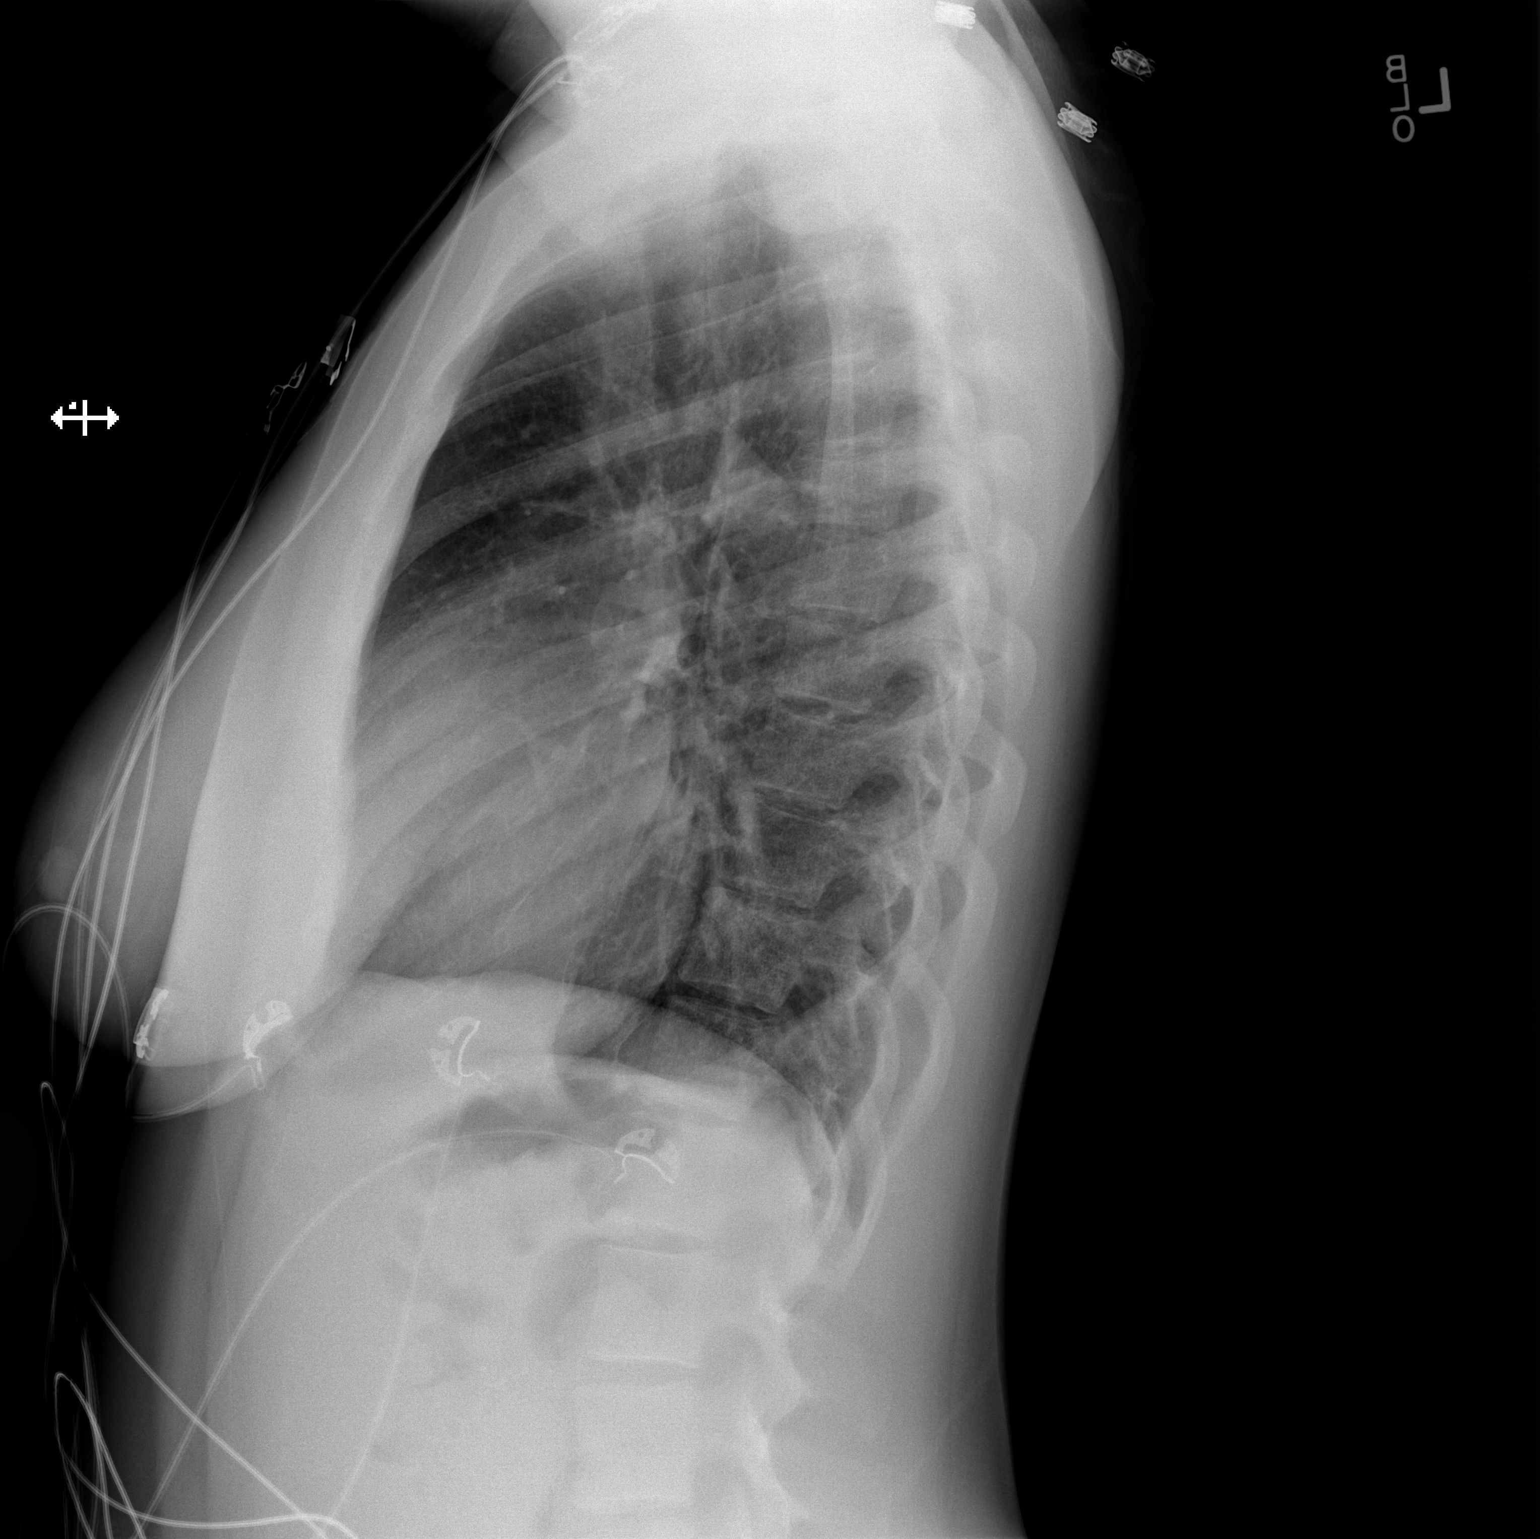

[2 of 2 positions shown; findings below may reference images not displayed]

FINDINGS: Borderline heart size and pulmonary vascularity similar to previous
study. No evidence of edema or focal consolidation. No blunting of
costophrenic angles. No pneumothorax. Mediastinal contours appear
intact.
IMPRESSION: Borderline heart size and pulmonary vascularity. No focal
consolidation or edema.

## 2015-08-11 ENCOUNTER — Emergency Department (HOSPITAL_COMMUNITY): Payer: Medicare Other

## 2015-08-11 ENCOUNTER — Encounter (HOSPITAL_COMMUNITY): Payer: Self-pay | Admitting: Emergency Medicine

## 2015-08-11 ENCOUNTER — Inpatient Hospital Stay (HOSPITAL_COMMUNITY)
Admission: EM | Admit: 2015-08-11 | Discharge: 2015-08-15 | DRG: 812 | Disposition: A | Discharge status: Home / Self Care | Payer: Medicare Other | Attending: Family Medicine | Admitting: Family Medicine

## 2015-08-11 DIAGNOSIS — D589 Hereditary hemolytic anemia, unspecified: Secondary | ICD-10-CM | POA: Insufficient documentation

## 2015-08-11 DIAGNOSIS — Z8249 Family history of ischemic heart disease and other diseases of the circulatory system: Secondary | ICD-10-CM | POA: Diagnosis not present

## 2015-08-11 DIAGNOSIS — Z832 Family history of diseases of the blood and blood-forming organs and certain disorders involving the immune mechanism: Secondary | ICD-10-CM | POA: Diagnosis not present

## 2015-08-11 DIAGNOSIS — Z720 Tobacco use: Secondary | ICD-10-CM | POA: Diagnosis not present

## 2015-08-11 DIAGNOSIS — R0989 Other specified symptoms and signs involving the circulatory and respiratory systems: Secondary | ICD-10-CM

## 2015-08-11 DIAGNOSIS — F17200 Nicotine dependence, unspecified, uncomplicated: Secondary | ICD-10-CM | POA: Diagnosis present

## 2015-08-11 DIAGNOSIS — Z809 Family history of malignant neoplasm, unspecified: Secondary | ICD-10-CM

## 2015-08-11 DIAGNOSIS — I361 Nonrheumatic tricuspid (valve) insufficiency: Secondary | ICD-10-CM | POA: Diagnosis not present

## 2015-08-11 DIAGNOSIS — J4520 Mild intermittent asthma, uncomplicated: Secondary | ICD-10-CM | POA: Diagnosis not present

## 2015-08-11 DIAGNOSIS — D5700 Hb-SS disease with crisis, unspecified: Principal | ICD-10-CM | POA: Diagnosis present

## 2015-08-11 LAB — I-STAT TROPONIN, ED: Troponin i, poc: 0 ng/mL (ref 0.00–0.08)

## 2015-08-11 LAB — BASIC METABOLIC PANEL
ANION GAP: 5 (ref 5–15)
BUN: 7 mg/dL (ref 6–20)
CO2: 26 mmol/L (ref 22–32)
Calcium: 9.1 mg/dL (ref 8.9–10.3)
Chloride: 106 mmol/L (ref 101–111)
Creatinine, Ser: 0.75 mg/dL (ref 0.44–1.00)
GFR calc Af Amer: >60 mL/min (ref >60)
GFR calc non Af Amer: >60 mL/min (ref >60)
Glucose, Bld: 76 mg/dL (ref 65–99)
POTASSIUM: 4.3 mmol/L (ref 3.5–5.1)
SODIUM: 137 mmol/L (ref 135–145)

## 2015-08-11 LAB — URINALYSIS, ROUTINE W REFLEX MICROSCOPIC
Bilirubin Urine: NEGATIVE
Glucose, UA: NEGATIVE mg/dL
Hgb urine dipstick: NEGATIVE
KETONES UR: NEGATIVE mg/dL
Leukocytes, UA: NEGATIVE
NITRITE: NEGATIVE
Protein, ur: NEGATIVE mg/dL
Specific Gravity, Urine: 1.011 (ref 1.005–1.030)
UROBILINOGEN UA: 1 mg/dL (ref 0.0–1.0)
pH: 6 (ref 5.0–8.0)

## 2015-08-11 LAB — CBC
HEMATOCRIT: 24.6 % — AB (ref 36.0–46.0)
HEMOGLOBIN: 9 g/dL — AB (ref 12.0–15.0)
MCH: 36.4 pg — ABNORMAL HIGH (ref 26.0–34.0)
MCHC: 36.6 g/dL — ABNORMAL HIGH (ref 30.0–36.0)
MCV: 99.6 fL (ref 78.0–100.0)
Platelets: 586 10*3/uL — ABNORMAL HIGH (ref 150–400)
RBC: 2.47 MIL/uL — ABNORMAL LOW (ref 3.87–5.11)
RDW: 17.6 % — ABNORMAL HIGH (ref 11.5–15.5)
WBC: 11 10*3/uL — ABNORMAL HIGH (ref 4.0–10.5)

## 2015-08-11 LAB — HEPATIC FUNCTION PANEL
ALT: 24 U/L (ref 14–54)
AST: 28 U/L (ref 15–41)
Albumin: 4.2 g/dL (ref 3.5–5.0)
Alkaline Phosphatase: 49 U/L (ref 38–126)
BILIRUBIN DIRECT: 0.4 mg/dL (ref 0.1–0.5)
Indirect Bilirubin: 5.1 mg/dL — ABNORMAL HIGH (ref 0.3–0.9)
TOTAL PROTEIN: 7.6 g/dL (ref 6.5–8.1)
Total Bilirubin: 5.5 mg/dL — ABNORMAL HIGH (ref 0.3–1.2)

## 2015-08-11 LAB — PREGNANCY, URINE: PREG TEST UR: NEGATIVE

## 2015-08-11 MED ORDER — HYDROMORPHONE HCL 2 MG/ML IJ SOLN
2.0000 mg | Freq: Once | INTRAMUSCULAR | Status: AC
  Administered 2015-08-11: 2 mg via INTRAVENOUS
  Filled 2015-08-11: qty 1

## 2015-08-11 MED ORDER — HYDROMORPHONE HCL 1 MG/ML IJ SOLN
1.0000 mg | Freq: Once | INTRAMUSCULAR | Status: AC
  Administered 2015-08-11: 1 mg via INTRAVENOUS
  Filled 2015-08-11: qty 1

## 2015-08-11 NOTE — ED Provider Notes (Signed)
CSN: [id number]     Arrival date & time 08/11/15  1859 History   None    Chief Complaint  Patient presents with  . Chest Pain  . Back Pain  . Ankle Pain     (Consider location/radiation/quality/duration/timing/severity/associated sxs/prior Treatment) HPI Complains of anterior chest pain and diffuse back pain onset 4 days ago accompanied by bilateral ankle pain and left wrist pain. Bilateral ankle pain and left wrist pain typical of her sickle cell crisis however she's never explains back pain or chest pain before with sickle cell. She denies fever admits to slight cough denies shortness of breath no other associated symptoms. She treated herself with the family members Roxicet last night and an over-the-counter back and pain relief medication, without adequate pain relief. Nothing makes symptoms better or worse. No nausea or vomiting. No other associated symptoms. No abdominal pain Past Medical History  Diagnosis Date  . Asthma   . Sickle cell disease ([place])   . Amenorrhea   . Hyperbilirubinemia 02/26/2015  . Drug-induced pruritus 02/26/2015  . GERD (gastroesophageal reflux disease)   . Headache   . Anemia     SICKLE CELL   Past Surgical History  Procedure Laterality Date  . 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 Tonsillectomy      Age 37   Family History  Problem Relation Age of Onset  . Hypertension Paternal Grandfather   . Sickle cell trait Father   . Cancer Mother     Died in February 04, 2009   Social History  Substance Use Topics  . Smoking status: Current Some Day Smoker -- 0.50 packs/day    Types: Cigarettes  . Smokeless tobacco: Never Used  . Alcohol Use: No     Comment: Denies 06/12/2014   OB History    No data available     Review of Systems  Constitutional: Negative.   HENT: Negative.   Respiratory: Negative.   Cardiovascular: Positive for chest pain.  Gastrointestinal: Negative.   Musculoskeletal: Positive for back pain and arthralgias.  Skin: Negative.    Allergic/Immunologic: Positive for immunocompromised state.       Sickle cell disease, splenectomy  Neurological: Negative.   Psychiatric/Behavioral: Negative.   All other systems reviewed and are negative.     Allergies  Review of patient's allergies indicates no known allergies.  Home Medications   Prior to Admission medications   Medication Sig Start Date End Date Taking? Authorizing Provider  folic acid (FOLVITE) 1 MG tablet Take 1 tablet (1 mg total) by mouth daily. 03/11/15   Vivi Barrack, MD  hydrocortisone 1 % lotion Apply 1 application topically 2 (two) times daily. Patient not taking: Reported on 02/25/2015 12/01/14   Alvina Chou, PA-C  ibuprofen (ADVIL,MOTRIN) 200 MG tablet Take 1,000 mg by mouth every 6 (six) hours as needed for headache or moderate pain.    Historical Provider, MD  morphine (MS CONTIN) 15 MG 12 hr tablet Take 1 tablet (15 mg total) by mouth every 12 (twelve) hours. 03/11/15   Vivi Barrack, MD  ondansetron (ZOFRAN ODT) 4 MG disintegrating tablet Take 1 tablet (4 mg total) by mouth every 8 (eight) hours as needed for nausea or vomiting. Patient not taking: Reported on 02/25/2015 12/01/14   Alvina Chou, PA-C  ondansetron (ZOFRAN) 4 MG tablet Take 1 tablet (4 mg total) by mouth every 6 (six) hours. Patient not taking: Reported on 12/01/2014 06/17/14   Ezequiel Essex, MD  oxyCODONE (OXY IR/ROXICODONE)  5 MG immediate release tablet Take 5 mg by mouth every 4 (four) hours as needed. pain    Historical Provider, MD  oxyCODONE-acetaminophen (PERCOCET/ROXICET) 5-325 MG per tablet Take 2 tablets by mouth every 4 (four) hours as needed for severe pain. 06/17/14   Ezequiel Essex, MD   LMP 06/01/2015 Physical Exam  Constitutional: She is oriented to person, place, and time. She appears well-developed and well-nourished. No distress.  HENT:  Head: Normocephalic and atraumatic.  Eyes: Conjunctivae are normal. Pupils are equal, round, and reactive to light.  Scleral icterus is present.  Neck: Neck supple. No tracheal deviation present. No thyromegaly present.  Cardiovascular: Normal rate and regular rhythm.   No murmur heard. Pulmonary/Chest: Effort normal and breath sounds normal.  Abdominal: Soft. Bowel sounds are normal. She exhibits no distension. There is no tenderness.  Musculoskeletal: Normal range of motion. She exhibits no edema or tenderness.  All 4 extremities without redness swelling or tenderness neurovascularly intact  Neurological: She is alert and oriented to person, place, and time. No cranial nerve deficit. Coordination normal.  Skin: Skin is warm and dry. No rash noted.  Psychiatric: She has a normal mood and affect.  Nursing note and vitals reviewed.   ED Course  Procedures (including critical care time) Labs Review Labs Reviewed  BASIC METABOLIC PANEL  CBC    Imaging Review No results found. I have personally reviewed and evaluated these images and lab results as part of my medical decision-making.   EKG Interpretation   Date/Time:  Wednesday August 11 2015 19:03:54 EDT Ventricular Rate:  73 PR Interval:  143 QRS Duration: 86 QT Interval:  407 QTC Calculation: 448 R Axis:   72 Text Interpretation:  Sinus rhythm Borderline T wave abnormalities  Baseline wander in lead(s) V5 Confirmed by Winfred Leeds  MD, Krisalyn Yankowski [phone]) on  08/11/2015 7:09:19 PM     Chest x-ray viewed by me  10:20 PM pain is not well controlled after multiple doses of intravenous hydromorphone. Results for orders placed or performed during the hospital encounter of 83/41/96  Basic metabolic panel  Result Value Ref Range   Sodium 137 135 - 145 mmol/L   Potassium 4.3 3.5 - 5.1 mmol/L   Chloride 106 101 - 111 mmol/L   CO2 26 22 - 32 mmol/L   Glucose, Bld 76 65 - 99 mg/dL   BUN 7 6 - 20 mg/dL   Creatinine, Ser 0.75 0.44 - 1.00 mg/dL   Calcium 9.1 8.9 - 10.3 mg/dL   GFR calc non Af Amer >60 >60 mL/min   GFR calc Af Amer >60 >60 mL/min    Anion gap 5 5 - 15  CBC  Result Value Ref Range   WBC 11.0 (H) 4.0 - 10.5 K/uL   RBC 2.47 (L) 3.87 - 5.11 MIL/uL   Hemoglobin 9.0 (L) 12.0 - 15.0 g/dL   HCT 24.6 (L) 36.0 - 46.0 %   MCV 99.6 78.0 - 100.0 fL   MCH 36.4 (H) 26.0 - 34.0 pg   MCHC 36.6 (H) 30.0 - 36.0 g/dL   RDW 17.6 (H) 11.5 - 15.5 %   Platelets 586 (H) 150 - 400 K/uL  Urinalysis, Routine w reflex microscopic (not at [facility])  Result Value Ref Range   Color, Urine YELLOW YELLOW   APPearance CLEAR CLEAR   Specific Gravity, Urine 1.011 1.005 - 1.030   pH 6.0 5.0 - 8.0   Glucose, UA NEGATIVE NEGATIVE mg/dL   Hgb urine dipstick NEGATIVE NEGATIVE  Bilirubin Urine NEGATIVE NEGATIVE   Ketones, ur NEGATIVE NEGATIVE mg/dL   Protein, ur NEGATIVE NEGATIVE mg/dL   Urobilinogen, UA 1.0 0.0 - 1.0 mg/dL   Nitrite NEGATIVE NEGATIVE   Leukocytes, UA NEGATIVE NEGATIVE  Hepatic function panel  Result Value Ref Range   Total Protein 7.6 6.5 - 8.1 g/dL   Albumin 4.2 3.5 - 5.0 g/dL   AST 28 15 - 41 U/L   ALT 24 14 - 54 U/L   Alkaline Phosphatase 49 38 - 126 U/L   Total Bilirubin 5.5 (H) 0.3 - 1.2 mg/dL   Bilirubin, Direct 0.4 0.1 - 0.5 mg/dL   Indirect Bilirubin 5.1 (H) 0.3 - 0.9 mg/dL  Pregnancy, urine  Result Value Ref Range   Preg Test, Ur NEGATIVE NEGATIVE  I-stat troponin, ED  Result Value Ref Range   Troponin i, poc 0.00 0.00 - 0.08 ng/mL   Comment 3           Dg Chest 2 View  08/11/2015  CLINICAL DATA:  19 year old female with chest pain for 3 days. Sickle cell disease. Initial encounter. EXAM: CHEST  2 VIEW COMPARISON:  03/08/2015 and earlier. FINDINGS: Stable mild cardiomegaly. Other mediastinal contours are within normal limits. Visualized tracheal air column is within normal limits. No pneumothorax, pulmonary edema, pleural effusion or confluent pulmonary opacity. No osseous abnormality identified. Negative visible bowel gas pattern. IMPRESSION: No acute cardiopulmonary abnormality. Electronically Signed   By: Genevie Ann  M.D.   On: 08/11/2015 19:35    MDM  I spoke with Dr.Melancon plan transfer to [facility] medical surgical floor for admission for pain control Diagnosis acute sickle cell crisis Final diagnoses:  None        Orlie Dakin, MD 08/11/15 2222

## 2015-08-11 NOTE — ED Notes (Signed)
Call placed to [place].  Dispatcher could not give transport time.

## 2015-08-11 NOTE — ED Notes (Signed)
Soda and water given to patient

## 2015-08-11 NOTE — ED Notes (Signed)
Patient transported to X-ray 

## 2015-08-11 NOTE — ED Notes (Signed)
Pt states that she has sickle cell, but "pain in my chest, lower back, ankles are different then my normal pains".  Pt states that pain has been going on since Saturday. Pt dosen't have any pain meds at home and take OTC meds and not helping.

## 2015-08-12 DIAGNOSIS — J4520 Mild intermittent asthma, uncomplicated: Secondary | ICD-10-CM

## 2015-08-12 DIAGNOSIS — D5700 Hb-SS disease with crisis, unspecified: Principal | ICD-10-CM

## 2015-08-12 DIAGNOSIS — Z720 Tobacco use: Secondary | ICD-10-CM

## 2015-08-12 LAB — CBC
HEMATOCRIT: 23.2 % — AB (ref 36.0–46.0)
Hemoglobin: 8.3 g/dL — ABNORMAL LOW (ref 12.0–15.0)
MCH: 35 pg — AB (ref 26.0–34.0)
MCHC: 35.8 g/dL (ref 30.0–36.0)
MCV: 97.9 fL (ref 78.0–100.0)
PLATELETS: 509 10*3/uL — AB (ref 150–400)
RBC: 2.37 MIL/uL — ABNORMAL LOW (ref 3.87–5.11)
RDW: 17.6 % — AB (ref 11.5–15.5)
WBC: 13.4 10*3/uL — AB (ref 4.0–10.5)

## 2015-08-12 LAB — BASIC METABOLIC PANEL
ANION GAP: 12 (ref 5–15)
BUN: 8 mg/dL (ref 6–20)
CALCIUM: 9.3 mg/dL (ref 8.9–10.3)
CO2: 25 mmol/L (ref 22–32)
Chloride: 102 mmol/L (ref 101–111)
Creatinine, Ser: 0.95 mg/dL (ref 0.44–1.00)
GFR calc Af Amer: >60 mL/min (ref >60)
GFR calc non Af Amer: >60 mL/min (ref >60)
GLUCOSE: 86 mg/dL (ref 65–99)
Potassium: 3.8 mmol/L (ref 3.5–5.1)
Sodium: 139 mmol/L (ref 135–145)

## 2015-08-12 LAB — PHOSPHORUS: Phosphorus: 4.7 mg/dL — ABNORMAL HIGH (ref 2.5–4.6)

## 2015-08-12 LAB — RETICULOCYTES
RBC.: 2.37 MIL/uL — ABNORMAL LOW (ref 3.87–5.11)
RETIC COUNT ABSOLUTE: 429 10*3/uL — AB (ref 19.0–186.0)
Retic Ct Pct: 18.1 % — ABNORMAL HIGH (ref 0.4–3.1)

## 2015-08-12 LAB — MAGNESIUM: Magnesium: 1.9 mg/dL (ref 1.7–2.4)

## 2015-08-12 LAB — LACTATE DEHYDROGENASE: LDH: 265 U/L — AB (ref 98–192)

## 2015-08-12 MED ORDER — HYDROMORPHONE HCL 1 MG/ML IJ SOLN
1.0000 mg | INTRAMUSCULAR | Status: DC | PRN
  Administered 2015-08-12: 1 mg via INTRAVENOUS
  Filled 2015-08-12: qty 1

## 2015-08-12 MED ORDER — NICOTINE 7 MG/24HR TD PT24
7.0000 mg | MEDICATED_PATCH | Freq: Every day | TRANSDERMAL | Status: DC
  Administered 2015-08-12 – 2015-08-14 (×3): 7 mg via TRANSDERMAL
  Filled 2015-08-12 (×4): qty 1

## 2015-08-12 MED ORDER — ONDANSETRON HCL 4 MG/2ML IJ SOLN: 4.0000 mg | INTRAMUSCULAR | Status: DC | PRN

## 2015-08-12 MED ORDER — SODIUM CHLORIDE 0.45 % IV SOLN
INTRAVENOUS | Status: DC
  Administered 2015-08-12 – 2015-08-13 (×4): via INTRAVENOUS
  Administered 2015-08-13: 1000 mL via INTRAVENOUS
  Administered 2015-08-14: 11:00:00 via INTRAVENOUS
  Administered 2015-08-15: 1000 mL via INTRAVENOUS

## 2015-08-12 MED ORDER — MORPHINE SULFATE ER 15 MG PO TBCR
15.0000 mg | EXTENDED_RELEASE_TABLET | Freq: Two times a day (BID) | ORAL | Status: DC
  Administered 2015-08-12: 15 mg via ORAL
  Filled 2015-08-12: qty 1

## 2015-08-12 MED ORDER — ENOXAPARIN SODIUM 40 MG/0.4ML ~~LOC~~ SOLN
40.0000 mg | SUBCUTANEOUS | Status: DC
  Administered 2015-08-12 – 2015-08-14 (×3): 40 mg via SUBCUTANEOUS
  Filled 2015-08-12 (×4): qty 0.4

## 2015-08-12 MED ORDER — MORPHINE SULFATE ER 15 MG PO TBCR
15.0000 mg | EXTENDED_RELEASE_TABLET | Freq: Two times a day (BID) | ORAL | Status: DC
  Administered 2015-08-12 – 2015-08-13 (×4): 15 mg via ORAL
  Filled 2015-08-12 (×4): qty 1

## 2015-08-12 MED ORDER — PANTOPRAZOLE SODIUM 40 MG PO TBEC
40.0000 mg | DELAYED_RELEASE_TABLET | Freq: Every day | ORAL | Status: DC
  Administered 2015-08-12 – 2015-08-14 (×3): 40 mg via ORAL
  Filled 2015-08-12 (×4): qty 1

## 2015-08-12 MED ORDER — SODIUM CHLORIDE 0.9 % IJ SOLN: 9.0000 mL | INTRAMUSCULAR | Status: DC | PRN

## 2015-08-12 MED ORDER — PANTOPRAZOLE SODIUM 40 MG IV SOLR
40.0000 mg | Freq: Every day | INTRAVENOUS | Status: DC
  Filled 2015-08-12: qty 40

## 2015-08-12 MED ORDER — ONDANSETRON HCL 4 MG PO TABS: 4.0000 mg | ORAL_TABLET | ORAL | Status: DC | PRN

## 2015-08-12 MED ORDER — ACETAMINOPHEN 325 MG PO TABS: 650.0000 mg | ORAL_TABLET | ORAL | Status: DC | PRN

## 2015-08-12 MED ORDER — FOLIC ACID 1 MG PO TABS
1.0000 mg | ORAL_TABLET | Freq: Every day | ORAL | Status: DC
  Administered 2015-08-12 – 2015-08-14 (×3): 1 mg via ORAL
  Filled 2015-08-12 (×4): qty 1

## 2015-08-12 MED ORDER — HYDROXYZINE HCL 25 MG PO TABS
25.0000 mg | ORAL_TABLET | ORAL | Status: DC | PRN
  Administered 2015-08-12 – 2015-08-13 (×3): 25 mg via ORAL
  Administered 2015-08-14: 50 mg via ORAL
  Administered 2015-08-14: 25 mg via ORAL
  Filled 2015-08-12: qty 2
  Filled 2015-08-12 (×3): qty 1
  Filled 2015-08-12: qty 2

## 2015-08-12 MED ORDER — ALBUTEROL SULFATE (2.5 MG/3ML) 0.083% IN NEBU: 3.0000 mL | INHALATION_SOLUTION | RESPIRATORY_TRACT | Status: DC | PRN

## 2015-08-12 MED ORDER — DIPHENHYDRAMINE HCL 50 MG/ML IJ SOLN
12.5000 mg | Freq: Four times a day (QID) | INTRAMUSCULAR | Status: DC | PRN
  Administered 2015-08-12 – 2015-08-13 (×2): 12.5 mg via INTRAVENOUS
  Filled 2015-08-12 (×2): qty 1

## 2015-08-12 MED ORDER — ONDANSETRON HCL 4 MG/2ML IJ SOLN: 4.0000 mg | Freq: Four times a day (QID) | INTRAMUSCULAR | Status: DC | PRN

## 2015-08-12 MED ORDER — HYDROMORPHONE HCL 1 MG/ML IJ SOLN: 1.0000 mg | INTRAMUSCULAR | Status: DC | PRN

## 2015-08-12 MED ORDER — NALOXONE HCL 0.4 MG/ML IJ SOLN: 0.4000 mg | INTRAMUSCULAR | Status: DC | PRN

## 2015-08-12 MED ORDER — POLYETHYLENE GLYCOL 3350 17 G PO PACK
17.0000 g | PACK | Freq: Every day | ORAL | Status: DC | PRN
  Administered 2015-08-14: 17 g via ORAL
  Filled 2015-08-12: qty 1

## 2015-08-12 MED ORDER — ACETAMINOPHEN 650 MG RE SUPP: 650.0000 mg | RECTAL | Status: DC | PRN

## 2015-08-12 MED ORDER — HYDROMORPHONE HCL 1 MG/ML IJ SOLN: 0.5000 mg | INTRAMUSCULAR | Status: DC | PRN

## 2015-08-12 MED ORDER — MORPHINE SULFATE 1 MG/ML IV SOLN
INTRAVENOUS | Status: DC
  Administered 2015-08-12: 09:00:00 via INTRAVENOUS
  Administered 2015-08-12: 3 mg via INTRAVENOUS
  Administered 2015-08-12: 15 mg via INTRAVENOUS
  Administered 2015-08-12: 19:00:00 via INTRAVENOUS
  Administered 2015-08-13: 7.5 mg via INTRAVENOUS
  Administered 2015-08-13: 14.82 mg via INTRAVENOUS
  Administered 2015-08-13: 06:00:00 via INTRAVENOUS
  Administered 2015-08-13: 16.5 mg via INTRAVENOUS
  Administered 2015-08-13: 4.5 mg via INTRAVENOUS
  Filled 2015-08-12 (×3): qty 25

## 2015-08-12 MED ORDER — DIPHENHYDRAMINE HCL 12.5 MG/5ML PO ELIX
12.5000 mg | ORAL_SOLUTION | Freq: Four times a day (QID) | ORAL | Status: DC | PRN
  Filled 2015-08-12: qty 5

## 2015-08-12 MED ORDER — PNEUMOCOCCAL VAC POLYVALENT 25 MCG/0.5ML IJ INJ
0.5000 mL | INJECTION | INTRAMUSCULAR | Status: DC
  Filled 2015-08-12 (×2): qty 0.5

## 2015-08-12 MED ORDER — KETOROLAC TROMETHAMINE 15 MG/ML IJ SOLN
15.0000 mg | Freq: Four times a day (QID) | INTRAMUSCULAR | Status: DC
  Administered 2015-08-12 – 2015-08-15 (×13): 15 mg via INTRAVENOUS
  Filled 2015-08-12 (×12): qty 1

## 2015-08-12 MED ORDER — SENNOSIDES-DOCUSATE SODIUM 8.6-50 MG PO TABS
1.0000 | ORAL_TABLET | Freq: Two times a day (BID) | ORAL | Status: DC
  Administered 2015-08-12 – 2015-08-14 (×7): 1 via ORAL
  Filled 2015-08-12 (×8): qty 1

## 2015-08-12 MED ORDER — INFLUENZA VAC SPLIT QUAD 0.5 ML IM SUSY
0.5000 mL | PREFILLED_SYRINGE | INTRAMUSCULAR | Status: DC
  Filled 2015-08-12 (×2): qty 0.5

## 2015-08-12 MED FILL — Hydromorphone HCl Inj 2 MG/ML: INTRAMUSCULAR | Qty: 1 | Status: AC

## 2015-08-12 NOTE — ED Notes (Signed)
Informed patient of the updated information about [facility] and provided warms to patient.

## 2015-08-12 NOTE — Progress Notes (Signed)
Utilization review completed.  

## 2015-08-12 NOTE — Progress Notes (Signed)
Notified MD oncall that pt has arrived to unit from [facility] ED. MD stated that they could come see pt. Ranelle Oyster, RN

## 2015-08-12 NOTE — Progress Notes (Signed)
S: Admitted this am with Sickle cell pain crisis. She was started initially on dilaudid prn, but this did not control her pain. I was called to the bedside this am at which time she mentioned that her pain was so severe that she was unable to sleep. She requested additional pain control. She is not having worsening SOB, cough, chest discomfort. She says that she just wants pain control to be able to sleep. Otherwise she has no complaints.   O:  BP 107/40 P 57 O2 92% R 18 Lungs clear to auscultation.  Pain continues in arms , wrists, neck, and back.   A/P: 19 y/o F here with sickle cell pain crisis. Stable hemoglobin, vital signs. Pain uncontrolled at this time.  - On 180 morphine equivalents daily at home.  - Started on morphine pca based on the above home dose.  - Added MS Contin 15mg  BID to this.  - Discontinued Dilaudid - Continue Toradol, Tylenol.  - Continue fluids for another 12 hours.  - Follow up pain control later today.    Paula Compton, MD Family Medicine - PGY 2

## 2015-08-12 NOTE — ED Notes (Signed)
Pt transported to [facility] via [place].

## 2015-08-12 NOTE — Progress Notes (Addendum)
Additional attempt made to obtain IV access for patient. IV team order placed as STAT and IV team RN Judson Roch contacted to make aware. Dr. Minda Ditto paged because patient threatening to leave AMA if no pain meds given. Patient states 10/10 pain. Telephone order to give scheduled Morphine 12 hr now.   8:20 AM IV access obtained. Morphine for PCA pump not loaded in pyxis. Pharmacy contacted for supply of medication.

## 2015-08-12 NOTE — Progress Notes (Addendum)
Notified MD oncall that pt states that her pain is a 10 and that she wants to be transferred back to [facility]. Pt received dilaudid at 0506 and Toradol at 0546. Pt requesting more pain medication. MD stated that they would be by to see pt. No new orders given. Will continue to monitor pt. Ranelle Oyster, RN

## 2015-08-12 NOTE — ED Notes (Signed)
Spoke with [facility] about the time of arrival. ETA: 30 minutes.
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
## 2015-08-12 NOTE — ED Notes (Signed)
Gave report to [facility] with [facility].

## 2015-08-12 NOTE — Progress Notes (Signed)
Patient transferred from [facility] to [place] with sickle cell crisis. She is here for pain management. On arrival alert and oriented but easily falls asleep due to pain medicine administered to her at the [facility]. Paced on cardiac monitor and oriented to the room. Call bell placed within reach and bed alarm turned on. Made comfortable in bed. Vital signs checked see epic.

## 2015-08-12 NOTE — Progress Notes (Signed)
   Pt seen on afternoon rounds  Pt sitting up in bed about to eat breakfast. She reports her wrist pain has resolved but continues to have neck and back pain. Was previously sleeping but her back pain awoke her from sleep. Denies shortness of breath, chest pain, abdominal pain. 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 Per the nurse, she had the loading dose (3mg ) and only pushed her button twice (1.5 mg each) for a total of 6 mg morphine IV. As pt is not taking much of her PCA could consider transitioning off the PCA today, but given strong patient desire to maintain PCA for today, will anticipate d/cing PCA tomorrow 10/14 SS: continue to trend CBC, Retics, should have outpatient follow up with Hematology, recommend her following wit them as an outpatient. Consider starting hydroxyurea  Dai Mcadams A. Lincoln Brigham MD, [place] Family Medicine Resident PGY-2 Pager [phone]

## 2015-08-13 ENCOUNTER — Inpatient Hospital Stay (HOSPITAL_COMMUNITY): Payer: Medicare Other

## 2015-08-13 DIAGNOSIS — D589 Hereditary hemolytic anemia, unspecified: Secondary | ICD-10-CM | POA: Insufficient documentation

## 2015-08-13 DIAGNOSIS — R0989 Other specified symptoms and signs involving the circulatory and respiratory systems: Secondary | ICD-10-CM

## 2015-08-13 DIAGNOSIS — I361 Nonrheumatic tricuspid (valve) insufficiency: Secondary | ICD-10-CM

## 2015-08-13 LAB — RAPID URINE DRUG SCREEN, HOSP PERFORMED
AMPHETAMINES: NOT DETECTED
BARBITURATES: NOT DETECTED
Benzodiazepines: NOT DETECTED
Cocaine: NOT DETECTED
OPIATES: POSITIVE — AB
TETRAHYDROCANNABINOL: POSITIVE — AB

## 2015-08-13 LAB — CBC
HCT: 20.1 % — ABNORMAL LOW (ref 36.0–46.0)
HEMATOCRIT: 30.4 % — AB (ref 36.0–46.0)
HEMOGLOBIN: 8.9 g/dL — AB (ref 12.0–15.0)
Hemoglobin: 7.4 g/dL — ABNORMAL LOW (ref 12.0–15.0)
MCH: 35.9 pg — AB (ref 26.0–34.0)
MCH: 23.5 pg — ABNORMAL LOW (ref 26.0–34.0)
MCHC: 36.8 g/dL — AB (ref 30.0–36.0)
MCHC: 29.3 g/dL — AB (ref 30.0–36.0)
MCV: 97.6 fL (ref 78.0–100.0)
MCV: 80.2 fL (ref 78.0–100.0)
PLATELETS: 483 10*3/uL — AB (ref 150–400)
Platelets: 306 10*3/uL (ref 150–400)
RBC: 2.06 MIL/uL — ABNORMAL LOW (ref 3.87–5.11)
RBC: 3.79 MIL/uL — ABNORMAL LOW (ref 3.87–5.11)
RDW: 17.9 % — AB (ref 11.5–15.5)
RDW: 18.9 % — AB (ref 11.5–15.5)
WBC: 11.7 10*3/uL — ABNORMAL HIGH (ref 4.0–10.5)
WBC: 6.9 10*3/uL (ref 4.0–10.5)

## 2015-08-13 LAB — RETICULOCYTES
RBC.: 2.06 MIL/uL — ABNORMAL LOW (ref 3.87–5.11)
Retic Count, Absolute: 358.4 10*3/uL — ABNORMAL HIGH (ref 19.0–186.0)
Retic Ct Pct: 17.4 % — ABNORMAL HIGH (ref 0.4–3.1)

## 2015-08-13 LAB — PREPARE RBC (CROSSMATCH)

## 2015-08-13 MED ORDER — SODIUM CHLORIDE 0.9 % IJ SOLN: 9.0000 mL | INTRAMUSCULAR | Status: DC | PRN

## 2015-08-13 MED ORDER — HYDROMORPHONE 0.3 MG/ML IV SOLN
INTRAVENOUS | Status: DC
  Administered 2015-08-13: 0.9 mg via INTRAVENOUS
  Administered 2015-08-13: 2.1 mg via INTRAVENOUS
  Administered 2015-08-13: via INTRAVENOUS
  Administered 2015-08-13: 0.3 mg via INTRAVENOUS
  Administered 2015-08-14: 2.7 mg via INTRAVENOUS
  Administered 2015-08-14: 0.3 mg via INTRAVENOUS
  Administered 2015-08-14: 7.5 mg via INTRAVENOUS
  Administered 2015-08-14: 1.8 mg via INTRAVENOUS
  Administered 2015-08-14: 2.7 mg via INTRAVENOUS
  Administered 2015-08-14: 1.2 mg via INTRAVENOUS
  Administered 2015-08-15: 11:00:00 via INTRAVENOUS
  Administered 2015-08-15: 3.6 mg via INTRAVENOUS
  Filled 2015-08-13 (×5): qty 25

## 2015-08-13 MED ORDER — ONDANSETRON HCL 4 MG/2ML IJ SOLN
4.0000 mg | Freq: Four times a day (QID) | INTRAMUSCULAR | Status: DC | PRN
  Administered 2015-08-15: 4 mg via INTRAVENOUS
  Filled 2015-08-13: qty 2

## 2015-08-13 MED ORDER — ACETAMINOPHEN 325 MG PO TABS
650.0000 mg | ORAL_TABLET | Freq: Once | ORAL | Status: AC
  Administered 2015-08-13: 650 mg via ORAL
  Filled 2015-08-13: qty 2

## 2015-08-13 MED ORDER — SODIUM CHLORIDE 0.9 % IV SOLN
Freq: Once | INTRAVENOUS | Status: AC
  Administered 2015-08-13: 250 mL via INTRAVENOUS

## 2015-08-13 MED ORDER — NALOXONE HCL 0.4 MG/ML IJ SOLN: 0.4000 mg | INTRAMUSCULAR | Status: DC | PRN

## 2015-08-13 MED ORDER — SODIUM CHLORIDE 0.9 % IV SOLN: Freq: Once | INTRAVENOUS | Status: DC

## 2015-08-13 MED ORDER — DIPHENHYDRAMINE HCL 25 MG PO CAPS
25.0000 mg | ORAL_CAPSULE | Freq: Once | ORAL | Status: AC
  Administered 2015-08-13: 25 mg via ORAL
  Filled 2015-08-13: qty 1

## 2015-08-13 NOTE — Progress Notes (Signed)
*  PRELIMINARY RESULTS* Echocardiogram 2D Echocardiogram has been performed.  Leavy Cella 08/13/2015, 11:26 AM

## 2015-08-13 NOTE — Progress Notes (Signed)
Family Medicine Teaching Service Daily Progress Note Intern Pager: [phone]  Patient name: [name] Medical record number: [id number] Date of birth: [birth year] Age: 19 y.o. Gender: female  Primary Care Provider: Dimas Chyle, MD Consultants: none Code Status: full  Pt Overview and Major Events to Date:  10/13-admitted with pain crisis  Assessment and Plan: [name] is a 19 y.o. female presenting with Sickle Cell Pain Crisis . PMH is significant for Sickle Cell Disease SS, tobacco abuse, mild intermittent asthma.   # Pain Crisis/SCD - s/p splenectomy. No hematologist. Hemoglobin 9 (on admission)>7.4 (b/l 8-9). Retic 23% >>17%. LDH 265. WBC 11. Bilirubin 5.5 on presentation. CXR negative for infiltrate. Afebrile. EKG without acute changes. Pain is mostly her typical sickle cell pain. Eye with some sclera icterus. Lung exams with some crackles over left lower lung region.  - On 180 morphine equivalents daily at home.  - On morphine pca. May consider switching to Dilaudid PCA as that has helped with pain better. - Added MS Contin 15mg  BID to this.  - Continue Toradol, Tylenol.  - Continue fluids. 1/2NS at 100 ml/hr - Will consider transfusion (Hgb about 10-20% down from baseline. Elevated retic count) - outpatient follow up with hematology. May benefit from hydroxyurea.  - echocardiogram today  # Asthma - mild persistent. Well controlled. Using albuterol 1 time per week. Says she is on a steroid inhaler at home but unclear which one. No SOB or wheezing here. - Albuterol prn.   # Tobacco  - nicotine replacement.   # Misuse meds: use her aunt's Roxycodone whenever she is out of her own medications. Poor follow up with PCP. - UDS-hx of cocaine and marijuana use. - Reinforced appropriate use of medications.   FEN/GI: Regular diet / MIVF Prophylaxis: Lovenox.   Disposition: floor. Pain management, daily Hgb and retic. Possible transfusion  Subjective:   Patient reports chest pain (left sided), sharp in nature, consistent, non-radiated. She also endorses left lower back pain and ankle pains. Pain intensity 9/10, unchanged from when she came in. Denies shortness of breath or neurologic deficit. She has a throbbing headache, frontal and bilateral that has been there since yesterday. Denies vision changes. Says dilaudid has helped with pain better when she was at [facility].   Objective: Temp:  [97.8 F (36.6 C)-98.9 F (37.2 C)] 98.9 F (37.2 C) (10/14 0829) Pulse Rate:  [58-95] 58 (10/14 0829) Resp:  [9-22] 20 (10/14 0904) BP: (90-97)/(46-58) 92/46 mmHg (10/14 0829) SpO2:  [92 %-100 %] 100 % (10/14 0904) FiO2 (%):  [99 %-100 %] 100 % (10/14 0904) Physical Exam: Gen: appears tired and in distress from pain, able to sit up in bed. 2L O2 by [place]  Eyes: PERRLA, sclera icterus Oropharynx: clear, moist CV: RRR. S1 & S2 audible, SEM over right and left USB and left LSB Resp: no apparent WOB, some crackles over left lower lung regions. Abd: +BS. Soft, NDNT, no rebound or guarding or hepatomegally.  Ext: No edema or gross deformities. Neuro: Alert and oriented, No gross focal deficits  Laboratory:  Recent Labs Lab 08/11/15 1930 08/12/15 0353 08/13/15 0537  WBC 11.0* 13.4* 11.7*  HGB 9.0* 8.3* 7.4*  HCT 24.6* 23.2* 20.1*  PLT 586* 509* 483*    Recent Labs Lab 08/11/15 1925 08/11/15 1930 08/12/15 0353  NA  --  137 139  K  --  4.3 3.8  CL  --  106 102  CO2  --  26 25  BUN  --  7 8  CREATININE  --  0.75 0.95  CALCIUM  --  9.1 9.3  PROT 7.6  --   --   BILITOT 5.5*  --   --   ALKPHOS 49  --   --   ALT 24  --   --   AST 28  --   --   GLUCOSE  --  76 86   Imaging/Diagnostic Tests:  Mercy Riding, MD 08/13/2015, 9:30 AM PGY-1, [place] Intern pager: [phone], text pages welcome

## 2015-08-14 LAB — CBC
HEMATOCRIT: 22.2 % — AB (ref 36.0–46.0)
Hemoglobin: 8.1 g/dL — ABNORMAL LOW (ref 12.0–15.0)
MCH: 34 pg (ref 26.0–34.0)
MCHC: 36.5 g/dL — ABNORMAL HIGH (ref 30.0–36.0)
MCV: 93.3 fL (ref 78.0–100.0)
PLATELETS: 459 10*3/uL — AB (ref 150–400)
RBC: 2.38 MIL/uL — AB (ref 3.87–5.11)
RDW: 19 % — ABNORMAL HIGH (ref 11.5–15.5)
WBC: 11.4 10*3/uL — ABNORMAL HIGH (ref 4.0–10.5)

## 2015-08-14 LAB — RETICULOCYTES
RBC.: 2.38 MIL/uL — ABNORMAL LOW (ref 3.87–5.11)
Retic Count, Absolute: 342.7 10*3/uL — ABNORMAL HIGH (ref 19.0–186.0)
Retic Ct Pct: 14.4 % — ABNORMAL HIGH (ref 0.4–3.1)

## 2015-08-14 MED ORDER — MORPHINE SULFATE ER 30 MG PO TBCR
30.0000 mg | EXTENDED_RELEASE_TABLET | Freq: Two times a day (BID) | ORAL | Status: DC
  Administered 2015-08-14 – 2015-08-15 (×3): 30 mg via ORAL
  Filled 2015-08-14 (×3): qty 1

## 2015-08-14 NOTE — Progress Notes (Signed)
Family Medicine Teaching Service Daily Progress Note Intern Pager: [phone]  Patient name: [name] Medical record number: [id number] Date of birth: 06-Apr-1996 Age: 19 y.o. Gender: female  Primary Care Provider: Dimas Chyle, MD Consultants: none Code Status: full  Pt Overview and Major Events to Date:  10/13-admitted with pain crisis 10/14: Hgb 7.4. s/p 1u pRBCs   Assessment and Plan: [name] is a 19 y.o. female presenting with Sickle Cell Pain Crisis . PMH is significant for Sickle Cell Disease SS, tobacco abuse, mild intermittent asthma.   # Pain Crisis/SCD - s/p splenectomy. No hematologist. Hemoglobin 9 (on admission)>7.4 (b/l 8-9). Retic 23% >>17%. S/p 1upRBCs with improvement to 8.9. Repeat Hgb 8.1 this AM.  CXR negative for infiltrate. Afebrile. EKG without acute changes. Lung exams with some crackles over left lower lung region. Echo with normal EF with a PA pressure of 23mm H - On 180 morphine equivalents daily at home.  - On Dilaudid PCA, maxing out on frequency. - Increased MS Contin to 30mg  BID today.  - Continue Toradol, Tylenol.  - Continue fluids. 1/2NS at 100 ml/hr - repeat CBC and retic in AM - outpatient follow up with hematology. May benefit from hydroxyurea.   # Asthma - mild persistent. Well controlled. Using albuterol 1 time per week. Says she is on a steroid inhaler at home but unclear which one. No SOB or wheezing here. - Albuterol prn.   # Tobacco  - nicotine replacement.   # Misuse meds: use her aunt's Roxycodone whenever she is out of her own medications. Poor follow up with PCP. - UDS-positive for marijuana. - Reinforced appropriate use of medications.   FEN/GI: Regular diet / MIVF Prophylaxis: Lovenox.   Disposition: floor. Pain management, daily Hgb and retic.   Subjective:  Patient reports chest pain (left sided), sharp in nature, consistent, non-radiating that is unchanged from admission. Also notes left  lumbar back pain that is stable. No fevers, SOB, or cough.  No dysuria, urinary frequency, or urgency. Crying and asking to go home as she's frustrated by racist nurses. Cannot tell me any particular thing that was racist but notes she felt disrespected. Asks for only black nurses. I state cannot accommodate that but that I can follow up with staff to insure she's treated respectfully.   Objective: Temp:  [98.1 F (36.7 C)-98.9 F (37.2 C)] 98.1 F (36.7 C) (10/15 0450) Pulse Rate:  [54-67] 54 (10/15 0450) Resp:  [12-20] 20 (10/15 0450) BP: (92-109)/(35-62) 106/51 mmHg (10/15 0450) SpO2:  [98 %-100 %] 99 % (10/15 0450) FiO2 (%):  [100 %] 100 % (10/14 2016) Weight:  [146 lb 9.7 oz (66.5 kg)] 146 lb 9.7 oz (66.5 kg) (10/14 1415) Physical Exam: Gen: appears upset and crying, able to sit up in bed. Eyes: PERRLA, sclera icterus Oropharynx: clear, moist CV: RRR. S1 & S2 audible, systolic ejection murmur.  Resp: no apparent WOB, some crackles over left lower lung regions. Abd: +BS. Soft, NDNT, no rebound or guarding or hepatomegally.  Ext: No edema or gross deformities. Neuro: Alert and oriented, No gross focal deficits  Laboratory:  Recent Labs Lab 08/13/15 0537 08/13/15 1900 08/14/15 0542  WBC 11.7* 6.9 11.4*  HGB 7.4* 8.9* 8.1*  HCT 20.1* 30.4* 22.2*  PLT 483* 306 459*    Recent Labs Lab 08/11/15 1925 08/11/15 1930 08/12/15 0353  NA  --  137 139  K  --  4.3 3.8  CL  --  106 102  CO2  --  26 25  BUN  --  7 8  CREATININE  --  0.75 0.95  CALCIUM  --  9.1 9.3  PROT 7.6  --   --   BILITOT 5.5*  --   --   ALKPHOS 49  --   --   ALT 24  --   --   AST 28  --   --   GLUCOSE  --  76 86  Retic %: 18.1> 17.4> 14.4  UDS: positive for opiates and THC   Imaging/Diagnostic Tests: CXR: No active cardiopulmonary disease.  Echo: EF 55-60%, no RWMA, normal left ventricular diastolic function. PA peak pressure 38mm Hg  Archie Patten, MD 08/14/2015, 8:07 AM PGY-2, [facility] Intern pager: [phone], text pages welcome

## 2015-08-14 NOTE — Progress Notes (Signed)
0845:  Pt requested IV to be taken out.  Pt has two IV sites, but informed pt that she needs to keep the second site because she had received blood and that there is the potential for her to receive more.  Pt stated "Well I want to talk to my doctor."  Informed pt that this nurse can take the IV out, but she may need another one if blood is required while she was on the PCA because blood can not be given with anything else.  Pt verbally agreed.  Informed pt that this nurse will have to grab gauze and will be back soon to remove IV.  0383:  Received phone call from nurse tech that pt was attempting to remove IV herself.  Informed nurse tech to tell her to stop, that this was assisting a pt in another room and will be there after pt is settled and to call the charge nurse.   0920: Entered pt's room and pt stated "This IV is does not even have anything running through it."  Informed her firmly that there are two accesses incase you need blood, but it will be taken out now.  Pt then became upset and stated "I don't want you to touch me, I want another nurse."  This nurse talked to charge nurse, Ginger G., after leaving room and informed her of the situation between this nurse and the pt.  Asked another nurse to remove IV.  [phone]: Was told by secretary that pt wanted to talk to the doctor and leave.  Paged family medicine service and talked to Dr. Lorenso Courier.  Dr. Lorenso Courier had seen pt shortly after incident and informed pt that she needs to be transitioned to oral medications before being discharged.  Will continue to monitor.

## 2015-08-15 LAB — RETICULOCYTES
RBC.: 2.26 MIL/uL — AB (ref 3.87–5.11)
RETIC CT PCT: 13.9 % — AB (ref 0.4–3.1)
Retic Count, Absolute: 314.1 10*3/uL — ABNORMAL HIGH (ref 19.0–186.0)

## 2015-08-15 LAB — CBC
HCT: 20.8 % — ABNORMAL LOW (ref 36.0–46.0)
HEMOGLOBIN: 7.6 g/dL — AB (ref 12.0–15.0)
MCH: 33.6 pg (ref 26.0–34.0)
MCHC: 36.5 g/dL — ABNORMAL HIGH (ref 30.0–36.0)
MCV: 92 fL (ref 78.0–100.0)
PLATELETS: 434 10*3/uL — AB (ref 150–400)
RBC: 2.26 MIL/uL — AB (ref 3.87–5.11)
RDW: 18.6 % — ABNORMAL HIGH (ref 11.5–15.5)
WBC: 11.7 10*3/uL — AB (ref 4.0–10.5)

## 2015-08-15 LAB — CBC WITH DIFFERENTIAL/PLATELET
BASOS ABS: 0 10*3/uL (ref 0.0–0.1)
Basophils Relative: 0 %
EOS ABS: 0.1 10*3/uL (ref 0.0–0.7)
EOS PCT: 1 %
HCT: 26.6 % — ABNORMAL LOW (ref 36.0–46.0)
Hemoglobin: 9.5 g/dL — ABNORMAL LOW (ref 12.0–15.0)
LYMPHS PCT: 30 %
Lymphs Abs: 3.3 10*3/uL (ref 0.7–4.0)
MCH: 32.1 pg (ref 26.0–34.0)
MCHC: 35.7 g/dL (ref 30.0–36.0)
MCV: 89.9 fL (ref 78.0–100.0)
MONO ABS: 1.1 10*3/uL — AB (ref 0.1–1.0)
Monocytes Relative: 10 %
Neutro Abs: 6.6 10*3/uL (ref 1.7–7.7)
Neutrophils Relative %: 59 %
PLATELETS: 477 10*3/uL — AB (ref 150–400)
RBC: 2.96 MIL/uL — AB (ref 3.87–5.11)
RDW: 18.9 % — AB (ref 11.5–15.5)
WBC: 11.1 10*3/uL — ABNORMAL HIGH (ref 4.0–10.5)

## 2015-08-15 LAB — PREPARE RBC (CROSSMATCH)

## 2015-08-15 MED ORDER — SODIUM CHLORIDE 0.9 % IV SOLN
Freq: Once | INTRAVENOUS | Status: AC
Start: 2015-08-15 — End: 2015-08-15
  Administered 2015-08-15: 12:00:00 via INTRAVENOUS

## 2015-08-15 MED ORDER — SENNOSIDES-DOCUSATE SODIUM 8.6-50 MG PO TABS: 1.0000 | ORAL_TABLET | Freq: Two times a day (BID) | ORAL | Status: DC

## 2015-08-15 MED ORDER — OXYCODONE HCL 5 MG PO TABS: 5.0000 mg | ORAL_TABLET | ORAL | Status: DC | PRN

## 2015-08-15 MED ORDER — OXYCODONE HCL ER 15 MG PO T12A: 30.0000 mg | EXTENDED_RELEASE_TABLET | Freq: Two times a day (BID) | ORAL | Status: DC

## 2015-08-15 MED ORDER — OXYCODONE HCL ER 30 MG PO T12A: 30.0000 mg | EXTENDED_RELEASE_TABLET | Freq: Two times a day (BID) | ORAL | Status: DC

## 2015-08-15 NOTE — Progress Notes (Signed)
Informed by telemetry that pt could not be seen on heart monitor.  Was called by nursing staff that patient had a ride that would be here at 13:30.  At this time security had been called to the floor because of prior incident.  Security followed this nurse into the patient's room.  Security introduced himself.  Pt was sitting in the bed and asked for security to leave.  Pt stated "I don't want him here."  Security politely stepped out of the room, but remained in the hallway.  Informed pt that security was called because this nurse and the charge nurse felt threatened.  Pt stated "Well what about me?  I felt threatened too."  Asked what staff had done to make her feel threatened.  Pt mumbled "It's racist.  I'm going to call the police since I feel threatened."  Pt asked for this nurse's name.  Asked pt if she was agreeable to get blood.  Pt stated "Well why are you standing here?  You could have gotten it already!"  Educated pt that vital signs will need to be taken prior to administration.  Pt agreeable.  Will continue to monitor.

## 2015-08-15 NOTE — Progress Notes (Signed)
Family Medicine Teaching Service Daily Progress Note Intern Pager: [phone]  Patient name: [name] Medical record number: [id number] Date of birth: 01/09/1996 Age: 19 y.o. Gender: female  Primary Care Provider: Dimas Chyle, MD Consultants: none Code Status: full  Pt Overview and Major Events to Date:  10/13-admitted with pain crisis 10/14: Hgb 7.4. s/p 1u pRBCs   Assessment and Plan: [name] is a 19 y.o. female presenting with Sickle Cell Pain Crisis . PMH is significant for Sickle Cell Disease SS, tobacco abuse, mild intermittent asthma.   # Pain Crisis/SCD - Retics 13.9<-- 14.4<--17.4, Hgb 7.6 <--8.1  - s/p 1 transfusion pRBCs, will transfuse again 1 upRBCs today 10/16. As retic count is improving hemolysis is likely resolving ongoing given decrease in Hgb, follow post transfusion Hct - On 180 morphine equivalents daily at home.  - On Dilaudid PCA, with high demand dosing. However, will transition to oral per patient request - Increased MS Contin to 30mg  BID 10/15 - Continue Toradol, Tylenol.  - D/c fluids this afternoon 10/16 - repeat CBC and retic in AM - outpatient follow up with hematology. May benefit from hydroxyurea.   # Asthma - Stable - Albuterol prn.   # Tobacco  - nicotine patch 7 mg  # Misuse meds: uses her aunt's Roxycodone whenever she is out of her own medications. Poor follow up with PCP. - UDS-positive for marijuana. - Reinforced appropriate use of medications.   FEN/GI: Regular diet / MIVF Prophylaxis: Lovenox.   Disposition: Pt strongly desires to go home today, will oralize pain regimen, transfuse blood and if stable plan to d/c this afternoon 10/16  Subjective:   Reports leg and back pain are now " manageable" and improved from when she presented. She states taht she just wants to go home. Denies SOB, chest pain, nausea. Has been able to tolerate PO Objective: Temp:  [98.5 F (36.9 C)] 98.5 F (36.9 C) (10/16  0530) Pulse Rate:  [64-95] 95 (10/16 0530) Resp:  [16-18] 17 (10/16 0530) BP: (105-109)/(43-57) 106/43 mmHg (10/16 0530) SpO2:  [94 %-99 %] 94 % (10/16 0530) Physical Exam: Gen: NAD sitting in bed HEENT: stable scleral icterus CV: RRR, systolic ejection murmur.  Resp: normal WOB, mild crackles at bases else CTAB Abd: soft, non tender +BS Ext: No edema or gross deformities. Neuro: Alert and oriented, No gross focal deficits  Laboratory:  Recent Labs Lab 08/13/15 1900 08/14/15 0542 08/15/15 0520  WBC 6.9 11.4* 11.7*  HGB 8.9* 8.1* 7.6*  HCT 30.4* 22.2* 20.8*  PLT 306 459* 434*    Recent Labs Lab 08/11/15 1925 08/11/15 1930 08/12/15 0353  NA  --  137 139  K  --  4.3 3.8  CL  --  106 102  CO2  --  26 25  BUN  --  7 8  CREATININE  --  0.75 0.95  CALCIUM  --  9.1 9.3  PROT 7.6  --   --   BILITOT 5.5*  --   --   ALKPHOS 49  --   --   ALT 24  --   --   AST 28  --   --   GLUCOSE  --  76 86  Retic %: 18.1> 17.4> 14.4  UDS: positive for opiates and THC   Imaging/Diagnostic Tests: CXR: No active cardiopulmonary disease.  Echo: EF 55-60%, no RWMA, normal left ventricular diastolic function. PA peak pressure 61mm Hg  Veatrice Bourbon, MD 08/15/2015, 8:29 AM PGY-2, [facility]  [facility] Intern pager: [phone], text pages welcome

## 2015-08-15 NOTE — Discharge Instructions (Signed)
° °  Follow-up Information    Follow up with Dimas Chyle, MD On 08/20/2015.   Specialty:  Family Medicine   Why:  3:45   Contact information:   [address] [place] [address] [phone]

## 2015-08-15 NOTE — Progress Notes (Signed)
34ml of dilaudid syringe wasted in sink with Ginger, RN.

## 2015-08-15 NOTE — Discharge Summary (Signed)
[facility] Discharge Summary  Patient name: [name] Medical record number: [id number] Date of birth: 02/12/1996 Age: 19 y.o. Gender: female Date of Admission: 08/11/2015  Date of Discharge: 08/15/2015  Admitting Physician: Lupita Dawn, MD  Primary Care Provider: Dimas Chyle, MD Consultants: None  Indication for Hospitalization:  Sickle Cell crisis  Discharge Diagnoses/Problem List:  Patient Active Problem List   Diagnosis Date Noted  . Bibasilar crackles   . 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 Acute sickle cell crisis ([place]) 08/11/2015  . 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 Suicide ideation 03/11/2015  . Psychomotor agitation 03/11/2015  . Sickle cell anemia with pain ([place]) 03/10/2015  . Vasoocclusive sickle cell crisis ([place])   . Hyperbilirubinemia 02/26/2015  . Drug-induced pruritus 02/26/2015  . Sickle cell anemia with crisis ([place]) 02/25/2015  . Tobacco abuse   . Sickle cell crisis ([place])   . Leukocytosis   . Hematochezia 02/27/2014  . UTI (lower urinary tract infection) 02/26/2014  . Vaginal discharge 02/24/2014  . Sickle cell pain crisis ([place]) 11/23/2013  . Facial rash 03/25/2013  . Birth control counseling 08/14/2012  . Asthma, mild intermittent 12/14/2010  . Hb-SS disease with crisis ([place]) 10/12/2009     Disposition: Home  Discharge Condition: Stable, patient left AMA  Discharge Exam:  Unable to perform prior to patient leaving [name] Hospital Course:  [name] is a 19 y.o. female presenting with Sickle Cell Pain Crisis . PMH is significant for Sickle Cell Disease SS, tobacco abuse, mild intermittent asthma.   Sickle cell crisis - On admission patient was noted to be complaining and was started on a morphine PCA as well as  1MS contin 5 mg BID and scheduled IV toraldol. Her pain remained difficult to control and she was transitioned to dilaudid PCA with increase in MS  contin to 30 mg BID. Her Hgb was noted to trend down to 7.4 from 9.0 on admission. She was transfused one unit pRBCs. Her reticulocytes were initially elevated to 18.1 but trended down durnig her admission. On day of leaving her hgb was noted to decreases to 7.6 and another unit of pRBCs were recommended, By that time she began to grow irritable, verbally abusing her nurses by calling them racists and threatening to hit them Security was called and she stopped making threats. She initially wanted to leave but was still on PCA. After speaking to pharmacy and reviewing her narcotic administration for the previous 24 hours, the decision was reached to transition her to oral oxycontin 30 mg BID and oxycodone 5mg  q4 PRN She received her blood transfusion and when it was over and her had her post transfusion Hct, she demanded to leave prior to her discharge being completed. She signed [facility] paperwork. She left before her scripts could be signed.    Issues for Follow Up:  1. Pain control  Significant Procedures:  Blood transfusion x2  Significant Labs and Imaging:   Recent Labs Lab 08/14/15 0542 08/15/15 0520 08/15/15 1500  WBC 11.4* 11.7* 11.1*  HGB 8.1* 7.6* 9.5*  HCT 22.2* 20.8* 26.6*  PLT 459* 434* 477*    Recent Labs Lab 08/11/15 1925 08/11/15 1930 08/12/15 0353  NA  --  137 139  K  --  4.3 3.8  CL  --  106 102  CO2  --  26 25  GLUCOSE  --  76 86  BUN  --  7 8  CREATININE  --  0.75 0.95  CALCIUM  --  9.1 9.3  MG  --   --  1.9  PHOS  --   --  4.7*  ALKPHOS 49  --   --   AST 28  --   --   ALT 24  --   --   ALBUMIN 4.2  --   --       Results/Tests Pending at Time of Discharge: None  Discharge Medications:    Medication List    STOP taking these medications        morphine 15 MG 12 hr tablet  Commonly known as:  MS CONTIN     ondansetron 4 MG tablet  Commonly known as:  ZOFRAN     oxyCODONE-acetaminophen 5-325 MG tablet  Commonly known as:  PERCOCET/ROXICET       TAKE these medications        diphenhydramine-acetaminophen 25-500 MG Tabs tablet  Commonly known as:  TYLENOL PM  Take 2 tablets by mouth at bedtime as needed (pain).     folic acid 1 MG tablet  Commonly known as:  FOLVITE  Take 1 tablet (1 mg total) by mouth daily.     hydrocortisone 1 % lotion  Apply 1 application topically 2 (two) times daily.     ibuprofen 200 MG tablet  Commonly known as:  ADVIL,MOTRIN  Take 1,000 mg by mouth every 6 (six) hours as needed for headache or moderate pain.     ondansetron 4 MG disintegrating tablet  Commonly known as:  ZOFRAN ODT  Take 1 tablet (4 mg total) by mouth every 8 (eight) hours as needed for nausea or vomiting.     oxyCODONE 5 MG immediate release tablet  Commonly known as:  Oxy IR/ROXICODONE  Take 1 tablet (5 mg total) by mouth every 4 (four) hours as needed for moderate pain or severe pain.     OxyCODONE HCl ER 30 MG T12a  Take 30 mg by mouth every 12 (twelve) hours.     senna-docusate 8.6-50 MG tablet  Commonly known as:  Senokot-S  Take 1 tablet by mouth 2 (two) times daily.        Discharge Instructions:  Pateint left before discharge information could be relayed to her  Follow-Up Appointments:     Follow-up Information    Follow up with Dimas Chyle, MD On 08/20/2015.   Specialty:  Family Medicine   Why:  3:45   Contact information:   [address] [place] [address] [phone]       Veatrice Bourbon, MD 08/15/2015, 10:11 PM PGY-2, [facility]

## 2015-08-15 NOTE — Progress Notes (Addendum)
Blood completed.  Asked lab to draw blood work before patient leaves.  Made Dr. Lincoln Brigham aware.  Informed pt that discharge order and prescriptions have not been printed.   Pt stated "You all have been wasting my time.  You could have done that a long time ago.  I'm not waiting for that."  Gave bus passes to pt.  PIV removed.  AMA paperwork signed.  Pt left with friend.

## 2015-08-15 NOTE — Progress Notes (Signed)
Entered patient's room to replace PCA since PCA was empty.  Charge nurse, Ginger, followed behind and asked pt how she was feeling.  Pt began to yell "Don't talk to me!  I have been waiting for this fucking blood all day and it still isn't here!"  Ginger, RN verbalized understanding.  Pt then spoke to charge nurse and charge nurse acknowledged staying "So, you are ready to talk now?."  Pt began to yell again "Get out of my room!  I don't need you in here!  Get the fuck out of here!"  Charge nurse attempted to de-escalate by informing pt that a dual sign-off is needed on the PCA and that two nurses need to be present.  Pt became more agitated and stated "I don't need you!  Where is my blood then?  Why haven't you started my blood?  I have been doing everything you ask me to do and you can't do your job?  I wanted to go home today!  I wanted to go to church."  Pt started to cry.  Ginger stepped out of the room, while this nurse stayed to continue setting up PCA.  Pt then shouted "You all don't know me!  I will fucking hurt you!"  Attempted to de-escalate patient's behavior by telling her that we will start the blood next.  Pt said "Don't talk to me!  I will hurt you too!"  Informed patient that kind of behavior is not tolerated in the hospital and she could go to jail.  Pt then attempted to remove blood band and stated "I am going to take these IV's out and leave." Informed pt that if the blood arm band is removed, the pt can not get the blood.  Pt appeared a little relaxed, but stated "I want you to leave."  Informed pt that I will give her some time to relax and stepped out.  Will continue to monitor.

## 2015-08-16 LAB — TYPE AND SCREEN
ABO/RH(D): B POS
Antibody Screen: NEGATIVE
UNIT DIVISION: 0
UNIT DIVISION: 0

## 2015-08-20 ENCOUNTER — Ambulatory Visit: Payer: Medicare Other | Admitting: Family Medicine

## 2015-11-04 ENCOUNTER — Emergency Department (HOSPITAL_COMMUNITY): Payer: Medicare Other

## 2015-11-04 ENCOUNTER — Encounter (HOSPITAL_COMMUNITY): Payer: Self-pay | Admitting: Emergency Medicine

## 2015-11-04 ENCOUNTER — Inpatient Hospital Stay (HOSPITAL_COMMUNITY)
Admission: EM | Admit: 2015-11-04 | Discharge: 2015-11-08 | DRG: 812 | Payer: Medicare Other | Attending: Family Medicine | Admitting: Family Medicine

## 2015-11-04 DIAGNOSIS — F17211 Nicotine dependence, cigarettes, in remission: Secondary | ICD-10-CM | POA: Diagnosis present

## 2015-11-04 DIAGNOSIS — D5700 Hb-SS disease with crisis, unspecified: Secondary | ICD-10-CM | POA: Diagnosis present

## 2015-11-04 DIAGNOSIS — J4530 Mild persistent asthma, uncomplicated: Secondary | ICD-10-CM | POA: Diagnosis present

## 2015-11-04 DIAGNOSIS — Z9081 Acquired absence of spleen: Secondary | ICD-10-CM | POA: Diagnosis not present

## 2015-11-04 LAB — CBC WITH DIFFERENTIAL/PLATELET
BASOS PCT: 0 %
Basophils Absolute: 0 10*3/uL (ref 0.0–0.1)
EOS ABS: 0.1 10*3/uL (ref 0.0–0.7)
EOS PCT: 1 %
HEMATOCRIT: 27.2 % — AB (ref 36.0–46.0)
Hemoglobin: 9.8 g/dL — ABNORMAL LOW (ref 12.0–15.0)
LYMPHS ABS: 4.4 10*3/uL — AB (ref 0.7–4.0)
Lymphocytes Relative: 45 %
MCH: 37 pg — ABNORMAL HIGH (ref 26.0–34.0)
MCHC: 36 g/dL (ref 30.0–36.0)
MCV: 102.6 fL — AB (ref 78.0–100.0)
MONO ABS: 1.1 10*3/uL — AB (ref 0.1–1.0)
Monocytes Relative: 11 %
Neutro Abs: 4.2 10*3/uL (ref 1.7–7.7)
Neutrophils Relative %: 43 %
PLATELETS: 514 10*3/uL — AB (ref 150–400)
RBC: 2.65 MIL/uL — AB (ref 3.87–5.11)
RDW: 24.6 % — AB (ref 11.5–15.5)
WBC: 9.8 10*3/uL (ref 4.0–10.5)

## 2015-11-04 LAB — URINALYSIS, ROUTINE W REFLEX MICROSCOPIC
Bilirubin Urine: NEGATIVE
GLUCOSE, UA: NEGATIVE mg/dL
Ketones, ur: NEGATIVE mg/dL
LEUKOCYTES UA: NEGATIVE
Nitrite: NEGATIVE
PH: 5.5 (ref 5.0–8.0)
Protein, ur: NEGATIVE mg/dL
SPECIFIC GRAVITY, URINE: 1.008 (ref 1.005–1.030)

## 2015-11-04 LAB — COMPREHENSIVE METABOLIC PANEL
ALK PHOS: 77 U/L (ref 38–126)
ALT: 34 U/L (ref 14–54)
AST: 45 U/L — AB (ref 15–41)
Albumin: 4.7 g/dL (ref 3.5–5.0)
Anion gap: 9 (ref 5–15)
BUN: 7 mg/dL (ref 6–20)
CALCIUM: 9.5 mg/dL (ref 8.9–10.3)
CO2: 23 mmol/L (ref 22–32)
CREATININE: 0.42 mg/dL — AB (ref 0.44–1.00)
Chloride: 107 mmol/L (ref 101–111)
Glucose, Bld: 98 mg/dL (ref 65–99)
Potassium: 4.4 mmol/L (ref 3.5–5.1)
Sodium: 139 mmol/L (ref 135–145)
Total Bilirubin: 5.5 mg/dL — ABNORMAL HIGH (ref 0.3–1.2)
Total Protein: 7.8 g/dL (ref 6.5–8.1)

## 2015-11-04 LAB — RETICULOCYTES
RBC.: 2.64 MIL/uL — ABNORMAL LOW (ref 3.87–5.11)
Retic Count, Absolute: 393.4 10*3/uL — ABNORMAL HIGH (ref 19.0–186.0)
Retic Ct Pct: 14.9 % — ABNORMAL HIGH (ref 0.4–3.1)

## 2015-11-04 LAB — POC URINE PREG, ED: Preg Test, Ur: NEGATIVE

## 2015-11-04 LAB — URINE MICROSCOPIC-ADD ON
BACTERIA UA: NONE SEEN
WBC UA: NONE SEEN WBC/hpf (ref 0–5)

## 2015-11-04 MED ORDER — KETOROLAC TROMETHAMINE 30 MG/ML IJ SOLN
30.0000 mg | Freq: Once | INTRAMUSCULAR | Status: AC
  Administered 2015-11-04: 30 mg via INTRAVENOUS
  Filled 2015-11-04: qty 1

## 2015-11-04 MED ORDER — HYDROMORPHONE HCL 1 MG/ML IJ SOLN
1.0000 mg | Freq: Once | INTRAMUSCULAR | Status: AC
  Administered 2015-11-04: 1 mg via INTRAVENOUS
  Filled 2015-11-04: qty 1

## 2015-11-04 MED ORDER — ONDANSETRON HCL 4 MG/2ML IJ SOLN
4.0000 mg | Freq: Once | INTRAMUSCULAR | Status: AC
  Administered 2015-11-04: 4 mg via INTRAVENOUS
  Filled 2015-11-04: qty 2

## 2015-11-04 MED ORDER — HYDROMORPHONE HCL 1 MG/ML IJ SOLN
1.0000 mg | Freq: Once | INTRAMUSCULAR | Status: AC
Start: 2015-11-04 — End: 2015-11-04
  Administered 2015-11-04: 1 mg via INTRAVENOUS
  Filled 2015-11-04: qty 1

## 2015-11-04 MED ORDER — SODIUM CHLORIDE 0.9 % IV BOLUS (SEPSIS)
1000.0000 mL | Freq: Once | INTRAVENOUS | Status: AC
  Administered 2015-11-04: 1000 mL via INTRAVENOUS

## 2015-11-04 MED ORDER — SODIUM CHLORIDE 0.9 % IV SOLN
INTRAVENOUS | Status: DC
  Administered 2015-11-04: 19:00:00 via INTRAVENOUS

## 2015-11-04 NOTE — ED Provider Notes (Signed)
CSN: [id number]     Arrival date & time 11/04/15  1615 History   First MD Initiated Contact with Patient 11/04/15 1833     Chief Complaint  Patient presents with  . Sickle Cell Pain Crisis   HPI  Ms. Mcgann is a 20 year old female presenting with sickle cell pain crisis. She reports onset of symptoms 2 days ago. The pain is located in her anterior thighs and lower abdomen. She reports this is typical of her sickle cell pain. She describes it as severe aching. She has taken tylenol for the pain without relief. She is currently incarcerated and cannot receive narcotics while in custody. Nothing exacerbates the pain. Denies fevers, chills, chest pain, cough, SOB, nausea, vomiting, dysuria, flank pain, vaginal discharge, back pain, upper extremity pain, rash, weakness or syncope. She reports needing to be hospitalized for sickle cell pain earlier this year.   Past Medical History  Diagnosis Date  . Asthma   . Sickle cell disease ([place])   . Amenorrhea   . Hyperbilirubinemia 02/26/2015  . Drug-induced pruritus 02/26/2015  . GERD (gastroesophageal reflux disease)   . Headache   . Anemia     SICKLE CELL   Past Surgical History  Procedure Laterality Date  . 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 Tonsillectomy      Age 47   Family History  Problem Relation Age of Onset  . Hypertension Paternal Grandfather   . Sickle cell trait Father   . Cancer Mother     Died in 2009-02-08   Social History  Substance Use Topics  . Smoking status: Current Some Day Smoker -- 0.50 packs/day    Types: Cigarettes  . Smokeless tobacco: Never Used  . Alcohol Use: No     Comment: Denies 06/12/2014   OB History    No data available     Review of Systems  Constitutional: Negative for fever and chills.  HENT: Negative.   Eyes: Negative for visual disturbance.  Respiratory: Negative for shortness of breath.   Cardiovascular: Negative for chest pain.  Gastrointestinal: Positive for abdominal pain.  Negative for nausea and vomiting.  Genitourinary: Negative for dysuria, hematuria, flank pain and vaginal discharge.  Musculoskeletal: Positive for myalgias. Negative for back pain and neck pain.  Skin: Negative for rash.  Neurological: Negative for dizziness, syncope, weakness and headaches.  All other systems reviewed and are negative.     Allergies  Review of patient's allergies indicates no known allergies.  Home Medications   Prior to Admission medications   Medication Sig Start Date End Date Taking? Authorizing Provider  diphenhydramine-acetaminophen (TYLENOL PM) 25-500 MG TABS tablet Take 2 tablets by mouth at bedtime as needed (pain).   Yes Historical Provider, MD  folic acid (FOLVITE) 1 MG tablet Take 1 tablet (1 mg total) by mouth daily. 03/11/15  Yes Vivi Barrack, MD  ibuprofen (ADVIL,MOTRIN) 200 MG tablet Take 1,000 mg by mouth every 6 (six) hours as needed for headache or moderate pain.   Yes Historical Provider, MD  oxyCODONE (OXY IR/ROXICODONE) 5 MG immediate release tablet Take 1 tablet (5 mg total) by mouth every 4 (four) hours as needed for moderate pain or severe pain. 08/15/15  Yes Alyssa A Haney, MD  OxyCODONE 30 MG T12A Take 30 mg by mouth every 12 (twelve) hours. 08/15/15  Yes Alyssa A Haney, MD  senna-docusate (SENOKOT-S) 8.6-50 MG tablet Take 1 tablet by mouth 2 (two) times daily. 08/15/15  Yes Veatrice Bourbon, MD  hydrocortisone 1 % lotion Apply 1 application topically 2 (two) times daily. Patient not taking: Reported on 02/25/2015 12/01/14   Alvina Chou, PA-C  ondansetron (ZOFRAN ODT) 4 MG disintegrating tablet Take 1 tablet (4 mg total) by mouth every 8 (eight) hours as needed for nausea or vomiting. Patient not taking: Reported on 02/25/2015 12/01/14   Alvina Chou, PA-C   BP 120/73 mmHg  Pulse 71  Temp(Src) 98.5 F (36.9 C) (Oral)  Resp 17  Ht 5\' 4"  (1.626 m)  Wt 61.236 kg  BMI 23.16 kg/m2  SpO2 97%  LMP 11/03/2015 Physical Exam   Constitutional: She appears well-developed and well-nourished. No distress.  HENT:  Head: Normocephalic and atraumatic.  Mouth/Throat: Oropharynx is clear and moist.  Eyes: Conjunctivae and EOM are normal. Pupils are equal, round, and reactive to light. Right eye exhibits no discharge. Left eye exhibits no discharge. No scleral icterus.  Neck: Normal range of motion. Neck supple.  Cardiovascular: Normal rate, regular rhythm and normal heart sounds.   Pulmonary/Chest: Effort normal and breath sounds normal. No respiratory distress. She has no wheezes. She has no rales.  Abdominal: Soft. There is no tenderness. There is no rebound and no guarding.  Musculoskeletal: Normal range of motion.  Full ROM of the lower extremities. Bilateral lower extremities are not TTP but she indicates her pain as the anterior aspect of both thighs. No swelling, erythema or point tenderness over lower extremities. Moves all extremities spontaneously and walks with a steady gait in ED.   Neurological: She is alert. Coordination normal.  5/5 strength of BLE. Sensation to light touch intact.   Skin: Skin is warm and dry. No rash noted. No erythema.  No skin changes over abdomen or lower extremities.  Psychiatric: She has a normal mood and affect. Her behavior is normal.  Nursing note and vitals reviewed.   ED Course  Procedures (including critical care time) Labs Review Labs Reviewed  COMPREHENSIVE METABOLIC PANEL - Abnormal; Notable for the following:    Creatinine, Ser 0.42 (*)    AST 45 (*)    Total Bilirubin 5.5 (*)    All other components within normal limits  CBC WITH DIFFERENTIAL/PLATELET - Abnormal; Notable for the following:    RBC 2.65 (*)    Hemoglobin 9.8 (*)    HCT 27.2 (*)    MCV 102.6 (*)    MCH 37.0 (*)    RDW 24.6 (*)    Platelets 514 (*)    Lymphs Abs 4.4 (*)    Monocytes Absolute 1.1 (*)    All other components within normal limits  RETICULOCYTES - Abnormal; Notable for the  following:    Retic Ct Pct 14.9 (*)    RBC. 2.64 (*)    Retic Count, Manual 393.4 (*)    All other components within normal limits  URINALYSIS, ROUTINE W REFLEX MICROSCOPIC (NOT AT [facility]) - Abnormal; Notable for the following:    Hgb urine dipstick SMALL (*)    All other components within normal limits  URINE MICROSCOPIC-ADD ON - Abnormal; Notable for the following:    Squamous Epithelial / LPF 0-5 (*)    All other components within normal limits  POC URINE PREG, ED    Imaging Review Dg Chest 2 View  11/04/2015  CLINICAL DATA:  Acute onset of shortness of breath. Sickle cell pain crisis. Initial encounter. EXAM: CHEST  2 VIEW COMPARISON:  Chest radiograph performed 08/13/2015 FINDINGS: The lungs are well-aerated and  clear. There is no evidence of focal opacification, pleural effusion or pneumothorax. The heart is borderline enlarged. No acute osseous abnormalities are seen. IMPRESSION: Borderline cardiomegaly.  Lungs remain grossly clear. Electronically Signed   By: Garald Balding M.D.   On: 11/04/2015 23:10   I have personally reviewed and evaluated these images and lab results as part of my medical decision-making.   EKG Interpretation None      MDM   Final diagnoses:  Sickle cell pain crisis ([place])   20 year old female presenting with sickle cell pain x 2 days. Pt is currently incarcerated and has not had access to her typical sickle cell pain medications. Pain in thighs and lower abdomen. No other complaints at this time. Pain uncontrolled in ED after 2 mg dilaudid and 30 mg toradol. VSS. Pt is nontoxic appearing. Benign physical exam. Blood work at baseline. CXR negative. Consulted family practice and pt will be admitted to their service for pain control.     Lahoma Crocker Alfie Rideaux, PA-C 11/04/15 [place], MD 11/10/15 [phone]

## 2015-11-04 NOTE — ED Notes (Signed)
Food given to patient.  

## 2015-11-04 NOTE — ED Notes (Signed)
Pt c/o abdominal pain with lower back pain for 2 days.  Pt has hx of sickle cell.

## 2015-11-04 NOTE — ED Notes (Signed)
Bed: [id number] Expected date:  Expected time:  Means of arrival:  Comments: No bed, no monitor

## 2015-11-04 NOTE — ED Notes (Signed)
PA at bedside Pt alert and oriented x4. Respirations even and unlabored, bilateral symmetrical rise and fall of chest. Skin warm and dry. In no acute distress. Denies needs.   

## 2015-11-04 NOTE — ED Notes (Signed)
Pt c/o abdominal, lower back and bilateral thigh pain for 2 days.

## 2015-11-05 LAB — CBC WITH DIFFERENTIAL/PLATELET
BASOS PCT: 0 %
Basophils Absolute: 0 10*3/uL (ref 0.0–0.1)
Eosinophils Absolute: 0.1 10*3/uL (ref 0.0–0.7)
Eosinophils Relative: 1 %
HEMATOCRIT: 20.6 % — AB (ref 36.0–46.0)
HEMOGLOBIN: 7.4 g/dL — AB (ref 12.0–15.0)
LYMPHS ABS: 5.9 10*3/uL — AB (ref 0.7–4.0)
LYMPHS PCT: 57 %
MCH: 36.3 pg — AB (ref 26.0–34.0)
MCHC: 35.9 g/dL (ref 30.0–36.0)
MCV: 101 fL — AB (ref 78.0–100.0)
MONOS PCT: 10 %
Monocytes Absolute: 1 10*3/uL (ref 0.1–1.0)
NEUTROS ABS: 3.3 10*3/uL (ref 1.7–7.7)
Neutrophils Relative %: 32 %
Platelets: 427 10*3/uL — ABNORMAL HIGH (ref 150–400)
RBC: 2.04 MIL/uL — ABNORMAL LOW (ref 3.87–5.11)
RDW: 24.5 % — ABNORMAL HIGH (ref 11.5–15.5)
WBC: 10.3 10*3/uL (ref 4.0–10.5)

## 2015-11-05 LAB — CREATININE, SERUM
Creatinine, Ser: 0.73 mg/dL (ref 0.44–1.00)
GFR calc Af Amer: >60 mL/min (ref >60)
GFR calc non Af Amer: >60 mL/min (ref >60)

## 2015-11-05 LAB — TYPE AND SCREEN
ABO/RH(D): B POS
ANTIBODY SCREEN: NEGATIVE

## 2015-11-05 LAB — HEMOGLOBIN AND HEMATOCRIT, BLOOD
HCT: 22.7 % — ABNORMAL LOW (ref 36.0–46.0)
Hemoglobin: 8.1 g/dL — ABNORMAL LOW (ref 12.0–15.0)

## 2015-11-05 LAB — RETICULOCYTES
RBC.: 2.04 MIL/uL — AB (ref 3.87–5.11)
RETIC COUNT ABSOLUTE: 295.8 10*3/uL — AB (ref 19.0–186.0)
RETIC CT PCT: 14.5 % — AB (ref 0.4–3.1)

## 2015-11-05 MED ORDER — MORPHINE SULFATE (PF) 4 MG/ML IV SOLN
4.0000 mg | INTRAVENOUS | Status: DC | PRN
  Administered 2015-11-05 – 2015-11-07 (×16): 4 mg via INTRAVENOUS
  Filled 2015-11-05 (×16): qty 1

## 2015-11-05 MED ORDER — MORPHINE SULFATE (PF) 2 MG/ML IV SOLN
2.0000 mg | INTRAVENOUS | Status: DC | PRN
  Administered 2015-11-05 (×3): 2 mg via INTRAVENOUS
  Filled 2015-11-05 (×3): qty 1

## 2015-11-05 MED ORDER — DEXTROSE-NACL 5-0.45 % IV SOLN
INTRAVENOUS | Status: AC
  Administered 2015-11-05: 05:00:00 via INTRAVENOUS

## 2015-11-05 MED ORDER — ALBUTEROL SULFATE (2.5 MG/3ML) 0.083% IN NEBU: 3.0000 mL | INHALATION_SOLUTION | RESPIRATORY_TRACT | Status: DC | PRN

## 2015-11-05 MED ORDER — FOLIC ACID 1 MG PO TABS
1.0000 mg | ORAL_TABLET | Freq: Every day | ORAL | Status: DC
  Administered 2015-11-05 – 2015-11-08 (×4): 1 mg via ORAL
  Filled 2015-11-05 (×4): qty 1

## 2015-11-05 MED ORDER — OXYCODONE HCL 5 MG PO TABS
5.0000 mg | ORAL_TABLET | ORAL | Status: DC | PRN
  Administered 2015-11-05 (×2): 5 mg via ORAL
  Filled 2015-11-05 (×2): qty 1

## 2015-11-05 MED ORDER — KETOROLAC TROMETHAMINE 30 MG/ML IJ SOLN
30.0000 mg | Freq: Four times a day (QID) | INTRAMUSCULAR | Status: DC
  Administered 2015-11-05 – 2015-11-07 (×9): 30 mg via INTRAVENOUS
  Filled 2015-11-05 (×9): qty 1

## 2015-11-05 MED ORDER — HYDROMORPHONE HCL 1 MG/ML IJ SOLN: 1.0000 mg | INTRAMUSCULAR | Status: DC | PRN

## 2015-11-05 MED ORDER — HYDROMORPHONE HCL 1 MG/ML IJ SOLN
1.0000 mg | Freq: Once | INTRAMUSCULAR | Status: AC
  Administered 2015-11-05: 1 mg via INTRAVENOUS
  Filled 2015-11-05: qty 1

## 2015-11-05 MED ORDER — ENOXAPARIN SODIUM 40 MG/0.4ML ~~LOC~~ SOLN
40.0000 mg | SUBCUTANEOUS | Status: DC
  Administered 2015-11-05 – 2015-11-08 (×4): 40 mg via SUBCUTANEOUS
  Filled 2015-11-05 (×4): qty 0.4

## 2015-11-05 NOTE — ED Notes (Signed)
Call placed to [facility]

## 2015-11-05 NOTE — Progress Notes (Signed)
Utilization review completed.  

## 2015-11-05 NOTE — Progress Notes (Signed)
Interim Progress Note: S: Pt states she is still having pain in her thighs and lower back. She says her pain is the same as when she comes in. The pain medications aren't helping at all. She states she normally requires a higher dose of morphine to control her pain. She denies any shortness of breath, chest pain, fevers, chills, or cough. She has no other concerns this morning.  O: General: Tired-appearing, but non-toxic Resp: Initially heard crackles in the right lung base, but the crackles cleared after the first breath; lungs otherwise clear; normal work of breathing Cardio: RRR, no murmurs, 2+ DP pulses bilaterally Extremities: Anterior thighs are tender to palpation bilaterally, no tenderness of the calves, no lower extremity edema  Labs: Hgb 9.8 > 7.4 Retic 14.9 > 14.5 Cr: 0.42 > 0.73  A/P: - Will keep pain medications as they are for now and monitor her pain closely throughout the day today. - Can consider increasing morphine to 4mg  if she is having persistent pain - Will watch temperatures and O2 requirement closely. Will repeat CXR if she is showing any signs of acute chest. - Recheck H/H this afternoon. Daily CBC/retic if Hgb is stable.  - Transfusion threshold is 7. She has been typed and screened. - Cr looks like it has bumped, but her baseline Cr is 0.6-0.8. Will check repeat BMP tomorrow morning. - Decreased fluids from MIVFs to 3/4MIVFs because we don't want to fluid overload her.

## 2015-11-05 NOTE — ED Notes (Signed)
Called to give report to Antonique, RN on 5N.  She said she will call me back

## 2015-11-05 NOTE — H&P (Signed)
[facility] Admission History and Physical Service Pager: [phone]  Patient name: [name] Medical record number: [id number] Date of birth: [birth year] Age: 20 y.o. Gender: female  Primary Care Provider: Dimas Chyle, MD Consultants: None Code Status: Full   Chief Complaint: sickle cell pain crisis  Assessment and Plan: [name] is a 20 y.o. female presenting with Sickle Cell Pain Crisis. PMH is significant for Sickle Cell Disease SS, tobacco abuse (in remission), mild intermittent asthma.   Sickle Cell Pain Crisis -  Hemoglobin 9.8 and normally 8-10, Retic 14.9% which is near her baseline of Mid- teens. WBC 9.8. CXR negative for infiltrate. Afebrile. Pain is mostly her anterior thighs which is her typical sickle cell pain crisis. Exam remarkable for bilateral anterior thigh pain upon palpation and dorsiflexion as well as paraspinal tenderness upon palpation. Looking through EMR, at home, patient intermittently took MS contin in the past for pain but she is not allowed to take any controlled pain medication in jail, only Tylenol, and she has not followed up with a provider in several months. - Admit to family medicine teaching service, attending Dr. Andria Frames - following vitals - supplemental O2 to keep O4 >94% - Trending Hgb and Retics - Transfusion threshold 7 - type / screen - PO Oxycodone 5 mg q 4 PRN pain and Toradol 30 mg q 6hr (5 day course) for pain - PRN Morphine for break through pain   Sickle Cell Anemia - No hematologist. Poor follow up. S/p splenectomy at 20 years of age. Platelets 514, total Bilirubin 5.5. Echo on 08/13/15 LV EF: 55% - 60% - Pt. With poor follow up.  - She needs outpatient follow up with hematology when she is able to be released from jail. Has had hydroxyurea in the past but it not currently taking it  Asthma - mild persistent per records.  Well controlled. Has not been using albuterol. - Albuterol prn    FEN/GI: Regular diet / D5 1/2 NS @ 125 MIVF for at least 12 hours.  Prophylaxis: Lovenox.   Disposition: Admit to inpatient FMTS, attending Dr. Andria Frames  History of Present Illness:  [name] is a 20 y.o. female presenting as a transfer from [facility] ED with pain in her upper thighs consistent with her sickle cell pain crises.   Patient normally takes MS Contin and percocet for pain at home but has not been taking it while she has been in jail (no narcotics provided while in jail and she has not been seen by a provider since her last admission 10/16) . Her pain started on January 1st. It began in her upper thighs and has gotten progressively worse, no pain in calves, or upper extremities. She also notes new onset of pain in the lumbar region bilaterally without radiation.  No swollen or painful joints but has had swollen fingers and toes in the past. No fever, SOB, cough, or chest pain. Has had acute chest syndrome "a long time ago" and has received blood transfusions in tehe.  No recent illnesses's but is exposed to many people in jail. She did not try heating pads to the area. Only tried Tylenol which was not effective.   Triggers for pain crisis: cold air and when she gets sick. Admits to dehydration because she doesn't like drinking the water they give her in her room.   Denies: hematemesis, hematochezia, rashes, dysuria, abdominal pain  Per her police escort, they do not provide narcotics to the inmates,  but notes occasionally they may be provided if recently prescribed (but not guaranteed). He provides the phone number of the head nurse, [phone]) and notes she would probably like to know what happened overnight.   LMP: 11/03/14  ED course at [facility] Pain uncontrolled in ED after 2 mg dilaudid and 30 mg toradol. VSS with benign physical exam. CXR negative. BMP WNL except for elevated bilirubin. CBC at baseline. UA WNL. Retic elevated to 14.9% (appears to be around patient's  baseline)  Review Of Systems: Per HPI with the following additions: none Otherwise the remainder of the systems were negative.  Patient Active Problem List   Diagnosis Date Noted  . Bibasilar crackles   . 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 Acute sickle cell crisis ([place]) 08/11/2015  . 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 Suicide ideation 03/11/2015  . Psychomotor agitation 03/11/2015  . Sickle cell anemia with pain ([place]) 03/10/2015  . Vasoocclusive sickle cell crisis ([place])   . Hyperbilirubinemia 02/26/2015  . Drug-induced pruritus 02/26/2015  . Sickle cell anemia with crisis ([place]) 02/25/2015  . Tobacco abuse   . Sickle cell crisis ([place])   . Leukocytosis   . Hematochezia 02/27/2014  . UTI (lower urinary tract infection) 02/26/2014  . Vaginal discharge 02/24/2014  . Sickle cell pain crisis ([place]) 11/23/2013  . Facial rash 03/25/2013  . Birth control counseling 08/14/2012  . Asthma, mild intermittent 12/14/2010  . Hb-SS disease with crisis ([place]) 10/12/2009    Past Medical History: Past Medical History  Diagnosis Date  . Asthma   . Sickle cell disease ([place])   . Amenorrhea   . Hyperbilirubinemia 02/26/2015  . Drug-induced pruritus 02/26/2015  . GERD (gastroesophageal reflux disease)   . Headache   . Anemia     SICKLE CELL    Past Surgical History: Past Surgical History  Procedure Laterality Date  . Splenectomy      Age 44 for sequestration  . Tonsillectomy      Age 70    Social History: Social History  Substance Use Topics  . Smoking status: Current Some Day Smoker -- 0.50 packs/day    Types: Cigarettes  . Smokeless tobacco: Never Used  . Alcohol Use: No     Comment: Denies 06/12/2014   Additional social history: Currently incarcerated denies alcohol, drug, or tobacco use recently Please also refer to relevant sections of EMR.  Family History: Family History  Problem Relation Age of Onset  . Hypertension Paternal Grandfather    . Sickle cell trait Father   . Cancer Mother     Died in 02-09-09    Allergies and Medications: No Known Allergies No current facility-administered medications on file prior to encounter.   Current Outpatient Prescriptions on File Prior to Encounter  Medication Sig Dispense Refill  . diphenhydramine-acetaminophen (TYLENOL PM) 25-500 MG TABS tablet Take 2 tablets by mouth at bedtime as needed (pain).    . folic acid (FOLVITE) 1 MG tablet Take 1 tablet (1 mg total) by mouth daily. 30 tablet 0  . ibuprofen (ADVIL,MOTRIN) 200 MG tablet Take 1,000 mg by mouth every 6 (six) hours as needed for headache or moderate pain.    Marland Kitchen oxyCODONE (OXY IR/ROXICODONE) 5 MG immediate release tablet Take 1 tablet (5 mg total) by mouth every 4 (four) hours as needed for moderate pain or severe pain. 30 tablet 0  . OxyCODONE 30 MG T12A Take 30 mg by mouth every 12 (twelve) hours. 11 tablet 0  .  senna-docusate (SENOKOT-S) 8.6-50 MG tablet Take 1 tablet by mouth 2 (two) times daily. 30 tablet 3  . hydrocortisone 1 % lotion Apply 1 application topically 2 (two) times daily. (Patient not taking: Reported on 02/25/2015) 118 mL 0  . ondansetron (ZOFRAN ODT) 4 MG disintegrating tablet Take 1 tablet (4 mg total) by mouth every 8 (eight) hours as needed for nausea or vomiting. (Patient not taking: Reported on 02/25/2015) 10 tablet 0    Objective: BP 107/50 mmHg  Pulse 68  Temp(Src) 98.4 F (36.9 C) (Oral)  Resp 16  Ht 5\' 4"  (1.626 m)  Wt 145 lb 8.1 oz (66 kg)  BMI 24.96 kg/m2  SpO2 97%  LMP 11/03/2015 Exam: General: In NAD, alert and oriented, does not appear to be in pain, pleasant. Chains/cuffs on LEs bilaterally.   Eyes: icteric sclera bilaterally ENTM: MMM, good dentition Neck: supple, No LAD.  Cardiovascular: RRR, normal s1 and s2, no rubs, gallops, soft systolic murmur Respiratory: clear to auscultation bilaterally, no increased work of breathing no wheezing, crackles, rhonchi Abdomen: soft, normal bowel  sounds, non distended, non tender, cannot palpate the spleen or liver. MSK: Paraspinal muscle tenderness to palpation in the lumbar region. Pain endorsed when anterior thighs palpated and with plantar flexion. No joint swelling, erythema, or warmth. No tenderness anywhere else on exam.  Skin: No rashes or lesions Neuro: Alert and oriented. no focal deficits. 5/5 strength in bilateral upper and lower extremities  Psych: Normal mood and affect  Labs and Imaging: CBC BMET   Recent Labs Lab 11/04/15 1657  WBC 9.8  HGB 9.8*  HCT 27.2*  PLT 514*    Recent Labs Lab 11/04/15 1657  NA 139  K 4.4  CL 107  CO2 23  BUN 7  CREATININE 0.42*  GLUCOSE 98  CALCIUM 9.5     Dg Chest 2 View  11/04/2015  CLINICAL DATA:  Acute onset of shortness of breath. Sickle cell pain crisis. Initial encounter. EXAM: CHEST  2 VIEW COMPARISON:  Chest radiograph performed 08/13/2015 FINDINGS: The lungs are well-aerated and clear. There is no evidence of focal opacification, pleural effusion or pneumothorax. The heart is borderline enlarged. No acute osseous abnormalities are seen. IMPRESSION: Borderline cardiomegaly.  Lungs remain grossly clear. Electronically Signed   By: Garald Balding M.D.   On: 11/04/2015 23:10     Carlyle Dolly, MD 11/05/2015, 4:00 AM PGY-1, [place] Intern pager: [phone], text pages welcome  Upper Level Addendum:  I have seen and evaluated this patient along with Dr. Juanito Doom and reviewed the above note, making necessary revisions in purple.   Archie Patten, MD [place] Resident, PGY-2

## 2015-11-05 NOTE — Progress Notes (Signed)
Update: I spoke with the nurse at the prison regarding [name]. I updated her on how the patient did overnight. She states that the Patient would be allowed to have narcotic pain medications at the prison for a few days after discharge. They only carry Tylenol 3, Tramadol, and Vicodin at the prison. On discharge, we do not need to send written prescriptions for the medications. We just need to fax the discharge summary with our medication recommendations. Their fax number is [phone].  Hyman Bible, MD PGY-1

## 2015-11-06 LAB — BASIC METABOLIC PANEL
ANION GAP: 6 (ref 5–15)
BUN: 6 mg/dL (ref 6–20)
CO2: 25 mmol/L (ref 22–32)
Calcium: 8.7 mg/dL — ABNORMAL LOW (ref 8.9–10.3)
Chloride: 109 mmol/L (ref 101–111)
Creatinine, Ser: 0.59 mg/dL (ref 0.44–1.00)
GFR calc Af Amer: >60 mL/min (ref >60)
GLUCOSE: 91 mg/dL (ref 65–99)
POTASSIUM: 4.4 mmol/L (ref 3.5–5.1)
Sodium: 140 mmol/L (ref 135–145)

## 2015-11-06 LAB — RETICULOCYTES
RBC.: 2.03 MIL/uL — ABNORMAL LOW (ref 3.87–5.11)
RETIC COUNT ABSOLUTE: 330.9 10*3/uL — AB (ref 19.0–186.0)
Retic Ct Pct: 16.3 % — ABNORMAL HIGH (ref 0.4–3.1)

## 2015-11-06 LAB — CBC
HEMATOCRIT: 20.3 % — AB (ref 36.0–46.0)
HEMOGLOBIN: 7.2 g/dL — AB (ref 12.0–15.0)
MCH: 35.5 pg — ABNORMAL HIGH (ref 26.0–34.0)
MCHC: 35.5 g/dL (ref 30.0–36.0)
MCV: 100 fL (ref 78.0–100.0)
Platelets: 389 10*3/uL (ref 150–400)
RBC: 2.03 MIL/uL — AB (ref 3.87–5.11)
RDW: 24.7 % — ABNORMAL HIGH (ref 11.5–15.5)
WBC: 10.8 10*3/uL — ABNORMAL HIGH (ref 4.0–10.5)

## 2015-11-06 MED ORDER — ONDANSETRON HCL 4 MG PO TABS
4.0000 mg | ORAL_TABLET | Freq: Four times a day (QID) | ORAL | Status: DC | PRN
  Administered 2015-11-06: 4 mg via ORAL
  Filled 2015-11-06: qty 1

## 2015-11-06 MED ORDER — ACETAMINOPHEN 325 MG PO TABS
650.0000 mg | ORAL_TABLET | Freq: Four times a day (QID) | ORAL | Status: DC
  Administered 2015-11-06 – 2015-11-08 (×9): 650 mg via ORAL
  Filled 2015-11-06 (×9): qty 2

## 2015-11-06 MED ORDER — OXYCODONE HCL ER 15 MG PO T12A
30.0000 mg | EXTENDED_RELEASE_TABLET | Freq: Two times a day (BID) | ORAL | Status: DC
  Administered 2015-11-06 – 2015-11-08 (×5): 30 mg via ORAL
  Filled 2015-11-06 (×5): qty 2

## 2015-11-06 MED ORDER — SENNA 8.6 MG PO TABS
1.0000 | ORAL_TABLET | Freq: Every day | ORAL | Status: DC | PRN
  Administered 2015-11-06: 8.6 mg via ORAL
  Filled 2015-11-06: qty 1

## 2015-11-06 MED ORDER — POLYETHYLENE GLYCOL 3350 17 G PO PACK
17.0000 g | PACK | Freq: Every day | ORAL | Status: DC
  Administered 2015-11-06 – 2015-11-08 (×3): 17 g via ORAL
  Filled 2015-11-06 (×3): qty 1

## 2015-11-06 MED ORDER — HYDROMORPHONE HCL 1 MG/ML IJ SOLN
1.0000 mg | Freq: Once | INTRAMUSCULAR | Status: AC
  Administered 2015-11-06: 1 mg via INTRAVENOUS
  Filled 2015-11-06: qty 1

## 2015-11-06 MED ORDER — HYDROMORPHONE HCL 1 MG/ML IJ SOLN
1.0000 mg | Freq: Once | INTRAMUSCULAR | Status: AC
Start: 2015-11-06 — End: 2015-11-06
  Administered 2015-11-06: 1 mg via INTRAVENOUS
  Filled 2015-11-06: qty 1

## 2015-11-06 NOTE — Discharge Summary (Signed)
[facility] Discharge Summary  Patient name: [name] Medical record number: [id number] Date of birth: 1996/03/29 Age: 20 y.o. Gender: female Date of Admission: 11/04/2015  Date of Discharge: 11/07/14 Admitting Physician: Zenia Resides, MD  Primary Care Provider: Dimas Chyle, MD Consultants: None  Indication for Hospitalization: Acute sickle cell pain crisis  Discharge Diagnoses/Problem List:  Hb-SS disease with crisis Asthma, mild intermittent  Disposition: Back to prison  Discharge Condition: Stable, improved  Discharge Exam:  General: in NAD, alert and oriented HEENT: [place]/AT, EOMI, MMM. Cardiovascular: RRR, S1S2,  Respiratory: CTAB, normal effort  Abdomen: soft, NTND, +BS  Extremities: moves all freely, mild tenderness in anterior thighs bilaterally  Brief Hospital Course:  [name] is a 20 year old female who was transferred from [facility] ED with bilateral thigh and lower back pain. She has a history of acute chest, but did not have any fever, SOB, cough, or chest pain on admission. CXR was negative for acute abnormalities. Hgb was 9.8 and reticulocyte count was 14%. She was treated with PO Oxycodone 5mg  q4hrs prn, Toradol 30mg  q6hs, and Morphine 4mg  q2hrs prn. Her hemoglobin dropped to 7.2 and then stabilized. She did not require any blood transfusions. She did not show any signs of acute chest while she was here. Her pain improved and she was weaned off Morphine. Because the prison only has certain narcotics available, she was discharged home on Vicodin 10-300mg  q4hrs scheduled for the first 2 days after discharge, then q4hrs prn for the next two days. She can then take Tylenol for pain as needed.  Issues for Follow Up:  1. None  Significant Procedures: None  Significant Labs and Imaging:   Recent Labs Lab 11/05/15 0555 11/05/15 1600 11/06/15 0423 11/08/15 0623  WBC 10.3  --  10.8* 10.4  HGB 7.4* 8.1* 7.2* 7.0*   HCT 20.6* 22.7* 20.3* 19.4*  PLT 427*  --  389 359    Recent Labs Lab 11/04/15 1657 11/05/15 0555 11/06/15 0423  NA 139  --  140  K 4.4  --  4.4  CL 107  --  109  CO2 23  --  25  GLUCOSE 98  --  91  BUN 7  --  6  CREATININE 0.42* 0.73 0.59  CALCIUM 9.5  --  8.7*  ALKPHOS 77  --   --   AST 45*  --   --   ALT 34  --   --   ALBUMIN 4.7  --   --    CXR: borderline cardiomegaly, no acute abnormalities  Results/Tests Pending at Time of Discharge: None  Discharge Medications:    Medication List    STOP taking these medications        diphenhydramine-acetaminophen 25-500 MG Tabs tablet  Commonly known as:  TYLENOL PM     oxyCODONE 30 MG 12 hr tablet     oxyCODONE 5 MG immediate release tablet  Commonly known as:  Oxy IR/ROXICODONE      TAKE these medications        folic acid 1 MG tablet  Commonly known as:  FOLVITE  Take 1 tablet (1 mg total) by mouth daily.     Hydrocodone-Acetaminophen 10-300 MG Tabs  Commonly known as:  VICODIN HP  Take 1 tablet by mouth every 4 hours for 2 days. Then take every 4 hours PRN for 2 days.     hydrocortisone 1 % lotion  Apply 1 application topically 2 (two) times  daily.     ibuprofen 200 MG tablet  Commonly known as:  ADVIL,MOTRIN  Take 1,000 mg by mouth every 6 (six) hours as needed for headache or moderate pain.     ondansetron 4 MG disintegrating tablet  Commonly known as:  ZOFRAN ODT  Take 1 tablet (4 mg total) by mouth every 8 (eight) hours as needed for nausea or vomiting.     senna-docusate 8.6-50 MG tablet  Commonly known as:  Senokot-S  Take 1 tablet by mouth 2 (two) times daily.        Discharge Instructions: Please refer to Patient Instructions section of EMR for full details.  Patient was counseled important signs and symptoms that should prompt return to medical care, changes in medications, dietary instructions, activity restrictions, and follow up appointments.   Follow-Up Appointments: No follow-up  appointment scheduled, as Pt is currently in prison. The prison nurse will resume care.  Sela Hua, MD 11/08/2015, 12:51 PM PGY-1, [place]

## 2015-11-06 NOTE — Progress Notes (Signed)
Family Medicine Teaching Service Daily Progress Note Intern Pager: [phone]  Patient name: NIKKOLE GILSTER Medical record number: [id number] Date of birth: 1996-02-27 Age: 20 y.o. Gender: female  Primary Care Provider: Dimas Chyle, MD Consultants: none Code Status: full   Pt Overview and Major Events to Date:  1/6: admitted for pain crisis  Assessment and Plan: WALLIS MINARDI is a 20 y.o. female presenting with Sickle Cell Pain Crisis. PMH is significant for Sickle Cell Disease SS, tobacco abuse, mild intermittent asthma.   Sickle Cell Pain Crisis - reports that her pain is still 6-7/10 and remains unchanged since admission.  - give Dilaudid 1 mg now and again in one hour  - Schedule oxycontin 30 mg BID  - discontinue Oxy IR  - continue morphine 4 mg Q2 PRN, will start to wean as tolerated.  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 Poor follow up. S/p splenectomy at 20 years of age. Platelets 514, total Bilirubin 5.5. Echo on 08/13/15 LV EF: 55% - 60% - Pt. With poor follow up.  - She needs outpatient follow up with hematology when she is able to be released from jail. Has had hydroxyurea in the past but it not currently taking it  Asthma - mild persistent.Well controlled.  - Albuterol prn   FEN/GI: Regular diet / D5 1/2 NS @ 125 MIVF for at least 12 hours.  Prophylaxis: Lovenox.   Disposition: pending improvement   Subjective:  Reports no improvement in her pain.   Objective: Temp:  [98.4 F (36.9 C)-98.6 F (37 C)] 98.6 F (37 C) (01/07 0600) Pulse Rate:  [65-75] 65 (01/07 0600) Resp:  [16-18] 18 (01/07 0600) BP: (113-125)/(48-57) 125/48 mmHg (01/07 0600) SpO2:  [95 %-97 %] 95 % (01/07 0600) Physical Exam: General: in NAD, alert and oriented  Cardiovascular: RRR, S1S2,  Respiratory: CTAB, normal effort   Abdomen: soft, NTND, +BS  Extremities: moves all freely   Laboratory:  Recent Labs Lab 11/04/15 1657 11/05/15 0555 11/05/15 1600 11/06/15 0423  WBC 9.8 10.3  --  10.8*  HGB 9.8* 7.4* 8.1* 7.2*  HCT 27.2* 20.6* 22.7* 20.3*  PLT 514* 427*  --  389    Recent Labs Lab 11/04/15 1657 11/05/15 0555 11/06/15 0423  NA 139  --  140  K 4.4  --  4.4  CL 107  --  109  CO2 23  --  25  BUN 7  --  6  CREATININE 0.42* 0.73 0.59  CALCIUM 9.5  --  8.7*  PROT 7.8  --   --   BILITOT 5.5*  --   --   ALKPHOS 77  --   --   ALT 34  --   --   AST 45*  --   --   GLUCOSE 98  --  91    Imaging/Diagnostic Tests: CXR: borderline cardiomegaly   Rosemarie Ax, MD 11/06/2015, 10:11 AM PGY-3, [place] Intern pager: [phone], text pages welcome

## 2015-11-07 MED ORDER — MORPHINE SULFATE (PF) 4 MG/ML IV SOLN: 4.0000 mg | Freq: Four times a day (QID) | INTRAVENOUS | Status: DC | PRN

## 2015-11-07 MED ORDER — IBUPROFEN 200 MG PO TABS
600.0000 mg | ORAL_TABLET | Freq: Four times a day (QID) | ORAL | Status: DC
  Administered 2015-11-07 – 2015-11-08 (×4): 600 mg via ORAL
  Filled 2015-11-07 (×4): qty 3

## 2015-11-07 MED ORDER — OXYCODONE HCL 5 MG PO TABS
5.0000 mg | ORAL_TABLET | ORAL | Status: DC | PRN
  Administered 2015-11-07 – 2015-11-08 (×3): 5 mg via ORAL
  Filled 2015-11-07 (×3): qty 1

## 2015-11-07 NOTE — Progress Notes (Signed)
Family Medicine Teaching Service Daily Progress Note Intern Pager: [phone]  Patient name: [name] Medical record number: [id number] Date of birth: [birth year] Age: 20 y.o. Gender: female  Primary Care Provider: Dimas Chyle, MD Consultants: none Code Status: full   Pt Overview and Major Events to Date:  1/6: admitted for pain crisis  Assessment and Plan: [name] is a 20 y.o. female presenting with Sickle Cell Pain Crisis. PMH is significant for Sickle Cell Disease SS, tobacco abuse, mild intermittent asthma.   Sickle Cell Pain Crisis - Pain improved with current regimen. No chest pain. 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 Poor follow up. S/p splenectomy at 20 years of age. Platelets 514, total Bilirubin 5.5. Echo on 08/13/15 LV EF: 55% - 60% - Pt. With poor follow up.  - She needs outpatient follow up with hematology. Has had hydroxyurea in the past but it not currently taking it  Asthma - mild persistent.Well controlled.  - Albuterol prn   FEN/GI: Regular diet / KVO  Prophylaxis: Lovenox.   Disposition: pending improvement   Subjective:  Patient reports some frontal headache overnight with some. Received scheduled Toradol and oxycodone 30mg  over the last 24 hours. Has been using PRN morphine about q3 hours in the last 24 hours.   Objective: Temp:  [98.2 F (36.8 C)-98.7 F (37.1 C)] 98.2 F (36.8 C) (01/08 0436) Pulse Rate:  [72-87] 80 (01/08 0436) Resp:  [18] 18 (01/08 0436) BP: (112-121)/(54-67) 112/54 mmHg (01/08 0436) SpO2:  [92 %-100 %] 92 % (01/08 0436) Physical Exam: General: in NAD, alert and oriented Nose: Dried blood in left nare. No exposed vessels Septum intact. Cardiovascular: RRR, S1S2,   Respiratory: CTAB, normal effort  Abdomen: soft, NTND, +BS  Extremities: moves all freely, mild tenderness in left thigh  Laboratory:  Recent Labs Lab 11/04/15 1657 11/05/15 0555 11/05/15 1600 11/06/15 0423  WBC 9.8 10.3  --  10.8*  HGB 9.8* 7.4* 8.1* 7.2*  HCT 27.2* 20.6* 22.7* 20.3*  PLT 514* 427*  --  389    Recent Labs Lab 11/04/15 1657 11/05/15 0555 11/06/15 0423  NA 139  --  140  K 4.4  --  4.4  CL 107  --  109  CO2 23  --  25  BUN 7  --  6  CREATININE 0.42* 0.73 0.59  CALCIUM 9.5  --  8.7*  PROT 7.8  --   --   BILITOT 5.5*  --   --   ALKPHOS 77  --   --   ALT 34  --   --   AST 45*  --   --   GLUCOSE 98  --  91    Imaging/Diagnostic Tests: CXR: borderline cardiomegaly   Mariel Aloe, MD 11/07/2015, 9:03 AM PGY-3, [place] Intern pager: [phone], text pages welcome

## 2015-11-08 LAB — CBC
HCT: 19.4 % — ABNORMAL LOW (ref 36.0–46.0)
HEMOGLOBIN: 7 g/dL — AB (ref 12.0–15.0)
MCH: 36.1 pg — ABNORMAL HIGH (ref 26.0–34.0)
MCHC: 36.1 g/dL — AB (ref 30.0–36.0)
MCV: 100 fL (ref 78.0–100.0)
PLATELETS: 359 10*3/uL (ref 150–400)
RBC: 1.94 MIL/uL — ABNORMAL LOW (ref 3.87–5.11)
RDW: 26.8 % — ABNORMAL HIGH (ref 11.5–15.5)
WBC: 10.4 10*3/uL (ref 4.0–10.5)

## 2015-11-08 MED ORDER — HYDROCODONE-ACETAMINOPHEN 10-300 MG PO TABS: ORAL_TABLET | ORAL | Status: DC

## 2015-11-08 NOTE — Progress Notes (Signed)
[name] back to prior facility per MD order. Discharge instructions reviewed and discussed with patient. All questions and concerns answered. Copy of instructions given to patient. IV removed.  Patient escorted by officers. No distress noted upon discharge.   [name] [place] 11/08/2015 3:00 PM

## 2015-11-08 NOTE — Discharge Instructions (Signed)
You were hospitalized for a sickle cell pain crisis. We monitored your hemoglobin closely and treated your pain with medications. Your pain improved and you were stable for discharge.  If you start having severe pain not controlled with medications, shortness of breath, or persistent fever, please come back to the ED.

## 2015-11-08 NOTE — Progress Notes (Signed)
Family Medicine Teaching Service Daily Progress Note Intern Pager: [phone]  Patient name: [name] Medical record number: [id number] Date of birth: 18-Jul-1996 Age: 20 y.o. Gender: female  Primary Care Provider: Dimas Chyle, MD Consultants: none Code Status: full   Pt Overview and Major Events to Date:  1/6: admitted for pain crisis  Assessment and Plan: [name] is a 20 y.o. female presenting with Sickle Cell Pain Crisis. PMH is significant for Sickle Cell Disease SS, tobacco abuse, mild intermittent asthma.   Sickle Cell Pain Crisis - Pain much improved this morning. Hgb stable at 7.0 today from 7.2 yesterday. - Current pain regimen: Oxycontin 30mg  q12hrs, morphine 4mg  6hrs prn, OxyIR 5mg  q4hrs prn, Ibuprofen 600mg  qid. Will d/c morphine today. - Daily CBC/retic - Transfusion threshold 7 - Will need outpatient follow-up with Hem/onc  Asthma, mild persistent, controlled: - Albuterol prn   FEN/GI: Regular diet / KVO  Prophylaxis: Lovenox.   Disposition: [facility]  Subjective:  Patient states her pain is much better this morning. She is only having mild anterior thigh pain. She is ready to be discharged today.  Objective: Temp:  [98 F (36.7 C)-98.5 F (36.9 C)] 98.5 F (36.9 C) (01/09 0514) Pulse Rate:  [64-76] 64 (01/09 0514) Resp:  [17-18] 18 (01/09 0514) BP: (115-126)/(58-61) 118/61 mmHg (01/09 0514) SpO2:  [94 %] 94 % (01/09 0514) Physical Exam: General: in NAD, alert and oriented HEENT: [place]/AT, EOMI, MMM. Cardiovascular: RRR, S1S2,  Respiratory: CTAB, normal effort  Abdomen: soft, NTND, +BS  Extremities: moves all freely, mild tenderness in anterior thighs bilaterally  Laboratory:  Recent Labs Lab 11/05/15 0555 11/05/15 1600 11/06/15 0423 11/08/15 0623  WBC 10.3  --  10.8* 10.4  HGB 7.4* 8.1* 7.2* 7.0*  HCT 20.6* 22.7* 20.3* 19.4*  PLT 427*  --  389 359    Recent Labs Lab 11/04/15 1657  11/05/15 0555 11/06/15 0423  NA 139  --  140  K 4.4  --  4.4  CL 107  --  109  CO2 23  --  25  BUN 7  --  6  CREATININE 0.42* 0.73 0.59  CALCIUM 9.5  --  8.7*  PROT 7.8  --   --   BILITOT 5.5*  --   --   ALKPHOS 77  --   --   ALT 34  --   --   AST 45*  --   --   GLUCOSE 98  --  91    Imaging/Diagnostic Tests: CXR: borderline cardiomegaly   Sela Hua, MD 11/08/2015, 8:09 AM PGY-1, [place] Intern pager: [phone], text pages welcome

## 2016-05-14 ENCOUNTER — Emergency Department (HOSPITAL_COMMUNITY): Payer: Medicare Other

## 2016-05-14 ENCOUNTER — Inpatient Hospital Stay (HOSPITAL_COMMUNITY)
Admission: EM | Admit: 2016-05-14 | Discharge: 2016-05-18 | DRG: 872 | Disposition: A | Discharge status: Home / Self Care | Payer: Medicare Other | Attending: Family Medicine | Admitting: Family Medicine

## 2016-05-14 ENCOUNTER — Encounter (HOSPITAL_COMMUNITY): Payer: Self-pay

## 2016-05-14 DIAGNOSIS — R634 Abnormal weight loss: Secondary | ICD-10-CM | POA: Diagnosis not present

## 2016-05-14 DIAGNOSIS — D5700 Hb-SS disease with crisis, unspecified: Secondary | ICD-10-CM

## 2016-05-14 DIAGNOSIS — D72829 Elevated white blood cell count, unspecified: Secondary | ICD-10-CM

## 2016-05-14 DIAGNOSIS — A4151 Sepsis due to Escherichia coli [E. coli]: Principal | ICD-10-CM | POA: Diagnosis present

## 2016-05-14 DIAGNOSIS — N10 Acute tubulo-interstitial nephritis: Secondary | ICD-10-CM | POA: Diagnosis present

## 2016-05-14 DIAGNOSIS — I9589 Other hypotension: Secondary | ICD-10-CM | POA: Diagnosis not present

## 2016-05-14 DIAGNOSIS — A4150 Gram-negative sepsis, unspecified: Secondary | ICD-10-CM | POA: Diagnosis not present

## 2016-05-14 DIAGNOSIS — R109 Unspecified abdominal pain: Secondary | ICD-10-CM | POA: Diagnosis present

## 2016-05-14 DIAGNOSIS — Z23 Encounter for immunization: Secondary | ICD-10-CM | POA: Diagnosis not present

## 2016-05-14 DIAGNOSIS — E860 Dehydration: Secondary | ICD-10-CM | POA: Diagnosis not present

## 2016-05-14 DIAGNOSIS — J4520 Mild intermittent asthma, uncomplicated: Secondary | ICD-10-CM | POA: Diagnosis not present

## 2016-05-14 DIAGNOSIS — Z79899 Other long term (current) drug therapy: Secondary | ICD-10-CM

## 2016-05-14 DIAGNOSIS — D6489 Other specified anemias: Secondary | ICD-10-CM | POA: Diagnosis not present

## 2016-05-14 DIAGNOSIS — F17210 Nicotine dependence, cigarettes, uncomplicated: Secondary | ICD-10-CM | POA: Diagnosis present

## 2016-05-14 DIAGNOSIS — D571 Sickle-cell disease without crisis: Secondary | ICD-10-CM | POA: Diagnosis present

## 2016-05-14 DIAGNOSIS — A419 Sepsis, unspecified organism: Secondary | ICD-10-CM

## 2016-05-14 DIAGNOSIS — K219 Gastro-esophageal reflux disease without esophagitis: Secondary | ICD-10-CM | POA: Diagnosis present

## 2016-05-14 DIAGNOSIS — R0989 Other specified symptoms and signs involving the circulatory and respiratory systems: Secondary | ICD-10-CM

## 2016-05-14 DIAGNOSIS — R6520 Severe sepsis without septic shock: Secondary | ICD-10-CM | POA: Diagnosis present

## 2016-05-14 DIAGNOSIS — N179 Acute kidney failure, unspecified: Secondary | ICD-10-CM | POA: Diagnosis present

## 2016-05-14 DIAGNOSIS — F329 Major depressive disorder, single episode, unspecified: Secondary | ICD-10-CM | POA: Diagnosis not present

## 2016-05-14 DIAGNOSIS — Z681 Body mass index (BMI) 19 or less, adult: Secondary | ICD-10-CM | POA: Diagnosis not present

## 2016-05-14 DIAGNOSIS — D638 Anemia in other chronic diseases classified elsewhere: Secondary | ICD-10-CM | POA: Insufficient documentation

## 2016-05-14 HISTORY — DX: Acute kidney failure, unspecified: N17.9

## 2016-05-14 LAB — URINALYSIS, ROUTINE W REFLEX MICROSCOPIC
Glucose, UA: NEGATIVE mg/dL
KETONES UR: NEGATIVE mg/dL
NITRITE: NEGATIVE
PH: 5.5 (ref 5.0–8.0)
Protein, ur: 100 mg/dL — AB
SPECIFIC GRAVITY, URINE: 1.015 (ref 1.005–1.030)

## 2016-05-14 LAB — COMPREHENSIVE METABOLIC PANEL
ALK PHOS: 77 U/L (ref 38–126)
ALT: 48 U/L (ref 14–54)
ANION GAP: 8 (ref 5–15)
AST: 35 U/L (ref 15–41)
Albumin: 3.7 g/dL (ref 3.5–5.0)
BILIRUBIN TOTAL: 13.5 mg/dL — AB (ref 0.3–1.2)
BUN: 21 mg/dL — ABNORMAL HIGH (ref 6–20)
CALCIUM: 9 mg/dL (ref 8.9–10.3)
CO2: 21 mmol/L — ABNORMAL LOW (ref 22–32)
CREATININE: 1.3 mg/dL — AB (ref 0.44–1.00)
Chloride: 102 mmol/L (ref 101–111)
GFR, EST NON AFRICAN AMERICAN: 58 mL/min — AB (ref >60)
Glucose, Bld: 109 mg/dL — ABNORMAL HIGH (ref 65–99)
Potassium: 3.9 mmol/L (ref 3.5–5.1)
Sodium: 131 mmol/L — ABNORMAL LOW (ref 135–145)
TOTAL PROTEIN: 7.5 g/dL (ref 6.5–8.1)

## 2016-05-14 LAB — CBC WITH DIFFERENTIAL/PLATELET
BASOS ABS: 0 10*3/uL (ref 0.0–0.1)
BASOS PCT: 0 %
EOS ABS: 0 10*3/uL (ref 0.0–0.7)
Eosinophils Relative: 0 %
HCT: 23.7 % — ABNORMAL LOW (ref 36.0–46.0)
HEMOGLOBIN: 8.6 g/dL — AB (ref 12.0–15.0)
LYMPHS PCT: 2 %
Lymphs Abs: 0.9 10*3/uL (ref 0.7–4.0)
MCH: 34.5 pg — AB (ref 26.0–34.0)
MCHC: 36.3 g/dL — AB (ref 30.0–36.0)
MCV: 95.2 fL (ref 78.0–100.0)
MONO ABS: 1.3 10*3/uL — AB (ref 0.1–1.0)
Monocytes Relative: 3 %
NEUTROS PCT: 95 %
Neutro Abs: 42.7 10*3/uL — ABNORMAL HIGH (ref 1.7–7.7)
PLATELETS: 409 10*3/uL — AB (ref 150–400)
RBC: 2.49 MIL/uL — ABNORMAL LOW (ref 3.87–5.11)
RDW: 14.9 % (ref 11.5–15.5)
WBC: 44.9 10*3/uL — ABNORMAL HIGH (ref 4.0–10.5)

## 2016-05-14 LAB — I-STAT CG4 LACTIC ACID, ED: Lactic Acid, Venous: 1.12 mmol/L (ref 0.5–1.9)

## 2016-05-14 LAB — I-STAT BETA HCG BLOOD, ED (MC, WL, AP ONLY): HCG, QUANTITATIVE: 24.9 m[IU]/mL — AB (ref <5)

## 2016-05-14 LAB — URINE MICROSCOPIC-ADD ON

## 2016-05-14 LAB — RETICULOCYTES
RBC.: 2.49 MIL/uL — ABNORMAL LOW (ref 3.87–5.11)
Retic Count, Absolute: 191.7 10*3/uL — ABNORMAL HIGH (ref 19.0–186.0)
Retic Ct Pct: 7.7 % — ABNORMAL HIGH (ref 0.4–3.1)

## 2016-05-14 MED ORDER — ONDANSETRON HCL 4 MG/2ML IJ SOLN
4.0000 mg | Freq: Four times a day (QID) | INTRAMUSCULAR | Status: DC | PRN
  Administered 2016-05-16 – 2016-05-17 (×3): 4 mg via INTRAVENOUS
  Filled 2016-05-14 (×3): qty 2

## 2016-05-14 MED ORDER — VANCOMYCIN HCL 500 MG IV SOLR
500.0000 mg | Freq: Three times a day (TID) | INTRAVENOUS | Status: DC
  Filled 2016-05-14: qty 500

## 2016-05-14 MED ORDER — ACETAMINOPHEN 325 MG PO TABS
ORAL_TABLET | ORAL | Status: AC
  Filled 2016-05-14: qty 2

## 2016-05-14 MED ORDER — HYDROMORPHONE HCL 1 MG/ML IJ SOLN
1.0000 mg | Freq: Once | INTRAMUSCULAR | Status: AC
  Administered 2016-05-14: 1 mg via INTRAVENOUS
  Filled 2016-05-14: qty 1

## 2016-05-14 MED ORDER — PIPERACILLIN-TAZOBACTAM 3.375 G IVPB 30 MIN
3.3750 g | Freq: Once | INTRAVENOUS | Status: AC
Start: 2016-05-14 — End: 2016-05-14
  Administered 2016-05-14: 3.375 g via INTRAVENOUS
  Filled 2016-05-14: qty 50

## 2016-05-14 MED ORDER — SENNOSIDES-DOCUSATE SODIUM 8.6-50 MG PO TABS
1.0000 | ORAL_TABLET | Freq: Two times a day (BID) | ORAL | Status: DC
  Administered 2016-05-14 – 2016-05-18 (×7): 1 via ORAL
  Filled 2016-05-14 (×8): qty 1

## 2016-05-14 MED ORDER — PIPERACILLIN-TAZOBACTAM 3.375 G IVPB
3.3750 g | Freq: Three times a day (TID) | INTRAVENOUS | Status: DC
  Filled 2016-05-14: qty 50

## 2016-05-14 MED ORDER — VANCOMYCIN HCL IN DEXTROSE 1-5 GM/200ML-% IV SOLN
1000.0000 mg | Freq: Once | INTRAVENOUS | Status: DC
  Administered 2016-05-14: 1000 mg via INTRAVENOUS
  Filled 2016-05-14: qty 200

## 2016-05-14 MED ORDER — ONDANSETRON HCL 4 MG/2ML IJ SOLN
4.0000 mg | Freq: Once | INTRAMUSCULAR | Status: AC
Start: 2016-05-14 — End: 2016-05-14
  Administered 2016-05-14: 4 mg via INTRAVENOUS
  Filled 2016-05-14: qty 2

## 2016-05-14 MED ORDER — ACETAMINOPHEN 325 MG PO TABS
650.0000 mg | ORAL_TABLET | Freq: Four times a day (QID) | ORAL | Status: DC | PRN
  Administered 2016-05-15 – 2016-05-16 (×4): 650 mg via ORAL
  Filled 2016-05-14 (×4): qty 2

## 2016-05-14 MED ORDER — SODIUM CHLORIDE 0.9 % IV SOLN
INTRAVENOUS | Status: AC
  Administered 2016-05-14: 21:00:00 via INTRAVENOUS

## 2016-05-14 MED ORDER — SODIUM CHLORIDE 0.9 % IV BOLUS (SEPSIS)
500.0000 mL | Freq: Once | INTRAVENOUS | Status: AC
  Administered 2016-05-14: 500 mL via INTRAVENOUS

## 2016-05-14 MED ORDER — ONDANSETRON HCL 4 MG PO TABS: 4.0000 mg | ORAL_TABLET | Freq: Four times a day (QID) | ORAL | Status: DC | PRN

## 2016-05-14 MED ORDER — ACETAMINOPHEN 650 MG RE SUPP: 650.0000 mg | Freq: Four times a day (QID) | RECTAL | Status: DC | PRN

## 2016-05-14 MED ORDER — VANCOMYCIN HCL IN DEXTROSE 1-5 GM/200ML-% IV SOLN: 1000.0000 mg | INTRAVENOUS | Status: DC

## 2016-05-14 MED ORDER — SODIUM CHLORIDE 0.9 % IV SOLN
INTRAVENOUS | Status: AC
  Administered 2016-05-14 – 2016-05-15 (×2): via INTRAVENOUS

## 2016-05-14 MED ORDER — FOLIC ACID 1 MG PO TABS
1.0000 mg | ORAL_TABLET | Freq: Every day | ORAL | Status: DC
  Administered 2016-05-14 – 2016-05-18 (×4): 1 mg via ORAL
  Filled 2016-05-14 (×5): qty 1

## 2016-05-14 MED ORDER — MORPHINE SULFATE (PF) 4 MG/ML IV SOLN
4.0000 mg | INTRAVENOUS | Status: DC | PRN
  Administered 2016-05-14 – 2016-05-18 (×11): 4 mg via INTRAVENOUS
  Filled 2016-05-14 (×11): qty 1

## 2016-05-14 MED ORDER — ACETAMINOPHEN 325 MG PO TABS
650.0000 mg | ORAL_TABLET | Freq: Once | ORAL | Status: AC | PRN
  Administered 2016-05-14: 650 mg via ORAL

## 2016-05-14 MED ORDER — ENOXAPARIN SODIUM 40 MG/0.4ML ~~LOC~~ SOLN
40.0000 mg | SUBCUTANEOUS | Status: DC
  Administered 2016-05-14 – 2016-05-17 (×4): 40 mg via SUBCUTANEOUS
  Filled 2016-05-14 (×4): qty 0.4

## 2016-05-14 MED ORDER — DEXTROSE 5 % IV SOLN
2.0000 g | INTRAVENOUS | Status: DC
  Administered 2016-05-14 – 2016-05-15 (×2): 2 g via INTRAVENOUS
  Filled 2016-05-14 (×4): qty 2

## 2016-05-14 MED ORDER — HYDROCODONE-ACETAMINOPHEN 10-325 MG PO TABS
1.0000 | ORAL_TABLET | ORAL | Status: DC | PRN
  Administered 2016-05-15 – 2016-05-17 (×7): 1 via ORAL
  Filled 2016-05-14 (×8): qty 1

## 2016-05-14 NOTE — ED Provider Notes (Signed)
CSN: [id number]     Arrival date & time 05/14/16  1829 History   First MD Initiated Contact with Patient 05/14/16 1935     Chief Complaint  Patient presents with  . Flank Pain  . Emesis     (Consider location/radiation/quality/duration/timing/severity/associated sxs/prior Treatment) Patient is a 20 y.o. female presenting with flank pain and vomiting. The history is provided by the patient.  Flank Pain Pertinent negatives include no chest pain, no abdominal pain, no headaches and no shortness of breath.  Emesis Associated symptoms: myalgias   Associated symptoms: no abdominal pain, no headaches and no sore throat   Patient c/o onset right sided abdomen/flank pain for the past 2 days, states hx sickle cell and thinks may be having a crisis. Also c/o diffuse body pain/aches.  Pain moderate, severe, constant, without specific exacerbating or alleviating factors. Subjective fever. Denies hx same symptoms. ?mild dysuria/urgency. Denies headache. No neck pain or stiffness. No cough or sore throat. No chest pain or sob.  Nausea. No vomiting. No diarrhea. No rash.       Past Medical History  Diagnosis Date  . Asthma   . Sickle cell disease ([place])   . Amenorrhea   . Hyperbilirubinemia 02/26/2015  . Drug-induced pruritus 02/26/2015  . GERD (gastroesophageal reflux disease)   . Headache   . Anemia     SICKLE CELL   Past Surgical History  Procedure Laterality Date  . 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 Tonsillectomy      Age 37   Family History  Problem Relation Age of Onset  . Hypertension Paternal Grandfather   . Sickle cell trait Father   . Cancer Mother     Died in 02-09-2009   Social History  Substance Use Topics  . Smoking status: Current Some Day Smoker -- 0.50 packs/day    Types: Cigarettes  . Smokeless tobacco: Never Used  . Alcohol Use: No     Comment: Denies 06/12/2014   OB History    No data available     Review of Systems  Constitutional: Positive for  fever.  HENT: Negative for sore throat.   Eyes: Negative for pain and visual disturbance.  Respiratory: Negative for cough and shortness of breath.   Cardiovascular: Negative for chest pain.  Gastrointestinal: Positive for nausea and vomiting. Negative for abdominal pain.  Endocrine: Negative for polyuria.  Genitourinary: Positive for dysuria and flank pain.  Musculoskeletal: Positive for myalgias. Negative for neck pain and neck stiffness.  Skin: Negative for rash.  Neurological: Negative for headaches.  Hematological: Does not bruise/bleed easily.  Psychiatric/Behavioral: Negative for confusion.      Allergies  Review of patient's allergies indicates no known allergies.  Home Medications   Prior to Admission medications   Medication Sig Start Date End Date Taking? Authorizing Provider  folic acid (FOLVITE) 1 MG tablet Take 1 tablet (1 mg total) by mouth daily. 03/11/15   Vivi Barrack, MD  Hydrocodone-Acetaminophen (VICODIN HP) 10-300 MG TABS Take 1 tablet by mouth every 4 hours for 2 days. Then take every 4 hours PRN for 2 days. 11/08/15   Sela Hua, MD  hydrocortisone 1 % lotion Apply 1 application topically 2 (two) times daily. Patient not taking: Reported on 02/25/2015 12/01/14   Alvina Chou, PA-C  ibuprofen (ADVIL,MOTRIN) 200 MG tablet Take 1,000 mg by mouth every 6 (six) hours as needed for headache or moderate pain.    Historical Provider, MD  ondansetron (  ZOFRAN ODT) 4 MG disintegrating tablet Take 1 tablet (4 mg total) by mouth every 8 (eight) hours as needed for nausea or vomiting. Patient not taking: Reported on 02/25/2015 12/01/14   Alvina Chou, PA-C  senna-docusate (SENOKOT-S) 8.6-50 MG tablet Take 1 tablet by mouth 2 (two) times daily. 08/15/15   Alyssa A Haney, MD   BP 111/64 mmHg  Pulse 132  Temp(Src) 100.3 F (37.9 C) (Oral)  Resp 20  Ht 5\' 4"  (1.626 m)  Wt 49.079 kg  BMI 18.56 kg/m2  SpO2 95%  LMP 04/23/2016 (Within Days) Physical Exam   Constitutional:  Thin, uncomfortable appearing, low grade fever.   HENT:  Nose: Nose normal.  Mouth/Throat: Oropharynx is clear and moist.  Eyes: Pupils are equal, round, and reactive to light. No scleral icterus.  Scleral icterus (pt notes is chronic/congenital, denies acute change).  Neck: Neck supple. No tracheal deviation present.  No stiffness or rigidity  Cardiovascular: Regular rhythm, normal heart sounds and intact distal pulses.  Exam reveals no gallop and no friction rub.   No murmur heard. Tachycardic.   Pulmonary/Chest: Effort normal and breath sounds normal. No respiratory distress.  Abdominal: Soft. Normal appearance and bowel sounds are normal. She exhibits no distension and no mass. There is tenderness. There is no rebound and no guarding.  Right abd tenderness.   Genitourinary:  Right cva tenderness.   Musculoskeletal: She exhibits no edema or tenderness.  Neurological: She is alert.  Skin: Skin is warm and dry. No rash noted.  Psychiatric: She has a normal mood and affect.  Nursing note and vitals reviewed.   ED Course  Procedures (including critical care time) Labs Review  Results for orders placed or performed during the hospital encounter of 05/14/16  CBC with Differential  Result Value Ref Range   WBC 44.9 (H) 4.0 - 10.5 K/uL   RBC 2.49 (L) 3.87 - 5.11 MIL/uL   Hemoglobin 8.6 (L) 12.0 - 15.0 g/dL   HCT 23.7 (L) 36.0 - 46.0 %   MCV 95.2 78.0 - 100.0 fL   MCH 34.5 (H) 26.0 - 34.0 pg   MCHC 36.3 (H) 30.0 - 36.0 g/dL   RDW 14.9 11.5 - 15.5 %   Platelets 409 (H) 150 - 400 K/uL   Neutrophils Relative % PENDING %   Neutro Abs PENDING 1.7 - 7.7 K/uL   Band Neutrophils PENDING %   Lymphocytes Relative PENDING %   Lymphs Abs PENDING 0.7 - 4.0 K/uL   Monocytes Relative PENDING %   Monocytes Absolute PENDING 0.1 - 1.0 K/uL   Eosinophils Relative PENDING %   Eosinophils Absolute PENDING 0.0 - 0.7 K/uL   Basophils Relative PENDING %   Basophils Absolute  PENDING 0.0 - 0.1 K/uL   WBC Morphology PENDING    RBC Morphology PENDING    Smear Review PENDING    nRBC PENDING 0 /100 WBC   Metamyelocytes Relative PENDING %   Myelocytes PENDING %   Promyelocytes Absolute PENDING %   Blasts PENDING %  Urinalysis, Routine w reflex microscopic  Result Value Ref Range   Color, Urine ORANGE (A) YELLOW   APPearance CLOUDY (A) CLEAR   Specific Gravity, Urine 1.015 1.005 - 1.030   pH 5.5 5.0 - 8.0   Glucose, UA NEGATIVE NEGATIVE mg/dL   Hgb urine dipstick SMALL (A) NEGATIVE   Bilirubin Urine SMALL (A) NEGATIVE   Ketones, ur NEGATIVE NEGATIVE mg/dL   Protein, ur 100 (A) NEGATIVE mg/dL   Nitrite NEGATIVE NEGATIVE  Leukocytes, UA LARGE (A) NEGATIVE  Reticulocytes  Result Value Ref Range   Retic Ct Pct 7.7 (H) 0.4 - 3.1 %   RBC. 2.49 (L) 3.87 - 5.11 MIL/uL   Retic Count, Manual 191.7 (H) 19.0 - 186.0 K/uL  Urine microscopic-add on  Result Value Ref Range   Squamous Epithelial / LPF 6-30 (A) NONE SEEN   WBC, UA TOO NUMEROUS TO COUNT 0 - 5 WBC/hpf   RBC / HPF 0-5 0 - 5 RBC/hpf   Bacteria, UA FEW (A) NONE SEEN  I-Stat beta hCG blood, ED  Result Value Ref Range   I-stat hCG, quantitative 24.9 (H) <5 mIU/mL   Comment 3          I-Stat CG4 Lactic Acid, ED  Result Value Ref Range   Lactic Acid, Venous 1.12 0.5 - 1.9 mmol/L     I have personally reviewed and evaluated these images and lab results as part of my medical decision-making.    MDM   Iv ns 500 cc bolus.  Labs, cultures.  Iv abx.  Dilaudid 1 mg iv for pain. zofran for nausea.  Family practice service contacted for admission.   Discussed w [facility]pc roc who requests temp orders to med surg bed, Dr Wendy Poet.       Lajean Saver, MD 05/14/16 [phone]

## 2016-05-14 NOTE — ED Notes (Addendum)
Pt here with c/o right side flank pain, emesis and generalized weakness and shortness of breath since Friday. Hx of sickle cell anemia. Pt appears jaundice and is tachycardic in triage.

## 2016-05-14 NOTE — ED Notes (Signed)
Report called to rn on 6e 

## 2016-05-14 NOTE — Progress Notes (Addendum)
ANTIBIOTIC CONSULT NOTE - INITIAL  Pharmacy Consult for zosyn/vancomycin Indication: sepsis  No Known Allergies  Patient Measurements: Height: 5\' 4"  (162.6 cm) Weight: 108 lb 3.2 oz (49.079 kg) IBW/kg (Calculated) : 54.7  Vital Signs: Temp: 100.3 F (37.9 C) (07/16 1835) Temp Source: Oral (07/16 1835) BP: 111/64 mmHg (07/16 1835) Pulse Rate: 132 (07/16 1835) Intake/Output from previous day:   Intake/Output from this shift:    Labs:  Recent Labs  05/14/16 1830  WBC 44.9*  HGB 8.6*  PLT 409*   CrCl cannot be calculated (Patient has no serum creatinine result on file.). No results for input(s): VANCOTROUGH, VANCOPEAK, VANCORANDOM, GENTTROUGH, GENTPEAK, GENTRANDOM, TOBRATROUGH, TOBRAPEAK, TOBRARND, AMIKACINPEAK, AMIKACINTROU, AMIKACIN in the last 72 hours.   Microbiology: No results found for this or any previous visit (from the past 720 hour(s)).  Medical History: Past Medical History  Diagnosis Date  . Asthma   . Sickle cell disease ([place])   . Amenorrhea   . Hyperbilirubinemia 02/26/2015  . Drug-induced pruritus 02/26/2015  . GERD (gastroesophageal reflux disease)   . Headache   . Anemia     SICKLE CELL    Assessment: 20 yo female with r/o sepsis to begin zosyn/vancomycin per pharmacy.  -WBC= 44.9, tmax= 100.3 -SCr= 0.59 in 10/2015 -vancomycin 1000mg  and zosyn 3.375gm IV ordered in ED  7/16 zosyn>> 7/16 vanc>>  7/16 blood x2 7/16 urine  Goal of Therapy:  Vancomycin trough level 15-20 mcg/ml  Plan:  -Zosyn 3.375gm IV q8h -Vancomycin 500mg  IV q8h -Will follow renal function, cultures and clinical progress  Hildred Laser, Pharm D 05/14/2016 7:45 PM   Addendum: Scr returned and is elevated. Will change maintenance vancomycin dose to 1gm IV Q24H.  Salome Arnt, PharmD, BCPS Pager # [phone] 05/14/2016 8:19 PM

## 2016-05-14 NOTE — ED Notes (Signed)
The pt is whimpering and has asked approx 10 times if she could eat. She wants a remote for the tv and she wants something for pain  Iv placed pain med given  Insisting on food  She wants to talk to the doctor and she wants another nurse because i will not give her food yet.

## 2016-05-14 NOTE — H&P (Signed)
[facility] Admission History and Physical Service Pager: [phone]  Patient name: [name] Medical record number: [id number] Date of birth: [birth year] Age: 20 y.o. Gender: female  Primary Care Provider: Dimas Chyle, MD Consultants: None Code Status: Full  Chief Complaint: Myalgias  Assessment and Plan: [name] is a 20 y.o. female presenting with pyelonephritis. PMH is significant for sickle cell disease SS, tobacco abuse, intermittent asthma  Fever / Flank pain 2/2 pyelonephritis. Meets 2/4 SIRS on admission. qSOFA 0. UA consistent large leukocytes and few bacteria. WBC 44.9 and temperature of 100.9F on admission. No history of nephrolithiasis.  - Vanc/zosyn given in ED - Narrow to CTX (7/16- ) - NS @ 75cc/hr - Blood and Urine culture pending - Continue home norco for pain with morphine for breakthrough pain - Zofran prn nausea  Elevated beta hcg. Beta hcg elevated to 24.9 on admission. Unclear if patient actually pregnant or if this is a false positive result. LMP on 04/23/2016. There have been reports of hcg being falsely elevated in cases of e coli bacteremia - may the source of her result - Will check urine quantitative hcg - Recheck plasma quantitative hcg in 2 days.   AKI. Cr 1.3 on admission. Baseline <1. Likely secondary to pyelonephritis and dehydration. Patient at higher risk for renal insult given history of sickle cell disease. - Trend BMP - Avoid NSAIDs  Weight Loss. Patient noted to have weight loss of 33lb over the past 6 months. Per patient, the place she is living in now "does not treat me very well." Patient reports that she had not eaten in several days prior to admission.  - HIV pending - CSW consult  Sickle Cell Anemia. Hgb 8.6 on admission. Baseline 7-9. Good retic response. Does not appear to be in an active pain crisis. No signs of acute chest syndrome on CXR.  - Trend CBC - Continue home vicodin -  IS not currently on hydroxyurea - Needs follow up with hematology after discharge  Intermittent Asthma. Stable - Albuterol as needed  FEN/GI: Regular diet, NS @75cc /hr Prophylaxis: Lovenox  Disposition: Admitted pending above management. Anticipate discharge home once stable on PO antibiotics.   History of Present Illness:  [name] is a 20 y.o. female presenting with myalgias.  Symptoms started 2-3 days ago with pain in her right flank and back. Initially thought that she was having a sickle cell crisis. This developed into generalized myalgias and pains over the last couple of days. Patient has not noticed anything that makes the pain better or worse. No nausea or vomiting. Patient reports increased urinary frequency and dysuria over the past several days. No diarrhea or constipation. No chest pain or shortness of breath.   Review Of Systems: Per HPI, Otherwise the remainder of the systems were negative.  Patient Active Problem List   Diagnosis Date Noted  . Leucocytosis 05/14/2016  . Pyelonephritis 05/14/2016  . Imprisonment and other incarceration   . Bibasilar crackles   . 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 Acute sickle cell crisis ([place]) 08/11/2015  . 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 Suicide ideation 03/11/2015  . Psychomotor agitation 03/11/2015  . Sickle cell anemia with pain ([place]) 03/10/2015  . Vasoocclusive sickle cell crisis ([place])   . Hyperbilirubinemia 02/26/2015  . Drug-induced pruritus 02/26/2015  . Sickle cell anemia with crisis ([place]) 02/25/2015  . Tobacco abuse   . Sickle cell crisis ([place])   . Leukocytosis   .  Hematochezia 02/27/2014  . UTI (lower urinary tract infection) 02/26/2014  . Vaginal discharge 02/24/2014  . Sickle cell pain crisis ([place]) 11/23/2013  . Facial rash 03/25/2013  . Birth control counseling 08/14/2012  . Asthma, mild intermittent 12/14/2010  . Hb-SS disease with crisis ([place]) 10/12/2009     Past Medical History: Past Medical History  Diagnosis Date  . Asthma   . Sickle cell disease ([place])   . Amenorrhea   . Hyperbilirubinemia 02/26/2015  . Drug-induced pruritus 02/26/2015  . GERD (gastroesophageal reflux disease)   . Headache   . Anemia     SICKLE CELL    Past Surgical History: Past Surgical History  Procedure Laterality Date  . 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 Tonsillectomy      Age 48    Social History: Social History  Substance Use Topics  . Smoking status: Current Some Day Smoker -- 0.50 packs/day    Types: Cigarettes  . Smokeless tobacco: Never Used  . Alcohol Use: No     Comment: Denies 06/12/2014   Please also refer to relevant sections of EMR.  Family History: Family History  Problem Relation Age of Onset  . Hypertension Paternal Grandfather   . Sickle cell trait Father   . Cancer Mother     Died in 2009/02/13   Allergies and Medications: No Known Allergies No current facility-administered medications on file prior to encounter.   Current Outpatient Prescriptions on File Prior to Encounter  Medication Sig Dispense Refill  . folic acid (FOLVITE) 1 MG tablet Take 1 tablet (1 mg total) by mouth daily. 30 tablet 0  . Hydrocodone-Acetaminophen (VICODIN HP) 10-300 MG TABS Take 1 tablet by mouth every 4 hours for 2 days. Then take every 4 hours PRN for 2 days. 24 each 0  . hydrocortisone 1 % lotion Apply 1 application topically 2 (two) times daily. (Patient not taking: Reported on 02/25/2015) 118 mL 0  . ibuprofen (ADVIL,MOTRIN) 200 MG tablet Take 1,000 mg by mouth every 6 (six) hours as needed for headache or moderate pain.    Marland Kitchen ondansetron (ZOFRAN ODT) 4 MG disintegrating tablet Take 1 tablet (4 mg total) by mouth every 8 (eight) hours as needed for nausea or vomiting. (Patient not taking: Reported on 02/25/2015) 10 tablet 0  . senna-docusate (SENOKOT-S) 8.6-50 MG tablet Take 1 tablet by mouth 2 (two) times daily. 30 tablet 3     Objective: BP 103/50 mmHg  Pulse 103  Temp(Src) 98.9 F (37.2 C) (Oral)  Resp 19  Ht 5\' 4"  (1.626 m)  Wt 112 lb 12.8 oz (51.166 kg)  BMI 19.35 kg/m2  SpO2 100%  LMP 04/23/2016 (Within Days) Exam: General: thin, ill appearing 20 year old female lying in hospital bed Eyes: Scleral icterus noted, PERRL, EOMI ENTM: Dry MM, O/P clear Neck: S, NT, ND Cardiovascular: RRR, no murmurs noted Respiratory: NWOB, CTAB  Abdomen: +BS, S, NT, ND MSK: WWP, No cyanosis or edema, mild right CVA tenderness noted Skin: [place], dry Neuro: Alert and oriented. No focal deficits Psych: Appropriate mood and affect. Normal thought content.   Labs and Imaging: CBC BMET   Recent Labs Lab 05/14/16 1830  WBC 44.9*  HGB 8.6*  HCT 23.7*  PLT 409*    Recent Labs Lab 05/14/16 1830  NA 131*  K 3.9  CL 102  CO2 21*  BUN 21*  CREATININE 1.30*  GLUCOSE 109*  CALCIUM 9.0     Quantitative hcg 24.9  Lactic acid 1.12  Urinalysis    Component Value Date/Time   COLORURINE ORANGE* 05/14/2016 1901   APPEARANCEUR CLOUDY* 05/14/2016 1901   LABSPEC 1.015 05/14/2016 1901   PHURINE 5.5 05/14/2016 1901   GLUCOSEU NEGATIVE 05/14/2016 1901   HGBUR SMALL* 05/14/2016 1901   BILIRUBINUR SMALL* 05/14/2016 1901   KETONESUR NEGATIVE 05/14/2016 1901   PROTEINUR 100* 05/14/2016 1901   UROBILINOGEN 1.0 08/11/2015 1948   NITRITE NEGATIVE 05/14/2016 1901   LEUKOCYTESUR LARGE* 05/14/2016 1901   Dg Chest 2 View  05/14/2016  CLINICAL DATA:  20 year old female with shortness of breath for 2 days. History of sickle cell disease. EXAM: CHEST  2 VIEW COMPARISON:  11/04/2015 and prior exams FINDINGS: Mild cardiomegaly again noted. Mild left basilar opacity identified -question atelectasis versus early airspace disease. There is no evidence of consolidation, mass, pleural effusion or pneumothorax. No acute bony abnormalities are present. IMPRESSION: Mild left basilar opacity -question atelectasis versus early airspace  disease. Mild cardiomegaly. Electronically Signed   By: Margarette Canada M.D.   On: 05/14/2016 19:51   Vivi Barrack, MD 05/14/2016, 9:29 PM PGY-3, [place] Intern pager: [phone], text pages welcome

## 2016-05-14 NOTE — ED Notes (Signed)
Waiting for blood cultures to be drawn before i start her antibiotics

## 2016-05-15 ENCOUNTER — Inpatient Hospital Stay (HOSPITAL_COMMUNITY): Payer: Medicare Other

## 2016-05-15 ENCOUNTER — Encounter (HOSPITAL_COMMUNITY): Payer: Self-pay | Admitting: Family Medicine

## 2016-05-15 DIAGNOSIS — N179 Acute kidney failure, unspecified: Secondary | ICD-10-CM

## 2016-05-15 DIAGNOSIS — D571 Sickle-cell disease without crisis: Secondary | ICD-10-CM

## 2016-05-15 DIAGNOSIS — I9589 Other hypotension: Secondary | ICD-10-CM

## 2016-05-15 DIAGNOSIS — R6520 Severe sepsis without septic shock: Secondary | ICD-10-CM

## 2016-05-15 DIAGNOSIS — N10 Acute tubulo-interstitial nephritis: Secondary | ICD-10-CM

## 2016-05-15 DIAGNOSIS — A4150 Gram-negative sepsis, unspecified: Secondary | ICD-10-CM

## 2016-05-15 HISTORY — DX: Acute kidney failure, unspecified: N17.9

## 2016-05-15 LAB — BLOOD CULTURE ID PANEL (REFLEXED)
ACINETOBACTER BAUMANNII: NOT DETECTED
CANDIDA KRUSEI: NOT DETECTED
CANDIDA PARAPSILOSIS: NOT DETECTED
CARBAPENEM RESISTANCE: NOT DETECTED
Candida albicans: NOT DETECTED
Candida glabrata: NOT DETECTED
Candida tropicalis: NOT DETECTED
ENTEROCOCCUS SPECIES: NOT DETECTED
Enterobacter cloacae complex: NOT DETECTED
Enterobacteriaceae species: DETECTED — AB
Escherichia coli: DETECTED — AB
Haemophilus influenzae: NOT DETECTED
KLEBSIELLA PNEUMONIAE: NOT DETECTED
Klebsiella oxytoca: NOT DETECTED
LISTERIA MONOCYTOGENES: NOT DETECTED
Methicillin resistance: NOT DETECTED
Neisseria meningitidis: NOT DETECTED
Proteus species: NOT DETECTED
Pseudomonas aeruginosa: NOT DETECTED
SERRATIA MARCESCENS: NOT DETECTED
STAPHYLOCOCCUS AUREUS BCID: NOT DETECTED
STAPHYLOCOCCUS SPECIES: NOT DETECTED
Streptococcus agalactiae: NOT DETECTED
Streptococcus pneumoniae: NOT DETECTED
Streptococcus pyogenes: NOT DETECTED
Streptococcus species: NOT DETECTED
VANCOMYCIN RESISTANCE: NOT DETECTED

## 2016-05-15 LAB — CBC WITH DIFFERENTIAL/PLATELET
BASOS PCT: 0 %
Basophils Absolute: 0 10*3/uL (ref 0.0–0.1)
EOS PCT: 0 %
Eosinophils Absolute: 0 10*3/uL (ref 0.0–0.7)
HEMATOCRIT: 15.8 % — AB (ref 36.0–46.0)
HEMOGLOBIN: 5.8 g/dL — AB (ref 12.0–15.0)
LYMPHS PCT: 3 %
Lymphs Abs: 1.1 10*3/uL (ref 0.7–4.0)
MCH: 34.1 pg — ABNORMAL HIGH (ref 26.0–34.0)
MCHC: 36.7 g/dL — ABNORMAL HIGH (ref 30.0–36.0)
MCV: 92.9 fL (ref 78.0–100.0)
Monocytes Absolute: 1.1 10*3/uL — ABNORMAL HIGH (ref 0.1–1.0)
Monocytes Relative: 3 %
NEUTROS PCT: 94 %
Neutro Abs: 32.9 10*3/uL — ABNORMAL HIGH (ref 1.7–7.7)
PLATELETS: 362 10*3/uL (ref 150–400)
RBC: 1.7 MIL/uL — ABNORMAL LOW (ref 3.87–5.11)
RDW: 15.4 % (ref 11.5–15.5)
SMEAR REVIEW: ADEQUATE
WBC: 35.1 10*3/uL — ABNORMAL HIGH (ref 4.0–10.5)

## 2016-05-15 LAB — PROTIME-INR
INR: 1.62 — ABNORMAL HIGH (ref 0.00–1.49)
Prothrombin Time: 19.3 seconds — ABNORMAL HIGH (ref 11.6–15.2)

## 2016-05-15 LAB — COMPREHENSIVE METABOLIC PANEL
ALK PHOS: 56 U/L (ref 38–126)
ALT: 36 U/L (ref 14–54)
AST: 24 U/L (ref 15–41)
Albumin: 2.6 g/dL — ABNORMAL LOW (ref 3.5–5.0)
Anion gap: 4 — ABNORMAL LOW (ref 5–15)
BILIRUBIN TOTAL: 13.4 mg/dL — AB (ref 0.3–1.2)
BUN: 20 mg/dL (ref 6–20)
CALCIUM: 7.7 mg/dL — AB (ref 8.9–10.3)
CHLORIDE: 110 mmol/L (ref 101–111)
CO2: 20 mmol/L — ABNORMAL LOW (ref 22–32)
CREATININE: 1.57 mg/dL — AB (ref 0.44–1.00)
GFR calc Af Amer: 54 mL/min — ABNORMAL LOW (ref >60)
GFR, EST NON AFRICAN AMERICAN: 46 mL/min — AB (ref >60)
Glucose, Bld: 106 mg/dL — ABNORMAL HIGH (ref 65–99)
Potassium: 3.8 mmol/L (ref 3.5–5.1)
Sodium: 134 mmol/L — ABNORMAL LOW (ref 135–145)
Total Protein: 5.8 g/dL — ABNORMAL LOW (ref 6.5–8.1)

## 2016-05-15 LAB — CBC
HCT: 20 % — ABNORMAL LOW (ref 36.0–46.0)
HEMATOCRIT: 18.1 % — AB (ref 36.0–46.0)
HEMOGLOBIN: 7.2 g/dL — AB (ref 12.0–15.0)
Hemoglobin: 6.4 g/dL — CL (ref 12.0–15.0)
MCH: 34 pg (ref 26.0–34.0)
MCH: 32.2 pg (ref 26.0–34.0)
MCHC: 36 g/dL (ref 30.0–36.0)
MCHC: 35.4 g/dL (ref 30.0–36.0)
MCV: 94.3 fL (ref 78.0–100.0)
MCV: 91 fL (ref 78.0–100.0)
PLATELETS: 336 10*3/uL (ref 150–400)
Platelets: 389 10*3/uL (ref 150–400)
RBC: 2.12 MIL/uL — ABNORMAL LOW (ref 3.87–5.11)
RBC: 1.99 MIL/uL — AB (ref 3.87–5.11)
RDW: 14.8 % (ref 11.5–15.5)
RDW: 15.3 % (ref 11.5–15.5)
WBC: 38.7 10*3/uL — ABNORMAL HIGH (ref 4.0–10.5)
WBC: 26.1 10*3/uL — AB (ref 4.0–10.5)

## 2016-05-15 LAB — PROCALCITONIN: Procalcitonin: 30.07 ng/mL

## 2016-05-15 LAB — HIV ANTIBODY (ROUTINE TESTING W REFLEX): HIV SCREEN 4TH GENERATION: NONREACTIVE

## 2016-05-15 LAB — BASIC METABOLIC PANEL
ANION GAP: 6 (ref 5–15)
BUN: 23 mg/dL — AB (ref 6–20)
CALCIUM: 8 mg/dL — AB (ref 8.9–10.3)
CO2: 20 mmol/L — AB (ref 22–32)
CREATININE: 1.47 mg/dL — AB (ref 0.44–1.00)
Chloride: 105 mmol/L (ref 101–111)
GFR calc Af Amer: 58 mL/min — ABNORMAL LOW (ref >60)
GFR, EST NON AFRICAN AMERICAN: 50 mL/min — AB (ref >60)
GLUCOSE: 108 mg/dL — AB (ref 65–99)
Potassium: 3.7 mmol/L (ref 3.5–5.1)
Sodium: 131 mmol/L — ABNORMAL LOW (ref 135–145)

## 2016-05-15 LAB — LACTIC ACID, PLASMA
LACTIC ACID, VENOUS: 0.7 mmol/L (ref 0.5–1.9)
Lactic Acid, Venous: 0.9 mmol/L (ref 0.5–1.9)

## 2016-05-15 LAB — MRSA PCR SCREENING: MRSA BY PCR: NEGATIVE

## 2016-05-15 LAB — PREPARE RBC (CROSSMATCH)

## 2016-05-15 LAB — HCG, QUANTITATIVE, PREGNANCY: hCG, Beta Chain, Quant, S: 2 m[IU]/mL (ref <5)

## 2016-05-15 LAB — APTT: APTT: 47 s — AB (ref 24–37)

## 2016-05-15 MED ORDER — SODIUM CHLORIDE 0.45 % IV SOLN
INTRAVENOUS | Status: AC
  Administered 2016-05-15: 10:00:00 via INTRAVENOUS
  Administered 2016-05-15: 100 mL via INTRAVENOUS

## 2016-05-15 MED ORDER — SODIUM CHLORIDE 0.9 % IV BOLUS (SEPSIS)
1000.0000 mL | Freq: Once | INTRAVENOUS | Status: AC
  Administered 2016-05-15: 1000 mL via INTRAVENOUS

## 2016-05-15 MED ORDER — ENSURE ENLIVE PO LIQD
237.0000 mL | Freq: Two times a day (BID) | ORAL | Status: DC
  Administered 2016-05-16 – 2016-05-18 (×2): 237 mL via ORAL

## 2016-05-15 MED ORDER — SODIUM CHLORIDE 0.9 % IV BOLUS (SEPSIS)
500.0000 mL | Freq: Once | INTRAVENOUS | Status: DC
Start: 2016-05-15 — End: 2016-05-18

## 2016-05-15 MED ORDER — SODIUM CHLORIDE 0.9 % IV SOLN
Freq: Once | INTRAVENOUS | Status: AC
  Administered 2016-05-15: 19:00:00 via INTRAVENOUS

## 2016-05-15 MED ORDER — SODIUM CHLORIDE 0.9 % IV BOLUS (SEPSIS): 250.0000 mL | Freq: Once | INTRAVENOUS | Status: DC

## 2016-05-15 MED ORDER — SODIUM CHLORIDE 0.9 % IV BOLUS (SEPSIS): 1000.0000 mL | Freq: Once | INTRAVENOUS | Status: AC

## 2016-05-15 NOTE — Progress Notes (Signed)
New Admission Note:   Arrival Method: Via stretcher from the ED Mental Orientation:  A & O x 4 - drowsy, falls asleep easily.  Refusing to answer admission questions at this time Telemetry: N/A Assessment: Completed Skin:  Dry.  Abrasion from fall on right knee IV:  Rt forearm Pain: Complains of right flank pain 8/10 Tubes:  None Safety Measures: Safety Fall Prevention Plan has been given, discussed and signed Admission: Completed [facility] [place] Orientation: Patient has been orientated to the room, unit and staff.  Family:  None at bedside.  Patient does not want to answer admission history questions at this time.  Per MD note, there are home issues that need to be addressed when patient more alert.  A Social Work Consult has been ordered.  She has her purse, wig, and clothes at bedside.  She does not want to send to safe.  Orders have been reviewed and implemented. Will continue to monitor the patient. Call light has been placed within reach and bed alarm has been activated.   Earleen Reaper RN- [place], [place] Phone number: [phone]

## 2016-05-15 NOTE — Progress Notes (Signed)
Patient has received 2 liter NS boluses on this shift. Orders for an additional 500cc and 250cc boluses per sepsis bundle. Vitals improved after 2nd liter. Verified with Dr. Avon Gully to hold remaining 2 boluses and continue to monitor.   Joellen Jersey, RN.

## 2016-05-15 NOTE — Progress Notes (Signed)
Report called to [place]. Ultrasound called this RN to obtain abdominal US first. Notified RN on 2H and she stated she was okay with patient getting ultrasound completed prior to transfer. Unit CN to go with patient to ultrasound.   Joellen Jersey, RN.

## 2016-05-15 NOTE — Progress Notes (Signed)
Family Medicine Teaching Service Daily Progress Note Intern Pager: [phone]  Patient name: [name] Medical record number: [id number] Date of birth: 11-Apr-1996 Age: 20 y.o. Gender: female  Primary Care Provider: Dimas Chyle, MD Consultants: none Code Status: FULL  Pt Overview and Major Events to Date:  7/16 admitted for pyelonephritis  Assessment and Plan: MARSHAE Bradley is a 20 y.o. female presenting with pyelonephritis. PMH is significant for sickle cell disease SS, tobacco abuse, intermittent asthma  Fever / Flank pain 2/2 pyelonephritis. Meets 2/4 SIRS on admission. qSOFA 0. UA consistent large leukocytes and few bacteria. WBC 44.9 and temperature of 100.28F on admission, WBC decreased to 38.7 No history of nephrolithiasis. Paged to bedside at 10am this morning that patient was having chills and hypotensive to 86/60  - Vanc/zosyn given in ED, now on CTX (7/16- ) - given 1L bolus this morning, recheck vitals afterwards. Keep on mIVF afterwards. Consider moving to step down if not responding to fluids.  - Blood and Urine culture pending - Continue home norco for pain with morphine for breakthrough pain - Zofran prn nausea - Abd [place] to r/o hydronephrosis vs kidney abscess. If does not improve clinically on rocephin consider CT abdomen.  Elevated beta hcg. Beta hcg elevated to 24.9 on admission. Unclear if patient actually pregnant or if this is a false positive result. LMP on 04/23/2016. There have been reports of hcg being falsely elevated in cases of e coli bacteremia - may the source of her result - Urine quantitative hcg 2 - Recheck plasma quantitative hcg tomorrow for doubling   AKI. Cr 1.3 < 1.47 on admission. Baseline <1. Likely secondary to pyelonephritis and dehydration. Patient at higher risk for renal insult given history of sickle cell disease. - Trend BMP  - Avoid NSAIDs - IVF  Weight Loss. Patient noted to have weight loss of 33lb over the past 6  months. Per patient, the place she is living in now "does not treat me very well." Patient reports that she had not eaten in several days prior to admission.  - HIV pending - CSW consult - check UDS with metabolites considering patient has home opiates   Sickle Cell Anemia. Hgb 8.6 on admission, now decreased 7.2 likely d/t dilution. Baseline 7-9. Good retic response. Does not appear to be in an active pain crisis. No signs of acute chest syndrome on CXR.  - Trend CBC - Continue home vicodin - Is not currently on hydroxyurea - Needs follow up with hematology after discharge  Intermittent Asthma. Stable - Albuterol as needed  FEN/GI: Regular diet, 1/2 NS @100cc /hr Prophylaxis: Lovenox  Disposition: Admitted pending above management. Anticipate discharge home once stable on PO antibiotics.   Subjective:  Patient is sleepy this morning but states feels no improvement from yesterday, "I feel bad." Still having chills, R flank pain. Has not had much of appetite.   Objective: Temp:  [98.9 F (37.2 C)-102.3 F (39.1 C)] 99.9 F (37.7 C) (07/17 1003) Pulse Rate:  [100-132] 113 (07/17 1003) Resp:  [19-24] 24 (07/17 1003) BP: (86-111)/(39-64) 100/39 mmHg (07/17 1140) SpO2:  [95 %-100 %] 99 % (07/17 1003) Weight:  [108 lb 3.2 oz (49.079 kg)-112 lb 12.8 oz (51.166 kg)] 112 lb 12.8 oz (51.166 kg) (07/16 2115)   Physical Exam: General: thin, ill appearing 20 year old female sleeping in hospital bed, groggy but arousable. Teeth chattering Cardiovascular: RRR, no murmurs noted  Respiratory: CTAB, normal effort Abdomen: +bs, soft, tender to deep palpation on  R side, no TTP on L, R CVA tenderness. Extremities: No cyanosis or edema  Laboratory:  Recent Labs Lab 05/14/16 1830 05/15/16 0338  WBC 44.9* 38.7*  HGB 8.6* 7.2*  HCT 23.7* 20.0*  PLT 409* 389    Recent Labs Lab 05/14/16 1830 05/15/16 0338  NA 131* 131*  K 3.9 3.7  CL 102 105  CO2 21* 20*  BUN 21* 23*  CREATININE  1.30* 1.47*  CALCIUM 9.0 8.0*  PROT 7.5  --   BILITOT 13.5*  --   ALKPHOS 77  --   ALT 48  --   AST 35  --   GLUCOSE 109* 108*    Beta HCG quant 2  Imaging/Diagnostic Tests: Dg Chest 2 View  05/14/2016  CLINICAL DATA:  20 year old female with shortness of breath for 2 days. History of sickle cell disease. EXAM: CHEST  2 VIEW COMPARISON:  11/04/2015 and prior exams FINDINGS: Mild cardiomegaly again noted. Mild left basilar opacity identified -question atelectasis versus early airspace disease. There is no evidence of consolidation, mass, pleural effusion or pneumothorax. No acute bony abnormalities are present. IMPRESSION: Mild left basilar opacity -question atelectasis versus early airspace disease. Mild cardiomegaly. Electronically Signed   By: Margarette Canada M.D.   On: 05/14/2016 19:51    Bufford Lope, DO 05/15/2016, 11:56 AM PGY-1, [place] Intern pager: [phone], text pages welcome

## 2016-05-15 NOTE — Care Management Important Message (Signed)
Important Message  Patient Details  Name: [name] MRN: [id number] Date of Birth: Oct 11, 1996   Medicare Important Message Given:  Yes    Loann Quill 05/15/2016, 3:05 PM

## 2016-05-15 NOTE — Progress Notes (Signed)
Md made aware of Temp 102.8.  Will continue to monitor [name] T

## 2016-05-15 NOTE — Progress Notes (Signed)
During 10 am med pass, patient c/o abdominal pain and chills. Her teeth are chattering. Vitals obtained. Prn pain medication administered per MAR. Patient was hypotensive, which was verified manually, tachypneic, and tachycardiac. Patient had received a 1L NS bolus prior to shift change this morning. Notified Family Medicine who stated they would come assess the patient. Dr. McDiarmid arrived to room about 15 minutes later. Orders placed for another 1L bolus and cardiac monitoring. Telemetry box #2 applied to patient. Will recheck vitals after bolus completed.   Joellen Jersey, RN.

## 2016-05-15 NOTE — Progress Notes (Signed)
CRITICAL VALUE ALERT  Critical value received:  Hgb 5.8  Date of notification:  05/15/16  Time of notification:  [id number]  Critical value read back:Yes.    Nurse who received alert:  Dwaine Gale, RN  MD notified (1st page):  Family Medicine  Time of first page:  1555  MD notified (2nd page):  Time of second page:  Responding MD:  Dr. Andy Gauss   Time MD responded:  1556

## 2016-05-15 NOTE — Progress Notes (Signed)
   05/15/16 1347  Clinical Encounter Type  Visited With Patient  Visit Type Initial  Referral From Nurse  Consult/Referral To Chaplain  Spiritual Encounters  Spiritual Needs Prayer  Stress Factors  Patient Stress Factors Financial concerns;Health changes;Loss of control;Major life changes  Chaplain visit made per nurse request, patient very sleepy but desired prayer, provided spiritual presence and prayer given. Advised that further support services are available 24/7 upon request.

## 2016-05-15 NOTE — Progress Notes (Signed)
PHARMACY - PHYSICIAN COMMUNICATION CRITICAL VALUE ALERT - BLOOD CULTURE IDENTIFICATION (BCID)  Results for orders placed or performed during the hospital encounter of 05/14/16  Blood Culture ID Panel (Reflexed) (Collected: 05/14/2016  8:25 PM)  Result Value Ref Range   Enterococcus species NOT DETECTED NOT DETECTED   Vancomycin resistance NOT DETECTED NOT DETECTED   Listeria monocytogenes NOT DETECTED NOT DETECTED   Staphylococcus species NOT DETECTED NOT DETECTED   Staphylococcus aureus NOT DETECTED NOT DETECTED   Methicillin resistance NOT DETECTED NOT DETECTED   Streptococcus species NOT DETECTED NOT DETECTED   Streptococcus agalactiae NOT DETECTED NOT DETECTED   Streptococcus pneumoniae NOT DETECTED NOT DETECTED   Streptococcus pyogenes NOT DETECTED NOT DETECTED   Acinetobacter baumannii NOT DETECTED NOT DETECTED   Enterobacteriaceae species DETECTED (A) NOT DETECTED   Enterobacter cloacae complex NOT DETECTED NOT DETECTED   Escherichia coli DETECTED (A) NOT DETECTED   Klebsiella oxytoca NOT DETECTED NOT DETECTED   Klebsiella pneumoniae NOT DETECTED NOT DETECTED   Proteus species NOT DETECTED NOT DETECTED   Serratia marcescens NOT DETECTED NOT DETECTED   Carbapenem resistance NOT DETECTED NOT DETECTED   Haemophilus influenzae NOT DETECTED NOT DETECTED   Neisseria meningitidis NOT DETECTED NOT DETECTED   Pseudomonas aeruginosa NOT DETECTED NOT DETECTED   Candida albicans NOT DETECTED NOT DETECTED   Candida glabrata NOT DETECTED NOT DETECTED   Candida krusei NOT DETECTED NOT DETECTED   Candida parapsilosis NOT DETECTED NOT DETECTED   Candida tropicalis NOT DETECTED NOT DETECTED   20 year old female admitted with pyelonephritis. She now has Gram negative bacteremia and E coli was identified on BCID.  She is currently on Ceftriaxone 2g IV q24h which is the recommended empiric coverage for bacteremia with E coli.  Name of physician (or Provider) Contacted: Text page sent to [facility]  pager [phone])  Changes to prescribed antibiotics required: none required. Continue ceftriaxone pending sensitivities.  Norva Riffle 05/15/2016  12:47 PM

## 2016-05-15 NOTE — Progress Notes (Signed)
Initial Nutrition Assessment  DOCUMENTATION CODES:   Non-severe (moderate) malnutrition in context of social or environmental circumstances  INTERVENTION:  Continue Ensure Enlive po BID, each supplement provides 350 kcal and 20 grams of protein.  Encourage adequate PO intake.   NUTRITION DIAGNOSIS:   Malnutrition related to social / environmental circumstances as evidenced by percent weight loss, moderate depletions of muscle mass.  GOAL:   Patient will meet greater than or equal to 90% of their needs  MONITOR:   PO intake, Supplement acceptance, Weight trends, Labs, I & O's  REASON FOR ASSESSMENT:   Malnutrition Screening Tool    ASSESSMENT:   20 y.o. female presenting with pyelonephritis. PMH is significant for sickle cell disease SS, tobacco abuse, intermittent asthma  Pt was fatigued during time of visit and has difficulties staying awake when having a conversation with RD. Meal completion has been 85%. Pt reports appetite is fine currently and PTA with usual consumption of at least 3 meals a day. Pt however reports no po intake for a couple of days PTA. Per Epic weight records, pt with a 22% weight loss in 6 months. Pt reports weight loss has been associated with stress. Pt currently has Ensure ordered and reports she would like to continue with them. Pt encouraged to eat her food at meals and to drink her supplements.   Nutrition-Focused physical exam completed. Findings are no fat depletion, moderate muscle depletion, and no edema.   Labs and medications reviewed.   Diet Order:  Diet regular Room service appropriate?: Yes; Fluid consistency:: Thin  Skin:  Reviewed, no issues  Last BM:  7/10  Height:   Ht Readings from Last 1 Encounters:  05/14/16 5\' 4"  (1.626 m)    Weight:   Wt Readings from Last 1 Encounters:  05/14/16 112 lb 12.8 oz (51.166 kg)    Ideal Body Weight:  54.5 kg  BMI:  Body mass index is 19.35 kg/(m^2).  Estimated Nutritional Needs:    Kcal:  1700-1900  Protein:  65-75 grams  Fluid:  1.7 - 1.9 L/day  EDUCATION NEEDS:   No education needs identified at this time  Corrin Parker, MS, RD, LDN Pager # [phone] After hours/ weekend pager # [phone]

## 2016-05-15 NOTE — Progress Notes (Signed)
Patient continues to remain very drowsy.  Easy to awaken but falls asleep quickly.  Continues to rate pain to right flank as 8/10.  No morphine given since 2238 due to mentation.  Her VS at 0516 are:  102.3 temp, 100/46 b/p, 125 bpm for hr, 20 respirations, and 96% on room air.  MD made aware.  Will given 650 mg Tylenol PO and 1 liter NS bolus.  Will continue to monitor patient.  Earleen Reaper RN-BC, [employer]

## 2016-05-16 ENCOUNTER — Inpatient Hospital Stay (HOSPITAL_COMMUNITY): Payer: Medicare Other

## 2016-05-16 DIAGNOSIS — D638 Anemia in other chronic diseases classified elsewhere: Secondary | ICD-10-CM | POA: Insufficient documentation

## 2016-05-16 DIAGNOSIS — D5700 Hb-SS disease with crisis, unspecified: Secondary | ICD-10-CM

## 2016-05-16 DIAGNOSIS — E440 Moderate protein-calorie malnutrition: Secondary | ICD-10-CM

## 2016-05-16 DIAGNOSIS — D6489 Other specified anemias: Secondary | ICD-10-CM

## 2016-05-16 DIAGNOSIS — D72829 Elevated white blood cell count, unspecified: Secondary | ICD-10-CM

## 2016-05-16 LAB — CBC
HCT: 17 % — ABNORMAL LOW (ref 36.0–46.0)
HCT: 17.9 % — ABNORMAL LOW (ref 36.0–46.0)
HEMATOCRIT: 19.3 % — AB (ref 36.0–46.0)
HEMOGLOBIN: 6.6 g/dL — AB (ref 12.0–15.0)
Hemoglobin: 6.2 g/dL — CL (ref 12.0–15.0)
Hemoglobin: 7.1 g/dL — ABNORMAL LOW (ref 12.0–15.0)
MCH: 32.8 pg (ref 26.0–34.0)
MCH: 32.1 pg (ref 26.0–34.0)
MCH: 31.9 pg (ref 26.0–34.0)
MCHC: 36.5 g/dL — AB (ref 30.0–36.0)
MCHC: 36.8 g/dL — ABNORMAL HIGH (ref 30.0–36.0)
MCHC: 36.9 g/dL — AB (ref 30.0–36.0)
MCV: 89.9 fL (ref 78.0–100.0)
MCV: 87.3 fL (ref 78.0–100.0)
MCV: 86.5 fL (ref 78.0–100.0)
PLATELETS: 311 10*3/uL (ref 150–400)
PLATELETS: 331 10*3/uL (ref 150–400)
PLATELETS: 341 10*3/uL (ref 150–400)
RBC: 1.89 MIL/uL — ABNORMAL LOW (ref 3.87–5.11)
RBC: 2.21 MIL/uL — AB (ref 3.87–5.11)
RBC: 2.07 MIL/uL — ABNORMAL LOW (ref 3.87–5.11)
RDW: 16.1 % — ABNORMAL HIGH (ref 11.5–15.5)
RDW: 16.6 % — ABNORMAL HIGH (ref 11.5–15.5)
RDW: 17.4 % — ABNORMAL HIGH (ref 11.5–15.5)
WBC: 24.8 10*3/uL — ABNORMAL HIGH (ref 4.0–10.5)
WBC: 21.1 10*3/uL — AB (ref 4.0–10.5)
WBC: 17.5 10*3/uL — ABNORMAL HIGH (ref 4.0–10.5)

## 2016-05-16 LAB — BASIC METABOLIC PANEL
Anion gap: 7 (ref 5–15)
BUN: 14 mg/dL (ref 6–20)
CO2: 17 mmol/L — ABNORMAL LOW (ref 22–32)
Calcium: 7.9 mg/dL — ABNORMAL LOW (ref 8.9–10.3)
Chloride: 109 mmol/L (ref 101–111)
Creatinine, Ser: 1.39 mg/dL — ABNORMAL HIGH (ref 0.44–1.00)
GFR calc non Af Amer: 54 mL/min — ABNORMAL LOW (ref >60)
Glucose, Bld: 91 mg/dL (ref 65–99)
Potassium: 3.8 mmol/L (ref 3.5–5.1)
SODIUM: 133 mmol/L — AB (ref 135–145)

## 2016-05-16 LAB — HCG, SERUM, QUALITATIVE: Preg, Serum: NEGATIVE

## 2016-05-16 LAB — RETICULOCYTES
RBC.: 2.2 MIL/uL — AB (ref 3.87–5.11)
RETIC COUNT ABSOLUTE: 96.8 10*3/uL (ref 19.0–186.0)
Retic Ct Pct: 4.4 % — ABNORMAL HIGH (ref 0.4–3.1)

## 2016-05-16 LAB — PREPARE RBC (CROSSMATCH)

## 2016-05-16 LAB — URINE CULTURE

## 2016-05-16 LAB — HCG, QUANTITATIVE, PREGNANCY: hCG, Beta Chain, Quant, S: 4 m[IU]/mL (ref <5)

## 2016-05-16 LAB — HIV ANTIBODY (ROUTINE TESTING W REFLEX): HIV SCREEN 4TH GENERATION: NONREACTIVE

## 2016-05-16 LAB — LACTIC ACID, PLASMA: Lactic Acid, Venous: 0.5 mmol/L (ref 0.5–1.9)

## 2016-05-16 MED ORDER — SODIUM CHLORIDE 0.9 % IV SOLN
Freq: Once | INTRAVENOUS | Status: AC
  Administered 2016-05-16: 17:00:00 via INTRAVENOUS

## 2016-05-16 MED ORDER — AZITHROMYCIN 500 MG PO TABS
500.0000 mg | ORAL_TABLET | Freq: Every day | ORAL | Status: AC
Start: 2016-05-16 — End: 2016-05-16
  Administered 2016-05-16: 500 mg via ORAL
  Filled 2016-05-16: qty 1

## 2016-05-16 MED ORDER — SODIUM CHLORIDE 0.9 % IV SOLN
INTRAVENOUS | Status: DC
  Administered 2016-05-16: 100 mL/h via INTRAVENOUS
  Administered 2016-05-17 – 2016-05-18 (×3): via INTRAVENOUS

## 2016-05-16 MED ORDER — SODIUM CHLORIDE 0.9 % IV SOLN
Freq: Once | INTRAVENOUS | Status: AC
  Administered 2016-05-16: 10 mL via INTRAVENOUS

## 2016-05-16 MED ORDER — SODIUM CHLORIDE 0.9 % IV SOLN
500.0000 mg | Freq: Three times a day (TID) | INTRAVENOUS | Status: DC
  Administered 2016-05-16 – 2016-05-18 (×6): 500 mg via INTRAVENOUS
  Filled 2016-05-16 (×9): qty 500

## 2016-05-16 MED ORDER — HYDROMORPHONE HCL 1 MG/ML IJ SOLN
1.0000 mg | INTRAMUSCULAR | Status: DC | PRN
  Administered 2016-05-16 – 2016-05-17 (×5): 1 mg via INTRAVENOUS
  Filled 2016-05-16 (×5): qty 1

## 2016-05-16 MED ORDER — PNEUMOCOCCAL VAC POLYVALENT 25 MCG/0.5ML IJ INJ
0.5000 mL | INJECTION | INTRAMUSCULAR | Status: AC
  Administered 2016-05-18: 0.5 mL via INTRAMUSCULAR
  Filled 2016-05-16 (×2): qty 0.5

## 2016-05-16 MED ORDER — AZITHROMYCIN 500 MG PO TABS
250.0000 mg | ORAL_TABLET | Freq: Every day | ORAL | Status: DC
  Administered 2016-05-17 – 2016-05-18 (×2): 250 mg via ORAL
  Filled 2016-05-16 (×2): qty 1

## 2016-05-16 NOTE — Progress Notes (Addendum)
CRITICAL VALUE ALERT  Critical value received:  hgb 6.6  Date of notification:  05-16-16  Time of notification:  [id number]  Critical value read back:yes  Nurse who received alert: Coletta Memos RN  MD notified (1st page): Teaching service   Time of first page:  1634  MD notified (2nd page):  Time of second page:  Responding MD: Andy Gauss   Time MD responded:  [phone]

## 2016-05-16 NOTE — Progress Notes (Signed)
Family Medicine Teaching Service Daily Progress Note Intern Pager: [phone]  Patient name: [name] Medical record number: [id number] Date of birth: [birth year] Age: 20 y.o. Gender: female  Primary Care Provider: Dimas Chyle, MD Consultants: none Code Status: FULL  Pt Overview and Major Events to Date:  7/16 admitted for pyelonephritis  Assessment and Plan: [name] is a 20 y.o. female presenting with pyelonephritis. PMH is significant for sickle cell disease SS, tobacco abuse, intermittent asthma  Fever/Flank pain 2/2 pyelonephritis. Meets 2/4 SIRS on admission. qSOFA 0. UA consistent large leukocytes and few bacteria. WBC 44.9 and temperature of 100.79F on admission, patient was hypotensive yesterday after two NS bolus. Patient hemoglobin was 5.8, she received two units of blood. Hemoglobin this morning is 7.1. Patient blood culture have shown e coli. P. Patient had a fever of 100.50F overnight. Will widen coverage while sensitivities are pending. Renal US showed echogenic kidneys that are prominent in size, right greater than left. In a patient with sickle cell disease, papillary necrosis is considered. No obstructive uropathy or evident renal stone. Nephrology was consulted.  --Started on Imipenem-cilastin 500 mg IV on (start 7/18-) --Started on ceftriaxone CTX 2g IV on 7/16, discontinue on 7/18 for broader coverage --Continue home norco for pain with morphine for breakthrough pain --Zofran prn nausea  Elevated beta hcg. Beta hcg elevated to 24.9 on admission. Unclear if patient actually pregnant or if this is a false positive result. LMP on 04/23/2016. There have been reports of hcg being falsely elevated in cases of e coli bacteremia - may the source of her result Urine quantitative hcg is 4 this morning effectively doubled from yesterday. But no concerns for pregnancy at the moment  AKI. Cr 1.39 this morning down from 1.57 yesterday. Baseline 1.0 . Likely  secondary to pyelonephritis and dehydration. Patient at higher risk for renal insult given history of sickle cell disease. --Continue to trend BMP  --Avoid NSAIDs --Continue NS @ 100cc/hr  Weight Loss. Patient noted to have weight loss of 33lb over the past 6 months. Per patient, the place she is living in now "does not treat me very well." Patient reports that she had not eaten in several days prior to admission.     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 Patient Hemoglobin was 5.8 yesterday with a Baseline close to 7-9. Good retic response. Does not appear to be in an active pain crisis. Patient had crackles on lung exam. Will follow with CXR.  - Trend CBC - Continue home vicodin - Is not currently on hydroxyurea - Needs follow up with hematology after discharge --Trend Retic count  Intermittent Asthma. Stable - Albuterol as needed  FEN/GI: Regular diet, NS @100cc /hr Prophylaxis: Lovenox  Disposition: Admitted pending above management. Anticipate discharge home once stable on PO antibiotics.   Subjective:  Patient complaining of headaches, flank and lower extremities pain. She barely open her eyes. Patient ask for dilaudid for pain control and admitted it is the only medication that works for her. Patient denies chills but endorsed some fever overnight. No SOB.  Objective: Temp:  [99.1 F (37.3 C)-102.9 F (39.4 C)] 99.1 F (37.3 C) (07/18 0745) Pulse Rate:  [93-120] 108 (07/18 0745) Resp:  [17-46] 31 (07/18 0745) BP: (73-113)/(34-80) 106/61 mmHg (07/18 0745) SpO2:  [90 %-100 %] 94 % (07/18 0745) Weight:  [120 lb 5.9 oz (54.6 kg)-121 lb 9.6 oz (55.157 kg)]  121 lb 9.6 oz (55.157 kg) (07/18 0300)   Physical Exam: General: thin, ill appearing 20 year old female sleeping in hospital bed, groggy but arousable. Teeth chattering Cardiovascular: RRR, no murmurs noted  Respiratory: CTAB, normal  effort Abdomen: +bs, soft, tender to deep palpation on R side, no TTP on L, R CVA tenderness. Extremities: No cyanosis or edema  Laboratory:  Recent Labs Lab 05/15/16 2305 05/16/16 0154 05/16/16 0721  WBC 26.1* 24.8* 21.1*  HGB 6.4* 6.2* 7.1*  HCT 18.1* 17.0* 19.3*  PLT 336 311 331    Recent Labs Lab 05/14/16 1830 05/15/16 0338 05/15/16 1520 05/16/16 0154  NA 131* 131* 134* 133*  K 3.9 3.7 3.8 3.8  CL 102 105 110 109  CO2 21* 20* 20* 17*  BUN 21* 23* 20 14  CREATININE 1.30* 1.47* 1.57* 1.39*  CALCIUM 9.0 8.0* 7.7* 7.9*  PROT 7.5  --  5.8*  --   BILITOT 13.5*  --  13.4*  --   ALKPHOS 77  --  56  --   ALT 48  --  36  --   AST 35  --  24  --   GLUCOSE 109* 108* 106* 91    Beta HCG quant 4  Imaging/Diagnostic Tests: Dg Chest 2 View  05/14/2016  CLINICAL DATA:  20 year old female with shortness of breath for 2 days. History of sickle cell disease. EXAM: CHEST  2 VIEW COMPARISON:  11/04/2015 and prior exams FINDINGS: Mild cardiomegaly again noted. Mild left basilar opacity identified -question atelectasis versus early airspace disease. There is no evidence of consolidation, mass, pleural effusion or pneumothorax. No acute bony abnormalities are present. IMPRESSION: Mild left basilar opacity -question atelectasis versus early airspace disease. Mild cardiomegaly. Electronically Signed   By: Margarette Canada M.D.   On: 05/14/2016 19:51   US Abdomen Complete  05/15/2016  CLINICAL DATA:  Right flank and right upper quadrant pain, onset 2 days prior. EXAM: ABDOMEN ULTRASOUND COMPLETE COMPARISON:  Abdominal ultrasound 06/12/2014.  CT 06/17/2014 FINDINGS: Gallbladder: Surgically absent. No abnormality in the gallbladder fossa. Common bile duct: Diameter: 4 mm. Liver: Appears prominent size, however direct measurement is difficult due to orientation. No focal lesion identified. Within normal limits in parenchymal echogenicity. Normal directional flow in the imaged main portal vein. IVC: No  abnormality visualized. Pancreas: Visualized portion unremarkable. Spleen: Surgically absent. Right Kidney: Length: 15.2 cm. There is increased renal echogenicity. No mass or hydronephrosis visualized. No shadowing stone. Left Kidney: Length: 13.9 cm. There is increased renal echogenicity. No mass or hydronephrosis visualized. No shadowing stone. Abdominal aorta: No aneurysm visualized. Other findings: None.  No ascites. IMPRESSION: 1. Echogenic kidneys that are prominent in size, right greater than left. In a patient with sickle cell disease, papillary necrosis is considered. No obstructive uropathy or evident renal stone. 2. Suspected hepatomegaly. 3. Postcholecystectomy and splenectomy. Electronically Signed   By: Jeb Levering M.D.   On: 05/15/2016 18:52   Dg Chest Port 1 View  05/15/2016  CLINICAL DATA:  Sepsis.  Fever. EXAM: PORTABLE CHEST 1 VIEW COMPARISON:  PA and lateral chest 05/14/2016 and 11/04/2015. FINDINGS: Left lower lobe airspace disease is seen as on the most recent examination. The right lung is clear. Heart size is normal. No pneumothorax. There may be a small left pleural effusion. IMPRESSION: Persistent left lower lobe airspace disease which could be due to atelectasis or pneumonia. Electronically Signed   By: Inge Rise M.D.   On: 05/15/2016 15:18    Maclovia Uher,  MD 05/16/2016, 9:30 AM PGY-1, [place] Intern pager: [phone], text pages welcome

## 2016-05-16 NOTE — Progress Notes (Signed)
Notified md of Hgb 6.4 temp 102.3.  Will give give 1 unit PRBC.  Will continue to monitor [name] T

## 2016-05-16 NOTE — Progress Notes (Addendum)
CALL PAGER [phone] for any questions or notifications regarding this patient  FMTS Attending Note: Cynthia Mcmurray MD Patient had fever again last night at 2300. Given US findings of possible papillary necrosis and ill appearing patient, I will consult renal to see if they think we need to do anything different. We will need to  broaden antibiotic coverage as well,  Given e. Coli in blood (prelim blood cx, no sensitivities).Possibly need further imaging but in light of AKI (creatinine 1.5) this may be difficult and not worth renal insult.

## 2016-05-17 DIAGNOSIS — A4151 Sepsis due to Escherichia coli [E. coli]: Principal | ICD-10-CM

## 2016-05-17 LAB — BASIC METABOLIC PANEL
Anion gap: 6 (ref 5–15)
BUN: 8 mg/dL (ref 6–20)
CALCIUM: 8.6 mg/dL — AB (ref 8.9–10.3)
CO2: 22 mmol/L (ref 22–32)
CREATININE: 0.99 mg/dL (ref 0.44–1.00)
Chloride: 106 mmol/L (ref 101–111)
Glucose, Bld: 101 mg/dL — ABNORMAL HIGH (ref 65–99)
Potassium: 3.7 mmol/L (ref 3.5–5.1)
SODIUM: 134 mmol/L — AB (ref 135–145)

## 2016-05-17 LAB — CULTURE, BLOOD (ROUTINE X 2)

## 2016-05-17 LAB — TYPE AND SCREEN
ABO/RH(D): B POS
ANTIBODY SCREEN: NEGATIVE
UNIT DIVISION: 0
UNIT DIVISION: 0
Unit division: 0

## 2016-05-17 LAB — RETICULOCYTES
RBC.: 2.53 MIL/uL — AB (ref 3.87–5.11)
RETIC COUNT ABSOLUTE: 131.6 10*3/uL (ref 19.0–186.0)
RETIC CT PCT: 5.2 % — AB (ref 0.4–3.1)

## 2016-05-17 LAB — CBC
HEMATOCRIT: 21.7 % — AB (ref 36.0–46.0)
HEMOGLOBIN: 7.9 g/dL — AB (ref 12.0–15.0)
MCH: 31.2 pg (ref 26.0–34.0)
MCHC: 36.4 g/dL — ABNORMAL HIGH (ref 30.0–36.0)
MCV: 85.8 fL (ref 78.0–100.0)
Platelets: 349 10*3/uL (ref 150–400)
RBC: 2.53 MIL/uL — AB (ref 3.87–5.11)
RDW: 16.9 % — ABNORMAL HIGH (ref 11.5–15.5)
WBC: 15.5 10*3/uL — ABNORMAL HIGH (ref 4.0–10.5)

## 2016-05-17 MED ORDER — HYDROCODONE-ACETAMINOPHEN 5-325 MG PO TABS
1.0000 | ORAL_TABLET | ORAL | Status: DC | PRN
  Administered 2016-05-17 – 2016-05-18 (×4): 2 via ORAL
  Filled 2016-05-17 (×4): qty 2

## 2016-05-17 MED ORDER — HYDROMORPHONE HCL 1 MG/ML IJ SOLN
1.0000 mg | Freq: Four times a day (QID) | INTRAMUSCULAR | Status: DC | PRN
  Administered 2016-05-17 – 2016-05-18 (×3): 1 mg via INTRAVENOUS
  Filled 2016-05-17 (×3): qty 1

## 2016-05-17 NOTE — Care Management Important Message (Signed)
Important Message  Patient Details  Name: [name] MRN: [id number] Date of Birth: 06/15/1996   Medicare Important Message Given:  Yes    Loann Quill 05/17/2016, 10:02 AM

## 2016-05-17 NOTE — Progress Notes (Signed)
Family Medicine Teaching Service Daily Progress Note Intern Pager: [phone]  Patient name: [name] Medical record number: [id number] Date of birth: 1996-01-04 Age: 20 y.o. Gender: female  Primary Care Provider: Dimas Chyle, MD Consultants: none Code Status: FULL  Pt Overview and Major Events to Date:  7/16 admitted for pyelonephritis  Assessment and Plan: [name] is a 20 y.o. female presenting with pyelonephritis. PMH is significant for sickle cell disease SS, tobacco abuse, intermittent asthma  Fever/Flank pain 2/2 pyelonephritis. Meets 2/4 SIRS on admission. qSOFA 0. UA consistent large leukocytes and few bacteria. WBC 44.9 and temperature of 100.12F on admission, patient was hypotensive yesterday after two NS bolus. Hgb 7.9 today, she received two units of blood yesterday. Blood and urine culture shows e coli that is pansensitive. Tmax 99.3 F overnight. Renal US showed echogenic kidneys that are prominent in size, right greater than left. In a patient with sickle cell disease, papillary necrosis is considered. No obstructive uropathy or evident renal stone. Nephrology was consulted, did not feel to be acute process - Transfer out of SDU today - Imipenem-cilastin 500 mg IV on (7/18-), ceftriaxone CTX 2g IV on 7/16 -7/18, azithromycin (7/18-). Deescalate ABX today. -Continue home norco for pain with morphine for breakthrough pain -Zofran prn nausea - DC IVF b/c patient is tolerating po well  Elevated beta hcg. Beta hcg elevated to 24.9 on admission. Unclear if patient actually pregnant or if this is a false positive result. LMP on 04/23/2016. There have been reports of hcg being falsely elevated in cases of e coli bacteremia - may the source of her result Urine quantitative hcg is 4 this morning effectively doubled from yesterday. But no concerns for pregnancy at the moment  AKI, resolved. Cr 0.99 Baseline 1.0 . Likely secondary to pyelonephritis and dehydration.  Patient at higher risk for renal insult given history of sickle cell disease. - monitor --Avoid NSAIDs  Weight Loss. Patient noted to have weight loss of 33lb over the past 6 months. Per patient, the place she is living in now "does not treat me very well." Patient reports that she had not eaten in several days prior to admission.    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 Patient Hemoglobin 7.9 at baseline close to 7-9. Good retic response. Does not appear to be in an active pain crisis. Patient had crackles on lung exam. CXR showed L lung base consolidation concerning for pneumonia. - Trend CBC - Continue home vicodin - Is not currently on hydroxyurea - Needs follow up with hematology after discharge --Trend Retic count  Intermittent Asthma. Stable - Albuterol as needed  FEN/GI: Regular diet  Prophylaxis: Lovenox  Disposition: Admitted pending above management. Anticipate discharge home once stable on PO antibiotics.   Subjective:  Patient complaining of headaches, flank and lower extremities pain and still feels weak. States is eating and drinking well. Patient denies chills this morning. No SOB, CP  Objective: Temp:  [97.4 F (36.3 C)-102.8 F (39.3 C)] 97.4 F (36.3 C) (07/19 0748) Pulse Rate:  [72-117] 75 (07/19 0748) Resp:  [16-42] 24 (07/19 0748) BP: (73-122)/(56-75) 94/57 mmHg (07/19 0748) SpO2:  [90 %-100 %] 94 % (07/19 0748)   Physical Exam:  General: thin, ill appearing 20 year old female sleeping in hospital bed, sleepy but arousable. Cardiovascular: RRR, no murmurs noted  Respiratory: CTAB, normal effort Abdomen: +bs, soft, tender to deep palpation on  R side, no TTP on L, R CVA tenderness. Extremities: No cyanosis or edema  Laboratory:  Recent Labs Lab 05/16/16 0721 05/16/16 1547 05/17/16 0243  WBC 21.1* 17.5* 15.5*  HGB 7.1* 6.6* 7.9*  HCT 19.3* 17.9* 21.7*  PLT  331 341 349    Recent Labs Lab 05/14/16 1830  05/15/16 1520 05/16/16 0154 05/17/16 0243  NA 131*  < > 134* 133* 134*  K 3.9  < > 3.8 3.8 3.7  CL 102  < > 110 109 106  CO2 21*  < > 20* 17* 22  BUN 21*  < > 20 14 8   CREATININE 1.30*  < > 1.57* 1.39* 0.99  CALCIUM 9.0  < > 7.7* 7.9* 8.6*  PROT 7.5  --  5.8*  --   --   BILITOT 13.5*  --  13.4*  --   --   ALKPHOS 77  --  56  --   --   ALT 48  --  36  --   --   AST 35  --  24  --   --   GLUCOSE 109*  < > 106* 91 101*  < > = values in this interval not displayed.  Beta HCG quant 4  Imaging/Diagnostic Tests: Dg Chest 2 View  05/16/2016  CLINICAL DATA:  Shortness of breath and weakness. History of sickle cell disease. EXAM: CHEST  2 VIEW COMPARISON:  Chest radiograph May 15, 2016 FINDINGS: Cardiomediastinal silhouette is normal. Pulmonary vascular congestion. Patchy LEFT lung base airspace opacity. Strandy densities RIGHT lung base. Trachea projects midline and there is no pneumothorax. Soft tissue planes and included osseous structures are non-suspicious. Intravenous catheter projects RIGHT antecubital fossa. IMPRESSION: Persists LEFT lung base consolidation concerning for pneumonia. RIGHT lung base atelectasis.Mild pulmonary vascular congestion. Electronically Signed   By: Elon Alas M.D.   On: 05/16/2016 14:28   US Abdomen Complete  05/15/2016  CLINICAL DATA:  Right flank and right upper quadrant pain, onset 2 days prior. EXAM: ABDOMEN ULTRASOUND COMPLETE COMPARISON:  Abdominal ultrasound 06/12/2014.  CT 06/17/2014 FINDINGS: Gallbladder: Surgically absent. No abnormality in the gallbladder fossa. Common bile duct: Diameter: 4 mm. Liver: Appears prominent size, however direct measurement is difficult due to orientation. No focal lesion identified. Within normal limits in parenchymal echogenicity. Normal directional flow in the imaged main portal vein. IVC: No abnormality visualized. Pancreas: Visualized portion unremarkable. Spleen:  Surgically absent. Right Kidney: Length: 15.2 cm. There is increased renal echogenicity. No mass or hydronephrosis visualized. No shadowing stone. Left Kidney: Length: 13.9 cm. There is increased renal echogenicity. No mass or hydronephrosis visualized. No shadowing stone. Abdominal aorta: No aneurysm visualized. Other findings: None.  No ascites. IMPRESSION: 1. Echogenic kidneys that are prominent in size, right greater than left. In a patient with sickle cell disease, papillary necrosis is considered. No obstructive uropathy or evident renal stone. 2. Suspected hepatomegaly. 3. Postcholecystectomy and splenectomy. Electronically Signed   By: Jeb Levering M.D.   On: 05/15/2016 18:52   Dg Chest Port 1 View  05/15/2016  CLINICAL DATA:  Sepsis.  Fever. EXAM: PORTABLE CHEST 1 VIEW COMPARISON:  PA and lateral chest 05/14/2016 and 11/04/2015. FINDINGS: Left lower lobe airspace disease is seen as on the most recent examination. The right lung is clear. Heart size is normal. No pneumothorax. There may be a small left pleural effusion. IMPRESSION: Persistent left lower lobe airspace disease which could be due to atelectasis or pneumonia. Electronically Signed   By: Inge Rise M.D.  On: 05/15/2016 15:18    Bufford Lope, DO 05/17/2016, 7:59 AM PGY-1, [place] Intern pager: [phone], text pages welcome

## 2016-05-17 NOTE — Progress Notes (Signed)
NURSING PROGRESS NOTE  [name] [id number] Transfer Data: 05/17/2016 6:51 PM Attending Provider: Blane Ohara McDiarmid, MD [id number] Cynthia Pain, MD Code Status: Full  [name] is a 20 y.o. female patient transferred from [place] -No acute distress noted.  -No complaints of shortness of breath.  -No complaints of chest Bradley.   Cardiac Monitoring: Box # 01 in place. Cardiac monitor yields:normal sinus rhythm.  Last Documented Vital Signs: Blood pressure 101/57, pulse 70, temperature 98.8 F (37.1 C), temperature source Oral, resp. rate 19, height 5\' 4"  (1.626 m), weight 55.157 kg (121 lb 9.6 oz), last menstrual period 04/23/2016, SpO2 97 %.  IV Fluids:  IV in place, occlusive dsg intact without redness, IV cath forearm left, condition patent and no redness normal saline.   Allergies:  Review of patient's allergies indicates no known allergies.  Past Medical History:   has a past medical history of Asthma; Sickle cell disease ([place]); Amenorrhea; Hyperbilirubinemia (02/26/2015); Drug-induced pruritus (02/26/2015); GERD (gastroesophageal reflux disease); Headache; Anemia; and Acute kidney injury ([place]) (05/15/2016).  Past Surgical History:   has past surgical history that includes Splenectomy and Tonsillectomy.  Social History:   reports that she has been smoking Cigarettes.  She has been smoking about 0.50 packs per day. She has never used smokeless tobacco. She reports that she does not drink alcohol or use illicit drugs.  Skin: Skin intact. Healed abrasion to L knee.   Patient/Family orientated to room. Information packet given to patient/family. Admission inpatient armband information verified with patient/family to include name and date of birth and placed on patient arm. Side rails up x 2, fall assessment and education completed with patient/family. Patient/family able to verbalize understanding of risk associated with falls and verbalized understanding to call for assistance  before getting out of bed. Call light within reach. Patient/family able to voice and demonstrate understanding of unit orientation instructions.    Will continue to evaluate and treat per MD orders.

## 2016-05-18 DIAGNOSIS — A4151 Sepsis due to Escherichia coli [E. coli]: Secondary | ICD-10-CM | POA: Diagnosis not present

## 2016-05-18 LAB — BASIC METABOLIC PANEL
Anion gap: 7 (ref 5–15)
BUN: 10 mg/dL (ref 6–20)
CALCIUM: 8.4 mg/dL — AB (ref 8.9–10.3)
CO2: 22 mmol/L (ref 22–32)
CREATININE: 0.97 mg/dL (ref 0.44–1.00)
Chloride: 106 mmol/L (ref 101–111)
Glucose, Bld: 104 mg/dL — ABNORMAL HIGH (ref 65–99)
Potassium: 3.8 mmol/L (ref 3.5–5.1)
SODIUM: 135 mmol/L (ref 135–145)

## 2016-05-18 LAB — CBC
HCT: 20.1 % — ABNORMAL LOW (ref 36.0–46.0)
Hemoglobin: 7.3 g/dL — ABNORMAL LOW (ref 12.0–15.0)
MCH: 31.2 pg (ref 26.0–34.0)
MCHC: 36.3 g/dL — AB (ref 30.0–36.0)
MCV: 85.9 fL (ref 78.0–100.0)
PLATELETS: 356 10*3/uL (ref 150–400)
RBC: 2.34 MIL/uL — ABNORMAL LOW (ref 3.87–5.11)
RDW: 17.3 % — AB (ref 11.5–15.5)
WBC: 18.8 10*3/uL — AB (ref 4.0–10.5)

## 2016-05-18 LAB — RETICULOCYTES
RBC.: 2.34 MIL/uL — AB (ref 3.87–5.11)
RETIC CT PCT: 2.9 % (ref 0.4–3.1)
Retic Count, Absolute: 67.9 10*3/uL (ref 19.0–186.0)

## 2016-05-18 MED ORDER — AZITHROMYCIN 250 MG PO TABS: 250.0000 mg | ORAL_TABLET | Freq: Every day | ORAL | Status: AC

## 2016-05-18 MED ORDER — CEPHALEXIN 500 MG PO CAPS
500.0000 mg | ORAL_CAPSULE | Freq: Four times a day (QID) | ORAL | Status: DC
  Administered 2016-05-18: 500 mg via ORAL
  Filled 2016-05-18: qty 1

## 2016-05-18 MED ORDER — CEPHALEXIN 250 MG PO CAPS
250.0000 mg | ORAL_CAPSULE | Freq: Four times a day (QID) | ORAL | Status: DC
Start: 2016-05-18 — End: 2016-05-18
  Filled 2016-05-18: qty 1

## 2016-05-18 MED ORDER — CEPHALEXIN 500 MG PO CAPS: 500.0000 mg | ORAL_CAPSULE | Freq: Four times a day (QID) | ORAL | Status: DC

## 2016-05-18 NOTE — Discharge Summary (Signed)
[facility] Discharge Summary  Patient name: [name] Medical record number: [id number] Date of birth: 07-Aug-1996 Age: 20 y.o. Gender: female Date of Admission: 05/14/2016  Date of Discharge: 05/18/16 Admitting Physician: Blane Ohara McDiarmid, MD  Primary Care Provider: Dimas Chyle, MD Consultants: none  Indication for Hospitalization: pyelonephritits  Discharge Diagnoses/Problem List:  Pyelonephritis AKI Weight loss Sickle cell anemia Intermittent asthma  Disposition: Home  Discharge Condition: Stable, improved  Discharge Exam:  General: thin, appearing 20 year old female laying comfortably in bed Cardiovascular: RRR, no murmurs noted  Respiratory: CTAB, normal effort Abdomen: +bs, soft, nt, nd. No CVA tenderness Extremities: No cyanosis or edema  Brief Hospital Course:  [name] is a 20 y.o. female presenting with pyelonephritis. PMH is significant for sickle cell disease SS, tobacco abuse, intermittent asthma.  Pyelonephritis Patient presented R flank pain starting 2 days prior to presentation, found to be in severe sepsis. She initially received ceftriaxone but antibiotics were escalated to imipenem-cilastin when patient continued to be febrile and necessitating transfer to stepdown unit. She had renal ultrasound showing echogenic kidneys that are prominent in size, right greater than left, that nephrology did not feel to be an acute process. Blood and urine cultures showed pansenstive E. Coli. Patient clinically improved and antibiotics were de-escalated to cefazolin to have coverage for totally of 14 days, last dose 05/28/16.  Sickle Cell Anemia with possible Acute Chest Patient Hgb on admission was 8.6 with a good reticulocyte response and did not appear to be in an acute pain crisis. During hospital stay, Hgb decreased to 5.8 and patient received 2 U of blood with Hgb returning back to patient's baseline of 7. Patient also  developed crackles on lung exam and had CXR showing L lung base consolidation. Azithromycin was started and continued on discharge for total of 5 days, last dose 05/20/16.  Issues for Follow Up:  1. Patient should complete antibiotic course of pyelonephritis. 2. Patient should complete antibiotic course for presumed acute chest.  Significant Procedures: none  Significant Labs and Imaging:   Recent Labs Lab 05/16/16 1547 05/17/16 0243 05/18/16 0623  WBC 17.5* 15.5* 18.8*  HGB 6.6* 7.9* 7.3*  HCT 17.9* 21.7* 20.1*  PLT 341 349 356    Recent Labs Lab 05/14/16 1830 05/15/16 0338 05/15/16 1520 05/16/16 0154 05/17/16 0243 05/18/16 0623  NA 131* 131* 134* 133* 134* 135  K 3.9 3.7 3.8 3.8 3.7 3.8  CL 102 105 110 109 106 106  CO2 21* 20* 20* 17* 22 22  GLUCOSE 109* 108* 106* 91 101* 104*  BUN 21* 23* 20 14 8 10   CREATININE 1.30* 1.47* 1.57* 1.39* 0.99 0.97  CALCIUM 9.0 8.0* 7.7* 7.9* 8.6* 8.4*  ALKPHOS 77  --  56  --   --   --   AST 35  --  24  --   --   --   ALT 48  --  36  --   --   --   ALBUMIN 3.7  --  2.6*  --   --   --     Dg Chest 2 View  05/16/2016  CLINICAL DATA:  Shortness of breath and weakness. History of sickle cell disease. EXAM: CHEST  2 VIEW COMPARISON:  Chest radiograph May 15, 2016 FINDINGS: Cardiomediastinal silhouette is normal. Pulmonary vascular congestion. Patchy LEFT lung base airspace opacity. Strandy densities RIGHT lung base. Trachea projects midline and there is no pneumothorax. Soft tissue planes and included osseous  structures are non-suspicious. Intravenous catheter projects RIGHT antecubital fossa. IMPRESSION: Persists LEFT lung base consolidation concerning for pneumonia. RIGHT lung base atelectasis.Mild pulmonary vascular congestion. Electronically Signed   By: Elon Alas M.D.   On: 05/16/2016 14:28   US Abdomen Complete  05/15/2016  CLINICAL DATA:  Right flank and right upper quadrant pain, onset 2 days prior. EXAM: ABDOMEN ULTRASOUND  COMPLETE COMPARISON:  Abdominal ultrasound 06/12/2014.  CT 06/17/2014 FINDINGS: Gallbladder: Surgically absent. No abnormality in the gallbladder fossa. Common bile duct: Diameter: 4 mm. Liver: Appears prominent size, however direct measurement is difficult due to orientation. No focal lesion identified. Within normal limits in parenchymal echogenicity. Normal directional flow in the imaged main portal vein. IVC: No abnormality visualized. Pancreas: Visualized portion unremarkable. Spleen: Surgically absent. Right Kidney: Length: 15.2 cm. There is increased renal echogenicity. No mass or hydronephrosis visualized. No shadowing stone. Left Kidney: Length: 13.9 cm. There is increased renal echogenicity. No mass or hydronephrosis visualized. No shadowing stone. Abdominal aorta: No aneurysm visualized. Other findings: None.  No ascites. IMPRESSION: 1. Echogenic kidneys that are prominent in size, right greater than left. In a patient with sickle cell disease, papillary necrosis is considered. No obstructive uropathy or evident renal stone. 2. Suspected hepatomegaly. 3. Postcholecystectomy and splenectomy. Electronically Signed   By: Jeb Levering M.D.   On: 05/15/2016 18:52   Dg Chest Port 1 View  05/15/2016  CLINICAL DATA:  Sepsis.  Fever. EXAM: PORTABLE CHEST 1 VIEW COMPARISON:  PA and lateral chest 05/14/2016 and 11/04/2015. FINDINGS: Left lower lobe airspace disease is seen as on the most recent examination. The right lung is clear. Heart size is normal. No pneumothorax. There may be a small left pleural effusion. IMPRESSION: Persistent left lower lobe airspace disease which could be due to atelectasis or pneumonia. Electronically Signed   By: Inge Rise M.D.   On: 05/15/2016 15:18    Results/Tests Pending at Time of Discharge: none  Discharge Medications:    Medication List    STOP taking these medications        ibuprofen 200 MG tablet  Commonly known as:  ADVIL,MOTRIN      TAKE these  medications        azithromycin 250 MG tablet  Commonly known as:  ZITHROMAX  Take 1 tablet (250 mg total) by mouth daily.     cephALEXin 500 MG capsule  Commonly known as:  KEFLEX  Take 1 capsule (500 mg total) by mouth every 6 (six) hours.     folic acid 1 MG tablet  Commonly known as:  FOLVITE  Take 1 tablet (1 mg total) by mouth daily.     Hydrocodone-Acetaminophen 10-300 MG Tabs  Commonly known as:  VICODIN HP  Take 1 tablet by mouth every 4 hours for 2 days. Then take every 4 hours PRN for 2 days.     hydrocortisone 1 % lotion  Apply 1 application topically 2 (two) times daily.     ondansetron 4 MG disintegrating tablet  Commonly known as:  ZOFRAN ODT  Take 1 tablet (4 mg total) by mouth every 8 (eight) hours as needed for nausea or vomiting.     senna-docusate 8.6-50 MG tablet  Commonly known as:  Senokot-S  Take 1 tablet by mouth 2 (two) times daily.        Discharge Instructions: Please refer to Patient Instructions section of EMR for full details.  Patient was counseled important signs and symptoms that should prompt return to medical care,  changes in medications, dietary instructions, activity restrictions, and follow up appointments.   Follow-Up Appointments:     Follow-up Information    Follow up with Dimas Chyle, MD. Schedule an appointment as soon as possible for a visit in 1 week.   Specialty:  Family Medicine   Why:  For hospital follow-up   Contact information:   [id number] [address] [place], DO 05/18/2016, 10:08 PM PGY-1, [place]

## 2016-05-18 NOTE — Discharge Instructions (Signed)
Please continue to take these two antibiotics: Azithromycin - take once a day for the next two days (last dose 7/22) Keflex - take one tablet every six hours (4 times a day) for the next 10 days (last dose 7/30)  Also, please stop taking ibuprofen and medications containing ibuprofen (examples: Motrin, Aleve). Instead, you can take Tylenol for pain. You can continue to take the prescription pain medications you were already taking before you were hospitalized.   It is important to schedule a hospital follow-up appointment with your regular doctor (Dr. Jerline Pain) within about a week to make sure you are continuing to get better.

## 2016-05-18 NOTE — Progress Notes (Signed)
Family Medicine Teaching Service Daily Progress Note Intern Pager: [phone]  Patient name: [name] Medical record number: [id number] Date of birth: [birth year] Age: 20 y.o. Gender: female  Primary Care Provider: Dimas Chyle, MD Consultants: none Code Status: FULL  Pt Overview and Major Events to Date:  7/16 admitted for pyelonephritis  Assessment and Plan: [name] is a 20 y.o. female presenting with pyelonephritis. PMH is significant for sickle cell disease SS, tobacco abuse, intermittent asthma  Fever/Flank pain 2/2 pyelonephritis. Meets 2/4 SIRS on admission. qSOFA 0. UA consistent large leukocytes and few bacteria. WBC 44.9 and temperature of 100.67F on admission, patient was hypotensive yesterday after two NS bolus. Hgb 7.3 today, she received two units of blood yesterday. Blood and urine culture shows e coli that is pansensitive.  Afebrile overnight. Renal US showed echogenic kidneys that are prominent in size, right greater than left. In a patient with sickle cell disease, papillary necrosis is considered. No obstructive uropathy or evident renal stone. Nephrology was consulted, did not feel to be acute process - Transfer out of SDU today - Imipenem-cilastin 500 mg IV on (7/18-7/20), ceftriaxone CTX 2g IV on 7/16 -7/18. Deescalate ABX today to po cefazolin 500mg  q6, needs coverage for total of 14 days, last dose 7/30.  -Continue home norco for pain. DC morphine and dilaudid -Zofran prn nausea - DC IVF b/c patient is tolerating po well  Elevated beta hcg. Beta hcg elevated to 24.9 on admission. Unclear if patient actually pregnant or if this is a false positive result. LMP on 04/23/2016. There have been reports of hcg being falsely elevated in cases of e coli bacteremia - may the source of her result Urine quantitative hcg is 4 this morning effectively doubled from yesterday. But no concerns for pregnancy at the moment  AKI, resolved. Cr 0.97 Baseline 1.0 .  Likely secondary to pyelonephritis and dehydration. Patient at higher risk for renal insult given history of sickle cell disease. - monitor --Avoid NSAIDs  Weight Loss. Patient noted to have weight loss of 33lb over the past 6 months. Per patient, the place she is living in now "does not treat me very well." Patient reports that she had not eaten in several days prior to admission.    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 Patient Hemoglobin 7.3 at baseline close to 7-9. Good retic response. Does not appear to be in an active pain crisis. Patient had crackles on lung exam. CXR showed L lung base consolidation concerning for pneumonia. - Trend CBC - Continue home vicodin - Is not currently on hydroxyurea - Needs follow up with hematology after discharge -Trend Retic count - Continue azithromycin (7/18-) for total of 5 days, last dose 7/22  Intermittent Asthma. Stable - Albuterol as needed  FEN/GI: Regular diet  Prophylaxis: Lovenox  Disposition: Admitted pending above management. Anticipate discharge home once stable on PO antibiotics.   Subjective:  Patient still complaining of headaches. States chills and R flank pain have resolved. States feels well this morning and is amenable to switching to oral medications today.  Objective: Temp:  [98.1 F (36.7 C)-98.8 F (37.1 C)] 98.5 F (36.9 C) (07/20 0538) Pulse Rate:  [58-70] 58 (07/20 0538) Resp:  [16-20] 18 (07/20 0538) BP: (90-103)/(51-60) 90/51 mmHg (07/20 0538) SpO2:  [94 %-100 %] 100 % (07/20 0538)   Physical Exam:  General: thin, appearing 20 year old female laying comfortably  in bed Cardiovascular: RRR, no murmurs noted  Respiratory: CTAB, normal effort Abdomen: +bs, soft, nt, nd. No CVA tenderness Extremities: No cyanosis or edema  Laboratory:  Recent Labs Lab 05/16/16 1547 05/17/16 0243 05/18/16 0623  WBC 17.5* 15.5*  PENDING  HGB 6.6* 7.9* 7.3*  HCT 17.9* 21.7* 20.1*  PLT 341 349 PENDING    Recent Labs Lab 05/14/16 1830  05/15/16 1520 05/16/16 0154 05/17/16 0243  NA 131*  < > 134* 133* 134*  K 3.9  < > 3.8 3.8 3.7  CL 102  < > 110 109 106  CO2 21*  < > 20* 17* 22  BUN 21*  < > 20 14 8   CREATININE 1.30*  < > 1.57* 1.39* 0.99  CALCIUM 9.0  < > 7.7* 7.9* 8.6*  PROT 7.5  --  5.8*  --   --   BILITOT 13.5*  --  13.4*  --   --   ALKPHOS 77  --  56  --   --   ALT 48  --  36  --   --   AST 35  --  24  --   --   GLUCOSE 109*  < > 106* 91 101*  < > = values in this interval not displayed.  Beta HCG quant 4  Imaging/Diagnostic Tests: Dg Chest 2 View  05/16/2016  CLINICAL DATA:  Shortness of breath and weakness. History of sickle cell disease. EXAM: CHEST  2 VIEW COMPARISON:  Chest radiograph May 15, 2016 FINDINGS: Cardiomediastinal silhouette is normal. Pulmonary vascular congestion. Patchy LEFT lung base airspace opacity. Strandy densities RIGHT lung base. Trachea projects midline and there is no pneumothorax. Soft tissue planes and included osseous structures are non-suspicious. Intravenous catheter projects RIGHT antecubital fossa. IMPRESSION: Persists LEFT lung base consolidation concerning for pneumonia. RIGHT lung base atelectasis.Mild pulmonary vascular congestion. Electronically Signed   By: Elon Alas M.D.   On: 05/16/2016 14:28    Bufford Lope, DO 05/18/2016, 7:50 AM PGY-1, [place] Intern pager: [phone], text pages welcome

## 2016-05-18 NOTE — Progress Notes (Signed)
CSW received consult regarding food resources. Patient stated her uncle was coming to pick her up from the hospital and that she would be staying with him. CSW provided homeless resources, food resources, and transportation resources.  CSW signing off.  Percell Locus Sanae Willetts LCSWA [phone]

## 2016-05-18 NOTE — Care Management Note (Signed)
Case Management Note  Patient Details  Name: [name] MRN: [id number] Date of Birth: Dec 19, 1995  Subjective/Objective:                 Independent patient admitted with pyelonephritis. H/O SCA, covered with Medicare and Medicaid, [facility]CP [facility]. Patient states that she lives with her grandparents. CM asked patient if she had concerns about being homeless or not being treated well at home. She denied concerns.   Action/Plan:  CM will continue to follow for DC planning.  Expected Discharge Date:                  Expected Discharge Plan:  Home/Self Care  In-House Referral:     Discharge planning Services  CM Consult  Post Acute Care Choice:  NA Choice offered to:  NA  DME Arranged:  N/A DME Agency:  NA  HH Arranged:  NA HH Agency:  NA  Status of Service:  Completed, signed off  If discussed at [place] of Stay Meetings, dates discussed:    Additional Comments:  Carles Collet, RN 05/18/2016, 11:39 AM

## 2016-05-21 LAB — COCAINE,MS,WB/SP RFX
Benzoylecgonine: 72 ng/mL
Cocaine Confirmation: POSITIVE
Cocaine: NEGATIVE ng/mL

## 2016-05-24 LAB — DRUG SCREEN 10 W/CONF, SERUM
AMPHETAMINES, IA: NEGATIVE ng/mL
BARBITURATES, IA: NEGATIVE ug/mL
BENZODIAZEPINES, IA: NEGATIVE ng/mL
Cocaine & Metabolite, IA: POSITIVE ng/mL
Methadone, IA: NEGATIVE ng/mL
Opiates, IA: POSITIVE ng/mL
Oxycodones, IA: NEGATIVE ng/mL
Phencyclidine, IA: NEGATIVE ng/mL
Propoxyphene, IA: NEGATIVE ng/mL
THC(MARIJUANA) METABOLITE, IA: NEGATIVE ng/mL

## 2016-05-24 LAB — OPIATES,MS,WB/SP RFX
6-Acetylmorphine: NEGATIVE
CODEINE: NEGATIVE ng/mL
DIHYDROCODEINE: NEGATIVE ng/mL
Hydrocodone: 33.2 ng/mL
Hydromorphone: NEGATIVE ng/mL
MORPHINE: NEGATIVE ng/mL
Opiate Confirmation: POSITIVE

## 2016-05-24 LAB — OXYCODONES,MS,WB/SP RFX
OXYCOCONE: NEGATIVE ng/mL
OXYMORPHONE: NEGATIVE ng/mL
Oxycodones Confirmation: NEGATIVE

## 2016-06-02 ENCOUNTER — Telehealth: Payer: Self-pay | Admitting: Family Medicine

## 2016-06-02 NOTE — Telephone Encounter (Signed)
Will forward to MD to make him aware. Eniya Cannady,CMA  

## 2016-06-02 NOTE — Telephone Encounter (Signed)
Sharyn Lull from [facility] has been trying to get in touch with the pt. Said she would mail her a [employer]. ep

## 2016-07-20 ENCOUNTER — Emergency Department (HOSPITAL_COMMUNITY)
Admission: EM | Admit: 2016-07-20 | Discharge: 2016-07-20 | Disposition: A | Discharge status: Home / Self Care | Payer: Medicare Other | Attending: Emergency Medicine | Admitting: Emergency Medicine

## 2016-07-20 ENCOUNTER — Encounter (HOSPITAL_COMMUNITY): Payer: Self-pay

## 2016-07-20 ENCOUNTER — Emergency Department (HOSPITAL_COMMUNITY): Payer: Medicare Other

## 2016-07-20 DIAGNOSIS — Z79899 Other long term (current) drug therapy: Secondary | ICD-10-CM | POA: Insufficient documentation

## 2016-07-20 DIAGNOSIS — S01412A Laceration without foreign body of left cheek and temporomandibular area, initial encounter: Secondary | ICD-10-CM | POA: Insufficient documentation

## 2016-07-20 DIAGNOSIS — Y929 Unspecified place or not applicable: Secondary | ICD-10-CM | POA: Diagnosis not present

## 2016-07-20 DIAGNOSIS — Y999 Unspecified external cause status: Secondary | ICD-10-CM | POA: Diagnosis not present

## 2016-07-20 DIAGNOSIS — J4520 Mild intermittent asthma, uncomplicated: Secondary | ICD-10-CM | POA: Diagnosis not present

## 2016-07-20 DIAGNOSIS — S0181XA Laceration without foreign body of other part of head, initial encounter: Secondary | ICD-10-CM

## 2016-07-20 DIAGNOSIS — Z791 Long term (current) use of non-steroidal anti-inflammatories (NSAID): Secondary | ICD-10-CM | POA: Insufficient documentation

## 2016-07-20 DIAGNOSIS — K029 Dental caries, unspecified: Secondary | ICD-10-CM | POA: Insufficient documentation

## 2016-07-20 DIAGNOSIS — S0083XA Contusion of other part of head, initial encounter: Secondary | ICD-10-CM

## 2016-07-20 DIAGNOSIS — F17210 Nicotine dependence, cigarettes, uncomplicated: Secondary | ICD-10-CM | POA: Diagnosis not present

## 2016-07-20 DIAGNOSIS — Y9389 Activity, other specified: Secondary | ICD-10-CM | POA: Diagnosis not present

## 2016-07-20 MED ORDER — LIDOCAINE-EPINEPHRINE (PF) 2 %-1:200000 IJ SOLN
10.0000 mL | Freq: Once | INTRAMUSCULAR | Status: AC
  Administered 2016-07-20: 10 mL
  Filled 2016-07-20: qty 10

## 2016-07-20 MED ORDER — TETANUS-DIPHTH-ACELL PERTUSSIS 5-2.5-18.5 LF-MCG/0.5 IM SUSP
0.5000 mL | Freq: Once | INTRAMUSCULAR | Status: AC
  Administered 2016-07-20: 0.5 mL via INTRAMUSCULAR
  Filled 2016-07-20: qty 0.5
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
## 2016-07-20 NOTE — Discharge Instructions (Signed)
Keep your wound clean and dry. Apply antibiotic ointment once daily and cover with a sterile bandage. Use ice on your face to decrease swelling, 20 minutes on and 20 minutes off and be sure to keep a thin cloth between  your skin and the ice. Follow up here at the emergency department in 5 days to have your sutures removed.  Return to the emergency department sooner if you experience signs of infection (redness, warmth, pain, foul discharge, red streaks, fever) or headache, dizziness, visual changes, you pass out or any other concerning symptoms.

## 2016-07-20 NOTE — ED Notes (Signed)
Attempted to do vital signs. Pt asleep and refused to wake up. Would open her eyes and then go back to sleep. Will attempt again.

## 2016-07-20 NOTE — ED Notes (Signed)
Patient transported to CT 

## 2016-08-07 ENCOUNTER — Emergency Department (HOSPITAL_COMMUNITY): Payer: Medicare Other

## 2016-08-07 ENCOUNTER — Emergency Department (HOSPITAL_COMMUNITY)
Admission: EM | Admit: 2016-08-07 | Discharge: 2016-08-07 | Disposition: A | Discharge status: Home / Self Care | Payer: Medicare Other | Attending: Emergency Medicine | Admitting: Emergency Medicine

## 2016-08-07 ENCOUNTER — Encounter (HOSPITAL_COMMUNITY): Payer: Self-pay

## 2016-08-07 DIAGNOSIS — F17210 Nicotine dependence, cigarettes, uncomplicated: Secondary | ICD-10-CM | POA: Diagnosis not present

## 2016-08-07 DIAGNOSIS — Y999 Unspecified external cause status: Secondary | ICD-10-CM | POA: Diagnosis not present

## 2016-08-07 DIAGNOSIS — Y9301 Activity, walking, marching and hiking: Secondary | ICD-10-CM | POA: Insufficient documentation

## 2016-08-07 DIAGNOSIS — S0990XA Unspecified injury of head, initial encounter: Secondary | ICD-10-CM | POA: Insufficient documentation

## 2016-08-07 DIAGNOSIS — Y92410 Unspecified street and highway as the place of occurrence of the external cause: Secondary | ICD-10-CM | POA: Insufficient documentation

## 2016-08-07 DIAGNOSIS — J45909 Unspecified asthma, uncomplicated: Secondary | ICD-10-CM | POA: Insufficient documentation

## 2016-08-07 DIAGNOSIS — Z4802 Encounter for removal of sutures: Secondary | ICD-10-CM

## 2016-08-07 LAB — I-STAT BETA HCG BLOOD, ED (MC, WL, AP ONLY)

## 2016-08-07 LAB — BASIC METABOLIC PANEL
Anion gap: 8 (ref 5–15)
BUN: 7 mg/dL (ref 6–20)
CO2: 23 mmol/L (ref 22–32)
CREATININE: 0.83 mg/dL (ref 0.44–1.00)
Calcium: 9.1 mg/dL (ref 8.9–10.3)
Chloride: 110 mmol/L (ref 101–111)
GFR calc Af Amer: >60 mL/min (ref >60)
GLUCOSE: 93 mg/dL (ref 65–99)
Potassium: 3.8 mmol/L (ref 3.5–5.1)
SODIUM: 141 mmol/L (ref 135–145)

## 2016-08-07 LAB — CBC
HCT: 24.6 % — ABNORMAL LOW (ref 36.0–46.0)
Hemoglobin: 8.6 g/dL — ABNORMAL LOW (ref 12.0–15.0)
MCH: 33.1 pg (ref 26.0–34.0)
MCHC: 35 g/dL (ref 30.0–36.0)
MCV: 94.6 fL (ref 78.0–100.0)
PLATELETS: 440 10*3/uL — AB (ref 150–400)
RBC: 2.6 MIL/uL — AB (ref 3.87–5.11)
RDW: 19.3 % — AB (ref 11.5–15.5)
WBC: 19.1 10*3/uL — ABNORMAL HIGH (ref 4.0–10.5)

## 2016-08-07 LAB — ETHANOL: Alcohol, Ethyl (B): <5 mg/dL (ref <5)

## 2016-08-07 MED ORDER — LORAZEPAM 1 MG PO TABS
1.0000 mg | ORAL_TABLET | Freq: Once | ORAL | Status: DC
  Filled 2016-08-07: qty 1

## 2016-08-07 MED ORDER — NAPROXEN 375 MG PO TABS: 375.0000 mg | ORAL_TABLET | Freq: Two times a day (BID) | ORAL | 0 refills | Status: DC

## 2016-08-07 NOTE — ED Provider Notes (Signed)
Received care of patient from [facility] PA-C. Please see previous notes for H/P prior care  Patient now awake, alert, appropriate, eating, and ambulating without issues.  Discussed concussion precautions and recommend PCP follow up.  Patient discharged in stable condition with understanding of reasons to return.    Gareth Morgan, MD 08/09/16 [phone]
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
## 2016-08-07 NOTE — ED Notes (Signed)
Pt ambulated in hall, pt tolerated well. Provided with sandwich and juice.

## 2016-08-07 NOTE — Discharge Instructions (Addendum)
You should get help right away if: You have confusion or drowsiness. You feel sick to your stomach (nauseous) or have continued, forceful vomiting. You have dizziness or unsteadiness that is getting worse. You have severe, continued headaches not relieved by medicine. Only take over-the-counter or prescription medicines for pain, fever, or discomfort as directed by your health care provider. You do not have normal function of the arms or legs or are unable to walk. You notice changes in the black spots in the center of the colored part of your eye (pupil). You have a clear or bloody fluid coming from your nose or ears. You have a loss of vision. During the next 24 hours after the injury, you must stay with someone who can watch you for the warning signs. This person should contact local emergency services (911 in the [place] if you have seizures, you become unconscious, or you are unable to wake up.

## 2016-08-07 NOTE — ED Notes (Signed)
Pt sitting up eating meal provided. Alert. NAD

## 2016-08-07 NOTE — ED Notes (Signed)
PA Harris aware that patient is still very drowsy and does not appear safe for discharge at this time. PA advised this RN to hold patient in pod C for further observation until she is safe to be discharged.

## 2016-08-07 NOTE — ED Notes (Signed)
Attempted to get pt out of bed, she would not wake up.

## 2016-08-07 NOTE — ED Notes (Signed)
Pt remains very drowsy, unable to stay awake to receive discharge instructions
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
## 2016-08-07 NOTE — ED Notes (Signed)
Attempted to get pt out of bed. Pt did not wake up.

## 2016-08-07 NOTE — ED Notes (Signed)
MD at bedside. 

## 2016-08-07 NOTE — ED Notes (Signed)
Pt now aroused sitting in chair. Pt asking for cab voucher. Pt then with steady gait ambulated to the bathroom.   EDP [place] notified.

## 2016-08-07 NOTE — ED Notes (Signed)
Gave pt glass of water and cranberry juice with sandwich per pt request.

## 2016-08-07 NOTE — ED Notes (Signed)
Lab called for add-on specimen. Per Lab no urine is down in the LAB. Will collect.

## 2016-08-07 NOTE — ED Notes (Signed)
Pt on the telephone with grandparents. Pt will receive cab voucher to address [address] [place], [place] [address].

## 2016-08-07 NOTE — ED Notes (Signed)
Pt states she just peed and asked for some water. Given another warm blanket and some water to sip on for specimen.

## 2016-08-07 NOTE — ED Notes (Signed)
Dr. Horton at the bedside.  

## 2016-10-19 ENCOUNTER — Encounter (HOSPITAL_COMMUNITY): Payer: Self-pay | Admitting: Emergency Medicine

## 2016-10-19 ENCOUNTER — Emergency Department (HOSPITAL_COMMUNITY): Payer: Medicare Other

## 2016-10-19 ENCOUNTER — Inpatient Hospital Stay (HOSPITAL_COMMUNITY)
Admission: EM | Admit: 2016-10-19 | Discharge: 2016-10-21 | DRG: 811 | Disposition: A | Discharge status: Home / Self Care | Payer: Medicare Other | Attending: Internal Medicine | Admitting: Internal Medicine

## 2016-10-19 DIAGNOSIS — Z9081 Acquired absence of spleen: Secondary | ICD-10-CM

## 2016-10-19 DIAGNOSIS — Z23 Encounter for immunization: Secondary | ICD-10-CM

## 2016-10-19 DIAGNOSIS — D5700 Hb-SS disease with crisis, unspecified: Principal | ICD-10-CM | POA: Diagnosis present

## 2016-10-19 DIAGNOSIS — Z681 Body mass index (BMI) 19 or less, adult: Secondary | ICD-10-CM

## 2016-10-19 DIAGNOSIS — E43 Unspecified severe protein-calorie malnutrition: Secondary | ICD-10-CM | POA: Insufficient documentation

## 2016-10-19 DIAGNOSIS — K219 Gastro-esophageal reflux disease without esophagitis: Secondary | ICD-10-CM | POA: Diagnosis present

## 2016-10-19 DIAGNOSIS — Z8249 Family history of ischemic heart disease and other diseases of the circulatory system: Secondary | ICD-10-CM

## 2016-10-19 DIAGNOSIS — Z809 Family history of malignant neoplasm, unspecified: Secondary | ICD-10-CM

## 2016-10-19 DIAGNOSIS — E860 Dehydration: Secondary | ICD-10-CM | POA: Diagnosis present

## 2016-10-19 DIAGNOSIS — D72829 Elevated white blood cell count, unspecified: Secondary | ICD-10-CM | POA: Diagnosis present

## 2016-10-19 DIAGNOSIS — R079 Chest pain, unspecified: Secondary | ICD-10-CM | POA: Diagnosis present

## 2016-10-19 DIAGNOSIS — F17210 Nicotine dependence, cigarettes, uncomplicated: Secondary | ICD-10-CM | POA: Diagnosis present

## 2016-10-19 DIAGNOSIS — J4520 Mild intermittent asthma, uncomplicated: Secondary | ICD-10-CM | POA: Diagnosis present

## 2016-10-19 LAB — RETICULOCYTES
RBC.: 2.63 MIL/uL — AB (ref 3.87–5.11)
RETIC COUNT ABSOLUTE: 578.6 10*3/uL — AB (ref 19.0–186.0)
Retic Ct Pct: 22 % — ABNORMAL HIGH (ref 0.4–3.1)

## 2016-10-19 LAB — CBC WITH DIFFERENTIAL/PLATELET
BASOS PCT: 0 %
Basophils Absolute: 0 10*3/uL (ref 0.0–0.1)
EOS PCT: 0 %
Eosinophils Absolute: 0 10*3/uL (ref 0.0–0.7)
HEMATOCRIT: 25.2 % — AB (ref 36.0–46.0)
Hemoglobin: 9.4 g/dL — ABNORMAL LOW (ref 12.0–15.0)
LYMPHS PCT: 11 %
Lymphs Abs: 2.3 10*3/uL (ref 0.7–4.0)
MCH: 35.7 pg — ABNORMAL HIGH (ref 26.0–34.0)
MCHC: 37.3 g/dL — ABNORMAL HIGH (ref 30.0–36.0)
MCV: 95.8 fL (ref 78.0–100.0)
Monocytes Absolute: 1.3 10*3/uL — ABNORMAL HIGH (ref 0.1–1.0)
Monocytes Relative: 6 %
NEUTROS PCT: 83 %
Neutro Abs: 17.6 10*3/uL — ABNORMAL HIGH (ref 1.7–7.7)
PLATELETS: 502 10*3/uL — AB (ref 150–400)
RBC: 2.63 MIL/uL — AB (ref 3.87–5.11)
RDW: 18.9 % — AB (ref 11.5–15.5)
WBC: 21.2 10*3/uL — AB (ref 4.0–10.5)

## 2016-10-19 LAB — PREGNANCY, URINE: Preg Test, Ur: NEGATIVE

## 2016-10-19 LAB — URINALYSIS, ROUTINE W REFLEX MICROSCOPIC
Bilirubin Urine: NEGATIVE
Glucose, UA: NEGATIVE mg/dL
Ketones, ur: NEGATIVE mg/dL
Leukocytes, UA: NEGATIVE
Nitrite: NEGATIVE
Protein, ur: NEGATIVE mg/dL
Specific Gravity, Urine: 1.008 (ref 1.005–1.030)
pH: 6 (ref 5.0–8.0)

## 2016-10-19 LAB — PROTIME-INR
INR: 1.04
Prothrombin Time: 13.6 seconds (ref 11.4–15.2)

## 2016-10-19 LAB — COMPREHENSIVE METABOLIC PANEL
ALT: 77 U/L — ABNORMAL HIGH (ref 14–54)
ANION GAP: 8 (ref 5–15)
AST: 72 U/L — ABNORMAL HIGH (ref 15–41)
Albumin: 4.4 g/dL (ref 3.5–5.0)
Alkaline Phosphatase: 66 U/L (ref 38–126)
BUN: 15 mg/dL (ref 6–20)
CHLORIDE: 109 mmol/L (ref 101–111)
CO2: 22 mmol/L (ref 22–32)
Calcium: 9.1 mg/dL (ref 8.9–10.3)
Creatinine, Ser: 0.55 mg/dL (ref 0.44–1.00)
Glucose, Bld: 104 mg/dL — ABNORMAL HIGH (ref 65–99)
POTASSIUM: 5.3 mmol/L — AB (ref 3.5–5.1)
SODIUM: 139 mmol/L (ref 135–145)
Total Bilirubin: 6.3 mg/dL — ABNORMAL HIGH (ref 0.3–1.2)
Total Protein: 8.2 g/dL — ABNORMAL HIGH (ref 6.5–8.1)

## 2016-10-19 MED ORDER — HYDROMORPHONE HCL 2 MG/ML IJ SOLN
0.5000 mg | INTRAMUSCULAR | Status: DC
  Administered 2016-10-19: 1 mg via INTRAVENOUS
  Filled 2016-10-19: qty 1

## 2016-10-19 MED ORDER — HYDROMORPHONE HCL 2 MG/ML IJ SOLN
0.5000 mg | INTRAMUSCULAR | Status: AC
  Administered 2016-10-19: 1 mg via INTRAVENOUS
  Filled 2016-10-19: qty 1

## 2016-10-19 MED ORDER — HYDROMORPHONE HCL 2 MG/ML IJ SOLN: 0.5000 mg | INTRAMUSCULAR | Status: DC

## 2016-10-19 MED ORDER — HYDROMORPHONE HCL 2 MG/ML IJ SOLN
0.5000 mg | Freq: Once | INTRAMUSCULAR | Status: AC
  Administered 2016-10-19: 0.5 mg via SUBCUTANEOUS
  Filled 2016-10-19: qty 1

## 2016-10-19 MED ORDER — HYDROMORPHONE HCL 2 MG/ML IJ SOLN: 0.5000 mg | INTRAMUSCULAR | Status: AC

## 2016-10-19 MED ORDER — HYDROMORPHONE HCL 2 MG/ML IJ SOLN
0.5000 mg | INTRAMUSCULAR | Status: AC
Start: 2016-10-19 — End: 2016-10-19

## 2016-10-19 MED ORDER — ONDANSETRON 4 MG PO TBDP
4.0000 mg | ORAL_TABLET | Freq: Once | ORAL | Status: AC
  Administered 2016-10-19: 4 mg via ORAL
  Filled 2016-10-19: qty 1

## 2016-10-19 MED ORDER — SODIUM CHLORIDE 0.45 % IV SOLN
INTRAVENOUS | Status: DC
  Administered 2016-10-19: 20:00:00 via INTRAVENOUS

## 2016-10-19 NOTE — ED Provider Notes (Signed)
[facility] DEPT Provider Note   CSN: [id number] Arrival date & time: 10/19/16  1656     History   Chief Complaint Chief Complaint  Patient presents with  . Sickle Cell Pain Crisis    HPI [name] is a 20 y.o. female with a past medical history significant for sickle cell disease, asthma, and GERD who presents with pain. Patient reports that she has not had a significant pain crisis in over one year. She says that she does not take opioid pain medicines at home. The patient says that yesterday, while home, she began having a headache that was severe. She reported nausea and vomiting today. Patient does report chills but no fevers. She denies any change in urination. She reports some constipation that is chronic. Patient reports nausea and vomiting today. Patient says that her headache is located across her head and is not associated with photophobia or phonophobia. She says that she does not have a significant history of headaches. She denies any visual changes, numbness, tingling, or weakness of any extremities. She says her headache is different than her normal pain crises.  The history is provided by the patient and medical records. No language interpreter was used.  Sickle Cell Pain Crisis  Location:  Diffuse (and headache) Severity:  Severe Onset quality:  Gradual Duration:  2 days Similar to previous crisis episodes: yes   Timing:  Constant Progression:  Worsening Chronicity:  New Frequency of attacks:  Rare History of pulmonary emboli: no   Relieved by:  Nothing Worsened by:  Nothing Ineffective treatments:  None tried Associated symptoms: headaches   Associated symptoms: no chest pain, no congestion, no cough, no fatigue, no fever, no nausea, no shortness of breath and no vomiting   Risk factors: prior acute chest     Past Medical History:  Diagnosis Date  . Acute kidney injury ([place]) 05/15/2016  . Amenorrhea   . Anemia    SICKLE CELL  . Asthma   .  Drug-induced pruritus 02/26/2015  . GERD (gastroesophageal reflux disease)   . Headache   . Hyperbilirubinemia 02/26/2015  . Sickle cell disease [facility])     Patient Active Problem List   Diagnosis Date Noted  . Sepsis ([place])   . Anemia due to other cause   . Acute kidney injury ([place]) 05/15/2016  . Hypotension 05/15/2016  . Malnutrition of moderate degree 05/15/2016  . Gram-negative sepsis with organ dysfunction ([place])   . Acute pyelonephritis   . Hb-SS disease without crisis ([place])   . Leucocytosis 05/14/2016  . Pyelonephritis 05/14/2016  . Imprisonment and other incarceration   . Bibasilar crackles   . Hereditary hemolytic anemia ([place])   . Acute sickle cell crisis ([place]) 08/11/2015  . 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 Suicide ideation 03/11/2015  . Psychomotor agitation 03/11/2015  . Sickle cell anemia with pain ([place]) 03/10/2015  . Vasoocclusive sickle cell crisis ([place])   . Hyperbilirubinemia 02/26/2015  . Drug-induced pruritus 02/26/2015  . Sickle cell anemia with crisis ([place]) 02/25/2015  . Tobacco abuse   . Sickle cell crisis ([place])   . Leukocytosis   . Hematochezia 02/27/2014  . UTI (lower urinary tract infection) 02/26/2014  . Vaginal discharge 02/24/2014  . Sickle cell pain crisis ([place]) 11/23/2013  . Facial rash 03/25/2013  . Birth control counseling 08/14/2012  . Asthma, mild intermittent 12/14/2010  . Hb-SS disease with crisis ([place]) 10/12/2009    Past Surgical History:  Procedure Laterality Date  .  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 TONSILLECTOMY     Age 49    OB History    No data available       Home Medications    Prior to Admission medications   Medication Sig Start Date End Date Taking? Authorizing Provider  cephALEXin (KEFLEX) 500 MG capsule Take 1 capsule (500 mg total) by mouth every 6 (six) hours. Patient not taking: Reported on 08/07/2016 05/18/16   Verner Mould, MD  folic acid (FOLVITE) 1 MG  tablet Take 1 tablet (1 mg total) by mouth daily. Patient not taking: Reported on 08/07/2016 03/11/15   Vivi Barrack, MD  Hydrocodone-Acetaminophen (VICODIN HP) 10-300 MG TABS Take 1 tablet by mouth every 4 hours for 2 days. Then take every 4 hours PRN for 2 days. Patient not taking: Reported on 08/07/2016 11/08/15   Sela Hua, MD  hydrocortisone 1 % lotion Apply 1 application topically 2 (two) times daily. Patient not taking: Reported on 08/07/2016 12/01/14   Alvina Chou, PA-C  naproxen (NAPROSYN) 375 MG tablet Take 1 tablet (375 mg total) by mouth 2 (two) times daily. 08/07/16   Abigail Harris, PA-C  ondansetron (ZOFRAN ODT) 4 MG disintegrating tablet Take 1 tablet (4 mg total) by mouth every 8 (eight) hours as needed for nausea or vomiting. Patient not taking: Reported on 08/07/2016 12/01/14   Alvina Chou, PA-C  senna-docusate (SENOKOT-S) 8.6-50 MG tablet Take 1 tablet by mouth 2 (two) times daily. Patient not taking: Reported on 08/07/2016 08/15/15   Veatrice Bourbon, MD    Family History Family History  Problem Relation Age of Onset  . Hypertension Paternal Grandfather   . Sickle cell trait Father   . Cancer Mother     Died in 02-15-2009    Social History Social History  Substance Use Topics  . Smoking status: Current Every Day Smoker    Packs/day: 0.50    Types: Cigarettes  . Smokeless tobacco: Never Used  . Alcohol use No     Comment: Denies 06/12/2014     Allergies   Patient has no known allergies.   Review of Systems Review of Systems  Constitutional: Negative for activity change, chills, diaphoresis, fatigue and fever.  HENT: Negative for congestion and rhinorrhea.   Eyes: Negative for visual disturbance.  Respiratory: Negative for cough, chest tightness, shortness of breath and stridor.   Cardiovascular: Negative for chest pain, palpitations and leg swelling.  Gastrointestinal: Negative for abdominal distention, abdominal pain, constipation, diarrhea, nausea and  vomiting.  Genitourinary: Negative for difficulty urinating, dysuria, flank pain, frequency, hematuria, menstrual problem, pelvic pain, vaginal bleeding and vaginal discharge.  Musculoskeletal: Positive for back pain and myalgias. Negative for neck pain and neck stiffness.  Skin: Negative for rash and wound.  Neurological: Positive for headaches. Negative for dizziness, weakness, light-headedness and numbness.  Psychiatric/Behavioral: Negative for agitation and confusion.  All other systems reviewed and are negative.    Physical Exam Updated Vital Signs BP 110/68 (BP Location: Right Arm)   Pulse 103   Temp 98.9 F (37.2 C) (Oral)   Resp 18   SpO2 100%   Physical Exam  Constitutional: She is oriented to person, place, and time. She appears well-developed and well-nourished. No distress.  HENT:  Head: Normocephalic and atraumatic.  Eyes: Conjunctivae are normal.  Neck: Neck supple.  Cardiovascular: Regular rhythm.  Tachycardia present.   No murmur heard. Pulmonary/Chest: Effort normal and breath sounds normal. No respiratory distress.  Abdominal: Soft. There is no tenderness.  Musculoskeletal: She exhibits no edema.  Neurological: She is alert and oriented to person, place, and time. She is not disoriented. She displays no tremor. No cranial nerve deficit or sensory deficit. She exhibits normal muscle tone. She displays no seizure activity. Coordination and gait normal. GCS eye subscore is 4. GCS verbal subscore is 5. GCS motor subscore is 6.  Skin: Skin is warm and dry.  Psychiatric: She has a normal mood and affect.  Nursing note and vitals reviewed.    ED Treatments / Results  Labs (all labs ordered are listed, but only abnormal results are displayed) Labs Reviewed  COMPREHENSIVE METABOLIC PANEL - Abnormal; Notable for the following:       Result Value   Potassium 5.3 (*)    Glucose, Bld 104 (*)    Total Protein 8.2 (*)    AST 72 (*)    ALT 77 (*)    Total Bilirubin  6.3 (*)    All other components within normal limits  CBC WITH DIFFERENTIAL/PLATELET - Abnormal; Notable for the following:    WBC 21.2 (*)    RBC 2.63 (*)    Hemoglobin 9.4 (*)    HCT 25.2 (*)    MCH 35.7 (*)    MCHC 37.3 (*)    RDW 18.9 (*)    Platelets 502 (*)    Neutro Abs 17.6 (*)    Monocytes Absolute 1.3 (*)    All other components within normal limits  RETICULOCYTES - Abnormal; Notable for the following:    Retic Ct Pct 22.0 (*)    RBC. 2.63 (*)    Retic Count, Manual 578.6 (*)    All other components within normal limits  URINALYSIS, ROUTINE W REFLEX MICROSCOPIC - Abnormal; Notable for the following:    Hgb urine dipstick SMALL (*)    Bacteria, UA RARE (*)    Squamous Epithelial / LPF 0-5 (*)    All other components within normal limits  MAGNESIUM - Abnormal; Notable for the following:    Magnesium 1.5 (*)    All other components within normal limits  PREGNANCY, URINE  PROTIME-INR  PHOSPHORUS  TROPONIN I  TROPONIN I  CBC WITH DIFFERENTIAL/PLATELET    EKG  EKG Interpretation None       Radiology Dg Chest 2 View  Result Date: 10/19/2016 CLINICAL DATA:  Sickle cell crisis, pain began last night. EXAM: CHEST  2 VIEW COMPARISON:  05/16/2016 FINDINGS: Borderline cardiomegaly, unchanged. The lungs are clear. There is no effusion. Hilar and mediastinal contours are normal. Pulmonary vasculature is normal. IMPRESSION: Stable borderline cardiomegaly.  No acute findings. Electronically Signed   By: Andreas Newport M.D.   On: 10/19/2016 20:16   Ct Head Wo Contrast  Result Date: 10/19/2016 CLINICAL DATA:  Sickle cell pain, onset last night. EXAM: CT HEAD WITHOUT CONTRAST TECHNIQUE: Contiguous axial images were obtained from the base of the skull through the vertex without intravenous contrast. COMPARISON:  08/07/2016 FINDINGS: Brain: There is no intracranial hemorrhage, mass or evidence of acute infarction. There is no extra-axial fluid collection. Gray matter and  white matter appear normal. Cerebral volume is normal for age. Brainstem and posterior fossa are unremarkable. The CSF spaces appear normal. Vascular: No hyperdense vessel or unexpected calcification. Skull: Normal. Negative for fracture or focal lesion. Sinuses/Orbits: No acute finding. Other: None. IMPRESSION: Normal brain Electronically Signed   By: Andreas Newport M.D.   On: 10/19/2016 20:01    Procedures Procedures (  including critical care time)  Angiocath insertion with Ultrasound Performed by: Gwenyth Allegra Tegeler  Consent: Verbal consent obtained. Risks and benefits: risks, benefits and alternatives were discussed Time out: Immediately prior to procedure a "time out" was called to verify the correct patient, procedure, equipment, support staff and site/side marked as required.  Preparation: Patient was prepped and draped in the usual sterile fashion.  Vein Location: L upper arm  Ultrasound Guided  Gauge: 20  Normal blood return and flush without difficulty Patient tolerance: Patient tolerated the procedure well with no immediate complications.      Medications Ordered in ED Medications  HYDROmorphone (DILAUDID) injection 0.5-1 mg (not administered)    Or  HYDROmorphone (DILAUDID) injection 0.5-1 mg (not administered)  HYDROmorphone (DILAUDID) injection 0.5-1 mg (not administered)    Or  HYDROmorphone (DILAUDID) injection 0.5-1 mg (not administered)  folic acid (FOLVITE) tablet 1 mg (1 mg Oral Given 10/20/16 1009)  naloxone (NARCAN) injection 0.4 mg (not administered)    And  sodium chloride flush (NS) 0.9 % injection 9 mL (not administered)  ondansetron (ZOFRAN) injection 4 mg (not administered)  diphenhydrAMINE (BENADRYL) injection 12.5 mg (not administered)    Or  diphenhydrAMINE (BENADRYL) 12.5 MG/5ML elixir 12.5 mg (not administered)  enoxaparin (LOVENOX) injection 40 mg (40 mg Subcutaneous Given 10/20/16 0208)  dextrose 5 %-0.45 % sodium chloride infusion  ( Intravenous Rate/Dose Verify 10/20/16 1300)  hydrOXYzine (ATARAX/VISTARIL) tablet 25-50 mg (not administered)  senna-docusate (Senokot-S) tablet 1 tablet (1 tablet Oral Given 10/20/16 1009)  polyethylene glycol (MIRALAX / GLYCOLAX) packet 17 g (not administered)  ondansetron (ZOFRAN) tablet 4 mg ( Oral See Alternative 10/20/16 1200)    Or  ondansetron (ZOFRAN) injection 4 mg (4 mg Intravenous Given 10/20/16 1200)  HYDROmorphone (DILAUDID) 1 mg/mL PCA injection (0.8 mg Intravenous None Self Administered 10/20/16 1200)  ketorolac (TORADOL) 30 MG/ML injection 30 mg (30 mg Intravenous Given 10/20/16 1237)  bisacodyl (DULCOLAX) suppository 10 mg (not administered)  magnesium citrate solution 1 Bottle (not administered)  Influenza vac split quadrivalent PF (FLUARIX) injection 0.5 mL (not administered)  pneumococcal 23 valent vaccine (PNU-IMMUNE) injection 0.5 mL (not administered)  feeding supplement (ENSURE ENLIVE) (ENSURE ENLIVE) liquid 237 mL (not administered)  multivitamin with minerals tablet 1 tablet (not administered)  magnesium oxide (MAG-OX) tablet 400 mg (not administered)  HYDROmorphone (DILAUDID) injection 0.5 mg (0.5 mg Subcutaneous Given 10/19/16 1718)  ondansetron (ZOFRAN-ODT) disintegrating tablet 4 mg (4 mg Oral Given 10/19/16 1718)  HYDROmorphone (DILAUDID) injection 0.5-1 mg (1 mg Intravenous Given 10/19/16 2003)    Or  HYDROmorphone (DILAUDID) injection 0.5-1 mg ( Subcutaneous See Alternative 10/19/16 2003)     Initial Impression / Assessment and Plan / ED Course  I have reviewed the triage vital signs and the nursing notes.  Pertinent labs & imaging results that were available during my care of the patient were reviewed by me and considered in my medical decision making (see chart for details).  Clinical Course     [name] is a 20 y.o. female with a past medical history significant for sickle cell disease, asthma, and GERD who presents with  pain.  History and exam are seen above.  On exam, patient has diffuse body tenderness. Tenderness in her back, arms, and legs. Patient had symmetric pulses in all extremity. Patient had very mild abdominal tenderness but no significant chest tenderness. Patient had normal strength, sensation, and coordination on exam. Patient had symmetric and no neck tenderness.   Given patient's report of  headache with nausea and vomiting is different from prior, and her history of sickle cell disease, patient will have head CT. Patient also have a chest x-ray given her history of acute chest syndrome. Patient has had chills also have urinalysis to look for occult infection prompting her symptoms. Patient given pain medicine, nausea medicine, and fluids while she is worked.  Laboratory testing showed slightly increased potassium at 5.3. No evidence of kidney dysfunction. Reticulocyte count elevated. Hemoglobin Slightly improved from prior at 9.4. Patient does have leukocytosis however, no evidence of pneumonia or UTI.  CT head showed no acute intracranial Abnormality.  Patient continued to have pain after several doses of IV narcotic pain medicine. Given lack of pain relief, patient will be admitted for further sickle-cell pain crisis management. Patient remain slightly tachycardic. Fluids were continued.  Patient admitted in stable condition.   Final Clinical Impressions(s) / ED Diagnoses   Final diagnoses:  Sickle cell pain crisis (HCC)     Clinical Impression: 1. Sickle cell pain crisis [facility])     Disposition: Admit to Hospitalist service    Courtney Paris, MD 10/20/16 [phone]

## 2016-10-19 NOTE — ED Triage Notes (Signed)
Patient here from home with complaints of sickle cell pain crisis that started last night. Pain 10/10 generalized increased in head. Nausea, vomiting.

## 2016-10-19 NOTE — ED Notes (Signed)
EDP will start an [place] IV because 2 RNs have attempted IVs unsuccessfully

## 2016-10-19 NOTE — ED Notes (Signed)
RN will be drawing blood from [place] IV line

## 2016-10-19 NOTE — ED Notes (Signed)
Nurse is in the room starting an IV and collect labs

## 2016-10-19 NOTE — ED Notes (Signed)
Nurse has to do ultrasound IV so they will collect the lab

## 2016-10-20 ENCOUNTER — Encounter (HOSPITAL_COMMUNITY): Payer: Self-pay | Admitting: Internal Medicine

## 2016-10-20 ENCOUNTER — Inpatient Hospital Stay (HOSPITAL_COMMUNITY): Payer: Medicare Other

## 2016-10-20 DIAGNOSIS — K219 Gastro-esophageal reflux disease without esophagitis: Secondary | ICD-10-CM | POA: Diagnosis present

## 2016-10-20 DIAGNOSIS — E860 Dehydration: Secondary | ICD-10-CM

## 2016-10-20 DIAGNOSIS — Z8249 Family history of ischemic heart disease and other diseases of the circulatory system: Secondary | ICD-10-CM | POA: Diagnosis not present

## 2016-10-20 DIAGNOSIS — Z681 Body mass index (BMI) 19 or less, adult: Secondary | ICD-10-CM | POA: Diagnosis not present

## 2016-10-20 DIAGNOSIS — J4520 Mild intermittent asthma, uncomplicated: Secondary | ICD-10-CM | POA: Diagnosis present

## 2016-10-20 DIAGNOSIS — D5700 Hb-SS disease with crisis, unspecified: Secondary | ICD-10-CM | POA: Diagnosis present

## 2016-10-20 DIAGNOSIS — Z23 Encounter for immunization: Secondary | ICD-10-CM | POA: Diagnosis not present

## 2016-10-20 DIAGNOSIS — F17210 Nicotine dependence, cigarettes, uncomplicated: Secondary | ICD-10-CM | POA: Diagnosis present

## 2016-10-20 DIAGNOSIS — Z9081 Acquired absence of spleen: Secondary | ICD-10-CM | POA: Diagnosis not present

## 2016-10-20 DIAGNOSIS — R079 Chest pain, unspecified: Secondary | ICD-10-CM | POA: Diagnosis not present

## 2016-10-20 DIAGNOSIS — D72829 Elevated white blood cell count, unspecified: Secondary | ICD-10-CM | POA: Diagnosis present

## 2016-10-20 DIAGNOSIS — R0789 Other chest pain: Secondary | ICD-10-CM | POA: Diagnosis not present

## 2016-10-20 DIAGNOSIS — E43 Unspecified severe protein-calorie malnutrition: Secondary | ICD-10-CM | POA: Diagnosis present

## 2016-10-20 DIAGNOSIS — D72825 Bandemia: Secondary | ICD-10-CM

## 2016-10-20 DIAGNOSIS — Z809 Family history of malignant neoplasm, unspecified: Secondary | ICD-10-CM | POA: Diagnosis not present

## 2016-10-20 LAB — ECHOCARDIOGRAM COMPLETE
Height: 65 in
WEIGHTICAEL: 1890.66 [oz_av]

## 2016-10-20 LAB — MAGNESIUM: Magnesium: 1.5 mg/dL — ABNORMAL LOW (ref 1.7–2.4)

## 2016-10-20 LAB — TROPONIN I
Troponin I: <0.03 ng/mL (ref <0.03)
Troponin I: <0.03 ng/mL (ref <0.03)

## 2016-10-20 LAB — PHOSPHORUS: Phosphorus: 3.8 mg/dL (ref 2.5–4.6)

## 2016-10-20 MED ORDER — ONDANSETRON HCL 4 MG PO TABS: 4.0000 mg | ORAL_TABLET | ORAL | Status: DC | PRN

## 2016-10-20 MED ORDER — DIPHENHYDRAMINE HCL 50 MG/ML IJ SOLN: 12.5000 mg | Freq: Four times a day (QID) | INTRAMUSCULAR | Status: DC | PRN

## 2016-10-20 MED ORDER — ENSURE ENLIVE PO LIQD
237.0000 mL | Freq: Two times a day (BID) | ORAL | Status: DC
  Administered 2016-10-20: 237 mL via ORAL

## 2016-10-20 MED ORDER — HYDROXYZINE HCL 25 MG PO TABS
25.0000 mg | ORAL_TABLET | ORAL | Status: DC | PRN
  Administered 2016-10-20: 50 mg via ORAL
  Filled 2016-10-20: qty 2

## 2016-10-20 MED ORDER — DEXTROSE-NACL 5-0.45 % IV SOLN
INTRAVENOUS | Status: AC
  Administered 2016-10-20 (×2): via INTRAVENOUS

## 2016-10-20 MED ORDER — MAGNESIUM CITRATE PO SOLN: 1.0000 | Freq: Once | ORAL | Status: DC | PRN

## 2016-10-20 MED ORDER — INFLUENZA VAC SPLIT QUAD 0.5 ML IM SUSY
0.5000 mL | PREFILLED_SYRINGE | INTRAMUSCULAR | Status: AC
  Administered 2016-10-21: 0.5 mL via INTRAMUSCULAR
  Filled 2016-10-20 (×2): qty 0.5

## 2016-10-20 MED ORDER — ADULT MULTIVITAMIN W/MINERALS CH
1.0000 | ORAL_TABLET | Freq: Every day | ORAL | Status: DC
  Administered 2016-10-20 – 2016-10-21 (×2): 1 via ORAL
  Filled 2016-10-20 (×2): qty 1

## 2016-10-20 MED ORDER — NALOXONE HCL 0.4 MG/ML IJ SOLN: 0.4000 mg | INTRAMUSCULAR | Status: DC | PRN

## 2016-10-20 MED ORDER — MAGNESIUM OXIDE 400 (241.3 MG) MG PO TABS
400.0000 mg | ORAL_TABLET | Freq: Every day | ORAL | Status: DC
  Administered 2016-10-20 – 2016-10-21 (×2): 400 mg via ORAL
  Filled 2016-10-20 (×2): qty 1

## 2016-10-20 MED ORDER — SENNOSIDES-DOCUSATE SODIUM 8.6-50 MG PO TABS
1.0000 | ORAL_TABLET | Freq: Two times a day (BID) | ORAL | Status: DC
Start: 2016-10-20 — End: 2016-10-21
  Administered 2016-10-20 (×2): 1 via ORAL
  Filled 2016-10-20 (×4): qty 1

## 2016-10-20 MED ORDER — SODIUM CHLORIDE 0.9% FLUSH: 9.0000 mL | INTRAVENOUS | Status: DC | PRN

## 2016-10-20 MED ORDER — HYDROMORPHONE 1 MG/ML IV SOLN
INTRAVENOUS | Status: DC
  Administered 2016-10-20: 1.3 mg via INTRAVENOUS
  Administered 2016-10-20: 1.4 mg via INTRAVENOUS
  Administered 2016-10-20: 01:00:00 via INTRAVENOUS
  Administered 2016-10-21: 0.6 mg via INTRAVENOUS

## 2016-10-20 MED ORDER — ONDANSETRON HCL 4 MG/2ML IJ SOLN
4.0000 mg | Freq: Four times a day (QID) | INTRAMUSCULAR | Status: DC | PRN
  Filled 2016-10-20 (×2): qty 2

## 2016-10-20 MED ORDER — ENOXAPARIN SODIUM 40 MG/0.4ML ~~LOC~~ SOLN
40.0000 mg | Freq: Every day | SUBCUTANEOUS | Status: DC
  Administered 2016-10-20: 40 mg via SUBCUTANEOUS
  Filled 2016-10-20 (×2): qty 0.4

## 2016-10-20 MED ORDER — ONDANSETRON HCL 4 MG/2ML IJ SOLN
4.0000 mg | INTRAMUSCULAR | Status: DC | PRN
  Administered 2016-10-20 (×2): 4 mg via INTRAVENOUS

## 2016-10-20 MED ORDER — KETOROLAC TROMETHAMINE 30 MG/ML IJ SOLN
30.0000 mg | Freq: Four times a day (QID) | INTRAMUSCULAR | Status: DC
Start: 2016-10-20 — End: 2016-10-21
  Administered 2016-10-20 – 2016-10-21 (×7): 30 mg via INTRAVENOUS
  Filled 2016-10-20 (×7): qty 1

## 2016-10-20 MED ORDER — PNEUMOCOCCAL VAC POLYVALENT 25 MCG/0.5ML IJ INJ
0.5000 mL | INJECTION | INTRAMUSCULAR | Status: AC
  Administered 2016-10-21: 0.5 mL via INTRAMUSCULAR
  Filled 2016-10-20 (×2): qty 0.5

## 2016-10-20 MED ORDER — ENSURE ENLIVE PO LIQD
237.0000 mL | Freq: Three times a day (TID) | ORAL | Status: DC
  Administered 2016-10-21: 237 mL via ORAL

## 2016-10-20 MED ORDER — DIPHENHYDRAMINE HCL 12.5 MG/5ML PO ELIX: 12.5000 mg | ORAL_SOLUTION | Freq: Four times a day (QID) | ORAL | Status: DC | PRN

## 2016-10-20 MED ORDER — POLYETHYLENE GLYCOL 3350 17 G PO PACK: 17.0000 g | PACK | Freq: Every day | ORAL | Status: DC | PRN

## 2016-10-20 MED ORDER — FOLIC ACID 1 MG PO TABS
1.0000 mg | ORAL_TABLET | Freq: Every day | ORAL | Status: DC
  Administered 2016-10-20 – 2016-10-21 (×2): 1 mg via ORAL
  Filled 2016-10-20 (×2): qty 1

## 2016-10-20 MED ORDER — BISACODYL 10 MG RE SUPP
10.0000 mg | Freq: Every day | RECTAL | Status: DC | PRN
  Administered 2016-10-21: 10 mg via RECTAL
  Filled 2016-10-20: qty 1

## 2016-10-20 NOTE — Progress Notes (Signed)
Patient ID: [name], female   DOB: [birth year], 20 y.o.   MRN: [id number] Subjective:  Patient was admitted for sickle cell pain crisis and vomiting. No new complaint today, vomiting is responding to Zofran. ECHO done. Chest pain resolved.   Objective:  Vital signs in last 24 hours:  Vitals:   10/20/16 1004 10/20/16 1200 10/20/16 1441 10/20/16 1547  BP: (!) 102/58  104/66   Pulse: 83  84   Resp: 16 (!) 22 20 18   Temp: 98.5 F (36.9 C)  98.7 F (37.1 C)   TempSrc: Oral  Oral   SpO2: 97% 98% 98% 99%  Weight:      Height:       Intake/Output from previous day:   Intake/Output Summary (Last 24 hours) at 10/20/16 1652 Last data filed at 10/20/16 1300  Gross per 24 hour  Intake          1243.33 ml  Output                0 ml  Net          1243.33 ml    Physical Exam: General: Alert, awake, oriented x3, in no acute distress.  HEENT: [place]/AT PEERL, EOMI Neck: Trachea midline,  no masses, no thyromegal,y no JVD, no carotid bruit OROPHARYNX:  Moist, No exudate/ erythema/lesions.  Heart: Regular rate and rhythm, without murmurs, rubs, gallops, PMI non-displaced, no heaves or thrills on palpation.  Lungs: Clear to auscultation, no wheezing or rhonchi noted. No increased vocal fremitus resonant to percussion  Abdomen: Soft, nontender, nondistended, positive bowel sounds, no masses no hepatosplenomegaly noted..  Neuro: No focal neurological deficits noted cranial nerves II through XII grossly intact. DTRs 2+ bilaterally upper and lower extremities. Strength 5 out of 5 in bilateral upper and lower extremities. Musculoskeletal: No warm swelling or erythema around joints, no spinal tenderness noted. Psychiatric: Patient alert and oriented x3, good insight and cognition, good recent to remote recall. Lymph node survey: No cervical axillary or inguinal lymphadenopathy noted.  Lab Results:  Basic Metabolic Panel:    Component Value Date/Time   NA 139 10/19/2016 1708   K 5.3  (H) 10/19/2016 1708   CL 109 10/19/2016 1708   CO2 22 10/19/2016 1708   BUN 15 10/19/2016 1708   CREATININE 0.55 10/19/2016 1708   GLUCOSE 104 (H) 10/19/2016 1708   CALCIUM 9.1 10/19/2016 1708   CBC:    Component Value Date/Time   WBC 21.2 (H) 10/19/2016 1803   HGB 9.4 (L) 10/19/2016 1803   HCT 25.2 (L) 10/19/2016 1803   PLT 502 (H) 10/19/2016 1803   MCV 95.8 10/19/2016 1803   NEUTROABS 17.6 (H) 10/19/2016 1803   LYMPHSABS 2.3 10/19/2016 1803   MONOABS 1.3 (H) 10/19/2016 1803   EOSABS 0.0 10/19/2016 1803   BASOSABS 0.0 10/19/2016 1803   No results found for this or any previous visit (from the past 240 hour(s)).  Studies/Results: Dg Chest 2 View  Result Date: 10/19/2016 CLINICAL DATA:  Sickle cell crisis, pain began last night. EXAM: CHEST  2 VIEW COMPARISON:  05/16/2016 FINDINGS: Borderline cardiomegaly, unchanged. The lungs are clear. There is no effusion. Hilar and mediastinal contours are normal. Pulmonary vasculature is normal. IMPRESSION: Stable borderline cardiomegaly.  No acute findings. Electronically Signed   By: Andreas Newport M.D.   On: 10/19/2016 20:16   Ct Head Wo Contrast  Result Date: 10/19/2016 CLINICAL DATA:  Sickle cell pain, onset last night. EXAM: CT HEAD WITHOUT CONTRAST TECHNIQUE: Contiguous  axial images were obtained from the base of the skull through the vertex without intravenous contrast. COMPARISON:  08/07/2016 FINDINGS: Brain: There is no intracranial hemorrhage, mass or evidence of acute infarction. There is no extra-axial fluid collection. Gray matter and white matter appear normal. Cerebral volume is normal for age. Brainstem and posterior fossa are unremarkable. The CSF spaces appear normal. Vascular: No hyperdense vessel or unexpected calcification. Skull: Normal. Negative for fracture or focal lesion. Sinuses/Orbits: No acute finding. Other: None. IMPRESSION: Normal brain Electronically Signed   By: Andreas Newport M.D.   On: 10/19/2016 20:01     Medications: Scheduled Meds: . enoxaparin (LOVENOX) injection  40 mg Subcutaneous QHS  . feeding supplement (ENSURE ENLIVE)  237 mL Oral TID BM  . folic acid  1 mg Oral Daily  . HYDROmorphone   Intravenous Q4H  . [START ON 10/21/2016] Influenza vac split quadrivalent PF  0.5 mL Intramuscular Tomorrow-1000  . ketorolac  30 mg Intravenous Q6H  . magnesium oxide  400 mg Oral Daily  . multivitamin with minerals  1 tablet Oral Daily  . [START ON 10/21/2016] pneumococcal 23 valent vaccine  0.5 mL Intramuscular Tomorrow-1000  . senna-docusate  1 tablet Oral BID   Continuous Infusions: . dextrose 5 % and 0.45% NaCl 100 mL/hr at 10/20/16 1300   PRN Meds:.bisacodyl, diphenhydrAMINE **OR** diphenhydrAMINE, hydrOXYzine, magnesium citrate, naloxone **AND** sodium chloride flush, ondansetron (ZOFRAN) IV, ondansetron **OR** ondansetron (ZOFRAN) IV, polyethylene glycol  Consultants:  None  Procedures:  None  Antibiotics:  None  Assessment/Plan: Active Problems:   Asthma, mild intermittent   Sickle cell pain crisis (HCC)   Leukocytosis   Dehydration   Chest pain   1. Hb SS with crisis: Will continue Pain control per sickle cell protocol, continue IVF and Toradol 2. Chest Pain: MI ruled out, ECHO completed, LVEF 50 - 55%, dilated left atrium otherwise normal findings. EKG showed NSR, LVH. Enzymes were negative. Continue to monitor 3. Leukocytosis: Most likely related to crisis, no evidence of infection. Will monitor 4. Vomiting: Responding to Zofran. Frequency reduced, only once today, slowly tolerating PO intake. Will continue Zofran. 5. Anemia: Hb is stable. No indication for Blood Transfusion at this time 6. Chronic pain: Continue current regimen.  Code Status: Full Code Family Communication: N/A Disposition Plan: Patient asking to be discharged even though she had a recent emesis and ? Diarrhea. She was counseled and educated on need to continue admission to observe PO intake  and to get better pain control. Patient verbalized understanding.  Heidi Maclin  If 7PM-7AM, please contact night-coverage.  10/20/2016, 4:52 PM  LOS: 0 days

## 2016-10-20 NOTE — Plan of Care (Addendum)
Problem: Pain Managment: Goal: General experience of comfort will improve Outcome: Progressing Pain down to 6/10 with PCA use.   C/o nausea with 1 episode emesis.   Zofran given- effective.    Problem: Skin Integrity: Goal: Risk for impaired skin integrity will decrease Outcome: Progressing Braden score >18  Problem: Activity: Goal: Risk for activity intolerance will decrease Continue.   Problem: Fluid Volume: Goal: Ability to maintain a balanced intake and output will improve Outcome: Progressing .  Problem: Nutrition: Goal: Adequate nutrition will be maintained Continue.    Problem: Bowel/Gastric: Goal: Will not experience complications related to bowel motility Outcome: Progressing BM today, on Senna.

## 2016-10-20 NOTE — Progress Notes (Addendum)
Initial Nutrition Assessment  DOCUMENTATION CODES:   Severe malnutrition in context of chronic illness  INTERVENTION:   Ensure Enlive po TID, each supplement provides 350 kcal and 20 grams of protein  MVI daily   NUTRITION DIAGNOSIS:   Malnutrition related to chronic illness, other (see comment) (chronic drug use) as evidenced by severe depletion of body fat, severe depletion of muscle mass.  GOAL:   Patient will meet greater than or equal to 90% of their needs  MONITOR:   PO intake, Supplement acceptance  REASON FOR ASSESSMENT:   Malnutrition Screening Tool    ASSESSMENT:   20 y.o. female with medical history significant of sickle cell disease, GERD mild asthma. Admitted for sickle cell pain crisis.    Met with pt in room today. Pt reports fair appetite and eating 75% meals and drinking Ensure. Pt reports good appetite pta. Per chart, pt has lost 27lbs(19%) over the past 11 months. This is considered significant. Pt reports that she used to weigh 165lbs.   Medications reviewed and include: lovenox, folic acid, hydromorphone, senokot, zofran  Labs reviewed: K 5.3(H), P 3.8 wnl, Mg 1.5(L) AST 72(H), ALT 77(H), tbili 6.3(H) Wbc 21.2(H), hgb 9.4(L), Hct 25.2(L)-12/21 labs  Nutrition-Focused physical exam completed. Findings are severe fat depletion, severe muscle depletion, and no edema.   Diet Order:  Diet Heart Room service appropriate? Yes; Fluid consistency: Thin  Skin:  Reviewed, no issues  Last BM:  12/21  Height:   Ht Readings from Last 1 Encounters:  10/20/16 '5\' 5"'  (1.651 m)    Weight:   Wt Readings from Last 1 Encounters:  10/20/16 118 lb 2.7 oz (53.6 kg)    Ideal Body Weight:  56.8 kg  BMI:  Body mass index is 19.66 kg/m.  Estimated Nutritional Needs:   Kcal:  1400-1700kcal/day   Protein:  64-80g/day   Fluid:  1.5L/day   EDUCATION NEEDS:   No education needs identified at this time  Koleen Distance, RD, LDN Pager #[phone]

## 2016-10-20 NOTE — Progress Notes (Signed)
  Echocardiogram 2D Echocardiogram has been performed.  [name] 10/20/2016, 1:57 PM

## 2016-10-20 NOTE — H&P (Signed)
[name] [id number] DOB: 1996/02/06 DOA: 10/19/2016     PCP: Dimas Chyle, MD   Outpatient Specialists: none Patient coming from:  home Lives alone,     Chief Complaint: Headache  HPI: [name] is a 20 y.o. female with medical history significant of sickle cell disease, GERD mild asthma    Presented with sickle cell like pain starting yesterday pain 10 out of time and generalized pain including chest pain and headache and nausea and vomiting which is somewhat atypical for her. She has throbbing thigh pain which is more typical for her sickle cell crisis.   Forced that she hasn't had a pain crisis over a year ago she doesn't take any opioids at home. She reports some chills but no fevers constipation which is chronic. No neck pain no photophobia phonophobia. No visual changes or neurological changes denies  palpitations no shortness of breath no cough or wheezing. Reports currently pain down to 7/10. Regarding pertinent Chronic problems: Patient has history of depression of his past admission for suicidal ideation   IN ER:  Temp (24hrs), Avg:99.2 F (37.3 C), Min:98.9 F (37.2 C), Max:99.4 F (37.4 C)   RR 20 oxygen saturation 95% on room air blood pressure 114/71 HR 108 White blood cell count 21.2 which is close to baseline hemoglobin 9.4 which is up from baseline  T count 22 which is elevated potassium 5.3 AST 72 ALT 77 total bili 6.3 which is elevated  Chest x-ray stable borderline cardiomegaly nonacute CT head nonacute Following Medications were ordered in ER: Medications  0.45 % sodium chloride infusion ( Intravenous New Bag/Given 10/19/16 1941)  HYDROmorphone (DILAUDID) injection 0.5-1 mg (not administered)    Or  HYDROmorphone (DILAUDID) injection 0.5-1 mg (not administered)  HYDROmorphone (DILAUDID) injection 0.5-1 mg (not administered)    Or  HYDROmorphone (DILAUDID) injection 0.5-1 mg (not administered)  HYDROmorphone (DILAUDID)  injection 0.5-1 mg (1 mg Intravenous Given 10/19/16 2149)    Or  HYDROmorphone (DILAUDID) injection 0.5-1 mg ( Subcutaneous See Alternative 10/19/16 2149)  HYDROmorphone (DILAUDID) injection 0.5 mg (0.5 mg Subcutaneous Given 10/19/16 1718)  ondansetron (ZOFRAN-ODT) disintegrating tablet 4 mg (4 mg Oral Given 10/19/16 1718)  HYDROmorphone (DILAUDID) injection 0.5-1 mg (1 mg Intravenous Given 10/19/16 2003)    Or  HYDROmorphone (DILAUDID) injection 0.5-1 mg ( Subcutaneous See Alternative 10/19/16 2003)       Hospitalist was called for admission for Consult pain crisis  Review of Systems:    Pertinent positives include: chills,headaches  Constitutional:  No weight loss, night sweats, Fevers,  fatigue, weight loss  HEENT:  No , Difficulty swallowing,Tooth/dental problems,Sore throat,  No sneezing, itching, ear ache, nasal congestion, post nasal drip,  Cardio-vascular:  No chest pain, Orthopnea, PND, anasarca, dizziness, palpitations.no Bilateral lower extremity swelling  GI:  No heartburn, indigestion, abdominal pain, nausea, vomiting, diarrhea, change in bowel habits, loss of appetite, melena, blood in stool, hematemesis Resp:  no shortness of breath at rest. No dyspnea on exertion, No excess mucus, no productive cough, No non-productive cough, No coughing up of blood.No change in color of mucus.No wheezing. Skin:  no rash or lesions. No jaundice GU:  no dysuria, change in color of urine, no urgency or frequency. No straining to urinate.  No flank pain.  Musculoskeletal:  No joint pain or no joint swelling. No decreased range of motion. No back pain.  Psych:  No change in mood or affect. No depression or anxiety. No memory loss.  Neuro: no localizing  neurological complaints, no tingling, no weakness, no double vision, no gait abnormality, no slurred speech, no confusion  As per HPI otherwise 10 point review of systems negative.   Past Medical History: Past Medical History:    Diagnosis Date  . Acute kidney injury ([place]) 05/15/2016  . Amenorrhea   . Anemia    SICKLE CELL  . Asthma   . Drug-induced pruritus 02/26/2015  . GERD (gastroesophageal reflux disease)   . Headache   . Hyperbilirubinemia 02/26/2015  . Sickle cell disease ([place])    Past Surgical History:  Procedure Laterality Date  . 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 TONSILLECTOMY     Age 32     Social History:  Ambulatory   independently      reports that she has been smoking Cigarettes.  She has been smoking about 0.50 packs per day. She has never used smokeless tobacco. She reports that she uses drugs, including Marijuana. She reports that she does not drink alcohol.  Allergies:  No Known Allergies     Family History:   Family History  Problem Relation Age of Onset  . Hypertension Paternal Grandfather   . Sickle cell trait Father   . Cancer Mother     Died in 02-15-09    Medications: Prior to Admission medications   Medication Sig Start Date End Date Taking? Authorizing Provider  cephALEXin (KEFLEX) 500 MG capsule Take 1 capsule (500 mg total) by mouth every 6 (six) hours. Patient not taking: Reported on 08/07/2016 05/18/16   Verner Mould, MD  folic acid (FOLVITE) 1 MG tablet Take 1 tablet (1 mg total) by mouth daily. Patient not taking: Reported on 08/07/2016 03/11/15   Vivi Barrack, MD  hydrocortisone 1 % lotion Apply 1 application topically 2 (two) times daily. Patient not taking: Reported on 08/07/2016 12/01/14   Alvina Chou, PA-C  naproxen (NAPROSYN) 375 MG tablet Take 1 tablet (375 mg total) by mouth 2 (two) times daily. Patient not taking: Reported on 10/19/2016 08/07/16   Margarita Mail, PA-C    Physical Exam: Patient Vitals for the past 24 hrs:  BP Temp Temp src Pulse Resp SpO2 Height  10/19/16 2236/02/16 115/71 - - 108 20 100 % -  10/19/16 2099/02/15 111/68 - - - 19 - -  10/19/16 2000 105/66 - - - 23 - -  10/19/16 1939 - - - - - - 5\' 5"  (1.651 m)  10/19/16  1938 - - - - - 100 % -  10/19/16 1709 110/68 98.9 F (37.2 C) Oral 103 18 100 % -  10/19/16 1702 - 99.4 F (37.4 C) Oral (!) 123 20 96 % -    1. General:  in No Acute distress 2. Psychological: Alert and   Oriented 3. Head/ENT:     Dry Mucous Membranes                          Head Non traumatic, neck supple                          Normal   Dentition 4. SKIN:   decreased Skin turgor,  Skin clean Dry and intact no rash 5. Heart: Regular rate and rhythm no Murmur, Rub or gallop 6. Lungs Clear to auscultation bilaterally, no wheezes or crackles   7. Abdomen: Soft,  non-tender, Non distended 8. Lower extremities: no clubbing, cyanosis, or edema 9.  Neurologically  strength 5 out of 5 in all 4 extremities cranial nerves II through XII intact 10. MSK: Normal range of motion   body mass index is unknown because there is no height or weight on file.  Labs on Admission:   Labs on Admission: I have personally reviewed following labs and imaging studies  CBC:  Recent Labs Lab 10/19/16 1803  WBC 21.2*  NEUTROABS 17.6*  HGB 9.4*  HCT 25.2*  MCV 95.8  PLT [id number]*   Basic Metabolic Panel:  Recent Labs Lab 10/19/16 1708  NA 139  K 5.3*  CL 109  CO2 22  GLUCOSE 104*  BUN 15  CREATININE 0.55  CALCIUM 9.1   GFR: CrCl cannot be calculated (Unknown ideal weight.). Liver Function Tests:  Recent Labs Lab 10/19/16 1708  AST 72*  ALT 77*  ALKPHOS 66  BILITOT 6.3*  PROT 8.2*  ALBUMIN 4.4   No results for input(s): LIPASE, AMYLASE in the last 168 hours. No results for input(s): AMMONIA in the last 168 hours. Coagulation Profile:  Recent Labs Lab 10/19/16 1930  INR 1.04   Cardiac Enzymes: No results for input(s): CKTOTAL, CKMB, CKMBINDEX, TROPONINI in the last 168 hours. BNP (last 3 results) No results for input(s): PROBNP in the last 8760 hours. HbA1C: No results for input(s): HGBA1C in the last 72 hours. CBG: No results for input(s): GLUCAP in the last 168  hours. Lipid Profile: No results for input(s): CHOL, HDL, LDLCALC, TRIG, CHOLHDL, LDLDIRECT in the last 72 hours. Thyroid Function Tests: No results for input(s): TSH, T4TOTAL, FREET4, T3FREE, THYROIDAB in the last 72 hours. Anemia Panel:  Recent Labs  10/19/16 1803  RETICCTPCT 22.0*   Urine analysis:    Component Value Date/Time   COLORURINE YELLOW 10/19/2016 [place] 10/19/2016 1840   LABSPEC 1.008 10/19/2016 1840   PHURINE 6.0 10/19/2016 1840   GLUCOSEU NEGATIVE 10/19/2016 1840   HGBUR SMALL (A) 10/19/2016 1840   BILIRUBINUR NEGATIVE 10/19/2016 1840   KETONESUR NEGATIVE 10/19/2016 1840   PROTEINUR NEGATIVE 10/19/2016 1840   UROBILINOGEN 1.0 08/11/2015 1948   NITRITE NEGATIVE 10/19/2016 1840   LEUKOCYTESUR NEGATIVE 10/19/2016 1840   Sepsis Labs: @LABRCNTIP (procalcitonin:4,lacticidven:4) )No results found for this or any previous visit (from the past 240 hour(s)).    UA  no evidence of UTI   No results found for: HGBA1C  CrCl cannot be calculated (Unknown ideal weight.).  BNP (last 3 results) No results for input(s): PROBNP in the last 8760 hours.   ECG REPORT ordered  There were no vitals filed for this visit.   Cultures:    Component Value Date/Time   SDES BLOOD LEFT ANTECUBITAL 05/14/2016 2028   SPECREQUEST IN PEDIATRIC BOTTLE 4CC 05/14/2016 2028   CULT (A) 05/14/2016 2028    ESCHERICHIA COLI SUSCEPTIBILITIES PERFORMED ON PREVIOUS CULTURE WITHIN THE LAST 5 DAYS.    REPTSTATUS 05/17/2016 FINAL 05/14/2016 2028     Radiological Exams on Admission: Dg Chest 2 View  Result Date: 10/19/2016 CLINICAL DATA:  Sickle cell crisis, pain began last night. EXAM: CHEST  2 VIEW COMPARISON:  05/16/2016 FINDINGS: Borderline cardiomegaly, unchanged. The lungs are clear. There is no effusion. Hilar and mediastinal contours are normal. Pulmonary vasculature is normal. IMPRESSION: Stable borderline cardiomegaly.  No acute findings. Electronically Signed    By: Andreas Newport M.D.   On: 10/19/2016 20:16   Ct Head Wo Contrast  Result Date: 10/19/2016 CLINICAL DATA:  Sickle cell pain, onset last night. EXAM: CT HEAD WITHOUT  CONTRAST TECHNIQUE: Contiguous axial images were obtained from the base of the skull through the vertex without intravenous contrast. COMPARISON:  08/07/2016 FINDINGS: Brain: There is no intracranial hemorrhage, mass or evidence of acute infarction. There is no extra-axial fluid collection. Gray matter and white matter appear normal. Cerebral volume is normal for age. Brainstem and posterior fossa are unremarkable. The CSF spaces appear normal. Vascular: No hyperdense vessel or unexpected calcification. Skull: Normal. Negative for fracture or focal lesion. Sinuses/Orbits: No acute finding. Other: None. IMPRESSION: Normal brain Electronically Signed   By: Andreas Newport M.D.   On: 10/19/2016 20:01    Chart has been reviewed    Assessment/Plan  20 y.o. female with medical history significant of sickle cell disease, GERD mild asthma admitted for sickle cell crisis and dehydration   Present on Admission: . Sickle cell pain crisis ([place]) -   will admit per sickle cell protocol,    control pain,    hydrate with IVF D5 .45% Saline @ 100 mls/hour,    Weight based Dilaudid PCA  patient does not take opioids and regular basis will administer low-dose PCA  patient apearssedated   continue   folic acid   Transfuse as needed if Hg drops significantly below baseline.    No evidence of acute chest at this time   Sickle cell team to take over management in AM  will need to establish out patient care Chest pain - monitor on tele, afebrile CXR without infiltrates appears non toxic continue to monitor.  . Asthma, mild intermittent stable continue to monitor . Leukocytosis has recurrent leukocytosis likely secondary to sickle cell disease . Dehydration will administer IV fluids and monitor    Other plan as per orders.  DVT  prophylaxis:    Lovenox     Code Status:  FULL CODE  as per patient    Family Communication:   Family not  at  Bedside    Disposition Plan:    To home once workup is complete and patient is stable                     Consults called: none  Admission status: inpatient      Level of care     tele        I have spent a total of 57 min on this admission  Kemontae Dunklee 10/20/2016, 1:06 AM    [facility]  Pager [phone]   after 2 AM please page floor coverage PA If 7AM-7PM, please contact the day team taking care of the patient  [email or web address]  Password [email or web address]

## 2016-10-20 NOTE — ED Notes (Addendum)
EKG given to Hospitalist,[place].

## 2016-10-21 DIAGNOSIS — R079 Chest pain, unspecified: Secondary | ICD-10-CM

## 2016-10-21 DIAGNOSIS — E43 Unspecified severe protein-calorie malnutrition: Secondary | ICD-10-CM

## 2016-10-21 LAB — CBC WITH DIFFERENTIAL/PLATELET
BASOS ABS: 0 10*3/uL (ref 0.0–0.1)
Basophils Relative: 0 %
EOS ABS: 0.1 10*3/uL (ref 0.0–0.7)
Eosinophils Relative: 1 %
HCT: 20.2 % — ABNORMAL LOW (ref 36.0–46.0)
Hemoglobin: 7.6 g/dL — ABNORMAL LOW (ref 12.0–15.0)
LYMPHS ABS: 2.2 10*3/uL (ref 0.7–4.0)
Lymphocytes Relative: 16 %
MCH: 36.5 pg — ABNORMAL HIGH (ref 26.0–34.0)
MCHC: 37.6 g/dL — AB (ref 30.0–36.0)
MCV: 97.1 fL (ref 78.0–100.0)
MONO ABS: 1.2 10*3/uL — AB (ref 0.1–1.0)
Monocytes Relative: 9 %
NEUTROS ABS: 10.2 10*3/uL — AB (ref 1.7–7.7)
Neutrophils Relative %: 74 %
PLATELETS: 478 10*3/uL — AB (ref 150–400)
RBC: 2.08 MIL/uL — AB (ref 3.87–5.11)
RDW: 18 % — AB (ref 11.5–15.5)
WBC: 13.7 10*3/uL — AB (ref 4.0–10.5)

## 2016-10-21 LAB — BASIC METABOLIC PANEL
ANION GAP: 7 (ref 5–15)
BUN: 16 mg/dL (ref 6–20)
CALCIUM: 8.2 mg/dL — AB (ref 8.9–10.3)
CO2: 24 mmol/L (ref 22–32)
CREATININE: 0.74 mg/dL (ref 0.44–1.00)
Chloride: 105 mmol/L (ref 101–111)
GLUCOSE: 92 mg/dL (ref 65–99)
Potassium: 3.3 mmol/L — ABNORMAL LOW (ref 3.5–5.1)
Sodium: 136 mmol/L (ref 135–145)

## 2016-10-21 LAB — MAGNESIUM: MAGNESIUM: 1.7 mg/dL (ref 1.7–2.4)

## 2016-10-21 MED ORDER — DEXTROSE-NACL 5-0.45 % IV SOLN
INTRAVENOUS | Status: AC
  Administered 2016-10-21: 07:00:00 via INTRAVENOUS

## 2016-10-21 MED ORDER — POTASSIUM CHLORIDE CRYS ER 20 MEQ PO TBCR
40.0000 meq | EXTENDED_RELEASE_TABLET | Freq: Once | ORAL | Status: AC
  Administered 2016-10-21: 40 meq via ORAL
  Filled 2016-10-21: qty 2

## 2016-10-21 NOTE — Discharge Instructions (Signed)
You may call the sickle cell medical center to schedule appointment for follow up hospital admission.

## 2016-10-21 NOTE — Progress Notes (Signed)
Wasted 18 ml Dilaudid PCA in sink with Arlie Solomons RN

## 2016-10-21 NOTE — Discharge Summary (Signed)
Physician Discharge Summary  Marilu Wahler Groner [id number] DOB: 04/08/1996 DOA: 10/19/2016  PCP: Dimas Chyle, MD  Admit date: 10/19/2016  Discharge date: 10/21/2016  Discharge Diagnoses:  Active Problems:   Asthma, mild intermittent   Sickle cell pain crisis (HCC)   Leukocytosis   Protein-calorie malnutrition, severe   Discharge Condition: Stable  Disposition:  Follow-up Information    Dimas Chyle, MD Follow up in 1 week(s).   Specialty:  Family Medicine Contact information: [id number] [place] [address] [phone]           Diet: Regular  Wt Readings from Last 3 Encounters:  10/20/16 118 lb 2.7 oz (53.6 kg)  08/07/16 134 lb (60.8 kg)  05/16/16 121 lb 9.6 oz (55.2 kg)    History of present illness:  [name] is a 20 y.o. female with medical history significant of sickle cell disease, GERD mild asthma who presented with sickle cell pain starting yesterday, pain 10/10 and generalized pain including chest pain and headache and nausea and vomiting which is somewhat atypical for her. She has throbbing thigh pain which is more typical for her sickle cell crisis. She reports some chills but no fevers, constipation which is chronic. No neck pain, no photophobia, phonophobia. No visual changes or neurological changes, denies palpitations, no shortness of breath, no cough or wheezing. Reports currently pain down to 7/10.  Regarding pertinent Chronic problems: Patient has history of depression, past admission for suicidal ideation  IN ER:  Temp (24hrs), Avg:99.2 F (37.3 C), Min:98.9 F (37.2 C), Max:99.4 F (37.4 C), RR 20 oxygen saturation 95% on room air blood pressure 114/71 HR 108, White blood cell count 21.2 which is close to baseline hemoglobin 9.4 which is up from baseline.   Hospital Course:  Patient was admitted for Sickle Cell Pain Crisis and managed with the standard Sickle Cell Pain management protocol with IV Dilaudid via PCA,  clinician assisted doses, IV Toradol and other adjunct therapies. She improved significantly, pain reduced from 10/10 to 2/10. She had no fever. Vomiting resolved as well as diarrhea. She was discharged home in a hemodynamically stable condition to follow up with PCP within 1 week of discharge.  Discharge Exam: Vitals:   10/21/16 0426 10/21/16 0839  BP: 103/62   Pulse: 91   Resp: 12 15  Temp: 99.5 F (37.5 C)    Vitals:   10/21/16 0238 10/21/16 0400 10/21/16 0426 10/21/16 0839  BP: (!) 100/52  103/62   Pulse: 83  91   Resp: 18 (!) 22 12 15   Temp: 98.9 F (37.2 C)  99.5 F (37.5 C)   TempSrc: Oral  Oral   SpO2: 96% 96% 96% 100%  Weight:      Height:        General appearance : Awake, alert, not in any distress. Speech Clear. Not toxic looking HEENT: Atraumatic and Normocephalic, pupils equally reactive to light and accomodation Neck: Supple, no JVD. No cervical lymphadenopathy.  Chest: Good air entry bilaterally, no added sounds  CVS: S1 S2 regular, no murmurs.  Abdomen: Bowel sounds present, Non tender and not distended with no gaurding, rigidity or rebound. Extremities: B/L Lower Ext shows no edema, both legs are warm to touch Neurology: Awake alert, and oriented X 3, CN II-XII intact, Non focal Skin: No Rash  Discharge Instructions   Allergies as of 10/21/2016   No Known Allergies     Medication List    STOP taking these medications   cephALEXin 500  MG capsule Commonly known as:  KEFLEX     TAKE these medications   folic acid 1 MG tablet Commonly known as:  FOLVITE Take 1 tablet (1 mg total) by mouth daily.   hydrocortisone 1 % lotion Apply 1 application topically 2 (two) times daily.   naproxen 375 MG tablet Commonly known as:  NAPROSYN Take 1 tablet (375 mg total) by mouth 2 (two) times daily.        The results of significant diagnostics from this hospitalization (including imaging, microbiology, ancillary and laboratory) are listed below for  reference.    Significant Diagnostic Studies: Dg Chest 2 View  Result Date: 10/19/2016 CLINICAL DATA:  Sickle cell crisis, pain began last night. EXAM: CHEST  2 VIEW COMPARISON:  05/16/2016 FINDINGS: Borderline cardiomegaly, unchanged. The lungs are clear. There is no effusion. Hilar and mediastinal contours are normal. Pulmonary vasculature is normal. IMPRESSION: Stable borderline cardiomegaly.  No acute findings. Electronically Signed   By: Andreas Newport M.D.   On: 10/19/2016 20:16   Ct Head Wo Contrast  Result Date: 10/19/2016 CLINICAL DATA:  Sickle cell pain, onset last night. EXAM: CT HEAD WITHOUT CONTRAST TECHNIQUE: Contiguous axial images were obtained from the base of the skull through the vertex without intravenous contrast. COMPARISON:  08/07/2016 FINDINGS: Brain: There is no intracranial hemorrhage, mass or evidence of acute infarction. There is no extra-axial fluid collection. Gray matter and white matter appear normal. Cerebral volume is normal for age. Brainstem and posterior fossa are unremarkable. The CSF spaces appear normal. Vascular: No hyperdense vessel or unexpected calcification. Skull: Normal. Negative for fracture or focal lesion. Sinuses/Orbits: No acute finding. Other: None. IMPRESSION: Normal brain Electronically Signed   By: Andreas Newport M.D.   On: 10/19/2016 20:01    Microbiology: No results found for this or any previous visit (from the past 240 hour(s)).   Labs: Basic Metabolic Panel:  Recent Labs Lab 10/19/16 1708 10/20/16 0807 10/21/16 0528  NA 139  --  136  K 5.3*  --  3.3*  CL 109  --  105  CO2 22  --  24  GLUCOSE 104*  --  92  BUN 15  --  16  CREATININE 0.55  --  0.74  CALCIUM 9.1  --  8.2*  MG  --  1.5* 1.7  PHOS  --  3.8  --    Liver Function Tests:  Recent Labs Lab 10/19/16 1708  AST 72*  ALT 77*  ALKPHOS 66  BILITOT 6.3*  PROT 8.2*  ALBUMIN 4.4   No results for input(s): LIPASE, AMYLASE in the last 168 hours. No results  for input(s): AMMONIA in the last 168 hours. CBC:  Recent Labs Lab 10/19/16 1803 10/21/16 0528  WBC 21.2* 13.7*  NEUTROABS 17.6* 10.2*  HGB 9.4* 7.6*  HCT 25.2* 20.2*  MCV 95.8 97.1  PLT 502* 478*   Cardiac Enzymes:  Recent Labs Lab 10/20/16 0140 10/20/16 0807  TROPONINI <0.03 <0.03   BNP: Invalid input(s): POCBNP CBG: No results for input(s): GLUCAP in the last 168 hours.  Time coordinating discharge: 50 minutes  Signed:  Jaxsyn Catalfamo, [facility] 10/21/2016, 11:35 AM

## 2016-12-12 ENCOUNTER — Encounter (HOSPITAL_COMMUNITY): Payer: Self-pay

## 2016-12-12 ENCOUNTER — Emergency Department (HOSPITAL_COMMUNITY)
Admission: EM | Admit: 2016-12-12 | Discharge: 2016-12-12 | Disposition: A | Discharge status: Home / Self Care | Payer: Medicare Other | Attending: Emergency Medicine | Admitting: Emergency Medicine

## 2016-12-12 DIAGNOSIS — Z79899 Other long term (current) drug therapy: Secondary | ICD-10-CM | POA: Insufficient documentation

## 2016-12-12 DIAGNOSIS — D5700 Hb-SS disease with crisis, unspecified: Secondary | ICD-10-CM | POA: Diagnosis present

## 2016-12-12 DIAGNOSIS — F17210 Nicotine dependence, cigarettes, uncomplicated: Secondary | ICD-10-CM | POA: Insufficient documentation

## 2016-12-12 DIAGNOSIS — G894 Chronic pain syndrome: Secondary | ICD-10-CM

## 2016-12-12 DIAGNOSIS — J45909 Unspecified asthma, uncomplicated: Secondary | ICD-10-CM | POA: Diagnosis not present

## 2016-12-12 LAB — COMPREHENSIVE METABOLIC PANEL
ALBUMIN: 4.4 g/dL (ref 3.5–5.0)
ALT: 32 U/L (ref 14–54)
AST: 31 U/L (ref 15–41)
Alkaline Phosphatase: 54 U/L (ref 38–126)
Anion gap: 9 (ref 5–15)
BILIRUBIN TOTAL: 5.9 mg/dL — AB (ref 0.3–1.2)
BUN: 10 mg/dL (ref 6–20)
CHLORIDE: 107 mmol/L (ref 101–111)
CO2: 23 mmol/L (ref 22–32)
Calcium: 9.4 mg/dL (ref 8.9–10.3)
Creatinine, Ser: 0.83 mg/dL (ref 0.44–1.00)
GFR calc Af Amer: >60 mL/min (ref >60)
GFR calc non Af Amer: >60 mL/min (ref >60)
GLUCOSE: 111 mg/dL — AB (ref 65–99)
POTASSIUM: 3.9 mmol/L (ref 3.5–5.1)
Sodium: 139 mmol/L (ref 135–145)
TOTAL PROTEIN: 7.5 g/dL (ref 6.5–8.1)

## 2016-12-12 LAB — CBC WITH DIFFERENTIAL/PLATELET
Basophils Absolute: 0 10*3/uL (ref 0.0–0.1)
Basophils Relative: 0 %
EOS PCT: 1 %
Eosinophils Absolute: 0.2 10*3/uL (ref 0.0–0.7)
HEMATOCRIT: 24.7 % — AB (ref 36.0–46.0)
HEMOGLOBIN: 9 g/dL — AB (ref 12.0–15.0)
Lymphocytes Relative: 37 %
Lymphs Abs: 5.9 10*3/uL — ABNORMAL HIGH (ref 0.7–4.0)
MCH: 34.9 pg — ABNORMAL HIGH (ref 26.0–34.0)
MCHC: 36.4 g/dL — ABNORMAL HIGH (ref 30.0–36.0)
MCV: 95.7 fL (ref 78.0–100.0)
MONOS PCT: 9 %
Monocytes Absolute: 1.4 10*3/uL — ABNORMAL HIGH (ref 0.1–1.0)
NEUTROS PCT: 53 %
Neutro Abs: 8.5 10*3/uL — ABNORMAL HIGH (ref 1.7–7.7)
Platelets: 436 10*3/uL — ABNORMAL HIGH (ref 150–400)
RBC: 2.58 MIL/uL — AB (ref 3.87–5.11)
RDW: 19.8 % — ABNORMAL HIGH (ref 11.5–15.5)
WBC: 16 10*3/uL — ABNORMAL HIGH (ref 4.0–10.5)

## 2016-12-12 LAB — RETICULOCYTES
RBC.: 2.58 MIL/uL — ABNORMAL LOW (ref 3.87–5.11)
Retic Count, Absolute: 505.7 10*3/uL — ABNORMAL HIGH (ref 19.0–186.0)
Retic Ct Pct: 19.6 % — ABNORMAL HIGH (ref 0.4–3.1)

## 2016-12-12 MED ORDER — HYDROMORPHONE HCL 2 MG/ML IJ SOLN: 2.0000 mg | INTRAMUSCULAR | Status: AC

## 2016-12-12 MED ORDER — HYDROMORPHONE HCL 2 MG/ML IJ SOLN: 2.0000 mg | INTRAMUSCULAR | Status: DC

## 2016-12-12 NOTE — ED Provider Notes (Signed)
[facility] DEPT Provider Note   CSN: [id number] Arrival date & time: 12/12/16  0755     History   Chief Complaint Chief Complaint  Patient presents with  . Sickle Cell Pain Crisis    HPI [name] is a 21 y.o. female.  Patient presents to the emergency department with chief complaint of sickle cell pain. She states that she began noticing pain in her bilateral ankles yesterday and last night. It has progressively worsened until now. She states that she does not take anything for pain at home. She does not know who follows her for sickle cell pain. She denies any fevers, chills, nausea, vomiting, or diarrhea. Denies any chest pain, or shortness breath. Her symptoms are worsened with movement and palpation. There are no other associated symptoms or modifying factors.   The history is provided by the patient. No language interpreter was used.    Past Medical History:  Diagnosis Date  . Acute kidney injury ([place]) 05/15/2016  . Amenorrhea   . Anemia    SICKLE CELL  . Asthma   . Drug-induced pruritus 02/26/2015  . GERD (gastroesophageal reflux disease)   . Headache   . Hyperbilirubinemia 02/26/2015  . Sickle cell disease [facility])     Patient Active Problem List   Diagnosis Date Noted  . Protein-calorie malnutrition, severe 10/21/2016  . Sepsis ([place])   . Anemia due to other cause   . Acute kidney injury ([place]) 05/15/2016  . Hypotension 05/15/2016  . Malnutrition of moderate degree 05/15/2016  . Gram-negative sepsis with organ dysfunction ([place])   . Acute pyelonephritis   . Hb-SS disease without crisis ([place])   . Leucocytosis 05/14/2016  . Pyelonephritis 05/14/2016  . Imprisonment and other incarceration   . Bibasilar crackles   . Hereditary hemolytic anemia ([place])   . Acute sickle cell crisis ([place]) 08/11/2015  . 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 Suicide ideation 03/11/2015  . Psychomotor agitation 03/11/2015  . Sickle cell anemia  with pain ([place]) 03/10/2015  . Vasoocclusive sickle cell crisis ([place])   . Hyperbilirubinemia 02/26/2015  . Drug-induced pruritus 02/26/2015  . Sickle cell anemia with crisis ([place]) 02/25/2015  . Tobacco abuse   . Sickle cell crisis ([place])   . Leukocytosis   . Hematochezia 02/27/2014  . UTI (lower urinary tract infection) 02/26/2014  . Vaginal discharge 02/24/2014  . Sickle cell pain crisis ([place]) 11/23/2013  . Facial rash 03/25/2013  . Birth control counseling 08/14/2012  . Asthma, mild intermittent 12/14/2010  . Hb-SS disease with crisis ([place]) 10/12/2009    Past Surgical History:  Procedure Laterality Date  . 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 TONSILLECTOMY     Age 61    OB History    No data available       Home Medications    Prior to Admission medications   Medication Sig Start Date End Date Taking? Authorizing Provider  folic acid (FOLVITE) 1 MG tablet Take 1 tablet (1 mg total) by mouth daily. Patient not taking: Reported on 08/07/2016 03/11/15   Vivi Barrack, MD  hydrocortisone 1 % lotion Apply 1 application topically 2 (two) times daily. Patient not taking: Reported on 08/07/2016 12/01/14   Alvina Chou, PA-C  naproxen (NAPROSYN) 375 MG tablet Take 1 tablet (375 mg total) by mouth 2 (two) times daily. Patient not taking: Reported on 10/19/2016 08/07/16   Margarita Mail, PA-C    Family History Family History  Problem  Relation Age of Onset  . Hypertension Paternal Grandfather   . Sickle cell trait Father   . Cancer Mother     Died in 02/02/2009    Social History Social History  Substance Use Topics  . Smoking status: Current Every Day Smoker    Packs/day: 0.50    Types: Cigarettes  . Smokeless tobacco: Never Used  . Alcohol use No     Comment: Denies 06/12/2014     Allergies   Patient has no known allergies.   Review of Systems Review of Systems  Constitutional: Negative for fever.  Respiratory: Negative for cough and shortness of breath.     Cardiovascular: Negative for chest pain.  Musculoskeletal: Positive for arthralgias.  All other systems reviewed and are negative.    Physical Exam Updated Vital Signs BP 113/72 (BP Location: Right Arm)   Pulse 100   Temp 98 F (36.7 C) (Oral)   Resp 18   Ht 5\' 4"  (1.626 m)   Wt 53.5 kg   SpO2 100%   BMI 20.25 kg/m   Physical Exam  Constitutional: She is oriented to person, place, and time. She appears well-developed and well-nourished.  HENT:  Head: Normocephalic and atraumatic.  Eyes: Conjunctivae and EOM are normal. Pupils are equal, round, and reactive to light.  Neck: Normal range of motion. Neck supple.  Cardiovascular: Normal rate and regular rhythm.  Exam reveals no gallop and no friction rub.   No murmur heard. Pulmonary/Chest: Effort normal and breath sounds normal. No respiratory distress. She has no wheezes. She has no rales. She exhibits no tenderness.  Abdominal: Soft. Bowel sounds are normal. She exhibits no distension and no mass. There is no tenderness. There is no rebound and no guarding.  Musculoskeletal: Normal range of motion. She exhibits no edema or tenderness.  No bony abnormality or deformity  Neurological: She is alert and oriented to person, place, and time.  Skin: Skin is warm and dry.  No erythema, abscess, cellulitis, or rash  Psychiatric: She has a normal mood and affect. Her behavior is normal. Judgment and thought content normal.  Nursing note and vitals reviewed.    ED Treatments / Results  Labs (all labs ordered are listed, but only abnormal results are displayed) Labs Reviewed - No data to display  EKG  EKG Interpretation None       Radiology No results found.  Procedures Procedures (including critical care time)  Medications Ordered in ED Medications - No data to display   Initial Impression / Assessment and Plan / ED Course  I have reviewed the triage vital signs and the nursing notes.  Pertinent labs & imaging  results that were available during my care of the patient were reviewed by me and considered in my medical decision making (see chart for details).     Patient presents with sickle cell versus chronic pain syndrome. She is afebrile. She complains pain in bilateral ankles. She is able to ambulate without difficulty. She has no evidence of rash, cellulitis, or abscess about the bilateral ankles. She has strong bilateral pulses, her feet are warm. Laboratory workup is reassuring. On reassessment, patient is resting comfortably. Plan for discharge to home. Recommend follow-up with sickle cell clinic and primary care provider.  Final Clinical Impressions(s) / ED Diagnoses   Final diagnoses:  Chronic pain syndrome    New Prescriptions New Prescriptions   No medications on file     Montine Circle, PA-C 12/12/16 [place],  MD 12/13/16 [id number]

## 2016-12-12 NOTE — ED Notes (Signed)
Dilaudid not administered at this time due to patient unable to stay awake. Pt appears to be resting comfortably and is in no apparent distress.

## 2016-12-12 NOTE — ED Triage Notes (Signed)
Patient complains of sickle cell crisis pain in bilateral legs x 1 day. Alert and oriented, playing on phone during triage assessment.

## 2016-12-12 NOTE — ED Notes (Signed)
EDP at bedside  

## 2016-12-12 NOTE — ED Notes (Signed)
Pt ambulatory without difficulty. 

## 2016-12-21 ENCOUNTER — Inpatient Hospital Stay (HOSPITAL_COMMUNITY)
Admission: EM | Admit: 2016-12-21 | Discharge: 2016-12-23 | DRG: 812 | Disposition: A | Discharge status: Home / Self Care | Payer: Medicare Other | Attending: Internal Medicine | Admitting: Internal Medicine

## 2016-12-21 ENCOUNTER — Emergency Department (HOSPITAL_COMMUNITY): Payer: Medicare Other

## 2016-12-21 ENCOUNTER — Encounter (HOSPITAL_COMMUNITY): Payer: Self-pay | Admitting: Emergency Medicine

## 2016-12-21 DIAGNOSIS — Z720 Tobacco use: Secondary | ICD-10-CM | POA: Diagnosis not present

## 2016-12-21 DIAGNOSIS — J4520 Mild intermittent asthma, uncomplicated: Secondary | ICD-10-CM | POA: Diagnosis present

## 2016-12-21 DIAGNOSIS — K5900 Constipation, unspecified: Secondary | ICD-10-CM | POA: Diagnosis present

## 2016-12-21 DIAGNOSIS — R509 Fever, unspecified: Secondary | ICD-10-CM | POA: Diagnosis not present

## 2016-12-21 DIAGNOSIS — Z9081 Acquired absence of spleen: Secondary | ICD-10-CM

## 2016-12-21 DIAGNOSIS — J029 Acute pharyngitis, unspecified: Secondary | ICD-10-CM

## 2016-12-21 DIAGNOSIS — Z8249 Family history of ischemic heart disease and other diseases of the circulatory system: Secondary | ICD-10-CM | POA: Diagnosis not present

## 2016-12-21 DIAGNOSIS — K219 Gastro-esophageal reflux disease without esophagitis: Secondary | ICD-10-CM | POA: Diagnosis present

## 2016-12-21 DIAGNOSIS — R5081 Fever presenting with conditions classified elsewhere: Secondary | ICD-10-CM | POA: Diagnosis present

## 2016-12-21 DIAGNOSIS — N390 Urinary tract infection, site not specified: Secondary | ICD-10-CM | POA: Diagnosis present

## 2016-12-21 DIAGNOSIS — R000 Tachycardia, unspecified: Secondary | ICD-10-CM | POA: Diagnosis present

## 2016-12-21 DIAGNOSIS — D5700 Hb-SS disease with crisis, unspecified: Principal | ICD-10-CM | POA: Diagnosis present

## 2016-12-21 DIAGNOSIS — R0600 Dyspnea, unspecified: Secondary | ICD-10-CM | POA: Diagnosis present

## 2016-12-21 DIAGNOSIS — F17200 Nicotine dependence, unspecified, uncomplicated: Secondary | ICD-10-CM | POA: Diagnosis present

## 2016-12-21 DIAGNOSIS — F17210 Nicotine dependence, cigarettes, uncomplicated: Secondary | ICD-10-CM | POA: Diagnosis present

## 2016-12-21 DIAGNOSIS — J020 Streptococcal pharyngitis: Secondary | ICD-10-CM | POA: Diagnosis present

## 2016-12-21 LAB — CBC WITH DIFFERENTIAL/PLATELET
BASOS ABS: 0 10*3/uL (ref 0.0–0.1)
Basophils Relative: 0 %
EOS ABS: 0.2 10*3/uL (ref 0.0–0.7)
Eosinophils Relative: 1 %
HEMATOCRIT: 22.8 % — AB (ref 36.0–46.0)
Hemoglobin: 8.1 g/dL — ABNORMAL LOW (ref 12.0–15.0)
LYMPHS ABS: 3.9 10*3/uL (ref 0.7–4.0)
LYMPHS PCT: 17 %
MCH: 34.5 pg — ABNORMAL HIGH (ref 26.0–34.0)
MCHC: 35.5 g/dL (ref 30.0–36.0)
MCV: 97 fL (ref 78.0–100.0)
MONOS PCT: 7 %
Monocytes Absolute: 1.6 10*3/uL — ABNORMAL HIGH (ref 0.1–1.0)
Neutro Abs: 17.2 10*3/uL — ABNORMAL HIGH (ref 1.7–7.7)
Neutrophils Relative %: 75 %
Platelets: 518 10*3/uL — ABNORMAL HIGH (ref 150–400)
RBC: 2.35 MIL/uL — AB (ref 3.87–5.11)
RDW: 17.1 % — AB (ref 11.5–15.5)
WBC: 22.9 10*3/uL — AB (ref 4.0–10.5)

## 2016-12-21 LAB — RETICULOCYTES
RBC.: 2.35 MIL/uL — AB (ref 3.87–5.11)
RETIC COUNT ABSOLUTE: 331.4 10*3/uL — AB (ref 19.0–186.0)
Retic Ct Pct: 14.1 % — ABNORMAL HIGH (ref 0.4–3.1)

## 2016-12-21 LAB — COMPREHENSIVE METABOLIC PANEL
ALBUMIN: 3.7 g/dL (ref 3.5–5.0)
ALT: 25 U/L (ref 14–54)
ANION GAP: 8 (ref 5–15)
AST: 29 U/L (ref 15–41)
Alkaline Phosphatase: 75 U/L (ref 38–126)
BILIRUBIN TOTAL: 6.6 mg/dL — AB (ref 0.3–1.2)
BUN: 12 mg/dL (ref 6–20)
CHLORIDE: 105 mmol/L (ref 101–111)
CO2: 24 mmol/L (ref 22–32)
Calcium: 8.8 mg/dL — ABNORMAL LOW (ref 8.9–10.3)
Creatinine, Ser: 1.05 mg/dL — ABNORMAL HIGH (ref 0.44–1.00)
GFR calc Af Amer: >60 mL/min (ref >60)
GFR calc non Af Amer: >60 mL/min (ref >60)
GLUCOSE: 126 mg/dL — AB (ref 65–99)
POTASSIUM: 3.6 mmol/L (ref 3.5–5.1)
SODIUM: 137 mmol/L (ref 135–145)
TOTAL PROTEIN: 6.7 g/dL (ref 6.5–8.1)

## 2016-12-21 LAB — RAPID STREP SCREEN (MED CTR MEBANE ONLY): Streptococcus, Group A Screen (Direct): POSITIVE — AB

## 2016-12-21 LAB — MONONUCLEOSIS SCREEN: Mono Screen: NEGATIVE

## 2016-12-21 LAB — I-STAT CG4 LACTIC ACID, ED: LACTIC ACID, VENOUS: 1.22 mmol/L (ref 0.5–1.9)

## 2016-12-21 LAB — INFLUENZA PANEL BY PCR (TYPE A & B)
INFLBPCR: NEGATIVE
Influenza A By PCR: NEGATIVE

## 2016-12-21 LAB — I-STAT TROPONIN, ED: Troponin i, poc: 0.02 ng/mL (ref 0.00–0.08)

## 2016-12-21 MED ORDER — HYDROMORPHONE HCL 2 MG/ML IJ SOLN
0.5000 mg | INTRAMUSCULAR | Status: AC
Start: 2016-12-21 — End: 2016-12-21

## 2016-12-21 MED ORDER — HYDROMORPHONE HCL 2 MG/ML IJ SOLN
0.5000 mg | INTRAMUSCULAR | Status: AC
Start: 2016-12-21 — End: 2016-12-21
  Administered 2016-12-21: 1 mg via INTRAVENOUS
  Filled 2016-12-21: qty 1

## 2016-12-21 MED ORDER — KETOROLAC TROMETHAMINE 30 MG/ML IJ SOLN
30.0000 mg | Freq: Once | INTRAMUSCULAR | Status: AC
  Administered 2016-12-21: 30 mg via INTRAVENOUS
  Filled 2016-12-21: qty 1

## 2016-12-21 MED ORDER — SODIUM CHLORIDE 0.9 % IV BOLUS (SEPSIS)
1000.0000 mL | Freq: Once | INTRAVENOUS | Status: AC
  Administered 2016-12-21: 1000 mL via INTRAVENOUS

## 2016-12-21 MED ORDER — PENICILLIN G BENZATHINE 1200000 UNIT/2ML IM SUSP
1.2000 10*6.[IU] | Freq: Once | INTRAMUSCULAR | Status: AC
  Administered 2016-12-21: 1.2 10*6.[IU] via INTRAMUSCULAR
  Filled 2016-12-21: qty 2

## 2016-12-21 MED ORDER — HYDROMORPHONE HCL 2 MG/ML IJ SOLN
1.0000 mg | Freq: Once | INTRAMUSCULAR | Status: AC
  Administered 2016-12-21: 1 mg via INTRAVENOUS
  Filled 2016-12-21: qty 1

## 2016-12-21 MED ORDER — ACETAMINOPHEN 325 MG PO TABS
650.0000 mg | ORAL_TABLET | Freq: Once | ORAL | Status: AC
Start: 2016-12-21 — End: 2016-12-21
  Administered 2016-12-21: 650 mg via ORAL
  Filled 2016-12-21: qty 2

## 2016-12-21 MED ORDER — SODIUM CHLORIDE 0.45 % IV SOLN
INTRAVENOUS | Status: DC
  Administered 2016-12-21: 19:00:00 via INTRAVENOUS

## 2016-12-21 NOTE — ED Notes (Signed)
Pt ambulatory to restroom with steady gait. Pt given graham crackers and juice.

## 2016-12-21 NOTE — ED Provider Notes (Signed)
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 Patient also complaining of a sore throat. She was noted to be febrile as well as tachycardic. She has not been hypotensive. Patient does have a leukocytosis today and meets sepsis criteria. Sepsis secondary to strep pharyngitis.  Patient continuing to have pain despite Dilaudid and Toradol. She is opiate nave. Given fever and persistent pain, will admit for further monitoring and pain management. Case discussed with Dr. Olevia Bowens of [facility]; plan for admission to [facility].   Results for orders placed or performed during the hospital encounter of 12/21/16  Rapid strep screen  Result Value Ref Range   Streptococcus, Group A Screen (Direct) POSITIVE (A) NEGATIVE  Mononucleosis screen  Result Value Ref Range   Mono Screen NEGATIVE NEGATIVE  Influenza panel by PCR (type A & B)  Result Value Ref Range   Influenza A By PCR NEGATIVE NEGATIVE   Influenza B By PCR NEGATIVE NEGATIVE  Reticulocytes  Result Value Ref Range   Retic Ct Pct 14.1 (H) 0.4 - 3.1 %   RBC. 2.35 (L) 3.87 - 5.11 MIL/uL   Retic Count, Manual 331.4 (H) 19.0 - 186.0 K/uL  CBC WITH DIFFERENTIAL  Result Value Ref Range   WBC 22.9 (H) 4.0 - 10.5 K/uL   RBC 2.35 (L) 3.87 - 5.11 MIL/uL   Hemoglobin 8.1 (L) 12.0 - 15.0 g/dL   HCT 22.8 (L) 36.0 - 46.0 %   MCV 97.0 78.0 - 100.0 fL   MCH 34.5 (H) 26.0 - 34.0 pg   MCHC 35.5 30.0 - 36.0 g/dL   RDW 17.1 (H) 11.5 - 15.5 %   Platelets 518 (H) 150 - 400 K/uL   Neutrophils Relative % 75 %   Lymphocytes Relative 17 %   Monocytes Relative 7 %   Eosinophils Relative 1 %   Basophils Relative 0 %   Neutro Abs 17.2 (H) 1.7 - 7.7 K/uL   Lymphs Abs 3.9 0.7 - 4.0 K/uL   Monocytes Absolute 1.6 (H) 0.1 - 1.0 K/uL   Eosinophils Absolute 0.2 0.0 - 0.7 K/uL   Basophils Absolute 0.0 0.0 - 0.1 K/uL   RBC Morphology POLYCHROMASIA PRESENT    Comprehensive metabolic panel  Result Value Ref Range   Sodium 137 135 - 145 mmol/L   Potassium 3.6 3.5 - 5.1 mmol/L   Chloride 105 101 - 111 mmol/L   CO2 24 22 - 32 mmol/L   Glucose, Bld 126 (H) 65 - 99 mg/dL   BUN 12 6 - 20 mg/dL   Creatinine, Ser 1.05 (H) 0.44 - 1.00 mg/dL   Calcium 8.8 (L) 8.9 - 10.3 mg/dL   Total Protein 6.7 6.5 - 8.1 g/dL   Albumin 3.7 3.5 - 5.0 g/dL   AST 29 15 - 41 U/L   ALT 25 14 - 54 U/L   Alkaline Phosphatase 75 38 - 126 U/L   Total Bilirubin 6.6 (H) 0.3 - 1.2 mg/dL   GFR calc non Af Amer >60 >60 mL/min   GFR calc Af Amer >60 >60 mL/min   Anion gap 8 5 - 15  I-Stat Troponin, ED (not at [facility])  Result Value Ref Range   Troponin i, poc 0.02 0.00 - 0.08 ng/mL   Comment 3          I-Stat CG4 Lactic Acid, ED  Result Value Ref Range   Lactic Acid, Venous 1.22 0.5 -  1.9 mmol/L   Dg Chest 2 View  Result Date: 12/21/2016 CLINICAL DATA:  c/o sob, wheezing and neck pain, ems reports pt feels like she is having a sickle cell crisis, pt seen last week for same. Given 5mg  albuterol pta. Vss. EXAM: CHEST - 2 VIEW COMPARISON: : COMPARISON: 10/19/2016 FINDINGS: Lungs are clear. Heart size upper limits normal. No effusion.  No pneumothorax. Visualized bones unremarkable. IMPRESSION: 1. Borderline cardiomegaly.  No acute disease. Electronically Signed   By: Lucrezia Europe M.D.   On: 12/21/2016 19:38      Antonietta Breach, PA-C 12/21/16 [place], MD 12/30/16 [phone]

## 2016-12-21 NOTE — ED Notes (Signed)
Pt called out multiple times requesting graham crackers and juice, pt made aware that the doctor needs to see her before she eats or drinks.

## 2016-12-21 NOTE — ED Notes (Signed)
Pt pulling off leads and BP cuff.

## 2016-12-21 NOTE — ED Notes (Signed)
Patient transported to X-ray 

## 2016-12-21 NOTE — ED Triage Notes (Signed)
Pt arrives via [facility] for c/o sob, wheezing and neck pain, ems reports pt feels like she is having a sickle cell crisis, pt seen last week for same. Given 5mg  albuterol pta. Vss.

## 2016-12-21 NOTE — ED Notes (Signed)
Pt returned from X-ray.  

## 2016-12-21 NOTE — ED Notes (Signed)
Upon entering the room, patient found to be sleeping soundly. Vss, resp e/u.

## 2016-12-21 NOTE — ED Provider Notes (Signed)
[facility] DEPT Provider Note   CSN: [id number] Arrival date & time: 12/21/16  1632     History   Chief Complaint Chief Complaint  Patient presents with  . Shortness of Breath  . Sickle Cell Pain Crisis    HPI [name] is a 21 y.o. female with pertinent pmh of sickle cell disease, splenectomy, tonsillectomy, mild intermittent asthma, GERD presents to ED with sore throat, fever, anterior neck swelling and tender lymph nodes, shortness of breath and bilateral arm pain x 2 days.  Patient states typical sickle cell pain is localized to UE and LE, bilateral arm pain is typical of her sickle cell pain. Patient denies chest pain, palpitations, cough, hemoptysis, abdominal pain, n/v/d/c, urinary symptoms, no known sick contacts with similar symptoms of sore throat/fever.  No headache, visual changes, posterior neck pain, photophobia or phonophobia.  Patient states she only take folic acid at home, but does not take any medications for sickle cell disease including hydroxyurea or opioids at home.  Does not have a sickle cell provider.  Has not been to PCP in "long time".  Last sickle cell pain crisis on 12/12/16 in which patient was treated with dilaudid 2 mg x 2 and discharged home. Last sickle cell pain crisis admission on 10/19/16.   HPI  Past Medical History:  Diagnosis Date  . Acute kidney injury ([place]) 05/15/2016  . Amenorrhea   . Anemia    SICKLE CELL  . Asthma   . Drug-induced pruritus 02/26/2015  . GERD (gastroesophageal reflux disease)   . Headache   . Hyperbilirubinemia 02/26/2015  . Sickle cell disease [facility])     Patient Active Problem List   Diagnosis Date Noted  . Protein-calorie malnutrition, severe 10/21/2016  . Sepsis ([place])   . Anemia due to other cause   . Acute kidney injury ([place]) 05/15/2016  . Hypotension 05/15/2016  . Malnutrition of moderate degree 05/15/2016  . Gram-negative sepsis with organ dysfunction ([place])   . Acute pyelonephritis   . Hb-SS  disease without crisis ([place])   . Leucocytosis 05/14/2016  . Pyelonephritis 05/14/2016  . Imprisonment and other incarceration   . Bibasilar crackles   . Hereditary hemolytic anemia ([place])   . Acute sickle cell crisis ([place]) 08/11/2015  . 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 Suicide ideation 03/11/2015  . Psychomotor agitation 03/11/2015  . Sickle cell anemia with pain ([place]) 03/10/2015  . Vasoocclusive sickle cell crisis ([place])   . Hyperbilirubinemia 02/26/2015  . Drug-induced pruritus 02/26/2015  . Sickle cell anemia with crisis ([place]) 02/25/2015  . Tobacco abuse   . Sickle cell crisis ([place])   . Leukocytosis   . Hematochezia 02/27/2014  . UTI (lower urinary tract infection) 02/26/2014  . Vaginal discharge 02/24/2014  . Sickle cell pain crisis ([place]) 11/23/2013  . Facial rash 03/25/2013  . Birth control counseling 08/14/2012  . Asthma, mild intermittent 12/14/2010  . Hb-SS disease with crisis ([place]) 10/12/2009    Past Surgical History:  Procedure Laterality Date  . SPLENECTOMY     Age 34 for sequestration  . TONSILLECTOMY     Age 32    OB History    No data available       Home Medications    Prior to Admission medications   Medication Sig Start Date End Date Taking? Authorizing Provider  folic acid (FOLVITE) 1 MG tablet Take 1 tablet (1 mg total) by mouth daily. Patient not taking: Reported on 08/07/2016 03/11/15   Hetty Blend  Burnetta Sabin, MD  hydrocortisone 1 % lotion Apply 1 application topically 2 (two) times daily. Patient not taking: Reported on 08/07/2016 12/01/14   Alvina Chou, PA-C  naproxen (NAPROSYN) 375 MG tablet Take 1 tablet (375 mg total) by mouth 2 (two) times daily. Patient not taking: Reported on 10/19/2016 08/07/16   Margarita Mail, PA-C    Family History Family History  Problem Relation Age of Onset  . Hypertension Paternal Grandfather   . Sickle cell trait Father   . Cancer Mother     Died in 2009-01-12    Social History Social  History  Substance Use Topics  . Smoking status: Current Every Day Smoker    Packs/day: 0.50    Types: Cigarettes  . Smokeless tobacco: Never Used  . Alcohol use No     Comment: Denies 06/12/2014     Allergies   Patient has no known allergies.   Review of Systems Review of Systems  Constitutional: Positive for fever. Negative for appetite change and chills.  HENT: Positive for sore throat. Negative for congestion, rhinorrhea and voice change.   Eyes: Negative for visual disturbance.  Respiratory: Positive for shortness of breath. Negative for cough, choking and chest tightness.   Cardiovascular: Negative for chest pain, palpitations and leg swelling.  Gastrointestinal: Negative for abdominal pain, constipation, diarrhea, nausea and vomiting.  Genitourinary: Negative for difficulty urinating, dysuria, flank pain, hematuria and pelvic pain.  Musculoskeletal: Positive for arthralgias (bilateral arms) and neck pain (anterior neck/lymph node pain and swelling). Negative for joint swelling.  Skin: Negative for rash and wound.  Neurological: Negative for dizziness, seizures, syncope, weakness, light-headedness, numbness and headaches.  Hematological: Does not bruise/bleed easily.  Psychiatric/Behavioral: Negative.      Physical Exam Updated Vital Signs BP 120/76   Pulse 120   Temp 100.8 F (38.2 C) (Rectal)   Resp 24   Ht '5\' 4"'  (1.626 m)   Wt 53.5 kg   LMP 12/21/2016 (Exact Date)   SpO2 98%   BMI 20.25 kg/m   Physical Exam  Constitutional: She is oriented to person, place, and time. She appears well-developed and well-nourished. She is sleeping and cooperative.  Non-toxic appearance. She does not appear ill.  Patient is warm to touch  HENT:  Head: Normocephalic and atraumatic.  Nose: Nose normal.  Mouth/Throat: Mucous membranes are normal. Posterior oropharyngeal edema and posterior oropharyngeal erythema present.  Oropharynx is mildly erythematous and edematous.  Eyes:  Conjunctivae and EOM are normal. Pupils are equal, round, and reactive to light. No scleral icterus.  Neck: Normal range of motion and full passive range of motion without pain. Neck supple. No JVD present. No Brudzinski's sign and no Kernig's sign noted.  Cardiovascular: Regular rhythm, normal heart sounds and intact distal pulses.  Tachycardia present.   No murmur heard. Pulmonary/Chest: Effort normal and breath sounds normal. No respiratory distress. She has no wheezes. She has no rales. She exhibits no tenderness.  Abdominal: Soft. Bowel sounds are normal. She exhibits no distension and no mass. There is no tenderness. There is no rebound and no guarding.  Musculoskeletal: Normal range of motion. She exhibits no deformity.  Lymphadenopathy:    She has cervical adenopathy.  Bilateral anterior cervical adenopathy, tender. Bilateral posterior cervical adenopathy, tender.   Neurological: She is alert and oriented to person, place, and time. No sensory deficit.  Skin: Skin is warm and dry. Capillary refill takes less than 2 seconds.  Psychiatric: She has a normal mood and affect. Her behavior is  normal. Judgment and thought content normal.  Nursing note and vitals reviewed.    ED Treatments / Results  Labs (all labs ordered are listed, but only abnormal results are displayed) Labs Reviewed  RAPID STREP SCREEN (NOT AT [facility]) - Abnormal; Notable for the following:       Result Value   Streptococcus, Group A Screen (Direct) POSITIVE (*)    All other components within normal limits  RETICULOCYTES - Abnormal; Notable for the following:    Retic Ct Pct 14.1 (*)    RBC. 2.35 (*)    Retic Count, Manual 331.4 (*)    All other components within normal limits  CBC WITH DIFFERENTIAL/PLATELET - Abnormal; Notable for the following:    WBC 22.9 (*)    RBC 2.35 (*)    Hemoglobin 8.1 (*)    HCT 22.8 (*)    MCH 34.5 (*)    RDW 17.1 (*)    Platelets 518 (*)    All other components within normal  limits  CULTURE, BLOOD (ROUTINE X 2)  CULTURE, BLOOD (ROUTINE X 2)  MONONUCLEOSIS SCREEN  INFLUENZA PANEL BY PCR (TYPE A & B)  COMPREHENSIVE METABOLIC PANEL  URINALYSIS, ROUTINE W REFLEX MICROSCOPIC  I-STAT TROPOININ, ED  I-STAT CG4 LACTIC ACID, ED    EKG  EKG Interpretation None       Radiology Dg Chest 2 View  Result Date: 12/21/2016 CLINICAL DATA:  c/o sob, wheezing and neck pain, ems reports pt feels like she is having a sickle cell crisis, pt seen last week for same. Given 71m albuterol pta. Vss. EXAM: CHEST - 2 VIEW COMPARISON: : COMPARISON: 10/19/2016 FINDINGS: Lungs are clear. Heart size upper limits normal. No effusion.  No pneumothorax. Visualized bones unremarkable. IMPRESSION: 1. Borderline cardiomegaly.  No acute disease. Electronically Signed   By: DLucrezia EuropeM.D.   On: 12/21/2016 19:38    Procedures Procedures (including critical care time)  Medications Ordered in ED Medications  0.45 % sodium chloride infusion ( Intravenous New Bag/Given 12/21/16 1849)  penicillin g benzathine (BICILLIN LA) 1200000 UNIT/2ML injection 1.2 Million Units (not administered)  sodium chloride 0.9 % bolus 1,000 mL (not administered)  HYDROmorphone (DILAUDID) injection 0.5-1 mg (1 mg Intravenous Given 12/21/16 1850)    Or  HYDROmorphone (DILAUDID) injection 0.5-1 mg ( Subcutaneous See Alternative 12/21/16 1850)  acetaminophen (TYLENOL) tablet 650 mg (650 mg Oral Given 12/21/16 2000)  ketorolac (TORADOL) 30 MG/ML injection 30 mg (30 mg Intravenous Given 12/21/16 2044)  HYDROmorphone (DILAUDID) injection 1 mg (1 mg Intravenous Given 12/21/16 2044)     Initial Impression / Assessment and Plan / ED Course  I have reviewed the triage vital signs and the nursing notes.  Pertinent labs & imaging results that were available during my care of the patient were reviewed by me and considered in my medical decision making (see chart for details).  Clinical Course as of Dec 21 2116  Thu Dec 21, 2016  1914 Afebrile, rectal temp Temp: 100.8 F (38.2 C) [CG]  1930 tachycardic Pulse Rate: 105 [CG]  1930 No tachypnea Resp: 18 [CG]  1941 IMPRESSION: 1. Borderline cardiomegaly. No acute disease DG Chest 2 View [CG]  1941 No ischemic changes, probable LVH EKG 12-Lead [CG]  1954 Re-evaluated patient. States dilaudid x 1 has not helped her bilateral arm pain, still 10/10.  Pt requested cold water and t[place]uwaitsandwich.  [CG]  2106 Streptococcus, Group A Screen (Direct): (!) POSITIVE [CG]  2106 Lactic Acid, Venous: 1.22 [CG]  Clinical Course User Index [CG] Kinnie Feil, PA-C    21 yo female with pertinent pmh of sickle cell disease, splenectomy, tonsillectomy, mild intermittent asthma presents to ED with sore throat, fever, anterior lymph nodes swelling and tenderness x 2 days.  Patient also reports bilateral bilateral arm pain (typical of sickle cell pain) x 2 days.  On exam patient is febrile, tachycardic and borderline tachypnic.  Non toxic appearing.  Speaking in full sentences, requests [place] sandwich, graham crackers and cold water.  Lungs clear to ausculation without signs of consolidation.  Posterior oropharynx erythematous and edematous, oral airway is widely patents.  Uvula midline, no trismus, full neck ROM, normal phonation.  Abdomen benign.    SIRS criteria met with rectal fever, tachycardia, borderline tachypnea. +Strep.  CXR without acute cardiopulmonary disease. Non ischemic EKG. Patient has been tolerating PO fluids and foods in ED. Pt given dilaudid 20m x 2 and toradol in ED with minimal relief of sickle cell bilateral arm pain.   Pending influenza test, rapid mono, cbc, reticulocytes, cmp, U/A, blood cultures x2.  [facility] labs.  Anticipate admission given SIRS + source of infection in sickle cell patient. Patient does not have reliable f/u, does not have PCP or sickle cell provider to f/u with.  Final Clinical  Impressions(s) / ED Diagnoses   Final diagnoses:  Sickle cell pain crisis ([place]  Sore throat  Fever, unspecified fever cause    New Prescriptions New Prescriptions   No medications on file     CArlean Hopping02/22/18 2118    MCharlesetta Shanks MD 12/30/16 [phone]

## 2016-12-21 NOTE — H&P (Signed)
History and Physical    [name] DOB: 07-19-1996 DOA: 12/21/2016  PCP: Dimas Chyle, MD   Patient coming from: Home  Chief Complaint: Dyspnea, wheezing and neck pain.  HPI: [name] is a 21 y.o. female with medical history significant of amenorrhea, sickle cell anemia, asthma, GERD, headaches, sickle cell crisis in this hyperbilirubinemia is coming to the emergency department with complaints of dyspnea, wheezing, sore throat and neck pain for the past 2 days. She denies chest pain, palpitations, dizziness, diaphoresis, The patient states that her symptoms are similar to her sickle cell crisis symptoms. She denies abdominal pain, nausea, emesis, diarrhea, constipation, melena or hematochezia. She complains of mild dysuria and frequency. She is not on narcotics at home.  ED Course: The patient received bronchodilators, IV fluids and supplemental oxygen. She states she feels better. Urinalysis showed bacteria and 6-30 WBC. Her WBC was 22.9, hemoglobin 8.1, platelets 518 and reticulocyte count was 14.Marland Kitchen Her CMP showed glucose of 126 and bilirubin of 6.6, all the other values are within normal limits. Her lactic acid was normal, troponin was normal, Influenza by PCR was negative, mono test was negative and strep A was positive. Her chest radiograph showed cardiomegaly, but no acute cardiopulmonary pathology.  Review of Systems: As per HPI otherwise 10 point review of systems negative.    Past Medical History:  Diagnosis Date  . Acute kidney injury ([place]) 05/15/2016  . Amenorrhea   . Anemia    SICKLE CELL  . Asthma   . Drug-induced pruritus 02/26/2015  . GERD (gastroesophageal reflux disease)   . Headache   . Hyperbilirubinemia 02/26/2015  . Sickle cell disease ([place])     Past Surgical History:  Procedure Laterality Date  . 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 TONSILLECTOMY     Age 1     reports that she has been smoking Cigarettes.  She has  been smoking about 0.50 packs per day. She has never used smokeless tobacco. She reports that she uses drugs, including Marijuana. She reports that she does not drink alcohol.  No Known Allergies  Family History  Problem Relation Age of Onset  . Hypertension Paternal Grandfather   . Sickle cell trait Father   . Cancer Mother     Died in 2009-02-18    Prior to Admission medications   Medication Sig Start Date End Date Taking? Authorizing Provider  folic acid (FOLVITE) 1 MG tablet Take 1 tablet (1 mg total) by mouth daily. Patient not taking: Reported on 08/07/2016 03/11/15   Vivi Barrack, MD  hydrocortisone 1 % lotion Apply 1 application topically 2 (two) times daily. Patient not taking: Reported on 08/07/2016 12/01/14   Alvina Chou, PA-C  naproxen (NAPROSYN) 375 MG tablet Take 1 tablet (375 mg total) by mouth 2 (two) times daily. Patient not taking: Reported on 10/19/2016 08/07/16   Margarita Mail, PA-C    Physical Exam:  Constitutional: Looks acutely ill, febrile. Vitals:   12/21/16 [birth year] 12/21/16 2254 12/21/16 2300 12/21/16 2316  BP: (!) 104/53 143/99 (!) 105/50 (!) 99/53  Pulse: 82 80 82 71  Resp: 18   16  Temp:    98.2 F (36.8 C)  TempSrc:      SpO2: 94% 93% 96% 98%  Weight:      Height:       Eyes: PERRL, lids and conjunctivae normal ENMT: Mucous membranes are moist. Posterior pharynx Shows erythema and edema, but no exudate. Neck: normal,  supple, positive adenopathies bilaterally, no thyromegaly Respiratory: clear to auscultation bilaterally, no wheezing, no crackles. Normal respiratory effort. No accessory muscle use.  Cardiovascular: Tachycardic at 107 BPM, no murmurs / rubs / gallops. No extremity edema. 2+ pedal pulses. No carotid bruits.  Abdomen: Soft, no tenderness, no masses palpated. No hepatosplenomegaly. Bowel sounds positive.  Musculoskeletal: no clubbing / cyanosis.  Good ROM, no contractures. Normal muscle tone.  Skin: no rashes, lesions, ulcers on limited  skin exam. Neurologic: CN 2-12 grossly intact. Sensation intact, DTR normal. Strength 5/5 in all 4.  Psychiatric: Normal judgment and insight. Alert and oriented x 4.    Labs on Admission: I have personally reviewed following labs and imaging studies  CBC:  Recent Labs Lab 12/21/16 2045  WBC 22.9*  NEUTROABS 17.2*  HGB 8.1*  HCT 22.8*  MCV 97.0  PLT 0000000*   Basic Metabolic Panel:  Recent Labs Lab 12/21/16 2045  NA 137  K 3.6  CL 105  CO2 24  GLUCOSE 126*  BUN 12  CREATININE 1.05*  CALCIUM 8.8*   GFR: Estimated Creatinine Clearance: 71.6 mL/min (by C-G formula based on SCr of 1.05 mg/dL (H)). Liver Function Tests:  Recent Labs Lab 12/21/16 2045  AST 29  ALT 25  ALKPHOS 75  BILITOT 6.6*  PROT 6.7  ALBUMIN 3.7   No results for input(s): LIPASE, AMYLASE in the last 168 hours. No results for input(s): AMMONIA in the last 168 hours. Coagulation Profile: No results for input(s): INR, PROTIME in the last 168 hours. Cardiac Enzymes: No results for input(s): CKTOTAL, CKMB, CKMBINDEX, TROPONINI in the last 168 hours. BNP (last 3 results) No results for input(s): PROBNP in the last 8760 hours. HbA1C: No results for input(s): HGBA1C in the last 72 hours. CBG: No results for input(s): GLUCAP in the last 168 hours. Lipid Profile: No results for input(s): CHOL, HDL, LDLCALC, TRIG, CHOLHDL, LDLDIRECT in the last 72 hours. Thyroid Function Tests: No results for input(s): TSH, T4TOTAL, FREET4, T3FREE, THYROIDAB in the last 72 hours. Anemia Panel:  Recent Labs  12/21/16 2045  RETICCTPCT 14.1*   Urine analysis:    Component Value Date/Time   COLORURINE YELLOW 10/19/2016 [place] 10/19/2016 1840   LABSPEC 1.008 10/19/2016 1840   PHURINE 6.0 10/19/2016 1840   GLUCOSEU NEGATIVE 10/19/2016 1840   HGBUR SMALL (A) 10/19/2016 1840   BILIRUBINUR NEGATIVE 10/19/2016 1840   KETONESUR NEGATIVE 10/19/2016 1840   PROTEINUR NEGATIVE 10/19/2016 1840    UROBILINOGEN 1.0 08/11/2015 1948   NITRITE NEGATIVE 10/19/2016 1840   LEUKOCYTESUR NEGATIVE 10/19/2016 1840    Radiological Exams on Admission: Dg Chest 2 View  Result Date: 12/21/2016 CLINICAL DATA:  c/o sob, wheezing and neck pain, ems reports pt feels like she is having a sickle cell crisis, pt seen last week for same. Given 5mg  albuterol pta. Vss. EXAM: CHEST - 2 VIEW COMPARISON: : COMPARISON: 10/19/2016 FINDINGS: Lungs are clear. Heart size upper limits normal. No effusion.  No pneumothorax. Visualized bones unremarkable. IMPRESSION: 1. Borderline cardiomegaly.  No acute disease. Electronically Signed   By: Lucrezia Europe M.D.   On: 12/21/2016 19:38    EKG: Independently reviewed. Vent. rate 95 BPM PR interval * ms QRS duration 85 ms QT/QTc 315/396 ms P-R-T axes 73 64 250 Sinus rhythm Probable left atrial enlargement Probable LVH with secondary repol abnrm  Assessment/Plan Principal Problem:   Sickle cell crisis ([place]) Admit to telemetry. Continue supplemental oxygen. Continue IV hydration. Monitor reticulocyte count, hematocrit and  hemoglobin. Continue folic acid supplementation. Consider starting hydroxyurea.  Active Problems:   UTI (urinary tract infection) Rocephin 1 g IVPB every 24 hours. Follow-up urine culture and sensitivity.    Strep throat Continue Rocephin 1 g IVPB every 24 hours.    Asthma, mild intermittent Supplemental oxygen as needed. Bronchodilators as needed.    Tobacco abuse Nicotine replacement therapy offered. Tobacco cessation will be provided.    Hyperbilirubinemia Secondary to hemolysis.    Sinus tachycardia Much better after IV fluids and antipyretics..   Monitor heart rate.    DVT prophylaxis: Lovenox SQ. Code Status: Full code. Family Communication:  Disposition Plan: Admit for pain control, IV hydration and IV antibiotics. Consults called: Will be assigned to hematology service in a.m. Admission status:  Inpatient/telemetry.   Reubin Milan MD [facility] Pager [phone].  If 7PM-7AM, please contact night-coverage [email or web address] Password [facility]  12/21/2016, 11:30 PM

## 2016-12-21 NOTE — ED Notes (Signed)
Phlebotomy at bedside drawing labs.

## 2016-12-22 ENCOUNTER — Encounter (HOSPITAL_COMMUNITY): Payer: Self-pay | Admitting: *Deleted

## 2016-12-22 DIAGNOSIS — R509 Fever, unspecified: Secondary | ICD-10-CM

## 2016-12-22 DIAGNOSIS — R000 Tachycardia, unspecified: Secondary | ICD-10-CM

## 2016-12-22 DIAGNOSIS — Z720 Tobacco use: Secondary | ICD-10-CM

## 2016-12-22 LAB — CBC WITH DIFFERENTIAL/PLATELET
Basophils Absolute: 0.2 10*3/uL — ABNORMAL HIGH (ref 0.0–0.1)
Basophils Relative: 1 %
EOS PCT: 1 %
Eosinophils Absolute: 0.2 10*3/uL (ref 0.0–0.7)
HEMATOCRIT: 18.8 % — AB (ref 36.0–46.0)
Hemoglobin: 6.9 g/dL — CL (ref 12.0–15.0)
LYMPHS ABS: 3.9 10*3/uL (ref 0.7–4.0)
Lymphocytes Relative: 24 %
MCH: 34.8 pg — ABNORMAL HIGH (ref 26.0–34.0)
MCHC: 36.7 g/dL — AB (ref 30.0–36.0)
MCV: 94.9 fL (ref 78.0–100.0)
MONO ABS: 1.5 10*3/uL — AB (ref 0.1–1.0)
MONOS PCT: 9 %
NEUTROS ABS: 10.6 10*3/uL — AB (ref 1.7–7.7)
Neutrophils Relative %: 65 %
PLATELETS: 428 10*3/uL — AB (ref 150–400)
RBC: 1.98 MIL/uL — AB (ref 3.87–5.11)
RDW: 17.6 % — AB (ref 11.5–15.5)
WBC: 16.4 10*3/uL — AB (ref 4.0–10.5)

## 2016-12-22 LAB — URINALYSIS, ROUTINE W REFLEX MICROSCOPIC
BILIRUBIN URINE: NEGATIVE
Bacteria, UA: NONE SEEN
GLUCOSE, UA: NEGATIVE mg/dL
Ketones, ur: NEGATIVE mg/dL
NITRITE: NEGATIVE
PH: 5 (ref 5.0–8.0)
Protein, ur: NEGATIVE mg/dL
Specific Gravity, Urine: 1.01 (ref 1.005–1.030)

## 2016-12-22 LAB — COMPREHENSIVE METABOLIC PANEL
ALT: 40 U/L (ref 14–54)
ANION GAP: 7 (ref 5–15)
AST: 47 U/L — ABNORMAL HIGH (ref 15–41)
Albumin: 3.4 g/dL — ABNORMAL LOW (ref 3.5–5.0)
Alkaline Phosphatase: 62 U/L (ref 38–126)
BILIRUBIN TOTAL: 5.7 mg/dL — AB (ref 0.3–1.2)
BUN: 11 mg/dL (ref 6–20)
CHLORIDE: 106 mmol/L (ref 101–111)
CO2: 24 mmol/L (ref 22–32)
Calcium: 7.9 mg/dL — ABNORMAL LOW (ref 8.9–10.3)
Creatinine, Ser: 0.93 mg/dL (ref 0.44–1.00)
Glucose, Bld: 111 mg/dL — ABNORMAL HIGH (ref 65–99)
Potassium: 3.6 mmol/L (ref 3.5–5.1)
Sodium: 137 mmol/L (ref 135–145)
TOTAL PROTEIN: 6.2 g/dL — AB (ref 6.5–8.1)

## 2016-12-22 LAB — MAGNESIUM: Magnesium: 2.1 mg/dL (ref 1.7–2.4)

## 2016-12-22 MED ORDER — ONDANSETRON HCL 4 MG PO TABS: 4.0000 mg | ORAL_TABLET | ORAL | Status: DC | PRN

## 2016-12-22 MED ORDER — IPRATROPIUM-ALBUTEROL 0.5-2.5 (3) MG/3ML IN SOLN: 3.0000 mL | RESPIRATORY_TRACT | Status: DC | PRN

## 2016-12-22 MED ORDER — HYDROXYUREA 500 MG PO CAPS: 500.0000 mg | ORAL_CAPSULE | Freq: Two times a day (BID) | ORAL | Status: DC

## 2016-12-22 MED ORDER — DIPHENHYDRAMINE HCL 50 MG/ML IJ SOLN: 12.5000 mg | Freq: Four times a day (QID) | INTRAMUSCULAR | Status: DC | PRN

## 2016-12-22 MED ORDER — BISACODYL 10 MG RE SUPP
10.0000 mg | Freq: Once | RECTAL | Status: AC
  Administered 2016-12-22: 10 mg via RECTAL
  Filled 2016-12-22: qty 1

## 2016-12-22 MED ORDER — POLYETHYLENE GLYCOL 3350 17 G PO PACK
17.0000 g | PACK | Freq: Every day | ORAL | Status: DC | PRN
  Administered 2016-12-22: 17 g via ORAL
  Filled 2016-12-22: qty 1

## 2016-12-22 MED ORDER — SODIUM CHLORIDE 0.9% FLUSH: 9.0000 mL | INTRAVENOUS | Status: DC | PRN

## 2016-12-22 MED ORDER — SENNOSIDES-DOCUSATE SODIUM 8.6-50 MG PO TABS
1.0000 | ORAL_TABLET | Freq: Two times a day (BID) | ORAL | Status: DC
  Administered 2016-12-22 – 2016-12-23 (×4): 1 via ORAL
  Filled 2016-12-22 (×4): qty 1

## 2016-12-22 MED ORDER — ONDANSETRON HCL 4 MG/2ML IJ SOLN: 4.0000 mg | INTRAMUSCULAR | Status: DC | PRN

## 2016-12-22 MED ORDER — DEXTROSE 5 % IV SOLN
1.0000 g | Freq: Every day | INTRAVENOUS | Status: DC
  Administered 2016-12-22 (×2): 1 g via INTRAVENOUS
  Filled 2016-12-22 (×2): qty 10

## 2016-12-22 MED ORDER — POTASSIUM CHLORIDE IN NACL 20-0.45 MEQ/L-% IV SOLN
INTRAVENOUS | Status: DC
  Administered 2016-12-22 (×2): via INTRAVENOUS
  Filled 2016-12-22 (×3): qty 1000

## 2016-12-22 MED ORDER — NALOXONE HCL 0.4 MG/ML IJ SOLN: 0.4000 mg | INTRAMUSCULAR | Status: DC | PRN

## 2016-12-22 MED ORDER — SODIUM CHLORIDE 0.45 % IV SOLN
INTRAVENOUS | Status: DC
  Administered 2016-12-22: 02:00:00 via INTRAVENOUS

## 2016-12-22 MED ORDER — DIPHENHYDRAMINE HCL 12.5 MG/5ML PO ELIX: 12.5000 mg | ORAL_SOLUTION | Freq: Four times a day (QID) | ORAL | Status: DC | PRN

## 2016-12-22 MED ORDER — OXYCODONE HCL 5 MG PO TABS: 5.0000 mg | ORAL_TABLET | ORAL | Status: DC | PRN

## 2016-12-22 MED ORDER — ONDANSETRON HCL 4 MG/2ML IJ SOLN
4.0000 mg | Freq: Four times a day (QID) | INTRAMUSCULAR | Status: DC | PRN
  Administered 2016-12-23: 4 mg via INTRAVENOUS
  Filled 2016-12-22: qty 2

## 2016-12-22 MED ORDER — KETOROLAC TROMETHAMINE 30 MG/ML IJ SOLN
30.0000 mg | Freq: Four times a day (QID) | INTRAMUSCULAR | Status: DC
  Administered 2016-12-22 – 2016-12-23 (×6): 30 mg via INTRAVENOUS
  Filled 2016-12-22 (×6): qty 1

## 2016-12-22 MED ORDER — ACETAMINOPHEN 325 MG PO TABS
650.0000 mg | ORAL_TABLET | Freq: Once | ORAL | Status: AC
  Administered 2016-12-22: 650 mg via ORAL
  Filled 2016-12-22: qty 2

## 2016-12-22 MED ORDER — ENOXAPARIN SODIUM 40 MG/0.4ML ~~LOC~~ SOLN
40.0000 mg | Freq: Every day | SUBCUTANEOUS | Status: DC
  Administered 2016-12-22 – 2016-12-23 (×2): 40 mg via SUBCUTANEOUS
  Filled 2016-12-22 (×2): qty 0.4

## 2016-12-22 MED ORDER — HYDROMORPHONE HCL 1 MG/ML IJ SOLN: 1.0000 mg | Freq: Once | INTRAMUSCULAR | Status: DC

## 2016-12-22 MED ORDER — DEXTROSE-NACL 5-0.45 % IV SOLN
INTRAVENOUS | Status: DC
  Administered 2016-12-22 – 2016-12-23 (×2): via INTRAVENOUS

## 2016-12-22 MED ORDER — SODIUM CHLORIDE 0.9% FLUSH
3.0000 mL | Freq: Two times a day (BID) | INTRAVENOUS | Status: DC
  Administered 2016-12-22: 3 mL via INTRAVENOUS

## 2016-12-22 MED ORDER — HYDROMORPHONE 1 MG/ML IV SOLN
INTRAVENOUS | Status: DC
  Administered 2016-12-22: 0.6 mg via INTRAVENOUS
  Administered 2016-12-22: 0.9 mg via INTRAVENOUS
  Administered 2016-12-22: 02:00:00 via INTRAVENOUS
  Administered 2016-12-22: 0.2 mg via INTRAVENOUS
  Administered 2016-12-22 (×2): 1.8 mg via INTRAVENOUS
  Administered 2016-12-23: 1 mg via INTRAVENOUS
  Administered 2016-12-23: 0.8 mg via INTRAVENOUS
  Administered 2016-12-23: 0.4 mg via INTRAVENOUS
  Administered 2016-12-23: 1.6 mg via INTRAVENOUS
  Filled 2016-12-22: qty 25

## 2016-12-22 MED ORDER — NICOTINE 14 MG/24HR TD PT24: 14.0000 mg | MEDICATED_PATCH | Freq: Every day | TRANSDERMAL | Status: DC | PRN

## 2016-12-22 MED ORDER — HYDROMORPHONE HCL 1 MG/ML IJ SOLN
0.5000 mg | Freq: Once | INTRAMUSCULAR | Status: AC
  Administered 2016-12-22: 0.5 mg via INTRAVENOUS
  Filled 2016-12-22: qty 0.5

## 2016-12-22 NOTE — Progress Notes (Signed)
Patient ID: [name], female   DOB: [birth year], 21 y.o.   MRN: [id number] Subjective:  [name] is a 21 y.o. female with pertinent pmh of sickle cell disease, splenectomy, tonsillectomy, mild intermittent asthma, GERD presents to ED with sore throat, fever, anterior neck swelling and tender lymph nodes, shortness of breath and bilateral arm pain x 2 days. The patient received bronchodilators, IV fluids and supplemental oxygen. She states she feels better. Urinalysis showed bacteria and 6-30 WBC. Her WBC was 22.9, hemoglobin 8.1, platelets 518 and reticulocyte count was 14.Marland Kitchen Her CMP showed glucose of 126 and bilirubin of 6.6, all the other values are within normal limits. Her lactic acid was normal, troponin was normal, Influenza by PCR was negative, mono test was negative and strep A was positive. Her chest radiograph showed cardiomegaly, but no acute cardiopulmonary pathology. She feels much better today, no new complaint except for constipation (not unusual). No fever, no chest pain, no SOB.   Objective:  Vital signs in last 24 hours:  Vitals:   12/22/16 0809 12/22/16 1139 12/22/16 1603 12/22/16 1610  BP: (!) 94/52   (!) 103/56  Pulse:    68  Resp:  16 16 16   Temp: 97.5 F (36.4 C)   98 F (36.7 C)  TempSrc: Axillary   Oral  SpO2: 100% 99% 100% 97%  Weight:      Height:        Intake/Output from previous day:   Intake/Output Summary (Last 24 hours) at 12/22/16 1847 Last data filed at 12/22/16 1612  Gross per 24 hour  Intake          1722.67 ml  Output             1450 ml  Net           272.67 ml    Physical Exam: General: Alert, awake, oriented x3, in no acute distress.  HEENT: [place]/AT PEERL, EOMI Neck: Trachea midline,  no masses, no thyromegal,y no JVD, no carotid bruit OROPHARYNX:  Moist, No exudate/ erythema/lesions.  Heart: Regular rate and rhythm, without murmurs, rubs, gallops, PMI non-displaced, no heaves or thrills on palpation.  Lungs: Clear  to auscultation, no wheezing or rhonchi noted. No increased vocal fremitus resonant to percussion  Abdomen: Soft, nontender, nondistended, positive bowel sounds, no masses no hepatosplenomegaly noted..  Neuro: No focal neurological deficits noted cranial nerves II through XII grossly intact. DTRs 2+ bilaterally upper and lower extremities. Strength 5 out of 5 in bilateral upper and lower extremities. Musculoskeletal: No warm swelling or erythema around joints, no spinal tenderness noted. Psychiatric: Patient alert and oriented x3, good insight and cognition, good recent to remote recall. Lymph node survey: No cervical axillary or inguinal lymphadenopathy noted.  Lab Results:  Basic Metabolic Panel:    Component Value Date/Time   NA 137 12/22/2016 0508   K 3.6 12/22/2016 0508   CL 106 12/22/2016 0508   CO2 24 12/22/2016 0508   BUN 11 12/22/2016 0508   CREATININE 0.93 12/22/2016 0508   GLUCOSE 111 (H) 12/22/2016 0508   CALCIUM 7.9 (L) 12/22/2016 0508   CBC:    Component Value Date/Time   WBC 16.4 (H) 12/22/2016 0508   HGB 6.9 (LL) 12/22/2016 0508   HCT 18.8 (L) 12/22/2016 0508   PLT 428 (H) 12/22/2016 0508   MCV 94.9 12/22/2016 0508   NEUTROABS 10.6 (H) 12/22/2016 0508   LYMPHSABS 3.9 12/22/2016 0508   MONOABS 1.5 (H) 12/22/2016 0508   EOSABS  0.2 12/22/2016 0508   BASOSABS 0.2 (H) 12/22/2016 0508    Recent Results (from the past 240 hour(s))  Rapid strep screen     Status: Abnormal   Collection Time: 12/21/16  6:52 PM  Result Value Ref Range Status   Streptococcus, Group A Screen (Direct) POSITIVE (A) NEGATIVE Final  Blood culture (routine x 2)     Status: None (Preliminary result)   Collection Time: 12/21/16  8:15 PM  Result Value Ref Range Status   Specimen Description BLOOD LEFT ARM  Final   Special Requests BOTTLES DRAWN AEROBIC AND ANAEROBIC 5CC  Final   Culture NO GROWTH < 24 HOURS  Final   Report Status PENDING  Incomplete  Blood culture (routine x 2)     Status:  None (Preliminary result)   Collection Time: 12/21/16  8:30 PM  Result Value Ref Range Status   Specimen Description BLOOD LEFT WRIST  Final   Special Requests IN PEDIATRIC BOTTLE 3CC  Final   Culture NO GROWTH < 24 HOURS  Final   Report Status PENDING  Incomplete    Studies/Results: Dg Chest 2 View  Result Date: 12/21/2016 CLINICAL DATA:  c/o sob, wheezing and neck pain, ems reports pt feels like she is having a sickle cell crisis, pt seen last week for same. Given 5mg  albuterol pta. Vss. EXAM: CHEST - 2 VIEW COMPARISON: : COMPARISON: 10/19/2016 FINDINGS: Lungs are clear. Heart size upper limits normal. No effusion.  No pneumothorax. Visualized bones unremarkable. IMPRESSION: 1. Borderline cardiomegaly.  No acute disease. Electronically Signed   By: Lucrezia Europe M.D.   On: 12/21/2016 19:38    Medications: Scheduled Meds: . bisacodyl  10 mg Rectal Once  . cefTRIAXone (ROCEPHIN)  IV  1 g Intravenous QHS  . enoxaparin (LOVENOX) injection  40 mg Subcutaneous Daily  . HYDROmorphone   Intravenous Q4H  . ketorolac  30 mg Intravenous Q6H  . senna-docusate  1 tablet Oral BID  . sodium chloride flush  3 mL Intravenous Q12H   Continuous Infusions: . 0.45 % NaCl with KCl 20 mEq / L 125 mL/hr at 12/22/16 1304   PRN Meds:.diphenhydrAMINE **OR** diphenhydrAMINE, ipratropium-albuterol, naloxone **AND** sodium chloride flush, nicotine, ondansetron (ZOFRAN) IV, polyethylene glycol  Consultants:  None  Procedures:  None  Antibiotics:  IV Rocephine  Assessment/Plan: Principal Problem:   Sickle cell crisis (H[facility]) Active Problems:   Asthma, mild intermittent   UTI (urinary tract infection)   Tobacco abuse   Hyperbilirubinemia   Sinus tachycardia   Strep throat  1. Sickle cell crisis [facility]): Continue current pain regimen, appears to be working. Continue supplemental oxygen. Continue IV hydration, Continue folic acid supplementation. Discontinue Hydrea, may start after crisis is resolved  and patient discussed follow up and monitoring with PCP.  2. UTI (urinary tract infection): Continue Rocephin 1 g IVPB every 24 hours. Follow-up urine culture and sensitivity.  3. Strep throat: Continue Rocephin 1 g IVPB every 24 hours.  4. Tobacco abuse: [name] was counseled on the dangers of tobacco use, and was advised to quit. Reviewed strategies to maximize success, including removing cigarettes and smoking materials from environment, stress management and support of family/friends.  Code Status: Full Code Family Communication: N/A Disposition Plan: For possible discharge tomorrow  Samad Thon  If 7PM-7AM, please contact night-coverage.  12/22/2016, 6:47 PM  LOS: 1 day

## 2016-12-23 DIAGNOSIS — D5700 Hb-SS disease with crisis, unspecified: Principal | ICD-10-CM

## 2016-12-23 LAB — CBC WITH DIFFERENTIAL/PLATELET
BASOS ABS: 0.1 10*3/uL (ref 0.0–0.1)
Basophils Relative: 1 %
EOS PCT: 2 %
Eosinophils Absolute: 0.3 10*3/uL (ref 0.0–0.7)
HEMATOCRIT: 17.6 % — AB (ref 36.0–46.0)
HEMOGLOBIN: 6.4 g/dL — AB (ref 12.0–15.0)
LYMPHS PCT: 43 %
Lymphs Abs: 5.5 10*3/uL — ABNORMAL HIGH (ref 0.7–4.0)
MCH: 33.9 pg (ref 26.0–34.0)
MCHC: 36.4 g/dL — ABNORMAL HIGH (ref 30.0–36.0)
MCV: 93.1 fL (ref 78.0–100.0)
MONOS PCT: 9 %
Monocytes Absolute: 1.2 10*3/uL — ABNORMAL HIGH (ref 0.1–1.0)
NEUTROS ABS: 5.7 10*3/uL (ref 1.7–7.7)
Neutrophils Relative %: 45 %
Platelets: 476 10*3/uL — ABNORMAL HIGH (ref 150–400)
RBC: 1.89 MIL/uL — AB (ref 3.87–5.11)
RDW: 19.2 % — ABNORMAL HIGH (ref 11.5–15.5)
WBC: 12.8 10*3/uL — AB (ref 4.0–10.5)

## 2016-12-23 LAB — COMPREHENSIVE METABOLIC PANEL
ALBUMIN: 3.3 g/dL — AB (ref 3.5–5.0)
ALK PHOS: 67 U/L (ref 38–126)
ALT: 90 U/L — ABNORMAL HIGH (ref 14–54)
AST: 80 U/L — AB (ref 15–41)
Anion gap: 4 — ABNORMAL LOW (ref 5–15)
BILIRUBIN TOTAL: 3.9 mg/dL — AB (ref 0.3–1.2)
BUN: 13 mg/dL (ref 6–20)
CALCIUM: 8.3 mg/dL — AB (ref 8.9–10.3)
CO2: 26 mmol/L (ref 22–32)
CREATININE: 0.72 mg/dL (ref 0.44–1.00)
Chloride: 111 mmol/L (ref 101–111)
GFR calc Af Amer: >60 mL/min (ref >60)
GFR calc non Af Amer: >60 mL/min (ref >60)
GLUCOSE: 108 mg/dL — AB (ref 65–99)
Potassium: 4.3 mmol/L (ref 3.5–5.1)
Sodium: 141 mmol/L (ref 135–145)
TOTAL PROTEIN: 6.1 g/dL — AB (ref 6.5–8.1)

## 2016-12-23 MED ORDER — LEVOFLOXACIN 750 MG PO TABS: 750.0000 mg | ORAL_TABLET | Freq: Every day | ORAL | 0 refills | Status: AC

## 2016-12-23 NOTE — Progress Notes (Signed)
Pt very upset and teary because her family is unable to pick her up for discharge. Grandmother will not answer her calls and 2 aunts are unable to pick her up.  Still drowsy from PCA, but alert and oriented.  Cab voucher arranged for pt.

## 2016-12-23 NOTE — Discharge Summary (Signed)
Physician Discharge Summary  Patient ID: FARREN EDGELL MRN: [id number] DOB/AGE: 19-Feb-1996 21 y.o.  Admit date: 12/21/2016 Discharge date: 12/23/2016  Admission Diagnoses:  Discharge Diagnoses:  Principal Problem:   Sickle cell crisis ([place]) Active Problems:   Asthma, mild intermittent   UTI (urinary tract infection)   Tobacco abuse   Hyperbilirubinemia   Sinus tachycardia   Strep throat   Discharged Condition: good  Hospital Course: A 21 year old female with sickle cell disease admitted with sickle cell painful crisis, UTI acute asthma attack as well as strep throat. He had fever anterior neck swelling and tender lymph nodes. Patient was treated with IV Rocephin for both a UTI as well as her strep throat. She has improved tremendously. She feels much better at the moment. She is transitioned to oral Levaquin to complete treatment at home. She is relatively nave to opiates. Treated with IV Dilaudid and Toradol. Currently transitioned to oral medications. Her urine culture was negative with no growth. Patient will be discharged home to follow with PCP.  Consults: None  Significant Diagnostic Studies: labs: CBC was CMP as well as urine culture and sensitivities.  Treatments: IV hydration, antibiotics: Levaquin and ceftriaxone and analgesia: Dilaudid  Discharge Exam: Blood pressure (!) 106/57, pulse 64, temperature 98.7 F (37.1 C), temperature source Oral, resp. rate 14, height 5\' 4"  (1.626 m), weight 52.4 kg (115 lb 8 oz), last menstrual period 12/21/2016, SpO2 100 %. General appearance: alert, cooperative, appears stated age and no distress Back: symmetric, no curvature. ROM normal. No CVA tenderness. Resp: clear to auscultation bilaterally Chest wall: no tenderness Cardio: regular rate and rhythm, S1, S2 normal, no murmur, click, rub or gallop GI: soft, non-tender; bowel sounds normal; no masses,  no organomegaly Extremities: extremities normal, atraumatic, no cyanosis  or edema Pulses: 2+ and symmetric Skin: Skin color, texture, turgor normal. No rashes or lesions Neurologic: Grossly normal  Disposition: 01-Home or Self Care   Allergies as of 12/23/2016   No Known Allergies     Medication List    STOP taking these medications   hydrocortisone 1 % lotion     TAKE these medications   folic acid 1 MG tablet Commonly known as:  FOLVITE Take 1 tablet (1 mg total) by mouth daily.   levofloxacin 750 MG tablet Commonly known as:  LEVAQUIN Take 1 tablet (750 mg total) by mouth daily.   naproxen 375 MG tablet Commonly known as:  NAPROSYN Take 1 tablet (375 mg total) by mouth 2 (two) times daily.        SignedBarbette Merino 12/23/2016, 6:03 AM   Time spent 32 minutes

## 2016-12-26 LAB — CULTURE, BLOOD (ROUTINE X 2)
CULTURE: NO GROWTH
CULTURE: NO GROWTH

## 2017-01-19 ENCOUNTER — Telehealth: Payer: Self-pay | Admitting: *Deleted

## 2017-01-19 NOTE — Telephone Encounter (Signed)
Dr. Saralyn Pilar, Medical Director of Transfusion Services called to report findings regarding patient's transfusion last July (2017).  The donor of the transfusion tested negative for Hep B, however the donor was tested positive for Hep B March 2018.  Dr. Saralyn Pilar is requesting that patient should be tested for Hep B.  Please give him a call office: [phone] or cell: [phone].  Derl Barrow, RN

## 2017-01-19 NOTE — Telephone Encounter (Signed)
Attempted to call Dr Saralyn Pilar back on both office number and cell number.  No answer.  I will defer continued follow up on this matter to patient's PCP, Dr Jerline Pain.

## 2017-01-25 NOTE — Telephone Encounter (Signed)
LM for patient to call back.  Please schedule an appointment for her with PCP for an annual exam.  Lalania Haseman,CMA

## 2017-01-25 NOTE — Telephone Encounter (Signed)
Patient has not had a well visit in nearly 4 years. She is due for a pap smear. Will discuss this at that appointment.  Algis Greenhouse. Jerline Pain, [place] [facility] Resident PGY-3 01/25/2017 9:23 AM

## 2017-02-12 ENCOUNTER — Inpatient Hospital Stay (HOSPITAL_COMMUNITY)
Admission: EM | Admit: 2017-02-12 | Discharge: 2017-02-14 | DRG: 811 | Discharge status: Against Medical Advice | Payer: Medicare Other | Attending: Internal Medicine | Admitting: Internal Medicine

## 2017-02-12 ENCOUNTER — Emergency Department (HOSPITAL_COMMUNITY): Payer: Medicare Other

## 2017-02-12 ENCOUNTER — Encounter (HOSPITAL_COMMUNITY): Payer: Self-pay | Admitting: Emergency Medicine

## 2017-02-12 DIAGNOSIS — D72829 Elevated white blood cell count, unspecified: Secondary | ICD-10-CM | POA: Diagnosis not present

## 2017-02-12 DIAGNOSIS — Z5900 Homelessness unspecified: Secondary | ICD-10-CM

## 2017-02-12 DIAGNOSIS — Z720 Tobacco use: Secondary | ICD-10-CM | POA: Diagnosis present

## 2017-02-12 DIAGNOSIS — Z681 Body mass index (BMI) 19 or less, adult: Secondary | ICD-10-CM

## 2017-02-12 DIAGNOSIS — F17200 Nicotine dependence, unspecified, uncomplicated: Secondary | ICD-10-CM | POA: Diagnosis present

## 2017-02-12 DIAGNOSIS — D5700 Hb-SS disease with crisis, unspecified: Secondary | ICD-10-CM | POA: Diagnosis not present

## 2017-02-12 DIAGNOSIS — Z9081 Acquired absence of spleen: Secondary | ICD-10-CM

## 2017-02-12 DIAGNOSIS — Z9119 Patient's noncompliance with other medical treatment and regimen: Secondary | ICD-10-CM

## 2017-02-12 DIAGNOSIS — Z5321 Procedure and treatment not carried out due to patient leaving prior to being seen by health care provider: Secondary | ICD-10-CM | POA: Diagnosis not present

## 2017-02-12 DIAGNOSIS — D638 Anemia in other chronic diseases classified elsewhere: Secondary | ICD-10-CM | POA: Diagnosis present

## 2017-02-12 DIAGNOSIS — J45909 Unspecified asthma, uncomplicated: Secondary | ICD-10-CM | POA: Diagnosis present

## 2017-02-12 DIAGNOSIS — F17210 Nicotine dependence, cigarettes, uncomplicated: Secondary | ICD-10-CM | POA: Diagnosis present

## 2017-02-12 DIAGNOSIS — E43 Unspecified severe protein-calorie malnutrition: Secondary | ICD-10-CM | POA: Diagnosis present

## 2017-02-12 DIAGNOSIS — K219 Gastro-esophageal reflux disease without esophagitis: Secondary | ICD-10-CM | POA: Diagnosis present

## 2017-02-12 DIAGNOSIS — Z809 Family history of malignant neoplasm, unspecified: Secondary | ICD-10-CM

## 2017-02-12 LAB — COMPREHENSIVE METABOLIC PANEL
ALT: 14 U/L (ref 14–54)
AST: 21 U/L (ref 15–41)
Albumin: 4.2 g/dL (ref 3.5–5.0)
Alkaline Phosphatase: 52 U/L (ref 38–126)
Anion gap: 6 (ref 5–15)
BUN: 15 mg/dL (ref 6–20)
CHLORIDE: 107 mmol/L (ref 101–111)
CO2: 26 mmol/L (ref 22–32)
CREATININE: 0.82 mg/dL (ref 0.44–1.00)
Calcium: 8.9 mg/dL (ref 8.9–10.3)
Glucose, Bld: 108 mg/dL — ABNORMAL HIGH (ref 65–99)
Potassium: 4 mmol/L (ref 3.5–5.1)
Sodium: 139 mmol/L (ref 135–145)
Total Bilirubin: 7.4 mg/dL — ABNORMAL HIGH (ref 0.3–1.2)
Total Protein: 7.4 g/dL (ref 6.5–8.1)

## 2017-02-12 LAB — CBC WITH DIFFERENTIAL/PLATELET
BASOS ABS: 0 10*3/uL (ref 0.0–0.1)
Basophils Relative: 0 %
EOS ABS: 0.1 10*3/uL (ref 0.0–0.7)
Eosinophils Relative: 1 %
HCT: 22.7 % — ABNORMAL LOW (ref 36.0–46.0)
HEMOGLOBIN: 8.2 g/dL — AB (ref 12.0–15.0)
LYMPHS PCT: 42 %
Lymphs Abs: 5 10*3/uL — ABNORMAL HIGH (ref 0.7–4.0)
MCH: 34.9 pg — ABNORMAL HIGH (ref 26.0–34.0)
MCHC: 36.1 g/dL — AB (ref 30.0–36.0)
MCV: 96.6 fL (ref 78.0–100.0)
MONOS PCT: 12 %
Monocytes Absolute: 1.4 10*3/uL — ABNORMAL HIGH (ref 0.1–1.0)
Neutro Abs: 5.3 10*3/uL (ref 1.7–7.7)
Neutrophils Relative %: 45 %
Platelets: 500 10*3/uL — ABNORMAL HIGH (ref 150–400)
RBC: 2.35 MIL/uL — AB (ref 3.87–5.11)
RDW: 19.2 % — ABNORMAL HIGH (ref 11.5–15.5)
WBC: 11.8 10*3/uL — AB (ref 4.0–10.5)
nRBC: 4 /100 WBC — ABNORMAL HIGH

## 2017-02-12 LAB — URINALYSIS, ROUTINE W REFLEX MICROSCOPIC
BILIRUBIN URINE: NEGATIVE
Glucose, UA: NEGATIVE mg/dL
Hgb urine dipstick: NEGATIVE
KETONES UR: NEGATIVE mg/dL
Leukocytes, UA: NEGATIVE
NITRITE: NEGATIVE
Protein, ur: NEGATIVE mg/dL
SPECIFIC GRAVITY, URINE: 1.011 (ref 1.005–1.030)
pH: 7 (ref 5.0–8.0)

## 2017-02-12 LAB — POC URINE PREG, ED: Preg Test, Ur: NEGATIVE

## 2017-02-12 LAB — RETICULOCYTES
RBC.: 2.35 MIL/uL — ABNORMAL LOW (ref 3.87–5.11)
RETIC COUNT ABSOLUTE: 458.3 10*3/uL — AB (ref 19.0–186.0)
RETIC CT PCT: 19.5 % — AB (ref 0.4–3.1)

## 2017-02-12 LAB — PROTIME-INR
INR: 1.19
PROTHROMBIN TIME: 15.2 s (ref 11.4–15.2)

## 2017-02-12 LAB — I-STAT CG4 LACTIC ACID, ED: LACTIC ACID, VENOUS: 1.26 mmol/L (ref 0.5–1.9)

## 2017-02-12 LAB — LACTATE DEHYDROGENASE: LDH: 255 U/L — AB (ref 98–192)

## 2017-02-12 MED ORDER — ACETAMINOPHEN 325 MG PO TABS: 650.0000 mg | ORAL_TABLET | Freq: Four times a day (QID) | ORAL | Status: DC | PRN

## 2017-02-12 MED ORDER — HYDROMORPHONE HCL 2 MG/ML IJ SOLN
0.5000 mg | INTRAMUSCULAR | Status: AC
  Filled 2017-02-12: qty 1

## 2017-02-12 MED ORDER — MORPHINE SULFATE 2 MG/ML IV SOLN
INTRAVENOUS | Status: DC
  Administered 2017-02-12: via INTRAVENOUS
  Administered 2017-02-13: 4.5 mg via INTRAVENOUS
  Administered 2017-02-13: 6 mg via INTRAVENOUS
  Administered 2017-02-13 (×2): 0 mg via INTRAVENOUS
  Administered 2017-02-13: 1.5 mg via INTRAVENOUS
  Administered 2017-02-13: 6 mg via INTRAVENOUS
  Administered 2017-02-14: 9 mg via INTRAVENOUS
  Administered 2017-02-14: 0 mg via INTRAVENOUS
  Filled 2017-02-12: qty 30

## 2017-02-12 MED ORDER — HYDROMORPHONE HCL 2 MG/ML IJ SOLN: 0.5000 mg | INTRAMUSCULAR | Status: DC

## 2017-02-12 MED ORDER — HYDROMORPHONE HCL 2 MG/ML IJ SOLN
0.5000 mg | INTRAMUSCULAR | Status: DC
  Filled 2017-02-12: qty 1

## 2017-02-12 MED ORDER — ACETAMINOPHEN 650 MG RE SUPP: 650.0000 mg | Freq: Four times a day (QID) | RECTAL | Status: DC | PRN

## 2017-02-12 MED ORDER — ONDANSETRON HCL 4 MG PO TABS: 4.0000 mg | ORAL_TABLET | Freq: Four times a day (QID) | ORAL | Status: DC | PRN

## 2017-02-12 MED ORDER — ENOXAPARIN SODIUM 40 MG/0.4ML ~~LOC~~ SOLN
40.0000 mg | Freq: Every day | SUBCUTANEOUS | Status: DC
  Administered 2017-02-13: 40 mg via SUBCUTANEOUS
  Filled 2017-02-12: qty 0.4

## 2017-02-12 MED ORDER — HYDROMORPHONE HCL 2 MG/ML IJ SOLN
0.5000 mg | INTRAMUSCULAR | Status: AC
  Administered 2017-02-12: 1 mg via INTRAVENOUS
  Filled 2017-02-12: qty 1

## 2017-02-12 MED ORDER — HYDROMORPHONE HCL 2 MG/ML IJ SOLN: 0.5000 mg | INTRAMUSCULAR | Status: AC

## 2017-02-12 MED ORDER — KETOROLAC TROMETHAMINE 30 MG/ML IJ SOLN
30.0000 mg | Freq: Four times a day (QID) | INTRAMUSCULAR | Status: DC
  Administered 2017-02-13 – 2017-02-14 (×7): 30 mg via INTRAVENOUS
  Filled 2017-02-12 (×7): qty 1

## 2017-02-12 MED ORDER — DIPHENHYDRAMINE HCL 12.5 MG/5ML PO ELIX: 12.5000 mg | ORAL_SOLUTION | Freq: Four times a day (QID) | ORAL | Status: DC | PRN

## 2017-02-12 MED ORDER — SODIUM CHLORIDE 0.9% FLUSH
9.0000 mL | INTRAVENOUS | Status: DC | PRN
Start: 2017-02-12 — End: 2017-02-14

## 2017-02-12 MED ORDER — ONDANSETRON HCL 4 MG/2ML IJ SOLN: 4.0000 mg | Freq: Four times a day (QID) | INTRAMUSCULAR | Status: DC | PRN

## 2017-02-12 MED ORDER — SODIUM CHLORIDE 0.45 % IV SOLN
INTRAVENOUS | Status: DC
  Administered 2017-02-12 – 2017-02-13 (×2): via INTRAVENOUS

## 2017-02-12 MED ORDER — NICOTINE 14 MG/24HR TD PT24
14.0000 mg | MEDICATED_PATCH | Freq: Every day | TRANSDERMAL | Status: DC
  Administered 2017-02-13 – 2017-02-14 (×2): 14 mg via TRANSDERMAL
  Filled 2017-02-12 (×2): qty 1

## 2017-02-12 MED ORDER — ONDANSETRON HCL 4 MG/2ML IJ SOLN
4.0000 mg | INTRAMUSCULAR | Status: DC | PRN
  Administered 2017-02-12: 4 mg via INTRAVENOUS
  Filled 2017-02-12: qty 2

## 2017-02-12 MED ORDER — DIPHENHYDRAMINE HCL 50 MG/ML IJ SOLN
12.5000 mg | Freq: Four times a day (QID) | INTRAMUSCULAR | Status: DC | PRN
  Administered 2017-02-13: 12.5 mg via INTRAVENOUS
  Filled 2017-02-12: qty 1

## 2017-02-12 MED ORDER — HYDROMORPHONE HCL 2 MG/ML IJ SOLN
0.5000 mg | INTRAMUSCULAR | Status: AC
  Administered 2017-02-12: 1 mg via SUBCUTANEOUS

## 2017-02-12 MED ORDER — NALOXONE HCL 0.4 MG/ML IJ SOLN: 0.4000 mg | INTRAMUSCULAR | Status: DC | PRN

## 2017-02-12 NOTE — ED Provider Notes (Signed)
[facility] DEPT Provider Note   CSN: [id number] Arrival date & time: 02/12/17  1724     History   Chief Complaint Chief Complaint  Patient presents with  . Sickle Cell Pain Crisis    HPI [name] is a 21 y.o. female who presents emergency Department with chief complaint of sickle cell crisis. The patient complains of bilateral lower extremity pain. She states that her pain began this morning. Patient states that she has not been on any narcotics for the past week. She does not currently have a primary care provider. She states that last night she did run a fever up to 101, but denies any urinary symptoms, cough, chest pain, shortness of breath. The patient is noted to be icteric, but states that that is her baseline. She denies nausea or vomiting. She denies rashes or sores.  HPI  Past Medical History:  Diagnosis Date  . Acute kidney injury ([place]) 05/15/2016  . Amenorrhea   . Anemia    SICKLE CELL  . Asthma   . Drug-induced pruritus 02/26/2015  . GERD (gastroesophageal reflux disease)   . Headache   . Hyperbilirubinemia 02/26/2015  . Sickle cell disease [facility])     Patient Active Problem List   Diagnosis Date Noted  . Sinus tachycardia 12/21/2016  . Strep throat 12/21/2016  . Protein-calorie malnutrition, severe 10/21/2016  . Sepsis ([place])   . Anemia due to other cause   . Acute kidney injury ([place]) 05/15/2016  . Hypotension 05/15/2016  . Malnutrition of moderate degree 05/15/2016  . Gram-negative sepsis with organ dysfunction ([place])   . Acute pyelonephritis   . Hb-SS disease without crisis ([place])   . Leucocytosis 05/14/2016  . Pyelonephritis 05/14/2016  . Imprisonment and other incarceration   . Bibasilar crackles   . Hereditary hemolytic anemia ([place])   . Acute sickle cell crisis ([place]) 08/11/2015  . 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 Suicide ideation 03/11/2015  . Psychomotor agitation 03/11/2015  . Sickle cell anemia with  pain ([place]) 03/10/2015  . Vasoocclusive sickle cell crisis ([place])   . Hyperbilirubinemia 02/26/2015  . Drug-induced pruritus 02/26/2015  . Sickle cell anemia with crisis ([place]) 02/25/2015  . Tobacco abuse   . Sickle cell crisis ([place])   . Leukocytosis   . Hematochezia 02/27/2014  . UTI (urinary tract infection) 02/26/2014  . Vaginal discharge 02/24/2014  . Sickle cell pain crisis ([place]) 11/23/2013  . Facial rash 03/25/2013  . Birth control counseling 08/14/2012  . Fever 09/20/2011  . Asthma, mild intermittent 12/14/2010  . Hb-SS disease with crisis ([place]) 10/12/2009    Past Surgical History:  Procedure Laterality Date  . 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 TONSILLECTOMY     Age 47    OB History    No data available       Home Medications    Prior to Admission medications   Medication Sig Start Date End Date Taking? Authorizing Provider  folic acid (FOLVITE) 1 MG tablet Take 1 tablet (1 mg total) by mouth daily. Patient not taking: Reported on 08/07/2016 03/11/15   Vivi Barrack, MD  naproxen (NAPROSYN) 375 MG tablet Take 1 tablet (375 mg total) by mouth 2 (two) times daily. Patient not taking: Reported on 10/19/2016 08/07/16   Margarita Mail, PA-C    Family History Family History  Problem Relation Age of Onset  . Hypertension Paternal Grandfather   . Sickle cell trait Father   .  Cancer Mother     Died in 2010    Social History Social History  Substance Use Topics  . Smoking status: Current Every Day Smoker    Packs/day: 0.50    Types: Cigarettes  . Smokeless tobacco: Never Used  . Alcohol use No     Comment: Denies 06/12/2014     Allergies   Patient has no known allergies.   Review of Systems Review of Systems Ten systems reviewed and are negative for acute change, except as noted in the HPI.    Physical Exam Updated Vital Signs BP (!) 133/95 (BP Location: Left Arm)   Pulse (!) 57   Temp 98.2 F (36.8 C) (Oral)   Resp 19   LMP 01/28/2017    SpO2 93%   Physical Exam  Constitutional: She is oriented to person, place, and time.  Severe protein calorie malnutrition  HENT:  Head: Normocephalic and atraumatic.  Eyes: Conjunctivae are normal. No scleral icterus.  Neck: Normal range of motion.  Cardiovascular: Normal rate, regular rhythm and normal heart sounds.  Exam reveals no gallop and no friction rub.   No murmur heard. Pulmonary/Chest: Effort normal and breath sounds normal. No respiratory distress.  Abdominal: Soft. Bowel sounds are normal. She exhibits no distension and no mass. There is no tenderness. There is no guarding.  Neurological: She is alert and oriented to person, place, and time.  Skin: Skin is warm and dry. Capillary refill takes less than 2 seconds.  Psychiatric: Her behavior is normal.  Nursing note and vitals reviewed.    ED Treatments / Results  Labs (all labs ordered are listed, but only abnormal results are displayed) Labs Reviewed  CBC WITH DIFFERENTIAL/PLATELET - Abnormal; Notable for the following:       Result Value   WBC 11.8 (*)    RBC 2.35 (*)    Hemoglobin 8.2 (*)    HCT 22.7 (*)    MCH 34.9 (*)    MCHC 36.1 (*)    RDW 19.2 (*)    Platelets 500 (*)    nRBC 4 (*)    Lymphs Abs 5.0 (*)    Monocytes Absolute 1.4 (*)    All other components within normal limits  RETICULOCYTES - Abnormal; Notable for the following:    Retic Ct Pct 19.5 (*)    RBC. 2.35 (*)    Retic Count, Manual 458.3 (*)    All other components within normal limits  COMPREHENSIVE METABOLIC PANEL - Abnormal; Notable for the following:    Glucose, Bld 108 (*)    Total Bilirubin 7.4 (*)    All other components within normal limits  URINALYSIS, ROUTINE W REFLEX MICROSCOPIC - Abnormal; Notable for the following:    APPearance HAZY (*)    All other components within normal limits  LACTATE DEHYDROGENASE - Abnormal; Notable for the following:    LDH 255 (*)    All other components within normal limits  PROTIME-INR    I-STAT CG4 LACTIC ACID, ED  POC URINE PREG, ED    EKG  EKG Interpretation  Date/Time:  Monday February 12 2017 17:53:15 EDT Ventricular Rate:  74 PR Interval:    QRS Duration: 105 QT Interval:  394 QTC Calculation: 438 R Axis:   81 Text Interpretation:  Sinus rhythm Probable left ventricular hypertrophy No significant change since last tracing Confirmed by YAO  MD, DAVID ([id number]) on 02/12/2017 7:32:28 PM       Radiology Dg Chest 2 View  Result Date:  02/12/2017 CLINICAL DATA:  Leg pain sickle cell crisis EXAM: CHEST  2 VIEW COMPARISON:  12/21/2016 FINDINGS: The heart size and mediastinal contours are within normal limits. Both lungs are clear. The visualized skeletal structures are unremarkable. IMPRESSION: No active cardiopulmonary disease. Electronically Signed   By: Donavan Foil M.D.   On: 02/12/2017 18:39    Procedures Procedures (including critical care time)  Medications Ordered in ED Medications  0.45 % sodium chloride infusion ( Intravenous Restarted 02/12/17 2111)  ondansetron (ZOFRAN) injection 4 mg (4 mg Intravenous Given 02/12/17 1957)  HYDROmorphone (DILAUDID) injection 0.5-1 mg (0 mg Intravenous Hold 02/12/17 2109)    Or  HYDROmorphone (DILAUDID) injection 0.5-1 mg ( Subcutaneous See Alternative 02/12/17 2109)  HYDROmorphone (DILAUDID) injection 0.5-1 mg (0 mg Intravenous Hold 02/12/17 2110)    Or  HYDROmorphone (DILAUDID) injection 0.5-1 mg ( Subcutaneous See Alternative 02/12/17 2110)  HYDROmorphone (DILAUDID) injection 0.5-1 mg ( Intravenous See Alternative 02/12/17 1917)    Or  HYDROmorphone (DILAUDID) injection 0.5-1 mg (1 mg Subcutaneous Given 02/12/17 1917)  HYDROmorphone (DILAUDID) injection 0.5-1 mg (1 mg Intravenous Given 02/12/17 2001)    Or  HYDROmorphone (DILAUDID) injection 0.5-1 mg ( Subcutaneous See Alternative 02/12/17 2001)     Initial Impression / Assessment and Plan / ED Course  I have reviewed the triage vital signs and the nursing  notes.  Pertinent labs & imaging results that were available during my care of the patient were reviewed by me and considered in my medical decision making (see chart for details).  Clinical Course as of Feb 12 2130  Mon Feb 12, 2017  1838 EKG 12-Lead [AH]    Clinical Course User Index [AH] Margarita Mail, PA-C     patient labs appear at baseline.  She is fairly intoxicated and somnolent on the narcotics, However she feels that she is still having 8/10 pain when she is aroused. Patient will be admitted for sickle cell crisis. Abs appeared to be at baseline.  Final Clinical Impressions(s) / ED Diagnoses   Final diagnoses:  Sickle cell pain crisis [facility])    New Prescriptions New Prescriptions   No medications on file     Margarita Mail, PA-C 02/12/17 2131    Drenda Freeze, MD 02/12/17 2329

## 2017-02-12 NOTE — ED Notes (Signed)
Attempted an IV x 2 with no success.  

## 2017-02-12 NOTE — H&P (Signed)
History and Physical    IRIDIAN READER ZSW:[id number] DOB: 04/26/1996 DOA: 02/12/2017  PCP: No PCP Per Patient Consultants:  None Patient coming from: homeless; NOK: father  Chief Complaint: sickle cell pain crisis  HPI: [name] is a 21 y.o. female with medical history significant of sickle cell anemia, asthma, GERD, and homelessness presenting with typical pain crisis.  She reports pain in her arms and legs which started last night.  Additional history is unable to be obtained because the patient is too somnolent from the pain medications to effectively answer questions.  HPI per ER: [name] is a 21 y.o. female who presents emergency Department with chief complaint of sickle cell crisis. The patient complains of bilateral lower extremity pain. She states that her pain began this morning. Patient states that she has not been on any narcotics for the past week. She does not currently have a primary care provider. She states that last night she did run a fever up to 101, but denies any urinary symptoms, cough, chest pain, shortness of breath. The patient is noted to be icteric, but states that that is her baseline. She denies nausea or vomiting. She denies rashes or sores.   ED Course:  Labs at baseline.  Oversedation with narcotics but complains of 8/10 pain when she is aroused.  Review of Systems: As per HPI; otherwise review of systems reviewed and negative.  This is suspect due to the patient's sedation.   Ambulatory Status:  Ambulates without difficulty  PMH, PSH, [place], SH attempted to be reviewed with the patient and were reviewed in Epic.  Past Medical History:  Diagnosis Date  . Acute kidney injury ([place]) 05/15/2016  . Amenorrhea   . Anemia    SICKLE CELL  . Asthma   . Drug-induced pruritus 02/26/2015  . GERD (gastroesophageal reflux disease)   . Headache   . Hyperbilirubinemia 02/26/2015  . Sickle cell disease ([place])     Past Surgical History:    Procedure Laterality Date  . SPLENECTOMY     Age 49 for sequestration  . TONSILLECTOMY     Age 59    Social History   Social History  . Marital status: Single    Spouse name: N/A  . Number of children: N/A  . Years of education: N/A   Occupational History  . unemployed    Social History Main Topics  . Smoking status: Current Every Day Smoker    Packs/day: 0.50    Types: Cigarettes  . Smokeless tobacco: Never Used  . Alcohol use No     Comment: Denies 06/12/2014  . Drug use: Yes    Types: Marijuana  . Sexual activity: Yes    Birth control/ protection: Condom     Comment: Stable relationship, trying X2 years for pregnancy; [name] is boyfriend (19)   Other Topics Concern  . Not on file   Social History Narrative   Pt is doing much better in school [employer] 11th grade)   Really wants to go to college now after talking to her cousin.  Pt has aspiration of being a lawyer/physician/nurse/owning business after school.   Pt is now living with her father again.           Mom died at age 50 and patient has a lot of fears abotu dying young and really wants to be a mom but has other goals. She was 13 at time of moms death and does not have much contact with her  22 year old biological sister which saddens her.                    No Known Allergies  Family History  Problem Relation Age of Onset  . Hypertension Paternal Grandfather   . Sickle cell trait Father   . Cancer Mother     Died in February 06, 2009    Prior to Admission medications   Medication Sig Start Date End Date Taking? Authorizing Provider  folic acid (FOLVITE) 1 MG tablet Take 1 tablet (1 mg total) by mouth daily. Patient not taking: Reported on 08/07/2016 03/11/15   Vivi Barrack, MD  naproxen (NAPROSYN) 375 MG tablet Take 1 tablet (375 mg total) by mouth 2 (two) times daily. Patient not taking: Reported on 10/19/2016 08/07/16   Margarita Mail, PA-C    Physical Exam: Vitals:   02/12/17 02-06-14 02/12/17 2044-02-07 02/12/17  February 06, 2110 02/12/17 02/06/14  BP: 110/65 112/65 (!) 133/95 (!) 108/59  Pulse: 60 64 (!) 57 61  Resp: 15 19 19  (!) 22  Temp:      TempSrc:      SpO2: 100% 95% 93% 91%     General: She is apparently uncomfortable in that she is alternately sitting up (and falling asleep) and then lying down.  She is minimally alert enough to answer questions but intermittently moans in apparent pain. Eyes:  PERRL, EOMI, normal lids, iris, +conjunctival icterus ENT:  grossly normal hearing, lips & tongue, mmm Neck:  no LAD, masses or thyromegaly Cardiovascular:  RRR, no m/r/g. No LE edema.  Respiratory:  CTA bilaterally, no w/r/r. Normal respiratory effort. Abdomen:  soft, ntnd, NABS Skin:  no rash or induration seen on limited exam Musculoskeletal:  grossly normal tone BUE/BLE, good ROM, no bony abnormality Psychiatric:  Essentially obtunded from narcotics, minimally able to respond to questions but she does appear to respond appropriately Neurologic:  CN 2-12 grossly intact, moves all extremities in coordinated fashion, sensation intact  Labs on Admission: I have personally reviewed following labs and imaging studies  CBC:  Recent Labs Lab 02/12/17 1945  WBC 11.8*  NEUTROABS 5.3  HGB 8.2*  HCT 22.7*  MCV 96.6  PLT 440*   Basic Metabolic Panel:  Recent Labs Lab 02/12/17 1945  NA 139  K 4.0  CL 107  CO2 26  GLUCOSE 108*  BUN 15  CREATININE 0.82  CALCIUM 8.9   GFR: CrCl cannot be calculated (Unknown ideal weight.). Liver Function Tests:  Recent Labs Lab 02/12/17 1945  AST 21  ALT 14  ALKPHOS 52  BILITOT 7.4*  PROT 7.4  ALBUMIN 4.2   No results for input(s): LIPASE, AMYLASE in the last 168 hours. No results for input(s): AMMONIA in the last 168 hours. Coagulation Profile:  Recent Labs Lab 02/12/17 1945  INR 1.19   Cardiac Enzymes: No results for input(s): CKTOTAL, CKMB, CKMBINDEX, TROPONINI in the last 168 hours. BNP (last 3 results) No results for input(s): PROBNP in the  last 8760 hours. HbA1C: No results for input(s): HGBA1C in the last 72 hours. CBG: No results for input(s): GLUCAP in the last 168 hours. Lipid Profile: No results for input(s): CHOL, HDL, LDLCALC, TRIG, CHOLHDL, LDLDIRECT in the last 72 hours. Thyroid Function Tests: No results for input(s): TSH, T4TOTAL, FREET4, T3FREE, THYROIDAB in the last 72 hours. Anemia Panel:  Recent Labs  02/12/17 1945  RETICCTPCT 19.5*   Urine analysis:    Component Value Date/Time   COLORURINE YELLOW 02/12/2017 06-Feb-2006   APPEARANCEUR  HAZY (A) 02/12/2017 2007   LABSPEC 1.011 02/12/2017 2007   PHURINE 7.0 02/12/2017 2007   GLUCOSEU NEGATIVE 02/12/2017 2007   HGBUR NEGATIVE 02/12/2017 2007   BILIRUBINUR NEGATIVE 02/12/2017 2007   KETONESUR NEGATIVE 02/12/2017 2007   PROTEINUR NEGATIVE 02/12/2017 2007   UROBILINOGEN 1.0 08/11/2015 1948   NITRITE NEGATIVE 02/12/2017 2007   LEUKOCYTESUR NEGATIVE 02/12/2017 2007    Creatinine Clearance: CrCl cannot be calculated (Unknown ideal weight.).  Sepsis Labs: @LABRCNTIP (procalcitonin:4,lacticidven:4) )No results found for this or any previous visit (from the past 240 hour(s)).   Radiological Exams on Admission: Dg Chest 2 View  Result Date: 02/12/2017 CLINICAL DATA:  Leg pain sickle cell crisis EXAM: CHEST  2 VIEW COMPARISON:  12/21/2016 FINDINGS: The heart size and mediastinal contours are within normal limits. Both lungs are clear. The visualized skeletal structures are unremarkable. IMPRESSION: No active cardiopulmonary disease. Electronically Signed   By: Donavan Foil M.D.   On: 02/12/2017 18:39    EKG: Independently reviewed.  NSR with rate 74; no evidence of acute ischemia  Assessment/Plan Active Problems:   Hb-SS disease with crisis (HCC)   Tobacco abuse   Leukocytosis   Hyperbilirubinemia   Homelessness   Pertinent labs:  Bilirubin 7.4, prior 3.9 on 2/24, 6.6 on 2/22 LDH 255 WBC 11.8, prior 12.8 on 2/24 Hgb 8.2, prior 6.4 on 2/24 Negative  upreg Negative UA Lactate 1.26  Sickle cell with crisis -Patient does not have signs of infection. No indication for antibiotics.  -Hemoglobin is actually improved from prior, does not need transfusion at this moment. -Her usual admissions (2 in the last 6 months, both only 2 days; 3 other ER visits) have been short, so observation status for now seems reasonable. -Start PCA protocol for pain.  She has not been taking narcotics (has no PCP) and is quite sedated from medications given in the ER; will use the standard dose morphine PCA. -Consider folic acid and hydroxyurea implementation; she has not been taking either. -Benadryl for itch -IVF: D5-1/2NS at 150 cc/h -Zofran for nausea  Homelessness -SW consult -UDS pending  Tobacco dependence -Encourage cessation.   -Patch ordered.  DVT prophylaxis: Lovenox  Code Status: Full  Family Communication: None present Disposition Plan:  Home once clinically improved Consults called: None  Admission status: It is my clinical opinion that referral for OBSERVATION is reasonable and necessary in this patient based on the above information provided. The aforementioned taken together are felt to place the patient at high risk for further clinical deterioration. However it is anticipated that the patient may be medically stable for discharge from the hospital within 24 to 48 hours.    Karmen Bongo MD [facility]  If 7PM-7AM, please contact night-coverage [email or web address] Password [facility]  02/12/2017, 10:03 PM

## 2017-02-12 NOTE — ED Notes (Signed)
Abigail, PA is aware that patient discontinued her IV access.

## 2017-02-12 NOTE — ED Notes (Signed)
Gave report to [place], Therapist, sports for room [phone].

## 2017-02-12 NOTE — ED Notes (Signed)
When entering the room, pt is steady eating graham crackers and juice.

## 2017-02-12 NOTE — ED Notes (Signed)
Pt is being transported to radiology.

## 2017-02-12 NOTE — ED Triage Notes (Signed)
Pt reports legs pain , sickle cell crisis. Denies chest pain nor shortness of breath . Alert and oriented x 4.

## 2017-02-13 DIAGNOSIS — D638 Anemia in other chronic diseases classified elsewhere: Secondary | ICD-10-CM | POA: Diagnosis present

## 2017-02-13 DIAGNOSIS — Z681 Body mass index (BMI) 19 or less, adult: Secondary | ICD-10-CM | POA: Diagnosis not present

## 2017-02-13 DIAGNOSIS — J45909 Unspecified asthma, uncomplicated: Secondary | ICD-10-CM | POA: Diagnosis present

## 2017-02-13 DIAGNOSIS — Z9119 Patient's noncompliance with other medical treatment and regimen: Secondary | ICD-10-CM | POA: Diagnosis not present

## 2017-02-13 DIAGNOSIS — D5700 Hb-SS disease with crisis, unspecified: Secondary | ICD-10-CM | POA: Diagnosis present

## 2017-02-13 DIAGNOSIS — K219 Gastro-esophageal reflux disease without esophagitis: Secondary | ICD-10-CM | POA: Diagnosis present

## 2017-02-13 DIAGNOSIS — Z5321 Procedure and treatment not carried out due to patient leaving prior to being seen by health care provider: Secondary | ICD-10-CM | POA: Diagnosis not present

## 2017-02-13 DIAGNOSIS — D72829 Elevated white blood cell count, unspecified: Secondary | ICD-10-CM | POA: Diagnosis present

## 2017-02-13 DIAGNOSIS — Z9081 Acquired absence of spleen: Secondary | ICD-10-CM | POA: Diagnosis not present

## 2017-02-13 DIAGNOSIS — Z590 Homelessness: Secondary | ICD-10-CM | POA: Diagnosis not present

## 2017-02-13 DIAGNOSIS — F17210 Nicotine dependence, cigarettes, uncomplicated: Secondary | ICD-10-CM | POA: Diagnosis present

## 2017-02-13 DIAGNOSIS — E43 Unspecified severe protein-calorie malnutrition: Secondary | ICD-10-CM | POA: Diagnosis present

## 2017-02-13 DIAGNOSIS — E440 Moderate protein-calorie malnutrition: Secondary | ICD-10-CM | POA: Diagnosis not present

## 2017-02-13 DIAGNOSIS — Z809 Family history of malignant neoplasm, unspecified: Secondary | ICD-10-CM | POA: Diagnosis not present

## 2017-02-13 LAB — BASIC METABOLIC PANEL
ANION GAP: 7 (ref 5–15)
BUN: 17 mg/dL (ref 6–20)
CALCIUM: 8.5 mg/dL — AB (ref 8.9–10.3)
CO2: 25 mmol/L (ref 22–32)
Chloride: 109 mmol/L (ref 101–111)
Creatinine, Ser: 0.95 mg/dL (ref 0.44–1.00)
GFR calc Af Amer: >60 mL/min (ref >60)
Glucose, Bld: 105 mg/dL — ABNORMAL HIGH (ref 65–99)
Potassium: 3.9 mmol/L (ref 3.5–5.1)
SODIUM: 141 mmol/L (ref 135–145)

## 2017-02-13 LAB — CBC
HCT: 20.6 % — ABNORMAL LOW (ref 36.0–46.0)
Hemoglobin: 7.4 g/dL — ABNORMAL LOW (ref 12.0–15.0)
MCH: 34.1 pg — ABNORMAL HIGH (ref 26.0–34.0)
MCHC: 35.9 g/dL (ref 30.0–36.0)
MCV: 94.9 fL (ref 78.0–100.0)
Platelets: 448 10*3/uL — ABNORMAL HIGH (ref 150–400)
RBC: 2.17 MIL/uL — ABNORMAL LOW (ref 3.87–5.11)
RDW: 20.3 % — AB (ref 11.5–15.5)
WBC: 14.3 10*3/uL — AB (ref 4.0–10.5)

## 2017-02-13 LAB — DIFFERENTIAL
Basophils Absolute: 0 10*3/uL (ref 0.0–0.1)
Basophils Relative: 0 %
EOS PCT: 1 %
Eosinophils Absolute: 0.1 10*3/uL (ref 0.0–0.7)
LYMPHS PCT: 54 %
Lymphs Abs: 7.8 10*3/uL — ABNORMAL HIGH (ref 0.7–4.0)
MONOS PCT: 12 %
Monocytes Absolute: 1.7 10*3/uL — ABNORMAL HIGH (ref 0.1–1.0)
NEUTROS ABS: 4.7 10*3/uL (ref 1.7–7.7)
Neutrophils Relative %: 33 %

## 2017-02-13 LAB — RAPID URINE DRUG SCREEN, HOSP PERFORMED
Amphetamines: NOT DETECTED
Barbiturates: NOT DETECTED
Benzodiazepines: NOT DETECTED
COCAINE: POSITIVE — AB
OPIATES: NOT DETECTED
Tetrahydrocannabinol: NOT DETECTED

## 2017-02-13 LAB — RETICULOCYTES
RBC.: 2.13 MIL/uL — AB (ref 3.87–5.11)
RETIC COUNT ABSOLUTE: 387.7 10*3/uL — AB (ref 19.0–186.0)
RETIC CT PCT: 18.2 % — AB (ref 0.4–3.1)

## 2017-02-13 NOTE — Clinical Social Work Note (Signed)
Clinical Social Work Assessment  Patient Details  Name: [name] MRN: [id number] Date of Birth: 25-Mar-1996  Date of referral:  02/13/17               Reason for consult:  Housing Concerns/Homelessness                Permission sought to share information with:    Permission granted to share information::  No  Name::        Agency::     Relationship::     Contact Information:     Housing/Transportation Living arrangements for the past 2 months:  No permanent address Source of Information:  Patient Patient Interpreter Needed:  None Criminal Activity/Legal Involvement Pertinent to Current Situation/Hospitalization:    Significant Relationships:  Other Family Members (grandparents) Lives with:  Friends Do you feel safe going back to the place where you live?    Need for family participation in patient care:  No (Coment)  Care giving concerns:  No caregiver   Facilities manager / plan:  LCSW met with patient at bedside to assess for services.  LCSW provided information on shelters and where patient can obtain food if necessary.  Employment status:  Disabled (Comment on whether or not currently receiving Disability) (sickle cell) Insurance information:  Medicare, Medicaid In [place] PT Recommendations:  Not assessed at this time Information / Referral to community resources:  Shelter  Patient/Family's Response to care:  Patient lethargic and only answered questions when asked numerous time.  Patient reported that she was "alright" and that she stayed at different places with different people.  Patient reported that she was aware of shelters and was on the waiting list for some of these shelters. Patient reported that she was aware of places to get food and had an income on $297 monthly.   Patient/Family's Understanding of and Emotional Response to Diagnosis, Current Treatment, and Prognosis:  Patient aware of her diagnosis and discussed being transferred to the [facility].   Emotional Assessment Appearance:  Appears older than stated age Attitude/Demeanor/Rapport:  Lethargic Affect (typically observed):  Other (lethargic) Orientation:  Oriented to Self, Oriented to Place, Oriented to  Time, Oriented to Situation Alcohol / Substance use:    Psych involvement (Current and /or in the community):     Discharge Needs  Concerns to be addressed:    Readmission within the last 30 days:    Current discharge risk:    Barriers to Discharge:  No Barriers Identified   Carlean Jews, LCSW 02/13/2017, 4:09 PM

## 2017-02-13 NOTE — Progress Notes (Signed)
SICKLE CELL SERVICE PROGRESS NOTE  [name] JSE:[id number] DOB: [birth year] DOA: 02/12/2017 PCP: No PCP Per Patient  Assessment/Plan: Active Problems:   Hb-SS disease with crisis ([place])   Tobacco abuse   Leukocytosis   Hyperbilirubinemia   Homelessness  1. Hb SS with crisis: Continue PCA at current dose, Toradol and IVF. Will re-assess pain control tomorrow.  2. Leukocytosis: No evidence of infection. Likely related to crisis. 3. Anemia of Chronic Disease: Unsure of her baseline Hb. Will continue to monitor but she is currently asymptomatic.  4. Medical non-compliance: Pt has no PRimary Care Provider and is transient with her living accomodations.    Code Status: Full Code Family Communication: N/A Disposition Plan: Not yet ready for discharge  [place].  Pager [phone]. If 7PM-7AM, please contact night-coverage.  02/13/2017, 6:02 PM  LOS: 0 days   Interim History: Pt laying with eyes closed and refusing to answer questions. She has used the PCa minimally. I have asked her to allow an examination butg she refuses at this time.   Consultants:  None  Procedures:  None  Antibiotics:  None   Objective: Vitals:   02/13/17 1237 02/13/17 1413 02/13/17 1600 02/13/17 1605  BP:  100/70    Pulse:  72    Resp: 10 16 18 17   Temp:  98.2 F (36.8 C)    TempSrc:  Oral    SpO2: 100% 97% 95% 95%  Weight:      Height:       Weight change:   Intake/Output Summary (Last 24 hours) at 02/13/17 1802 Last data filed at 02/13/17 1051  Gross per 24 hour  Intake          1071.25 ml  Output              300 ml  Net           771.25 ml    General: Alert, awake, oriented x3, in no apparent distress.  HEENT: [place]/AT, EOMI, anicteric Neck: Trachea midline,  no masses, no thyromegal,y on inspection as she is refusing examination OROPHARYNX:  Appears moist on inspection. Refusing examination.   Heart: Refused examination Lungs: Refused examination. Abdomen:  Refused examination. Neuro: No apparent focal neurological deficits noted. Pt refusing examination.  Musculoskeletal: Examination refused Psychiatric: Patient alert and oriented x 3. Lymph node survey: Examination refused.   Data Reviewed: Basic Metabolic Panel:  Recent Labs Lab 02/12/17 1945 02/13/17 0401  NA 139 141  K 4.0 3.9  CL 107 109  CO2 26 25  GLUCOSE 108* 105*  BUN 15 17  CREATININE 0.82 0.95  CALCIUM 8.9 8.5*   Liver Function Tests:  Recent Labs Lab 02/12/17 1945  AST 21  ALT 14  ALKPHOS 52  BILITOT 7.4*  PROT 7.4  ALBUMIN 4.2   No results for input(s): LIPASE, AMYLASE in the last 168 hours. No results for input(s): AMMONIA in the last 168 hours. CBC:  Recent Labs Lab 02/12/17 1945 02/13/17 0401  WBC 11.8* 14.3*  NEUTROABS 5.3 4.7  HGB 8.2* 7.4*  HCT 22.7* 20.6*  MCV 96.6 94.9  PLT 500* 448*   Cardiac Enzymes: No results for input(s): CKTOTAL, CKMB, CKMBINDEX, TROPONINI in the last 168 hours. BNP (last 3 results) No results for input(s): BNP in the last 8760 hours.  ProBNP (last 3 results) No results for input(s): PROBNP in the last 8760 hours.  CBG: No results for input(s): GLUCAP in the last 168 hours.  No results found for this  or any previous visit (from the past 240 hour(s)).   Studies: Dg Chest 2 View  Result Date: 02/12/2017 CLINICAL DATA:  Leg pain sickle cell crisis EXAM: CHEST  2 VIEW COMPARISON:  12/21/2016 FINDINGS: The heart size and mediastinal contours are within normal limits. Both lungs are clear. The visualized skeletal structures are unremarkable. IMPRESSION: No active cardiopulmonary disease. Electronically Signed   By: Donavan Foil M.D.   On: 02/12/2017 18:39    Scheduled Meds: . enoxaparin (LOVENOX) injection  40 mg Subcutaneous QHS  . ketorolac  30 mg Intravenous Q6H  . morphine   Intravenous Q4H  . nicotine  14 mg Transdermal Daily   Continuous Infusions: . sodium chloride 150 mL/hr at 02/13/17 1229     Active Problems:   Hb-SS disease with crisis (HCC)   Tobacco abuse   Leukocytosis   Hyperbilirubinemia   Homelessness    In excess of 25 minutes spent during this visit. Greater than 50% involved face to face contact with the patient for assessment, counseling and coordination of care.

## 2017-02-14 DIAGNOSIS — E440 Moderate protein-calorie malnutrition: Secondary | ICD-10-CM

## 2017-02-14 MED ORDER — HYDROMORPHONE 1 MG/ML IV SOLN: INTRAVENOUS | Status: DC

## 2017-02-14 MED ORDER — ONDANSETRON HCL 4 MG/2ML IJ SOLN: 4.0000 mg | Freq: Four times a day (QID) | INTRAMUSCULAR | Status: DC | PRN

## 2017-02-14 MED ORDER — NALOXONE HCL 0.4 MG/ML IJ SOLN: 0.4000 mg | INTRAMUSCULAR | Status: DC | PRN

## 2017-02-14 MED ORDER — SODIUM CHLORIDE 0.9 % IV SOLN: 25.0000 mg | INTRAVENOUS | Status: DC | PRN

## 2017-02-14 MED ORDER — SODIUM CHLORIDE 0.9% FLUSH: 9.0000 mL | INTRAVENOUS | Status: DC | PRN

## 2017-02-14 MED ORDER — DIPHENHYDRAMINE HCL 25 MG PO CAPS: 25.0000 mg | ORAL_CAPSULE | Freq: Four times a day (QID) | ORAL | Status: DC | PRN

## 2017-02-14 MED ORDER — BISACODYL 5 MG PO TBEC: 10.0000 mg | DELAYED_RELEASE_TABLET | Freq: Every day | ORAL | Status: DC | PRN

## 2017-02-14 NOTE — Progress Notes (Addendum)
Patient states that she wants to go home and wants to sign AMA forms. Patient has been advised that this decision is against the MD recommendations. Patient has also been advised that she will take full responsibility in her decision to leave against medical advice. Patient is alert and oriented while making this decision.

## 2017-02-14 NOTE — Progress Notes (Signed)
SICKLE CELL SERVICE PROGRESS NOTE  [name] OQH:[id number] DOB: 19-Aug-1996 DOA: 02/12/2017 PCP: No PCP Per Patient  Assessment/Plan: Active Problems:   Hb-SS disease with crisis ([place])   Tobacco abuse   Leukocytosis   Hyperbilirubinemia   Homelessness   Sickle cell crisis ([place])  1. Hb SS with crisis: Will change to Dilaudid PCA. Continue Toradol and decrease IVF to [facility].  2. Leukocytosis: Likely related to crisis. No evidence of infection.  3. Anemia of Chronic Disease: Pt hemodynamically stable. No indication for transfusion.  4. Poor personal Hygeine: Pt is homeless and has very poor hygiene but is refusing a shower.  5. Malnutrition: Pt appears chronically malnourished. Will ask Nutritionist to see in consult. Also check TSH.   Code Status: Full Code Family Communication: N/A Disposition Plan: Not yet ready for discharge  [place].  Pager [phone]. If 7PM-7AM, please contact night-coverage.  02/14/2017, 3:51 PM  LOS: 1 day   Interim History: Pt reports pain improved. Pt observed ambulating down halls without any difficulty. She states that Dilaudid if more effective for her thus she has not been using the PCA. She has not had any medical care for several months and was most recently seen by Dr. Jerline Pain at [place].   Consultants:  None  Procedures:  None  Antibiotics:  None   Objective:  02/14/17 0543 02/14/17 0754 02/14/17 1035  BP:   (!) 107/48  Pulse:   73  Resp: 12 12 14   Temp:   98.1 F (36.7 C)  TempSrc:   Oral  SpO2: 98% 94% 93%  Weight:     Height:      Weight change: 0.907 kg (2 lb)  Intake/Output Summary (Last 24 hours) at 02/14/17 1551 Last data filed at 02/14/17 1420  Gross per 24 hour  Intake              480 ml  Output                0 ml  Net              480 ml    General: Alert, awake, oriented x3, in no acute distress.  HEENT: [place]/AT PEERL, EOMI, mild icterus Neck: Trachea midline,  no masses, no  thyromegal,y no JVD, no carotid bruit OROPHARYNX:  Moist, No exudate/ erythema/lesions.  Heart: Regular rhythm with mild tachycardia, II/VI SEM at base. No rubs or gallops, PMI non-displaced, no heaves or thrills on palpation.  Lungs: Clear to auscultation, no wheezing or rhonchi noted. No increased vocal fremitus resonant to percussion  Abdomen: Soft, nontender, nondistended, positive bowel sounds, no masses no hepatosplenomegaly noted.  Neuro: No focal neurological deficits noted cranial nerves II through XII grossly intact. Strength at baseline in bilateral upper and lower extremities. Musculoskeletal: No warmth swelling or erythema around joints, no spinal tenderness noted. Psychiatric: Patient alert and oriented x3, she appears to have poor insight with regard to her Sickle Cell Disease.    Data Reviewed: Basic Metabolic Panel:  Recent Labs Lab 02/12/17 1945 02/13/17 0401  NA 139 141  K 4.0 3.9  CL 107 109  CO2 26 25  GLUCOSE 108* 105*  BUN 15 17  CREATININE 0.82 0.95  CALCIUM 8.9 8.5*   Liver Function Tests:  Recent Labs Lab 02/12/17 1945  AST 21  ALT 14  ALKPHOS 52  BILITOT 7.4*  PROT 7.4  ALBUMIN 4.2   No results for input(s): LIPASE, AMYLASE in the last 168 hours.  No results for input(s): AMMONIA in the last 168 hours. CBC:  Recent Labs Lab 02/12/17 1945 02/13/17 0401  WBC 11.8* 14.3*  NEUTROABS 5.3 4.7  HGB 8.2* 7.4*  HCT 22.7* 20.6*  MCV 96.6 94.9  PLT 500* 448*   Cardiac Enzymes: No results for input(s): CKTOTAL, CKMB, CKMBINDEX, TROPONINI in the last 168 hours. BNP (last 3 results) No results for input(s): BNP in the last 8760 hours.  ProBNP (last 3 results) No results for input(s): PROBNP in the last 8760 hours.  CBG: No results for input(s): GLUCAP in the last 168 hours.  No results found for this or any previous visit (from the past 240 hour(s)).   Studies: Dg Chest 2 View  Result Date: 02/12/2017 CLINICAL DATA:  Leg pain sickle  cell crisis EXAM: CHEST  2 VIEW COMPARISON:  12/21/2016 FINDINGS: The heart size and mediastinal contours are within normal limits. Both lungs are clear. The visualized skeletal structures are unremarkable. IMPRESSION: No active cardiopulmonary disease. Electronically Signed   By: Donavan Foil M.D.   On: 02/12/2017 18:39    Scheduled Meds: . enoxaparin (LOVENOX) injection  40 mg Subcutaneous QHS  . HYDROmorphone   Intravenous Q4H  . ketorolac  30 mg Intravenous Q6H  . nicotine  14 mg Transdermal Daily   Continuous Infusions: . sodium chloride 150 mL/hr at 02/13/17 1229  . diphenhydrAMINE (BENADRYL) IVPB(SICKLE CELL ONLY)      Active Problems:   Hb-SS disease with crisis (HCC)   Tobacco abuse   Leukocytosis   Hyperbilirubinemia   Homelessness   Sickle cell crisis (HCC)     In excess of 25 minutes spent during this visit. Greater than 50% involved face to face contact with the patient for assessment, counseling and coordination of care.

## 2017-02-14 NOTE — Progress Notes (Signed)
Patient refuses incentive spirometer @ this time. Will continue to encourage use.

## 2017-02-14 NOTE — Progress Notes (Signed)
Patient signed ama form and walked out of room.

## 2017-02-14 NOTE — Progress Notes (Signed)
Dr. Zigmund Daniel made aware of patients intent to leave AMA. Patient has been informed for the second time that she is currently not ready for discharged and continues to request to sign the ama form.

## 2017-02-14 NOTE — Progress Notes (Signed)
Dilaudid 54ml wasted in sink. Verified by 2 RN's Marta Lamas RN and Lottie Dawson RN

## 2017-02-25 ENCOUNTER — Inpatient Hospital Stay (HOSPITAL_COMMUNITY)
Admission: EM | Admit: 2017-02-25 | Discharge: 2017-02-27 | DRG: 811 | Discharge status: Against Medical Advice | Payer: Medicare Other | Attending: Internal Medicine | Admitting: Internal Medicine

## 2017-02-25 DIAGNOSIS — D5700 Hb-SS disease with crisis, unspecified: Secondary | ICD-10-CM | POA: Diagnosis not present

## 2017-02-25 DIAGNOSIS — G43909 Migraine, unspecified, not intractable, without status migrainosus: Secondary | ICD-10-CM | POA: Diagnosis present

## 2017-02-25 DIAGNOSIS — Z9081 Acquired absence of spleen: Secondary | ICD-10-CM

## 2017-02-25 DIAGNOSIS — E162 Hypoglycemia, unspecified: Secondary | ICD-10-CM | POA: Diagnosis present

## 2017-02-25 DIAGNOSIS — F1410 Cocaine abuse, uncomplicated: Secondary | ICD-10-CM | POA: Diagnosis present

## 2017-02-25 DIAGNOSIS — E43 Unspecified severe protein-calorie malnutrition: Secondary | ICD-10-CM | POA: Diagnosis present

## 2017-02-25 DIAGNOSIS — I959 Hypotension, unspecified: Secondary | ICD-10-CM | POA: Diagnosis present

## 2017-02-25 DIAGNOSIS — J45909 Unspecified asthma, uncomplicated: Secondary | ICD-10-CM | POA: Diagnosis present

## 2017-02-25 DIAGNOSIS — F17210 Nicotine dependence, cigarettes, uncomplicated: Secondary | ICD-10-CM | POA: Diagnosis present

## 2017-02-25 DIAGNOSIS — Z72820 Sleep deprivation: Secondary | ICD-10-CM

## 2017-02-25 DIAGNOSIS — Z5321 Procedure and treatment not carried out due to patient leaving prior to being seen by health care provider: Secondary | ICD-10-CM | POA: Diagnosis not present

## 2017-02-25 DIAGNOSIS — Z681 Body mass index (BMI) 19 or less, adult: Secondary | ICD-10-CM

## 2017-02-25 DIAGNOSIS — Z590 Homelessness: Secondary | ICD-10-CM

## 2017-02-25 LAB — URINALYSIS, ROUTINE W REFLEX MICROSCOPIC
BILIRUBIN URINE: NEGATIVE
Glucose, UA: NEGATIVE mg/dL
Hgb urine dipstick: NEGATIVE
KETONES UR: NEGATIVE mg/dL
Leukocytes, UA: NEGATIVE
Nitrite: NEGATIVE
PH: 6 (ref 5.0–8.0)
Protein, ur: NEGATIVE mg/dL
SPECIFIC GRAVITY, URINE: 1.01 (ref 1.005–1.030)

## 2017-02-25 LAB — RAPID URINE DRUG SCREEN, HOSP PERFORMED
Amphetamines: NOT DETECTED
BARBITURATES: NOT DETECTED
Benzodiazepines: NOT DETECTED
Cocaine: POSITIVE — AB
Opiates: NOT DETECTED
TETRAHYDROCANNABINOL: NOT DETECTED

## 2017-02-25 MED ORDER — HYDROMORPHONE HCL 1 MG/ML IJ SOLN
0.5000 mg | INTRAMUSCULAR | Status: AC
  Administered 2017-02-25: 1 mg via INTRAVENOUS
  Filled 2017-02-25: qty 1

## 2017-02-25 MED ORDER — HYDROMORPHONE HCL 1 MG/ML IJ SOLN
0.5000 mg | INTRAMUSCULAR | Status: DC
  Administered 2017-02-26 (×2): 1 mg via INTRAVENOUS
  Filled 2017-02-25 (×2): qty 1

## 2017-02-25 MED ORDER — ONDANSETRON HCL 4 MG/2ML IJ SOLN
4.0000 mg | INTRAMUSCULAR | Status: DC | PRN
  Administered 2017-02-26: 4 mg via INTRAVENOUS
  Filled 2017-02-25: qty 2

## 2017-02-25 MED ORDER — HYDROMORPHONE HCL 1 MG/ML IJ SOLN
0.5000 mg | INTRAMUSCULAR | Status: DC
  Filled 2017-02-25: qty 1

## 2017-02-25 MED ORDER — HYDROMORPHONE HCL 1 MG/ML IJ SOLN: 0.5000 mg | INTRAMUSCULAR | Status: DC

## 2017-02-25 MED ORDER — SODIUM CHLORIDE 0.45 % IV SOLN
INTRAVENOUS | Status: DC
  Administered 2017-02-25: via INTRAVENOUS

## 2017-02-25 MED ORDER — KETOROLAC TROMETHAMINE 15 MG/ML IJ SOLN
15.0000 mg | INTRAMUSCULAR | Status: AC
  Administered 2017-02-25: 15 mg via INTRAVENOUS
  Filled 2017-02-25: qty 1

## 2017-02-25 MED ORDER — HYDROMORPHONE HCL 1 MG/ML IJ SOLN: 0.5000 mg | INTRAMUSCULAR | Status: AC

## 2017-02-25 NOTE — ED Notes (Signed)
Bed: WA03 Expected date:  Expected time:  Means of arrival:  Comments: EMS 21 yo female-extremity pain 7/10 x 4-hx sickle cell

## 2017-02-25 NOTE — ED Provider Notes (Signed)
[facility] DEPT Provider Note   CSN: [id number] Arrival date & time: 02/25/17  2302  By signing my name below, I, Oleh Genin, attest that this documentation has been prepared under the direction and in the presence of Ezequiel Essex, MD. Electronically Signed: Oleh Genin, Scribe. 02/25/17. 11:20 PM.   History   Chief Complaint Chief Complaint  Patient presents with  . Sickle Cell Pain Crisis    HPI [name] is a 21 y.o. female with history of sickle cell anemia, asthma, and homelessness who presents to the ED for evaluation of suspected pain crisis. The patient states that in the last 16 hours she has experienced severe pain "in her legs and arms". She typically takes oxycodone at home; however she has been out of those medications "for years" because she is "between doctors". She believes her current complaints are similar to her typical pain crisis presentation. She denies any chest pain or abdominal pain. No fever or cough. No nausea or vomiting. No chance of pregnancy. Of note, the patient was admitted 2 weeks ago with a pain crisis, at the time the patient left Against Medical Advice during admission. She states that she "thought she was better and left".   The history is provided by the patient. No language interpreter was used.  Sickle Cell Pain Crisis  Location:  Upper extremity and lower extremity Severity:  Severe Onset quality:  Gradual Similar to previous crisis episodes: yes   Timing:  Constant Chronicity:  Recurrent Associated symptoms: no chest pain, no fever, no nausea, no shortness of breath and no vomiting     Past Medical History:  Diagnosis Date  . Acute kidney injury ([place]) 05/15/2016  . Amenorrhea   . Anemia    SICKLE CELL  . Asthma   . Drug-induced pruritus 02/26/2015  . GERD (gastroesophageal reflux disease)   . Headache   . Hyperbilirubinemia 02/26/2015  . Sickle cell disease [facility])     Patient Active Problem List   Diagnosis Date Noted  . Sickle cell crisis ([place]) 02/13/2017  . Homelessness 02/12/2017  . Sinus tachycardia 12/21/2016  . Strep throat 12/21/2016  . Protein-calorie malnutrition, severe 10/21/2016  . Sepsis ([place])   . Anemia due to other cause   . Acute kidney injury ([place]) 05/15/2016  . Hypotension 05/15/2016  . Malnutrition of moderate degree 05/15/2016  . Gram-negative sepsis with organ dysfunction ([place])   . Acute pyelonephritis   . Hb-SS disease without crisis ([place])   . Pyelonephritis 05/14/2016  . Imprisonment and other incarceration   . Bibasilar crackles   . 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 Suicide ideation 03/11/2015  . Psychomotor agitation 03/11/2015  . Sickle cell anemia with pain ([place]) 03/10/2015  . Vasoocclusive sickle cell crisis ([place])   . Hyperbilirubinemia 02/26/2015  . Drug-induced pruritus 02/26/2015  . Sickle cell anemia with crisis ([place]) 02/25/2015  . Tobacco abuse   . Leukocytosis   . Hematochezia 02/27/2014  . UTI (urinary tract infection) 02/26/2014  . Vaginal discharge 02/24/2014  . Sickle cell pain crisis ([place]) 11/23/2013  . Facial rash 03/25/2013  . Birth control counseling 08/14/2012  . Fever 09/20/2011  . Asthma, mild intermittent 12/14/2010  . Hb-SS disease with crisis ([place]) 10/12/2009    Past Surgical History:  Procedure Laterality Date  . 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 TONSILLECTOMY     Age 89    OB History  No data available       Home Medications    Prior to Admission medications   Medication Sig Start Date End Date Taking? Authorizing Provider  folic acid (FOLVITE) 1 MG tablet Take 1 tablet (1 mg total) by mouth daily. Patient not taking: Reported on 08/07/2016 03/11/15   Vivi Barrack, MD  naproxen (NAPROSYN) 375 MG tablet Take 1 tablet (375 mg total) by mouth 2 (two) times daily. Patient not taking: Reported on 10/19/2016 08/07/16   Margarita Mail, PA-C    Family History Family History  Problem Relation Age of Onset  . Hypertension Paternal Grandfather   . Sickle cell trait Father   . Cancer Mother     Died in 28-Jan-2009    Social History Social History  Substance Use Topics  . Smoking status: Current Every Day Smoker    Packs/day: 0.50    Types: Cigarettes  . Smokeless tobacco: Never Used  . Alcohol use No     Comment: Denies 06/12/2014     Allergies   Patient has no known allergies.   Review of Systems Review of Systems  Constitutional: Negative for fever.  Respiratory: Negative for shortness of breath.   Cardiovascular: Negative for chest pain.  Gastrointestinal: Negative for abdominal pain, nausea and vomiting.  Musculoskeletal: Positive for arthralgias.  All other systems reviewed and are negative.    Physical Exam Updated Vital Signs BP (!) 92/50 (BP Location: Left Arm)   Pulse 78   Temp 98.2 F (36.8 C) (Oral)   Resp 14   LMP 01/28/2017   SpO2 100%   Physical Exam  Constitutional: She is oriented to person, place, and time. She appears well-developed and well-nourished. No distress.  Uncomfortable appearing.  HENT:  Head: Normocephalic and atraumatic.  Mouth/Throat: Oropharynx is clear and moist. No oropharyngeal exudate.  Eyes: Conjunctivae and EOM are normal. Pupils are equal, round, and reactive to light.  Neck: Normal range of motion. Neck supple.  No meningismus.  Cardiovascular: Normal rate, regular rhythm, normal heart sounds and intact distal pulses.   No murmur heard. Pulmonary/Chest: Effort normal and breath sounds normal. No respiratory distress.  Abdominal: Soft. There is no tenderness. There is no rebound and no guarding.  Musculoskeletal: Normal range of motion. She exhibits no edema or tenderness.  Full range of motion in all major joints without erythema or effusion.   Neurological: She is alert and oriented to person, place, and time. No cranial nerve deficit. She  exhibits normal muscle tone. Coordination normal.   5/5 strength throughout. CN 2-12 intact.Equal grip strength.   Skin: Skin is warm.  Psychiatric: She has a normal mood and affect. Her behavior is normal.  Nursing note and vitals reviewed.    ED Treatments / Results  Labs (all labs ordered are listed, but only abnormal results are displayed) Labs Reviewed  CBC WITH DIFFERENTIAL/PLATELET - Abnormal; Notable for the following:       Result Value   WBC 13.5 (*)    RBC 2.80 (*)    Hemoglobin 9.7 (*)    HCT 27.0 (*)    MCH 34.6 (*)    RDW 18.3 (*)    Platelets 657 (*)    Lymphs Abs 6.7 (*)    Monocytes Absolute 1.4 (*)    All other components within normal limits  RETICULOCYTES - Abnormal; Notable for the following:    Retic Ct Pct 13.1 (*)    RBC. 2.80 (*)    Retic Count, Manual 366.8 (*)  All other components within normal limits  COMPREHENSIVE METABOLIC PANEL - Abnormal; Notable for the following:    Glucose, Bld 58 (*)    Total Protein 8.4 (*)    Total Bilirubin 5.1 (*)    All other components within normal limits  RAPID URINE DRUG SCREEN, HOSP PERFORMED - Abnormal; Notable for the following:    Cocaine POSITIVE (*)    All other components within normal limits  CBG MONITORING, ED - Abnormal; Notable for the following:    Glucose-Capillary 121 (*)    All other components within normal limits  CBG MONITORING, ED - Abnormal; Notable for the following:    Glucose-Capillary 100 (*)    All other components within normal limits  URINALYSIS, ROUTINE W REFLEX MICROSCOPIC  PREGNANCY, URINE  CBC  CREATININE, SERUM  CBC WITH DIFFERENTIAL/PLATELET  LACTATE DEHYDROGENASE  HIV ANTIBODY (ROUTINE TESTING)    EKG  EKG Interpretation None       Radiology No results found.  Procedures Procedures (including critical care time)  Medications Ordered in ED Medications - No data to display   Initial Impression / Assessment and Plan / ED Course  I have reviewed the  triage vital signs and the nursing notes.  Pertinent labs & imaging results that were available during my care of the patient were reviewed by me and considered in my medical decision making (see chart for details).     Patient presents with typical arm and back pain of her sickle cell pain. States she has no pain medication at home. Denies fever, chills, nausea or vomiting. No chest pain or shortness of breath.  Patient admitted on April 16 and left AMA on the 18th.hemoglobin stable.  UDS with cocaine.  UDS positive for cocaine. Hypoglycemia improved with eating.  Pain persists after multiple doses of narcotic and toradol.  Patient does not feel like she can go home.  Admission for sickle cell pain crisis d/w Dr. Hal Hope.      Final Clinical Impressions(s) / ED Diagnoses   Final diagnoses:  Sickle cell pain crisis [facility])    New Prescriptions New Prescriptions   No medications on file  I personally performed the services described in this documentation, which was scribed in my presence. The recorded information has been reviewed and is accurate.    Ezequiel Essex, MD 02/26/17 [phone]

## 2017-02-25 NOTE — ED Triage Notes (Signed)
Pt is presented from home, c/o SCC pain in the upper and lower extremities. States she has not used her home pain medications.

## 2017-02-26 ENCOUNTER — Encounter (HOSPITAL_COMMUNITY): Payer: Self-pay | Admitting: Internal Medicine

## 2017-02-26 DIAGNOSIS — Z9081 Acquired absence of spleen: Secondary | ICD-10-CM | POA: Diagnosis not present

## 2017-02-26 DIAGNOSIS — Z590 Homelessness: Secondary | ICD-10-CM | POA: Diagnosis not present

## 2017-02-26 DIAGNOSIS — Z5321 Procedure and treatment not carried out due to patient leaving prior to being seen by health care provider: Secondary | ICD-10-CM | POA: Diagnosis not present

## 2017-02-26 DIAGNOSIS — J45909 Unspecified asthma, uncomplicated: Secondary | ICD-10-CM | POA: Diagnosis present

## 2017-02-26 DIAGNOSIS — F17210 Nicotine dependence, cigarettes, uncomplicated: Secondary | ICD-10-CM | POA: Diagnosis present

## 2017-02-26 DIAGNOSIS — D638 Anemia in other chronic diseases classified elsewhere: Secondary | ICD-10-CM | POA: Diagnosis not present

## 2017-02-26 DIAGNOSIS — Z72820 Sleep deprivation: Secondary | ICD-10-CM | POA: Diagnosis not present

## 2017-02-26 DIAGNOSIS — F54 Psychological and behavioral factors associated with disorders or diseases classified elsewhere: Secondary | ICD-10-CM | POA: Diagnosis not present

## 2017-02-26 DIAGNOSIS — Z681 Body mass index (BMI) 19 or less, adult: Secondary | ICD-10-CM | POA: Diagnosis not present

## 2017-02-26 DIAGNOSIS — E43 Unspecified severe protein-calorie malnutrition: Secondary | ICD-10-CM | POA: Diagnosis present

## 2017-02-26 DIAGNOSIS — E162 Hypoglycemia, unspecified: Secondary | ICD-10-CM | POA: Diagnosis present

## 2017-02-26 DIAGNOSIS — I959 Hypotension, unspecified: Secondary | ICD-10-CM | POA: Diagnosis present

## 2017-02-26 DIAGNOSIS — F1410 Cocaine abuse, uncomplicated: Secondary | ICD-10-CM | POA: Diagnosis present

## 2017-02-26 DIAGNOSIS — G43009 Migraine without aura, not intractable, without status migrainosus: Secondary | ICD-10-CM | POA: Diagnosis not present

## 2017-02-26 DIAGNOSIS — G43909 Migraine, unspecified, not intractable, without status migrainosus: Secondary | ICD-10-CM | POA: Diagnosis present

## 2017-02-26 DIAGNOSIS — D5700 Hb-SS disease with crisis, unspecified: Secondary | ICD-10-CM | POA: Diagnosis present

## 2017-02-26 LAB — RETICULOCYTES
RBC.: 2.8 MIL/uL — ABNORMAL LOW (ref 3.87–5.11)
RETIC COUNT ABSOLUTE: 366.8 10*3/uL — AB (ref 19.0–186.0)
Retic Ct Pct: 13.1 % — ABNORMAL HIGH (ref 0.4–3.1)

## 2017-02-26 LAB — CBC WITH DIFFERENTIAL/PLATELET
BASOS ABS: 0.1 10*3/uL (ref 0.0–0.1)
BASOS PCT: 0 %
Basophils Absolute: 0 10*3/uL (ref 0.0–0.1)
Basophils Relative: 0 %
EOS PCT: 2 %
Eosinophils Absolute: 0.1 10*3/uL (ref 0.0–0.7)
Eosinophils Absolute: 0.2 10*3/uL (ref 0.0–0.7)
Eosinophils Relative: 1 %
HCT: 27 % — ABNORMAL LOW (ref 36.0–46.0)
HEMATOCRIT: 22.8 % — AB (ref 36.0–46.0)
Hemoglobin: 8.3 g/dL — ABNORMAL LOW (ref 12.0–15.0)
Hemoglobin: 9.7 g/dL — ABNORMAL LOW (ref 12.0–15.0)
LYMPHS PCT: 58 %
LYMPHS PCT: 50 %
Lymphs Abs: 6.7 10*3/uL — ABNORMAL HIGH (ref 0.7–4.0)
Lymphs Abs: 6.9 10*3/uL — ABNORMAL HIGH (ref 0.7–4.0)
MCH: 34.6 pg — ABNORMAL HIGH (ref 26.0–34.0)
MCH: 34.7 pg — ABNORMAL HIGH (ref 26.0–34.0)
MCHC: 35.9 g/dL (ref 30.0–36.0)
MCHC: 36.4 g/dL — ABNORMAL HIGH (ref 30.0–36.0)
MCV: 95.4 fL (ref 78.0–100.0)
MCV: 96.4 fL (ref 78.0–100.0)
MONO ABS: 1.4 10*3/uL — AB (ref 0.1–1.0)
MONOS PCT: 8 %
MONOS PCT: 11 %
Monocytes Absolute: 0.9 10*3/uL (ref 0.1–1.0)
NEUTROS ABS: 5.2 10*3/uL (ref 1.7–7.7)
NEUTROS PCT: 32 %
Neutro Abs: 3.7 10*3/uL (ref 1.7–7.7)
Neutrophils Relative %: 38 %
PLATELETS: 657 10*3/uL — AB (ref 150–400)
Platelets: 536 10*3/uL — ABNORMAL HIGH (ref 150–400)
RBC: 2.39 MIL/uL — AB (ref 3.87–5.11)
RBC: 2.8 MIL/uL — ABNORMAL LOW (ref 3.87–5.11)
RDW: 18.1 % — ABNORMAL HIGH (ref 11.5–15.5)
RDW: 18.3 % — ABNORMAL HIGH (ref 11.5–15.5)
WBC: 11.7 10*3/uL — AB (ref 4.0–10.5)
WBC: 13.5 10*3/uL — ABNORMAL HIGH (ref 4.0–10.5)

## 2017-02-26 LAB — COMPREHENSIVE METABOLIC PANEL
ALT: 16 U/L (ref 14–54)
ANION GAP: 7 (ref 5–15)
AST: 22 U/L (ref 15–41)
Albumin: 5 g/dL (ref 3.5–5.0)
Alkaline Phosphatase: 63 U/L (ref 38–126)
BUN: 10 mg/dL (ref 6–20)
CHLORIDE: 108 mmol/L (ref 101–111)
CO2: 26 mmol/L (ref 22–32)
Calcium: 9.6 mg/dL (ref 8.9–10.3)
Creatinine, Ser: 0.93 mg/dL (ref 0.44–1.00)
GFR calc non Af Amer: >60 mL/min (ref >60)
Glucose, Bld: 58 mg/dL — ABNORMAL LOW (ref 65–99)
POTASSIUM: 4.6 mmol/L (ref 3.5–5.1)
SODIUM: 141 mmol/L (ref 135–145)
Total Bilirubin: 5.1 mg/dL — ABNORMAL HIGH (ref 0.3–1.2)
Total Protein: 8.4 g/dL — ABNORMAL HIGH (ref 6.5–8.1)

## 2017-02-26 LAB — CREATININE, SERUM
CREATININE: 0.95 mg/dL (ref 0.44–1.00)
GFR calc Af Amer: >60 mL/min (ref >60)
GFR calc non Af Amer: >60 mL/min (ref >60)

## 2017-02-26 LAB — CBG MONITORING, ED
GLUCOSE-CAPILLARY: 121 mg/dL — AB (ref 65–99)
GLUCOSE-CAPILLARY: 100 mg/dL — AB (ref 65–99)

## 2017-02-26 LAB — HIV ANTIBODY (ROUTINE TESTING W REFLEX): HIV Screen 4th Generation wRfx: NONREACTIVE

## 2017-02-26 LAB — LACTATE DEHYDROGENASE: LDH: 228 U/L — AB (ref 98–192)

## 2017-02-26 LAB — PREGNANCY, URINE: Preg Test, Ur: NEGATIVE

## 2017-02-26 MED ORDER — POLYETHYLENE GLYCOL 3350 17 G PO PACK: 17.0000 g | PACK | Freq: Every day | ORAL | Status: DC | PRN

## 2017-02-26 MED ORDER — DIPHENHYDRAMINE HCL 50 MG/ML IJ SOLN: 12.5000 mg | Freq: Four times a day (QID) | INTRAMUSCULAR | Status: DC | PRN

## 2017-02-26 MED ORDER — SODIUM CHLORIDE 0.9% FLUSH: 9.0000 mL | INTRAVENOUS | Status: DC | PRN

## 2017-02-26 MED ORDER — ENOXAPARIN SODIUM 40 MG/0.4ML ~~LOC~~ SOLN
40.0000 mg | SUBCUTANEOUS | Status: DC
  Administered 2017-02-26 – 2017-02-27 (×2): 40 mg via SUBCUTANEOUS
  Filled 2017-02-26 (×3): qty 0.4

## 2017-02-26 MED ORDER — DIPHENHYDRAMINE HCL 12.5 MG/5ML PO ELIX: 12.5000 mg | ORAL_SOLUTION | Freq: Four times a day (QID) | ORAL | Status: DC | PRN

## 2017-02-26 MED ORDER — SENNOSIDES-DOCUSATE SODIUM 8.6-50 MG PO TABS
1.0000 | ORAL_TABLET | Freq: Two times a day (BID) | ORAL | Status: DC
  Administered 2017-02-26 – 2017-02-27 (×3): 1 via ORAL
  Filled 2017-02-26 (×3): qty 1

## 2017-02-26 MED ORDER — NALOXONE HCL 0.4 MG/ML IJ SOLN
0.4000 mg | INTRAMUSCULAR | Status: DC | PRN
Start: 2017-02-26 — End: 2017-02-27

## 2017-02-26 MED ORDER — NALOXONE HCL 0.4 MG/ML IJ SOLN: 0.4000 mg | INTRAMUSCULAR | Status: DC | PRN

## 2017-02-26 MED ORDER — ENSURE ENLIVE PO LIQD
237.0000 mL | Freq: Two times a day (BID) | ORAL | Status: DC
  Administered 2017-02-27: 237 mL via ORAL

## 2017-02-26 MED ORDER — DEXTROSE-NACL 5-0.45 % IV SOLN
INTRAVENOUS | Status: AC
  Administered 2017-02-26 (×2): via INTRAVENOUS

## 2017-02-26 MED ORDER — OXYCODONE HCL 5 MG PO TABS
5.0000 mg | ORAL_TABLET | Freq: Four times a day (QID) | ORAL | Status: DC
  Administered 2017-02-26 – 2017-02-27 (×4): 5 mg via ORAL
  Filled 2017-02-26 (×4): qty 1

## 2017-02-26 MED ORDER — ONDANSETRON HCL 4 MG/2ML IJ SOLN: 4.0000 mg | Freq: Four times a day (QID) | INTRAMUSCULAR | Status: DC | PRN

## 2017-02-26 MED ORDER — HYDROMORPHONE HCL 1 MG/ML IJ SOLN
1.0000 mg | Freq: Once | INTRAMUSCULAR | Status: AC
  Administered 2017-02-26: 1 mg via INTRAMUSCULAR
  Filled 2017-02-26: qty 1

## 2017-02-26 MED ORDER — KETOROLAC TROMETHAMINE 15 MG/ML IJ SOLN
15.0000 mg | Freq: Four times a day (QID) | INTRAMUSCULAR | Status: AC
  Administered 2017-02-26 – 2017-02-27 (×4): 15 mg via INTRAVENOUS
  Filled 2017-02-26 (×4): qty 1

## 2017-02-26 MED ORDER — HYDROMORPHONE 1 MG/ML IV SOLN
INTRAVENOUS | Status: DC
  Administered 2017-02-26: 0.6 mg via INTRAVENOUS
  Administered 2017-02-26: 07:00:00 via INTRAVENOUS
  Administered 2017-02-27: 0.3 mg via INTRAVENOUS
  Administered 2017-02-27: 1.2 mg via INTRAVENOUS
  Administered 2017-02-27: 0.6 mg via INTRAVENOUS
  Administered 2017-02-27: 0 mg via INTRAVENOUS
  Filled 2017-02-26: qty 25

## 2017-02-26 NOTE — ED Notes (Signed)
Pt was coaxed to stand up, unwilling to ambulate.

## 2017-02-26 NOTE — Progress Notes (Signed)
CSW consulted to address homelessness. Met with pt at bedside. Pt states she is living with a friend and that she will return there at DC. Denies any LCSW interventions needed.  CSW signing off. Pt agrees to voice any further social work needs and may reconsult if needed.   Sharren Bridge, MSW, LCSW Clinical Social Work 02/26/2017 [phone]

## 2017-02-26 NOTE — Progress Notes (Signed)
Pt woke up for a short period of time, PCA turned back on. Pt sleeping long periods unable to finish admission questions in couple areas.

## 2017-02-26 NOTE — ED Notes (Signed)
Pt unwillingly ambulated in the hall, but did just fine---- pt's gait and balance steady.

## 2017-02-26 NOTE — Progress Notes (Signed)
Initial Nutrition Assessment  DOCUMENTATION CODES:   Severe malnutrition in context of acute illness/injury  INTERVENTION:   Provide Ensure Enlive po BID, each supplement provides 350 kcal and 20 grams of protein Encourage PO intake RD to continue to monitor  NUTRITION DIAGNOSIS:   Malnutrition related to acute illness as evidenced by moderate depletion of body fat, moderate depletions of muscle mass, energy intake < or equal to 50% for > or equal to 5 days.  GOAL:   Patient will meet greater than or equal to 90% of their needs  MONITOR:   PO intake, Supplement acceptance, Labs, Weight trends, I & O's  REASON FOR ASSESSMENT:   Consult Assessment of nutrition requirement/status  ASSESSMENT:   21 y.o. female with history of sickle cell anemia presents to the ER because of increasing pain in the upper and lower extremity over the last 24 hours. Denies any chest pain headache visual symptoms or focal deficits.  Pt in room snacking on graham crackers and drinking juice. Pt states she eats "okay" and was eating normally PTA. Pt states she slept through breakfast this morning so she didn't eat anything. Suspect this is normal for patient. Given substance abuse (cocaine), pt has been consuming poor quality diet. Pt currently reports being homeless.  Pt is willing to drink Ensure supplements in between meals. Will order.  Per chart review, pt has lost 8 lb since 10/20/16 (7% wt loss x 4 months, insignificant for time frame). Nutrition-Focused physical exam completed. Findings are moderate fat depletion, moderate muscle depletion, and no edema.   Medications: Senokot-S tablet BID, D5 -.45% NaCl infusion at 75 ml/hr -provides 306 kcal Labs reviewed: CBGs: 100-121  Diet Order:  Diet regular Room service appropriate? Yes; Fluid consistency: Thin  Skin:  Reviewed, no issues  Last BM:  4/29  Height:   Ht Readings from Last 1 Encounters:  02/12/17 5\' 2"  (1.575 m)    Weight:    Wt Readings from Last 1 Encounters:  02/14/17 110 lb (49.9 kg)    Ideal Body Weight:  50 kg  BMI:  19 kg/m^2  Estimated Nutritional Needs:   Kcal:  1500-1700  Protein:  70-80g  Fluid:  1.7L/day  EDUCATION NEEDS:   Education needs addressed  Clayton Bibles, MS, RD, LDN Pager: [phone] After Hours Pager: [phone]

## 2017-02-26 NOTE — Progress Notes (Signed)
SICKLE CELL SERVICE PROGRESS NOTE  [name] [facility] TDD:[id number] DOB: Jun 11, 1996 DOA: 02/25/2017 PCP: No PCP Per Patient  Assessment/Plan: Principal Problem:   Sickle cell pain crisis ([place])   1. Hb SS with crisis: Continue PCA. Schedule Oxycodone 5 mg every 6 hours. Continue Toradol and IVF. Re-assess pain control tomorrow.  2. Hypoglycemia: Pt's BS improved with oral intake. She was without food for several days and was able to obtain some food at a church a few days ago. I do not see the cause of her hypoglycemia as of medical etiology but rather a social ill. I will add Ensure while she is hospitalized, but this is only temporary and I have asked the [facility] to see patient while hospitalized to try to affect a chronic solution.  3. Severe Malnutrition:  Appreciate Nutritional assessment and recommendations.  4. Marijuana Use: Pt states that she uses Marijuana whenever she can obtain it from friends. She is adamant that she does not use cocaine of any form but does not know if the MJ that she smokes may be laced with other substances as it usually belongs to people she meets on the streets.  5. Chronic Homelessness: She has been sleeping on the streets and has not used the shelters. When asked why, she became initially teary and then stated that she did noto want to discuss it. She also does not ave a PMD an as such has been using Marijuana to manage her pain. 6. Psychological Trauma: This patient has had trauma in the form of the death of her mother during her childhood as well as separation form her younger sister and self imposed estrangement from her family. She has very poor personal hygiene and when offered a bath on her last admission she refused. Additionally the Case Manager at the [facility] reports that she has been trying to assist the patient with housing and medical care but Ms. Dimartino has not been cooperative.  I question if there is some underlying  undiagnosed Psychiatric condition. I will ask Psychiatry to assess and make recommendations. Pt has had minimal use of the PCA since admission and is sleeping excessively. She is sleep deprived from being homeless and just being on the streets.   Code Status: Full Code Family Communication: N/A Disposition Plan: Not yet ready for discharge  [place].  Pager [phone]. If 7PM-7AM, please contact night-coverage.  02/26/2017, 11:30 AM  LOS: 0 days   Interim History: She is sleep deprived from being homeless and just being on the streets. She also reports that she has had very little to eat in the past several weeks and has been getting some food from churches when she could get to them. She reports her pain localized to her arms and legs at an intensity of 7/10. She has had minimal use of the PCA since admission and is sleeping excessively due to- according to her- sleep deprivation.   Consultants:  Psychiatry  Procedures:  None  Antibiotics:  None   Objective: Vitals:   02/26/17 0500 02/26/17 0502 02/26/17 0600 02/26/17 1102  BP: 101/62 101/62 100/68 (!) 103/48  Pulse: (!) 59 67 60 60  Resp:  18 18 12   Temp:   97.4 F (36.3 C) 98.2 F (36.8 C)  TempSrc:   Oral Oral  SpO2:  97% 95% 100%   Weight change:   Intake/Output Summary (Last 24 hours) at 02/26/17 1130 Last data filed at 02/26/17 1038  Gross per 24 hour  Intake            517.5 ml  Output                0 ml  Net            517.5 ml    General: Alert, awake, oriented x3, in minimal distress. Severely underweight. HEENT: [place]/AT PEERL, EOMI, anicteric. Muddy Sclera present. Neck: Trachea midline,  no masses, no thyromegal,y no JVD, no carotid bruit OROPHARYNX:  Moist, No exudate/ erythema/lesions.  Heart: Regular rate and rhythm, without murmurs, rubs, gallops, PMI non-displaced, no heaves or thrills on palpation.  Lungs: Clear to auscultation, no wheezing or rhonchi noted. No increased vocal fremitus  resonant to percussion  Abdomen: Soft, nontender, nondistended, positive bowel sounds, no masses no hepatosplenomegaly noted.  Neuro: No focal neurological deficits noted cranial nerves II through XII grossly intact.  Strength at baseline in bilateral upper and lower extremities. Musculoskeletal: No warmth swelling or erythema around joints, no spinal tenderness noted. Psychiatric: Patient alert and oriented x3, good insight and cognition, good recent to remote recall.    Data Reviewed: Basic Metabolic Panel:  Recent Labs Lab 02/25/17 2346 02/26/17 0840  NA 141  --   K 4.6  --   CL 108  --   CO2 26  --   GLUCOSE 58*  --   BUN 10  --   CREATININE 0.93 0.95  CALCIUM 9.6  --    Liver Function Tests:  Recent Labs Lab 02/25/17 2346  AST 22  ALT 16  ALKPHOS 63  BILITOT 5.1*  PROT 8.4*  ALBUMIN 5.0   No results for input(s): LIPASE, AMYLASE in the last 168 hours. No results for input(s): AMMONIA in the last 168 hours. CBC:  Recent Labs Lab 02/25/17 2346 02/26/17 0840  WBC 13.5* 11.7*  NEUTROABS 5.2 3.7  HGB 9.7* 8.3*  HCT 27.0* 22.8*  MCV 96.4 95.4  PLT 657* 536*   Cardiac Enzymes: No results for input(s): CKTOTAL, CKMB, CKMBINDEX, TROPONINI in the last 168 hours. BNP (last 3 results) No results for input(s): BNP in the last 8760 hours.  ProBNP (last 3 results) No results for input(s): PROBNP in the last 8760 hours.  CBG:  Recent Labs Lab 02/26/17 0140 02/26/17 0259  GLUCAP 121* 100*    No results found for this or any previous visit (from the past 240 hour(s)).   Studies: Dg Chest 2 View  Result Date: 02/12/2017 CLINICAL DATA:  Leg pain sickle cell crisis EXAM: CHEST  2 VIEW COMPARISON:  12/21/2016 FINDINGS: The heart size and mediastinal contours are within normal limits. Both lungs are clear. The visualized skeletal structures are unremarkable. IMPRESSION: No active cardiopulmonary disease. Electronically Signed   By: Donavan Foil M.D.   On:  02/12/2017 18:39    Scheduled Meds: . enoxaparin (LOVENOX) injection  40 mg Subcutaneous Q24H  . feeding supplement (ENSURE ENLIVE)  237 mL Oral BID BM  . HYDROmorphone   Intravenous Q4H  . ketorolac  15 mg Intravenous Q6H  . senna-docusate  1 tablet Oral BID   Continuous Infusions: . dextrose 5 % and 0.45% NaCl 75 mL/hr at 02/26/17 1497    Principal Problem:   Sickle cell pain crisis (HCC)     In excess of 25 minutes spent during this visit. Greater than 50% involved face to face contact with the patient for assessment, counseling and coordination of care.

## 2017-02-26 NOTE — H&P (Signed)
History and Physical    [name] JTT:[id number] DOB: [birth year] DOA: 02/25/2017  PCP: No PCP Per Patient  Patient coming from: Patient is homeless.  Chief Complaint: Extremity pain.  HPI: [name] is a 21 y.o. female with history of sickle cell anemia presents to the ER because of increasing pain in the upper and lower extremity over the last 24 hours. Denies any chest pain headache visual symptoms or focal deficits.   ED Course: In the ER patient was given multiple doses of Dilaudid and also was given Toradol despite which patient was still having pain and pain admitted for sickle cell pain crisis. Hemoglobin is at baseline. Patient also was hypoglycemic which improved with eating. Urine drug screen was positive for cocaine.  Review of Systems: As per HPI, rest all negative.   Past Medical History:  Diagnosis Date  . Acute kidney injury ([place]) 05/15/2016  . Amenorrhea   . Anemia    SICKLE CELL  . Asthma   . Drug-induced pruritus 02/26/2015  . GERD (gastroesophageal reflux disease)   . Headache   . Hyperbilirubinemia 02/26/2015  . Sickle cell disease ([place])     Past Surgical History:  Procedure Laterality Date  . 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 TONSILLECTOMY     Age 67     reports that she has been smoking Cigarettes.  She has been smoking about 0.50 packs per day. She has never used smokeless tobacco. She reports that she uses drugs, including Marijuana. She reports that she does not drink alcohol.  No Known Allergies  Family History  Problem Relation Age of Onset  . Hypertension Paternal Grandfather   . Sickle cell trait Father   . Cancer Mother     Died in Feb 24, 2009    Prior to Admission medications   Medication Sig Start Date End Date Taking? Authorizing Provider  folic acid (FOLVITE) 1 MG tablet Take 1 tablet (1 mg total) by mouth daily. Patient not taking: Reported on 08/07/2016 03/11/15   Vivi Barrack, MD  naproxen (NAPROSYN)  375 MG tablet Take 1 tablet (375 mg total) by mouth 2 (two) times daily. Patient not taking: Reported on 10/19/2016 08/07/16   Margarita Mail, PA-C    Physical Exam: Vitals:   02/26/17 0233 02/26/17 0345 02/26/17 0500 02/26/17 0502  BP: (!) 98/55 102/62 101/62 101/62  Pulse: 82 65 (!) 59 67  Resp: 18 16  18   Temp:      TempSrc:      SpO2: 97% 99%  97%      Constitutional: Poorly built and nourished. Vitals:   02/26/17 0233 02/26/17 0345 02/26/17 0500 02/26/17 0502  BP: (!) 98/55 102/62 101/62 101/62  Pulse: 82 65 (!) 59 67  Resp: 18 16  18   Temp:      TempSrc:      SpO2: 97% 99%  97%   Eyes: Anicteric no pallor. ENMT: No discharge from the ears eyes nose or mouth. Neck: No mass felt. No neck rigidity. Respiratory: No rhonchi or crepitations. Cardiovascular: S1-S2 heard no murmurs appreciated. Abdomen: Soft nontender bowel sounds present. Musculoskeletal: No edema no joint effusion. Skin: No rash. Skin appears warm. Neurologic: Alert awake oriented to time place and person. Moves all extremities. Psychiatric: Appears normal. Normal affect.   Labs on Admission: I have personally reviewed following labs and imaging studies  CBC:  Recent Labs Lab 02/25/17 2346  WBC 13.5*  NEUTROABS 5.2  HGB 9.7*  HCT 27.0*  MCV 96.4  PLT 657*   Basic Metabolic Panel:  Recent Labs Lab 02/25/17 2346  NA 141  K 4.6  CL 108  CO2 26  GLUCOSE 58*  BUN 10  CREATININE 0.93  CALCIUM 9.6   GFR: Estimated Creatinine Clearance: 74.7 mL/min (by C-G formula based on SCr of 0.93 mg/dL). Liver Function Tests:  Recent Labs Lab 02/25/17 2346  AST 22  ALT 16  ALKPHOS 63  BILITOT 5.1*  PROT 8.4*  ALBUMIN 5.0   No results for input(s): LIPASE, AMYLASE in the last 168 hours. No results for input(s): AMMONIA in the last 168 hours. Coagulation Profile: No results for input(s): INR, PROTIME in the last 168 hours. Cardiac Enzymes: No results for input(s): CKTOTAL, CKMB,  CKMBINDEX, TROPONINI in the last 168 hours. BNP (last 3 results) No results for input(s): PROBNP in the last 8760 hours. HbA1C: No results for input(s): HGBA1C in the last 72 hours. CBG:  Recent Labs Lab 02/26/17 0140 02/26/17 0259  GLUCAP 121* 100*   Lipid Profile: No results for input(s): CHOL, HDL, LDLCALC, TRIG, CHOLHDL, LDLDIRECT in the last 72 hours. Thyroid Function Tests: No results for input(s): TSH, T4TOTAL, FREET4, T3FREE, THYROIDAB in the last 72 hours. Anemia Panel:  Recent Labs  02/25/17 2346  RETICCTPCT 13.1*   Urine analysis:    Component Value Date/Time   COLORURINE YELLOW 02/25/2017 2338   APPEARANCEUR CLEAR 02/25/2017 2338   LABSPEC 1.010 02/25/2017 2338   PHURINE 6.0 02/25/2017 [place] 02/25/2017 [place] 02/25/2017 2338   BILIRUBINUR NEGATIVE 02/25/2017 [place] 02/25/2017 2338   PROTEINUR NEGATIVE 02/25/2017 2338   UROBILINOGEN 1.0 08/11/2015 1948   NITRITE NEGATIVE 02/25/2017 2338   LEUKOCYTESUR NEGATIVE 02/25/2017 2338   Sepsis Labs: @LABRCNTIP (procalcitonin:4,lacticidven:4) )No results found for this or any previous visit (from the past 240 hour(s)).   Radiological Exams on Admission: No results found.   Assessment/Plan Principal Problem:   Sickle cell pain crisis ([place])    1. Sickle cell pain crisis - patient has been placed on scheduled Toradol and Dilaudid PCA. Patient is not on any long-acting pain relief medications. 2. Hypoglycemia probably from poor oral intake and nutritional status - dietitian consult requested. Patient is presently on D5NS. Follow CBGs. 3. Cocaine abuse - social work consult.   DVT prophylaxis: Lovenox. Code Status: Full code.  Family Communication: Discussed with patient.  Disposition Plan: To be determined.  Consults called: Education officer, museum. Admission status: Inpatient.    Rise Patience MD [facility] Pager [phone].  If 7PM-7AM,  please contact night-coverage [email or web address] Password [email or web address]  02/26/2017, 5:58 AM

## 2017-02-26 NOTE — Progress Notes (Signed)
Patient ID: [name], female   DOB: Jul 28, 1996, 21 y.o.   MRN: [id number]  Received psych consult and went to her room along with LCSW. Patient could not woke up with verbal stimuli and she could not talk even though woke up briefly with tactile stimuli and drifted back to slee with in few seconds. It seems like she may have taken pain medication prior to my arrival. Will try to catch up with her when she is awake and able to participate in psych evaluation.  Daijah Scrivens 02/26/2017 1:44 PM

## 2017-02-26 NOTE — Discharge Instructions (Signed)
Follow up with the sickle cell clinic this morning. Return to the ED if you develop worsening symptoms.

## 2017-02-26 NOTE — Progress Notes (Signed)
Pt told she needs to keep O2 on while on PCA.Not complying, PCA turned off. Hospitalist notified by text. Dr. Zigmund Daniel also texted PCA turned off.

## 2017-02-27 DIAGNOSIS — F54 Psychological and behavioral factors associated with disorders or diseases classified elsewhere: Secondary | ICD-10-CM

## 2017-02-27 DIAGNOSIS — D638 Anemia in other chronic diseases classified elsewhere: Secondary | ICD-10-CM

## 2017-02-27 DIAGNOSIS — D5700 Hb-SS disease with crisis, unspecified: Principal | ICD-10-CM

## 2017-02-27 DIAGNOSIS — G43009 Migraine without aura, not intractable, without status migrainosus: Secondary | ICD-10-CM

## 2017-02-27 LAB — GLUCOSE, CAPILLARY
GLUCOSE-CAPILLARY: 135 mg/dL — AB (ref 65–99)
Glucose-Capillary: 100 mg/dL — ABNORMAL HIGH (ref 65–99)

## 2017-02-27 MED ORDER — DEXTROSE-NACL 5-0.45 % IV SOLN: INTRAVENOUS | Status: DC

## 2017-02-27 MED ORDER — KETOROLAC TROMETHAMINE 15 MG/ML IJ SOLN: 15.0000 mg | Freq: Four times a day (QID) | INTRAMUSCULAR | Status: DC

## 2017-02-27 NOTE — Progress Notes (Addendum)
PT left AMA She stated that she had to get home altho she stated she didn't have a home but she was leaving anyway .I asked her to stay until we got her pain under control .She just stated 'I 'm leaving. I know what I am doing

## 2017-02-27 NOTE — Progress Notes (Addendum)
SICKLE CELL SERVICE PROGRESS NOTE  [name] [facility] AQT:[id number] DOB: January 16, 1996 DOA: 02/25/2017 PCP: No PCP Per Patient  Assessment/Plan: Principal Problem:   Sickle cell pain crisis ([place])  1. Hb SS with Crisis: Will continue PCA and scheduled Oxycodone. Pt encouraged to use PCA. Continue Toradol and decrease IVF to [facility]. Electrophoresis pending.  2. Hypotension: SBP < 100. Unsure if this is her baseline. Will have her ambulate and assess tolerance to activity and recheck BP and HR with standing.  3. Migraine HA: Will obtain MRA to assess for intracranial vasculopathy and if none will treat with Excedrin Migraine. 4. Hypoglycemia: Resolved after eating.  5. Severe Malnutrition: Pt with robust appetite. Continue to encourage PO intake that is > 90% of needs. Continue Ensure.  6. Maladaptive Health Behaviors: Pt has issues with trust and may have some underlying Psychiatric condition. She would benefit from Psychotherapy and Psychiatry evaluation. Appreciate SW input.    Code Status: Full Code Family Communication: Father in Room and updated. Disposition Plan: Not yet ready for discharge  Thadd Apuzzo A.  Pager [phone]. If 7PM-7AM, please contact night-coverage.  02/27/2017, 10:25 AM  LOS: 1 day   Interim History: Pt sleeping when I initially entered room. She initially refused to answer my questions but then opened her eyes and reported that she was having a headache and that she wanted to be left alone. After some dialogue she became more amenable and apologized for her profanity. She went onto state that she did not see the point in worrying about her disease as she expected to die by age 19 just as her mother did. She consented to her father come back into the room and in his presence I discussed the need for consistent outpatient follow up, adhering to medications that manage disease and not just symptoms, care by a Hematologist, better understanding of the disease and it's  complications and both a Psychiatric evaluation as well as ongoing therapy given the trauma of her life both with regard to her disease and her mother's death. Pt is agreeable to seeing Psychiatry.  Today she reports that her pain is 5/10 and localized to her legs and back. She also has a H/A which is unilaterally Temporally located on the right and which she describes as throbbing in nature. She has no changes in vision nor does she have any weakness.    Consultants:  Psychiatry  Procedures:  None  Antibiotics:  None   Objective: Vitals:   02/27/17 0205 02/27/17 0352 02/27/17 0600 02/27/17 0820  BP: (!) 103/42  (!) 103/50   Pulse: 71  69   Resp: 12 16 16 15   Temp: 98.5 F (36.9 C)  97.5 F (36.4 C)   TempSrc: Oral  Oral   SpO2: 96% 97% 96% 95%  Weight:   51.6 kg (113 lb 12.1 oz)    Weight change:   Intake/Output Summary (Last 24 hours) at 02/27/17 1025 Last data filed at 02/27/17 9373  Gross per 24 hour  Intake           1117.5 ml  Output              600 ml  Net            517.5 ml    Physical Exam General: Alert, awake, oriented x3, in no acute distress. Poor personal hygiene HEENT: [place]/AT PEERL, EOMI, anicteric Neck: Trachea midline,  no masses, no thyromegal,y no JVD, no carotid bruit OROPHARYNX:  Moist, No exudate/ erythema/lesions.  Heart: Regular rate and rhythm, without murmurs, rubs, gallops, PMI non-displaced, no heaves or thrills on palpation.  Lungs: Clear to auscultation, no wheezing or rhonchi noted. No increased vocal fremitus resonant to percussion  Abdomen: Soft, nontender, nondistended, positive bowel sounds, no masses no hepatosplenomegaly noted.  Neuro: No focal neurological deficits noted cranial nerves II through XII grossly intact.  Strength functional  in bilateral upper and lower extremities. Musculoskeletal: No warmth swelling or erythema around joints, no spinal tenderness noted. Psychiatric: Patient alert and oriented x3, good insight and  cognition but limited resources, good recent to remote recall.    Data Reviewed: Basic Metabolic Panel:  Recent Labs Lab 02/25/17 2346 02/26/17 0840  NA 141  --   K 4.6  --   CL 108  --   CO2 26  --   GLUCOSE 58*  --   BUN 10  --   CREATININE 0.93 0.95  CALCIUM 9.6  --    Liver Function Tests:  Recent Labs Lab 02/25/17 2346  AST 22  ALT 16  ALKPHOS 63  BILITOT 5.1*  PROT 8.4*  ALBUMIN 5.0   No results for input(s): LIPASE, AMYLASE in the last 168 hours. No results for input(s): AMMONIA in the last 168 hours. CBC:  Recent Labs Lab 02/25/17 2346 02/26/17 0840  WBC 13.5* 11.7*  NEUTROABS 5.2 3.7  HGB 9.7* 8.3*  HCT 27.0* 22.8*  MCV 96.4 95.4  PLT 657* 536*   Cardiac Enzymes: No results for input(s): CKTOTAL, CKMB, CKMBINDEX, TROPONINI in the last 168 hours. BNP (last 3 results) No results for input(s): BNP in the last 8760 hours.  ProBNP (last 3 results) No results for input(s): PROBNP in the last 8760 hours.  CBG:  Recent Labs Lab 02/26/17 0140 02/26/17 0259 02/27/17 0030 02/27/17 0551  GLUCAP 121* 100* 135* 100*    No results found for this or any previous visit (from the past 240 hour(s)).   Studies: Dg Chest 2 View  Result Date: 02/12/2017 CLINICAL DATA:  Leg pain sickle cell crisis EXAM: CHEST  2 VIEW COMPARISON:  12/21/2016 FINDINGS: The heart size and mediastinal contours are within normal limits. Both lungs are clear. The visualized skeletal structures are unremarkable. IMPRESSION: No active cardiopulmonary disease. Electronically Signed   By: Donavan Foil M.D.   On: 02/12/2017 18:39    Scheduled Meds: . enoxaparin (LOVENOX) injection  40 mg Subcutaneous Q24H  . feeding supplement (ENSURE ENLIVE)  237 mL Oral BID BM  . HYDROmorphone   Intravenous Q4H  . ketorolac  15 mg Intravenous Q6H  . oxyCODONE  5 mg Oral Q6H  . senna-docusate  1 tablet Oral BID   Continuous Infusions:  Principal Problem:   Sickle cell pain crisis  (HCC)   In excess of 35 minutes spent during this visit. Greater than 50% involved face to face contact with the patient for assessment, counseling and coordination of care.

## 2017-02-27 NOTE — Progress Notes (Signed)
Patient ID: [name], female   DOB: 1996/10/29, 21 y.o.   MRN: [id number]  Notified by nurse that patient states that she has to leave. Pt advised the nurse that she has to leave and that she does not have to further discuss her personal business.  I went to see patient and speak with her. I advised patient that she is not yet medically stable for discharge. Requested that patient ambulate so that tolerance to activity can be evaluated as she has been in bed for the entire hospital stay. However she refused. She states that she understands her risks but still intends to leave AMA. No prescriptions given.  Lonza Shimabukuro A.

## 2017-02-28 LAB — HEMOGLOBINOPATHY EVALUATION
HGB A2 QUANT: 4.2 % — AB (ref 1.8–3.2)
HGB A: 0 % — AB (ref 96.4–98.8)
HGB C: 0 %
HGB F QUANT: 14.8 % — AB (ref 0.0–2.0)
Hgb S Quant: 81 % — ABNORMAL HIGH
Hgb Variant: 0 %

## 2017-03-08 NOTE — Discharge Summary (Signed)
[name] MRN: [id number] DOB/AGE: 01-23-1996 21 y.o.  Admit date: 02/12/2017 Discharge date: 03/08/2017  Primary Care Physician:  Patient, No Pcp Per   Discharge Diagnoses:   Patient Active Problem List   Diagnosis Date Noted  . Sickle cell crisis ([place]) 02/13/2017  . Homelessness 02/12/2017  . Protein-calorie malnutrition, severe 10/21/2016  . Hypotension 05/15/2016  . Malnutrition of moderate degree 05/15/2016  . Hb-SS disease without crisis ([place])   . Pyelonephritis 05/14/2016  . Hb-SS disease with crisis ([place]) 10/12/2009    DISCHARGE MEDICATION: Medications not reconciled as patient left AGAINST MEDICAL ADVICE     Consults:    SIGNIFICANT DIAGNOSTIC STUDIES:  Dg Chest 2 View  Result Date: 02/12/2017 CLINICAL DATA:  Leg pain sickle cell crisis EXAM: CHEST  2 VIEW COMPARISON:  12/21/2016 FINDINGS: The heart size and mediastinal contours are within normal limits. Both lungs are clear. The visualized skeletal structures are unremarkable. IMPRESSION: No active cardiopulmonary disease. Electronically Signed   By: Donavan Foil M.D.   On: 02/12/2017 18:39       No results found for this or any previous visit (from the past 240 hour(s)).  BRIEF ADMITTING H & P: [name] is a 21 y.o. female with medical history significant of sickle cell anemia, asthma, GERD, and homelessness presenting with typical pain crisis.  She reports pain in her arms and legs which started last night.  Additional history is unable to be obtained because the patient is too somnolent from the pain medications to effectively answer questions.  HPI per ER: [name] is a 21 y.o. female who presents emergency Department with chief complaint of sickle cell crisis. The patient complains of bilateral lower extremity pain. She states that her pain began this morning. Patient states that she has not been on any narcotics for the past week. She does not currently have a primary  care provider. She states that last night she did run a fever up to 101, but denies any urinary symptoms, cough, chest pain, shortness of breath. The patient is noted to be icteric, but states that that is her baseline. She denies nausea or vomiting. She denies rashes or sores.    Hospital Course:  Present on Admission: . (Resolved) Sickle cell crisis ([place]) . Hb-SS disease with crisis ([place]) . Hyperbilirubinemia . Leukocytosis . Tobacco abuse . Sickle cell crisis [facility])  Patient was admitted with sickle cell crisis. Sugar pain was managed with Dilaudid via PCA, Toradol and IV fluids. Patient is still having significant pain. She been transitioned to oral medications and nurses inform you the patient left AGAINST MEDICAL ADVICE.  Disposition and Follow-up: patient left AGAINST MEDICAL ADVICE   DISCHARGE EXAM: no examination performed patient left AGAINST MEDICAL ADVICE  Blood pressure 102/60, pulse 82, temperature 98.3 F (36.8 C), temperature source Oral, resp. rate 17, height 5\' 2"  (1.575 m), weight 49.9 kg (110 lb), last menstrual period 01/28/2017, SpO2 94 %.  No results for input(s): NA, K, CL, CO2, GLUCOSE, BUN, CREATININE, CALCIUM, MG, PHOS in the last 72 hours. No results for input(s): AST, ALT, ALKPHOS, BILITOT, PROT, ALBUMIN in the last 72 hours. No results for input(s): LIPASE, AMYLASE in the last 72 hours. No results for input(s): WBC, NEUTROABS, HGB, HCT, MCV, PLT in the last 72 hours.   Total time spent including face to face and decision making was greater than 30 minutes  Signed: Aziah Kaiser A. 03/08/2017, 11:49 AM

## 2017-03-08 NOTE — Discharge Summary (Signed)
[name] MRN: [id number] DOB/AGE: May 29, 1996 21 y.o.  Admit date: 02/25/2017 Discharge date: 03/08/2017  Primary Care Physician:  Patient, No Pcp Per   Discharge Diagnoses:   Patient Active Problem List   Diagnosis Date Noted  . Sickle cell crisis ([place]) 02/13/2017  . Homelessness 02/12/2017  . Protein-calorie malnutrition, severe 10/21/2016  . Vasoocclusive sickle cell crisis ([place])   . Sickle cell anemia with crisis ([place]) 02/25/2015  . Tobacco abuse   . 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   naproxen 375 MG tablet Commonly known as:  NAPROSYN Take 1 tablet (375 mg total) by mouth 2 (two) times daily.      Medications not reconciled the patient left AGAINST MEDICAL ADVICE   Consults: Treatment Team:  Ambrose Finland, MD   SIGNIFICANT DIAGNOSTIC STUDIES:  Dg Chest 2 View  Result Date: 02/12/2017 CLINICAL DATA:  Leg pain sickle cell crisis EXAM: CHEST  2 VIEW COMPARISON:  12/21/2016 FINDINGS: The heart size and mediastinal contours are within normal limits. Both lungs are clear. The visualized skeletal structures are unremarkable. IMPRESSION: No active cardiopulmonary disease. Electronically Signed   By: Donavan Foil M.D.   On: 02/12/2017 18:39    No results found for this or any previous visit (from the past 240 hour(s)).  BRIEF ADMITTING H & P: [name] is a 21 y.o. female with history of sickle cell anemia presents to the ER because of increasing pain in the upper and lower extremity over the last 24 hours. Denies any chest pain headache visual symptoms or focal deficits.   ED Course: In the ER patient was given multiple doses of Dilaudid and also was given Toradol despite which patient was  still having pain and pain admitted for sickle cell pain crisis. Hemoglobin is at baseline. Patient also was hypoglycemic which improved with eating. Urine drug screen was positive for cocaine.   Hospital Course:  Present on Admission: . Sickle cell pain crisis [facility])  Physical patient with hemoglobin SS who was admitted for sickle cell crisis. The patient has chronic homelessness and is not followed on a regular basis by physician. The patient was placed on a Dilaudid PCA on admission. However she has very little use of the PCA. She was transitioned to her oral medications. I suspect that the patient has some underlying psychiatric issues and psychiatry was consult to see the patient however she refused to engage with psychiatry. The patient was sleeping most of the time during her hospitalization but did complain of a significant headache. In the absence of any knowledge of her intracranial vasculature and MRA of her head was ordered before attempting to treat her headache with any medications that with the vasoconstrictive nature. The patient however decided that she was leaving and was not to be reasonable risk. The patient signed out [place] stating that pain was the least of her concerns. She did however say that she was still having a significant headache. I implored the patient states though that we may get the information needed to treat her headache appropriately however she left AGAINST MEDICAL ADVICE. Please refer to my evaluation from earlier that day for the patient's physical condition on the day of  discharge.  Disposition and Follow-up: Patient left AGAINST MEDICAL ADVICE   DISCHARGE EXAM: not performed as patient left AGAINST MEDICAL ADVICE  Blood pressure (!) 95/45, pulse 63, temperature 97.9 F (36.6 C), temperature source Oral, resp. rate (!) 21, weight 51.6 kg (113 lb 12.1 oz), last menstrual period 01/28/2017, SpO2 95 %.  No results for input(s): NA, K, CL, CO2,  GLUCOSE, BUN, CREATININE, CALCIUM, MG, PHOS in the last 72 hours. No results for input(s): AST, ALT, ALKPHOS, BILITOT, PROT, ALBUMIN in the last 72 hours. No results for input(s): LIPASE, AMYLASE in the last 72 hours. No results for input(s): WBC, NEUTROABS, HGB, HCT, MCV, PLT in the last 72 hours.   Total time spent including face to face and decision making was less than than 30 minutes  Signed: MATTHEWS,MICHELLE A. 03/08/2017, 11:41 AM

## 2017-03-15 ENCOUNTER — Ambulatory Visit: Payer: Medicare Other | Admitting: Family Medicine

## 2017-04-06 ENCOUNTER — Telehealth: Payer: Self-pay | Admitting: General Practice

## 2017-04-13 ENCOUNTER — Ambulatory Visit: Payer: Medicare Other | Admitting: Family Medicine

## 2017-04-18 ENCOUNTER — Encounter (HOSPITAL_COMMUNITY): Payer: Self-pay | Admitting: Obstetrics and Gynecology

## 2017-04-18 ENCOUNTER — Inpatient Hospital Stay (HOSPITAL_COMMUNITY)
Admission: EM | Admit: 2017-04-18 | Discharge: 2017-04-20 | DRG: 811 | Disposition: A | Discharge status: Home / Self Care | Payer: Medicare Other | Attending: Internal Medicine | Admitting: Internal Medicine

## 2017-04-18 ENCOUNTER — Emergency Department (HOSPITAL_COMMUNITY): Payer: Medicare Other

## 2017-04-18 DIAGNOSIS — G43909 Migraine, unspecified, not intractable, without status migrainosus: Secondary | ICD-10-CM | POA: Diagnosis present

## 2017-04-18 DIAGNOSIS — D5700 Hb-SS disease with crisis, unspecified: Principal | ICD-10-CM | POA: Diagnosis present

## 2017-04-18 DIAGNOSIS — R51 Headache: Secondary | ICD-10-CM

## 2017-04-18 DIAGNOSIS — J4520 Mild intermittent asthma, uncomplicated: Secondary | ICD-10-CM | POA: Diagnosis present

## 2017-04-18 DIAGNOSIS — D72829 Elevated white blood cell count, unspecified: Secondary | ICD-10-CM | POA: Diagnosis not present

## 2017-04-18 DIAGNOSIS — E43 Unspecified severe protein-calorie malnutrition: Secondary | ICD-10-CM | POA: Diagnosis present

## 2017-04-18 DIAGNOSIS — F1490 Cocaine use, unspecified, uncomplicated: Secondary | ICD-10-CM | POA: Diagnosis present

## 2017-04-18 DIAGNOSIS — F1410 Cocaine abuse, uncomplicated: Secondary | ICD-10-CM | POA: Diagnosis not present

## 2017-04-18 DIAGNOSIS — Z720 Tobacco use: Secondary | ICD-10-CM | POA: Diagnosis not present

## 2017-04-18 DIAGNOSIS — Z590 Homelessness: Secondary | ICD-10-CM

## 2017-04-18 DIAGNOSIS — D638 Anemia in other chronic diseases classified elsewhere: Secondary | ICD-10-CM | POA: Diagnosis not present

## 2017-04-18 DIAGNOSIS — F17200 Nicotine dependence, unspecified, uncomplicated: Secondary | ICD-10-CM | POA: Diagnosis present

## 2017-04-18 DIAGNOSIS — F17210 Nicotine dependence, cigarettes, uncomplicated: Secondary | ICD-10-CM | POA: Diagnosis present

## 2017-04-18 LAB — COMPREHENSIVE METABOLIC PANEL
ALBUMIN: 3.8 g/dL (ref 3.5–5.0)
ALT: 29 U/L (ref 14–54)
ANION GAP: 5 (ref 5–15)
AST: 44 U/L — ABNORMAL HIGH (ref 15–41)
Alkaline Phosphatase: 67 U/L (ref 38–126)
BILIRUBIN TOTAL: 4.6 mg/dL — AB (ref 0.3–1.2)
BUN: 10 mg/dL (ref 6–20)
CALCIUM: 9 mg/dL (ref 8.9–10.3)
CO2: 26 mmol/L (ref 22–32)
Chloride: 109 mmol/L (ref 101–111)
Creatinine, Ser: 0.74 mg/dL (ref 0.44–1.00)
GFR calc non Af Amer: >60 mL/min (ref >60)
Glucose, Bld: 97 mg/dL (ref 65–99)
POTASSIUM: 4.4 mmol/L (ref 3.5–5.1)
Sodium: 140 mmol/L (ref 135–145)
Total Protein: 7 g/dL (ref 6.5–8.1)

## 2017-04-18 LAB — CBC WITH DIFFERENTIAL/PLATELET
BASOS ABS: 0 10*3/uL (ref 0.0–0.1)
BASOS PCT: 0 %
EOS ABS: 0.1 10*3/uL (ref 0.0–0.7)
Eosinophils Relative: 1 %
HEMATOCRIT: 20.2 % — AB (ref 36.0–46.0)
HEMOGLOBIN: 7.4 g/dL — AB (ref 12.0–15.0)
LYMPHS ABS: 4.1 10*3/uL — AB (ref 0.7–4.0)
LYMPHS PCT: 35 %
MCH: 34.4 pg — AB (ref 26.0–34.0)
MCHC: 36.6 g/dL — AB (ref 30.0–36.0)
MCV: 94 fL (ref 78.0–100.0)
MONOS PCT: 8 %
Monocytes Absolute: 0.9 10*3/uL (ref 0.1–1.0)
NEUTROS ABS: 6.7 10*3/uL (ref 1.7–7.7)
Neutrophils Relative %: 56 %
Platelets: 627 10*3/uL — ABNORMAL HIGH (ref 150–400)
RBC: 2.15 MIL/uL — ABNORMAL LOW (ref 3.87–5.11)
RDW: 18.2 % — AB (ref 11.5–15.5)
WBC: 11.8 10*3/uL — ABNORMAL HIGH (ref 4.0–10.5)

## 2017-04-18 LAB — RETICULOCYTES
RBC.: 2.15 MIL/uL — AB (ref 3.87–5.11)
RETIC COUNT ABSOLUTE: 309.6 10*3/uL — AB (ref 19.0–186.0)
RETIC CT PCT: 14.4 % — AB (ref 0.4–3.1)

## 2017-04-18 LAB — I-STAT BETA HCG BLOOD, ED (MC, WL, AP ONLY)

## 2017-04-18 MED ORDER — HYDROMORPHONE HCL 1 MG/ML IJ SOLN
1.0000 mg | INTRAMUSCULAR | Status: AC
  Filled 2017-04-18: qty 1

## 2017-04-18 MED ORDER — HYDROMORPHONE HCL 1 MG/ML IJ SOLN: 0.5000 mg | INTRAMUSCULAR | Status: AC

## 2017-04-18 MED ORDER — PROCHLORPERAZINE EDISYLATE 5 MG/ML IJ SOLN
10.0000 mg | Freq: Once | INTRAMUSCULAR | Status: AC
  Administered 2017-04-18: 10 mg via INTRAVENOUS
  Filled 2017-04-18: qty 2

## 2017-04-18 MED ORDER — IOPAMIDOL (ISOVUE-370) INJECTION 76%
100.0000 mL | Freq: Once | INTRAVENOUS | Status: AC | PRN
  Administered 2017-04-18: 100 mL via INTRAVENOUS

## 2017-04-18 MED ORDER — HYDROMORPHONE HCL 1 MG/ML IJ SOLN: 1.0000 mg | INTRAMUSCULAR | Status: AC

## 2017-04-18 MED ORDER — HYDROMORPHONE HCL 1 MG/ML IJ SOLN
0.5000 mg | INTRAMUSCULAR | Status: AC
  Administered 2017-04-18: 0.5 mg via INTRAVENOUS
  Filled 2017-04-18: qty 1

## 2017-04-18 MED ORDER — ONDANSETRON HCL 4 MG/2ML IJ SOLN
4.0000 mg | INTRAMUSCULAR | Status: DC | PRN
  Administered 2017-04-18: 4 mg via INTRAVENOUS
  Filled 2017-04-18 (×2): qty 2

## 2017-04-18 MED ORDER — DIPHENHYDRAMINE HCL 50 MG/ML IJ SOLN
25.0000 mg | Freq: Once | INTRAMUSCULAR | Status: AC
  Administered 2017-04-18: 25 mg via INTRAVENOUS
  Filled 2017-04-18: qty 1

## 2017-04-18 MED ORDER — DEXTROSE-NACL 5-0.45 % IV SOLN
INTRAVENOUS | Status: DC
  Administered 2017-04-18: 17:00:00 via INTRAVENOUS

## 2017-04-18 MED ORDER — IOPAMIDOL (ISOVUE-370) INJECTION 76%
INTRAVENOUS | Status: AC
  Filled 2017-04-18: qty 100

## 2017-04-18 NOTE — H&P (Signed)
[name]:[id number] DOB: 02-23-1996 DOA: 04/18/2017     PCP: Patient, No Pcp Per   Outpatient Specialists NONE Patient coming from:    home Lives   Family( grandma)     Chief Complaint: Pain in arm and head because of sickle cell crisis  HPI: [name] is a 21 y.o. female with medical history significant of sickle cell, tobacco abuse and homelessness with failure to follow-up    Presented with current headaches. Describes headache as moderate and throbbing similar to usual headaches that she's been getting. But she also have had some aching and throbbing right side arm pain with similar to pain crisis is not associated chest pain shortness of breath no cough no fevers or chills no neck stiffness she had had prior admission with headache associated sickle cell crisis in May at that time MRI of the head was attempted but patient left AMA. Right to being fully evaluated. Patient have had recurrent episodes of admissions and leaving AMA Patient is not on any pain medications at home.  Patient is not answering questions and turned away from the examiner. She states her shoulders hurt.  Regarding pertinent Chronic problems: Sickle cell disease complicated by homelessness inability to follow-up   IN ER:  Temp (24hrs), Avg:98 F (36.7 C), Min:98 F (36.7 C), Max:98 F (36.7 C)      on arrival  ED Triage Vitals  Enc Vitals Group     BP 04/18/17 1647 (!) 103/50     Pulse Rate 04/18/17 1647 84     Resp 04/18/17 1647 20     Temp 04/18/17 1843 98 F (36.7 C)     Temp Source 04/18/17 1843 Oral     SpO2 04/18/17 1647 98 %     Weight --      Height --      Head Circumference --      Peak Flow --      Pain Score 04/18/17 1557 7     Pain Loc --      Pain Edu? --      Excl. in [place]? --   HR 69 BP 94/53 WBC 11.8 hemoglobin 7.4 which is down from baseline of 8.3 platelets 627 CR 0.74 total bili 4.6 Tick percent 14.4  CT angio  head non-acute Following  Medications were ordered in ER: Medications  dextrose 5 %-0.45 % sodium chloride infusion ( Intravenous New Bag/Given 04/18/17 1644)  HYDROmorphone (DILAUDID) injection 1 mg (1 mg Intravenous Not Given 04/18/17 1739)    Or  HYDROmorphone (DILAUDID) injection 1 mg ( Subcutaneous See Alternative 04/18/17 1739)  HYDROmorphone (DILAUDID) injection 1 mg (1 mg Intravenous Not Given 04/18/17 1843)    Or  HYDROmorphone (DILAUDID) injection 1 mg ( Subcutaneous See Alternative 04/18/17 1843)  HYDROmorphone (DILAUDID) injection 1 mg (not administered)    Or  HYDROmorphone (DILAUDID) injection 1 mg (not administered)  ondansetron (ZOFRAN) injection 4 mg (4 mg Intravenous Given 04/18/17 1646)  iopamidol (ISOVUE-370) 76 % injection (not administered)  HYDROmorphone (DILAUDID) injection 0.5 mg (0.5 mg Intravenous Given 04/18/17 1646)    Or  HYDROmorphone (DILAUDID) injection 0.5 mg ( Subcutaneous See Alternative 04/18/17 1646)  diphenhydrAMINE (BENADRYL) injection 25 mg (25 mg Intravenous Given 04/18/17 1645)  prochlorperazine (COMPAZINE) injection 10 mg (10 mg Intravenous Given 04/18/17 1705)  iopamidol (ISOVUE-370) 76 % injection 100 mL (100 mLs Intravenous Contrast Given 04/18/17 1858)     Hospitalist was called for admission for sickle cell crisis  Review of Systems:    Pertinent positives include: right arm pain, headache  Constitutional:  No weight loss, night sweats, Fevers, chills, fatigue, weight loss  HEENT:  No headaches, Difficulty swallowing,Tooth/dental problems,Sore throat,  No sneezing, itching, ear ache, nasal congestion, post nasal drip,  Cardio-vascular:  No chest pain, Orthopnea, PND, anasarca, dizziness, palpitations.no Bilateral lower extremity swelling  GI:  No heartburn, indigestion, abdominal pain, nausea, vomiting, diarrhea, change in bowel habits, loss of appetite, melena, blood in stool, hematemesis Resp:  no shortness of breath at rest. No dyspnea on exertion, No excess  mucus, no productive cough, No non-productive cough, No coughing up of blood.No change in color of mucus.No wheezing. Skin:  no rash or lesions. No jaundice GU:  no dysuria, change in color of urine, no urgency or frequency. No straining to urinate.  No flank pain.  Musculoskeletal:   No decreased range of motion. No back pain.  Psych:  No change in mood or affect. No depression or anxiety. No memory loss.  Neuro: no localizing neurological complaints, no tingling, no weakness, no double vision, no gait abnormality, no slurred speech, no confusion  As per HPI otherwise 10 point review of systems negative.   Past Medical History: Past Medical History:  Diagnosis Date  . Acute kidney injury ([place]) 05/15/2016  . Amenorrhea   . Anemia    SICKLE CELL  . Asthma   . Drug-induced pruritus 02/26/2015  . GERD (gastroesophageal reflux disease)   . Headache   . Hyperbilirubinemia 02/26/2015  . Sickle cell disease ([place])    Past Surgical History:  Procedure Laterality Date  . 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 TONSILLECTOMY     Age 89     Social History:  Ambulatory  independently     reports that she has been smoking Cigarettes.  She has been smoking about 0.50 packs per day. She has never used smokeless tobacco. She reports that she uses drugs, including Marijuana and Cocaine. She reports that she does not drink alcohol.  Allergies:  No Known Allergies     Family History:  Family History  Problem Relation Age of Onset  . Hypertension Paternal Grandfather   . Sickle cell trait Father   . Cancer Mother        Died in 25-Feb-2009    Medications: Prior to Admission medications   Medication Sig Start Date End Date Taking? Authorizing Provider  folic acid (FOLVITE) 1 MG tablet Take 1 tablet (1 mg total) by mouth daily. Patient not taking: Reported on 08/07/2016 03/11/15   Vivi Barrack, MD  naproxen (NAPROSYN) 375 MG tablet Take 1 tablet (375 mg total) by mouth 2 (two)  times daily. Patient not taking: Reported on 10/19/2016 08/07/16   Margarita Mail, PA-C    Physical Exam: Patient Vitals for the past 24 hrs:  BP Temp Temp src Pulse Resp SpO2  04/18/17 1845 (!) 94/53 - - 69 - 97 %  04/18/17 1843 (!) 94/53 98 F (36.7 C) Oral 65 18 98 %  04/18/17 1738 108/64 - - 67 18 96 %  04/18/17 1647 (!) 103/50 - - 84 20 98 %    1. General:  in No Acute distress 2. Psychological:lethargic but oriented  3. Head/ENT:    Dry Mucous Membranes                          Head Non traumatic, neck supple  Poor Dentition 4. SKIN:    decreased Skin turgor,  Skin clean Dry and intact no rash 5. Heart: Regular rate and rhythm no Murmur, Rub or gallop 6. Lungs: no wheezes or crackles   7. Abdomen: Soft,  non-tender, Non distended 8. Lower extremities: no clubbing, cyanosis, or edema 9. Neurologically Grossly intact, moving all 4 extremities equally   10. MSK: Normal range of motion   body mass index is unknown because there is no height or weight on file.  Labs on Admission:   Labs on Admission: I have personally reviewed following labs and imaging studies  CBC:  Recent Labs Lab 04/18/17 1640  WBC 11.8*  NEUTROABS 6.7  HGB 7.4*  HCT 20.2*  MCV 94.0  PLT 053*   Basic Metabolic Panel:  Recent Labs Lab 04/18/17 1640  NA 140  K 4.4  CL 109  CO2 26  GLUCOSE 97  BUN 10  CREATININE 0.74  CALCIUM 9.0   GFR: CrCl cannot be calculated (Unknown ideal weight.). Liver Function Tests:  Recent Labs Lab 04/18/17 1640  AST 44*  ALT 29  ALKPHOS 67  BILITOT 4.6*  PROT 7.0  ALBUMIN 3.8   No results for input(s): LIPASE, AMYLASE in the last 168 hours. No results for input(s): AMMONIA in the last 168 hours. Coagulation Profile: No results for input(s): INR, PROTIME in the last 168 hours. Cardiac Enzymes: No results for input(s): CKTOTAL, CKMB, CKMBINDEX, TROPONINI in the last 168 hours. BNP (last 3 results) No results for  input(s): PROBNP in the last 8760 hours. HbA1C: No results for input(s): HGBA1C in the last 72 hours. CBG: No results for input(s): GLUCAP in the last 168 hours. Lipid Profile: No results for input(s): CHOL, HDL, LDLCALC, TRIG, CHOLHDL, LDLDIRECT in the last 72 hours. Thyroid Function Tests: No results for input(s): TSH, T4TOTAL, FREET4, T3FREE, THYROIDAB in the last 72 hours. Anemia Panel:  Recent Labs  04/18/17 1640  RETICCTPCT 14.4*   Urine analysis:  Sepsis Labs: @LABRCNTIP (procalcitonin:4,lacticidven:4) )No results found for this or any previous visit (from the past 240 hour(s)).    UA not ordered  No results found for: HGBA1C  CrCl cannot be calculated (Unknown ideal weight.).  BNP (last 3 results) No results for input(s): PROBNP in the last 8760 hours.   ECG REPORT  not ordered  There were no vitals filed for this visit.   Cultures:    Component Value Date/Time   SDES BLOOD LEFT WRIST 12/21/2016 2030   SPECREQUEST IN PEDIATRIC BOTTLE 3CC 12/21/2016 2030   CULT NO GROWTH 5 DAYS 12/21/2016 2030   REPTSTATUS 12/26/2016 FINAL 12/21/2016 2030     Radiological Exams on Admission: Ct Angio Head W Or Wo Contrast  Result Date: 04/18/2017 CLINICAL DATA:  Arm and head pain, attributed to sickle cell crisis. Unresponsive. EXAM: CT ANGIOGRAPHY HEAD TECHNIQUE: Multidetector CT imaging of the head was performed using the standard protocol during bolus administration of intravenous contrast. Multiplanar CT image reconstructions and MIPs were obtained to evaluate the vascular anatomy. CONTRAST:  100 cc Isovue 370 COMPARISON:  CT HEAD October 19, 2016. FINDINGS: CT HEAD BRAIN: No intraparenchymal hemorrhage, mass effect nor midline shift. The ventricles and sulci are normal. No acute large vascular territory infarcts. No abnormal extra-axial fluid collections. Basal cisterns are patent. VASCULAR: Unremarkable. SKULL/SOFT TISSUES: No skull fracture. No significant soft tissue  swelling. ORBITS/SINUSES: The included ocular globes and orbital contents are normal.The mastoid aircells and included paranasal sinuses are well-aerated. OTHER: None. CTA HEAD ANTERIOR CIRCULATION:  Patent included cervical, petrous, cavernous and supra clinoid internal carotid arteries. Widely patent anterior communicating artery. 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 Patent anterior and middle cerebral arteries. No large vessel occlusion, hemodynamically significant stenosis, contrast extravasation. POSTERIOR CIRCULATION: LEFT vertebral artery is dominant with patent vertebral arteries, vertebrobasilar junction and basilar artery, as well as main branch vessels. Patent posterior cerebral arteries. Robust bilateral posterior communicating arteries present. No large vessel occlusion, hemodynamically significant stenosis, contrast extravasation or aneurysm. VENOUS SINUSES: Major dural venous sinuses are patent though not tailored for evaluation on this angiographic examination. ANATOMIC VARIANTS: None. DELAYED PHASE: No abnormal intracranial enhancement. IMPRESSION: CT HEAD: Normal. CTA HEAD: No emergent large vessel or significant stenosis. 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 Electronically Signed   By: Elon Alas M.D.   On: 04/18/2017 19:34    Chart has been reviewed    Assessment/Plan   21 y.o. female with medical history significant of sickle cell, tobacco abuse and homelessness with failure to follow-up Admitted  For sickle cell crisis Present on Admission:  . Sickle cell crisis ([place])- patient not narcotics at baseline. Last time she was admitted she had not used her PCA. Patient is sedated with Dilaudid that was given in emergency department. Will treat pain with Toradol for now. - will admit per sickle cell protocol,    control pain,    hydrate with IVF D5 .45% Saline @ 100 mls/hour,    continue  folic acid   Transfuse as  needed if Hg drops significantly below baseline.    No evidence of acute chest at this time   Sickle cell team to take over management in AM  . Leukocytosis- evaluate for  possible source of infection, patient denies any coughs to suggest pulmonary infection no fevers or chills, no stool likely  due to sickle cell crisis . Headache - patient is currently not endorsing any headache, CTA of the head showed no acute abnormalities there is a small aneurysms needs to be followed up as an outpatient . Tobacco abuse - nurse to provide smoking cessation counseling order nicotine patch  Other plan as per orders.  DVT prophylaxis:   Lovenox     Code Status:  FULL CODE  as per patient    Family Communication:   Family not  at  Bedside    Disposition Plan:     To home once workup is complete and patient is stable                              Consults called: none  Admission status:   inpatient      Level of care    medical floor       I have spent a total of 56 min on this admission  extra time was spent to discuss case with consultants  Zitlali Primm 04/18/2017, 10:20 PM    [facility]  Pager [phone]   after 2 AM please page floor coverage PA If 7AM-7PM, please contact the day team taking care of the patient  [email or web address]  Password [email or web address]

## 2017-04-18 NOTE — ED Provider Notes (Signed)
[place] DEPT Provider Note   CSN: [id number] Arrival date & time: 04/18/17  1549     History   Chief Complaint Chief Complaint  Patient presents with  . Sickle Cell Pain Crisis    HPI [name] is a 21 y.o. female.  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 Pt states her sx started yesterday as a moderate aching, throbbing headache that radiates from her eyes to her neck. The pain is similar to her usual headaches and migraines. She has also developed aching, throbbing, right-sided arm pain since then that is similar to her pain crises. No CP, SOB, or cough. No sputum production. No fever or chills. No neck stiffness. No vision changes, numbness, or weakness. She has not taken any meds at home.  Past Medical History:  Diagnosis Date  . Acute kidney injury ([place]) 05/15/2016  . Amenorrhea   . Anemia    SICKLE CELL  . Asthma   . Drug-induced pruritus 02/26/2015  . GERD (gastroesophageal reflux disease)   . Headache   . Hyperbilirubinemia 02/26/2015  . Sickle cell disease [facility])     Patient Active Problem List   Diagnosis Date Noted  . Headache 04/18/2017  . Sickle cell crisis ([place]) 02/13/2017  . Homelessness 02/12/2017  . Sinus tachycardia 12/21/2016  . Strep throat 12/21/2016  . Protein-calorie malnutrition, severe 10/21/2016  . Sepsis ([place])   . Anemia due to other cause   . Acute kidney injury ([place]) 05/15/2016  . Hypotension 05/15/2016  . Malnutrition of moderate degree 05/15/2016  . Gram-negative sepsis with organ dysfunction ([place])   . Acute pyelonephritis   . Hb-SS disease without crisis ([place])   . Pyelonephritis 05/14/2016  . Imprisonment and other incarceration   . Bibasilar crackles   . Hereditary hemolytic anemia ([place])   . 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 Suicide ideation 03/11/2015  . Psychomotor agitation 03/11/2015  .  Sickle cell anemia with pain ([place]) 03/10/2015  . Vasoocclusive sickle cell crisis ([place])   . Hyperbilirubinemia 02/26/2015  . Drug-induced pruritus 02/26/2015  . Sickle cell anemia with crisis ([place]) 02/25/2015  . Tobacco abuse   . Leukocytosis   . Hematochezia 02/27/2014  . UTI (urinary tract infection) 02/26/2014  . Vaginal discharge 02/24/2014  . Sickle cell pain crisis ([place]) 11/23/2013  . Facial rash 03/25/2013  . Birth control counseling 08/14/2012  . Fever 09/20/2011  . Asthma, mild intermittent 12/14/2010  . Hb-SS disease with crisis ([place]) 10/12/2009    Past Surgical History:  Procedure Laterality Date  . 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 TONSILLECTOMY     Age 73    OB History    No data available       Home Medications    Prior to Admission medications   Medication Sig Start Date End Date Taking? Authorizing Provider  folic acid (FOLVITE) 1 MG tablet Take 1 tablet (1 mg total) by mouth daily. Patient not taking: Reported on 08/07/2016 03/11/15   Vivi Barrack, MD  naproxen (NAPROSYN) 375 MG tablet Take 1 tablet (375 mg total) by mouth 2 (two) times daily. Patient not taking: Reported on 10/19/2016 08/07/16   Margarita Mail, PA-C    Family History Family History  Problem Relation Age of Onset  . Hypertension Paternal Grandfather   . Sickle cell trait Father   . Cancer Mother  Died in 2010    Social History Social History  Substance Use Topics  . Smoking status: Current Every Day Smoker    Packs/day: 0.50    Types: Cigarettes  . Smokeless tobacco: Never Used  . Alcohol use No     Comment: Denies 06/12/2014     Allergies   Patient has no known allergies.   Review of Systems Review of Systems  Constitutional: Positive for fatigue.  Musculoskeletal: Positive for arthralgias.  Neurological: Positive for headaches.  All other systems reviewed and are negative.    Physical Exam Updated Vital Signs BP (!) 94/51   Pulse 80    Temp 99.5 F (37.5 C) (Oral)   Resp (!) 22   SpO2 95%   Physical Exam  Constitutional: She is oriented to person, place, and time. She appears well-developed and well-nourished. No distress.  HENT:  Head: Normocephalic and atraumatic.  Eyes: Conjunctivae are normal.  Neck: Neck supple.  Cardiovascular: Normal rate, regular rhythm and normal heart sounds.  Exam reveals no friction rub.   No murmur heard. Pulmonary/Chest: Effort normal and breath sounds normal. No respiratory distress. She has no wheezes. She has no rales.  Abdominal: She exhibits no distension.  Musculoskeletal: She exhibits no edema.  Moderate TTP over right arm, diffusely, with no bruising or deformity  Neurological: She is alert and oriented to person, place, and time. She exhibits normal muscle tone.  Skin: Skin is warm. Capillary refill takes less than 2 seconds.  Psychiatric: She has a normal mood and affect.  Nursing note and vitals reviewed.   Neurological Exam:  Mental Status: Alert and oriented to person, place, and time. Attention and concentration normal. Speech clear. Recent memory is intact. Cranial Nerves: Visual fields grossly intact. EOMI and PERRLA. No nystagmus noted. Facial sensation intact at forehead, maxillary cheek, and chin/mandible bilaterally. No facial asymmetry or weakness. Hearing grossly normal. Uvula is midline, and palate elevates symmetrically. Normal SCM and trapezius strength. Tongue midline without fasciculations. Motor: Muscle strength 5/5 in proximal and distal UE and LE bilaterally. No pronator drift. Muscle tone normal. Reflexes: 2+ and symmetrical in all four extremities.  Sensation: Intact to light touch in upper and lower extremities distally bilaterally.  Gait: Normal without ataxia. Coordination: Normal FTN bilaterally.    ED Treatments / Results  Labs (all labs ordered are listed, but only abnormal results are displayed) Labs Reviewed  CBC WITH DIFFERENTIAL/PLATELET  - Abnormal; Notable for the following:       Result Value   WBC 11.8 (*)    RBC 2.15 (*)    Hemoglobin 7.4 (*)    HCT 20.2 (*)    MCH 34.4 (*)    MCHC 36.6 (*)    RDW 18.2 (*)    Platelets 627 (*)    Lymphs Abs 4.1 (*)    All other components within normal limits  COMPREHENSIVE METABOLIC PANEL - Abnormal; Notable for the following:    AST 44 (*)    Total Bilirubin 4.6 (*)    All other components within normal limits  RETICULOCYTES - Abnormal; Notable for the following:    Retic Ct Pct 14.4 (*)    RBC. 2.15 (*)    Retic Count, Absolute 309.6 (*)    All other components within normal limits  I-STAT BETA HCG BLOOD, ED ([facility], [facility], [facility] ONLY)    EKG  EKG Interpretation None       Radiology Ct Angio Head W Or Wo Contrast  Result Date: 04/18/2017  CLINICAL DATA:  Arm and head pain, attributed to sickle cell crisis. Unresponsive. EXAM: CT ANGIOGRAPHY HEAD TECHNIQUE: Multidetector CT imaging of the head was performed using the standard protocol during bolus administration of intravenous contrast. Multiplanar CT image reconstructions and MIPs were obtained to evaluate the vascular anatomy. CONTRAST:  100 cc Isovue 370 COMPARISON:  CT HEAD October 19, 2016. FINDINGS: CT HEAD BRAIN: No intraparenchymal hemorrhage, mass effect nor midline shift. The ventricles and sulci are normal. No acute large vascular territory infarcts. No abnormal extra-axial fluid collections. Basal cisterns are patent. VASCULAR: Unremarkable. SKULL/SOFT TISSUES: No skull fracture. No significant soft tissue swelling. ORBITS/SINUSES: The included ocular globes and orbital contents are normal.The mastoid aircells and included paranasal sinuses are well-aerated. OTHER: None. CTA HEAD ANTERIOR CIRCULATION: Patent included cervical, petrous, cavernous and supra clinoid internal carotid arteries. Widely patent anterior communicating artery. 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 Patent anterior and middle cerebral arteries. No large vessel occlusion, hemodynamically significant stenosis, contrast extravasation. POSTERIOR CIRCULATION: LEFT vertebral artery is dominant with patent vertebral arteries, vertebrobasilar junction and basilar artery, as well as main branch vessels. Patent posterior cerebral arteries. Robust bilateral posterior communicating arteries present. No large vessel occlusion, hemodynamically significant stenosis, contrast extravasation or aneurysm. VENOUS SINUSES: Major dural venous sinuses are patent though not tailored for evaluation on this angiographic examination. ANATOMIC VARIANTS: None. DELAYED PHASE: No abnormal intracranial enhancement. IMPRESSION: CT HEAD: Normal. CTA HEAD: No emergent large vessel or significant stenosis. 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 Electronically Signed   By: Elon Alas M.D.   On: 04/18/2017 19:34    Procedures Procedures (including critical care time)  Medications Ordered in ED Medications  dextrose 5 %-0.45 % sodium chloride infusion ( Intravenous New Bag/Given 04/18/17 1644)  HYDROmorphone (DILAUDID) injection 1 mg (1 mg Intravenous Not Given 04/18/17 1739)    Or  HYDROmorphone (DILAUDID) injection 1 mg ( Subcutaneous See Alternative 04/18/17 1739)  HYDROmorphone (DILAUDID) injection 1 mg (1 mg Intravenous Not Given 04/18/17 1843)    Or  HYDROmorphone (DILAUDID) injection 1 mg ( Subcutaneous See Alternative 04/18/17 1843)  HYDROmorphone (DILAUDID) injection 1 mg (1 mg Intravenous Not Given 04/18/17 2045)    Or  HYDROmorphone (DILAUDID) injection 1 mg ( Subcutaneous See Alternative 04/18/17 2045)  ondansetron (ZOFRAN) injection 4 mg (4 mg Intravenous Given 04/18/17 1646)  iopamidol (ISOVUE-370) 76 % injection (not administered)  HYDROmorphone (DILAUDID) injection 0.5 mg (0.5 mg Intravenous Given 04/18/17 1646)    Or  HYDROmorphone (DILAUDID) injection 0.5 mg ( Subcutaneous See Alternative 04/18/17 1646)    diphenhydrAMINE (BENADRYL) injection 25 mg (25 mg Intravenous Given 04/18/17 1645)  prochlorperazine (COMPAZINE) injection 10 mg (10 mg Intravenous Given 04/18/17 1705)  iopamidol (ISOVUE-370) 76 % injection 100 mL (100 mLs Intravenous Contrast Given 04/18/17 1858)     Initial Impression / Assessment and Plan / ED Course  I have reviewed the triage vital signs and the nursing notes.  Pertinent labs & imaging results that were available during my care of the patient were reviewed by me and considered in my medical decision making (see chart for details).     21 yo F here with worsening arm pain, headache in setting of sickle cell disease. No CP, SOB, or signs of acute chest. Regarding her HA, she does have h/o migraines but per review of prior records, imaging desired to r/o underlying vascular dysplasia. She left AMA prior to MRA in the past. Will obtain CT angio.  No signs of stroke, neuro exam is non-focal.   CT shows small, <1 mm aneurysms - doubt its causing her HA. HA improved with migraine meds but persistent arm pain. She does not feel comfortable managing at home. Will admit for IVF, pain control.  Final Clinical Impressions(s) / ED Diagnoses   Final diagnoses:  Sickle cell pain crisis [facility])    New Prescriptions New Prescriptions   No medications on file     Duffy Bruce, MD 04/18/17 2346

## 2017-04-18 NOTE — ED Notes (Signed)
Provided patient gran-grape juice.

## 2017-04-18 NOTE — ED Notes (Signed)
Bed: WA07 Expected date: 04/18/17 Expected time: 3:51 PM Means of arrival: Ambulance Comments: Sickle Cell

## 2017-04-18 NOTE — ED Triage Notes (Signed)
Per EMS: Pt is coming from home. Pt states she is having pain in her arm and in her head because of sickle cell crisis.  AXO x 4 Spo2 93 on RA 118/48 BP 87 Pulse 18 RR  Call Grandfather for ride home:  [phone]

## 2017-04-19 DIAGNOSIS — D638 Anemia in other chronic diseases classified elsewhere: Secondary | ICD-10-CM

## 2017-04-19 DIAGNOSIS — F1410 Cocaine abuse, uncomplicated: Secondary | ICD-10-CM

## 2017-04-19 DIAGNOSIS — D5700 Hb-SS disease with crisis, unspecified: Principal | ICD-10-CM

## 2017-04-19 LAB — URINALYSIS, ROUTINE W REFLEX MICROSCOPIC
BILIRUBIN URINE: NEGATIVE
GLUCOSE, UA: NEGATIVE mg/dL
HGB URINE DIPSTICK: NEGATIVE
KETONES UR: NEGATIVE mg/dL
Leukocytes, UA: NEGATIVE
Nitrite: NEGATIVE
PROTEIN: NEGATIVE mg/dL
Specific Gravity, Urine: 1.013 (ref 1.005–1.030)
pH: 6 (ref 5.0–8.0)

## 2017-04-19 LAB — PHOSPHORUS: PHOSPHORUS: 3.1 mg/dL (ref 2.5–4.6)

## 2017-04-19 LAB — RAPID URINE DRUG SCREEN, HOSP PERFORMED
Amphetamines: NOT DETECTED
Barbiturates: NOT DETECTED
Benzodiazepines: NOT DETECTED
Cocaine: POSITIVE — AB
OPIATES: NOT DETECTED
TETRAHYDROCANNABINOL: NOT DETECTED

## 2017-04-19 LAB — MAGNESIUM: MAGNESIUM: 1.7 mg/dL (ref 1.7–2.4)

## 2017-04-19 MED ORDER — SENNOSIDES-DOCUSATE SODIUM 8.6-50 MG PO TABS
1.0000 | ORAL_TABLET | Freq: Two times a day (BID) | ORAL | Status: DC
  Administered 2017-04-19 – 2017-04-20 (×3): 1 via ORAL
  Filled 2017-04-19 (×5): qty 1

## 2017-04-19 MED ORDER — ONDANSETRON HCL 4 MG PO TABS: 4.0000 mg | ORAL_TABLET | ORAL | Status: DC | PRN

## 2017-04-19 MED ORDER — KETOROLAC TROMETHAMINE 15 MG/ML IJ SOLN
15.0000 mg | Freq: Four times a day (QID) | INTRAMUSCULAR | Status: DC
  Administered 2017-04-19 – 2017-04-20 (×4): 15 mg via INTRAVENOUS
  Filled 2017-04-19 (×4): qty 1

## 2017-04-19 MED ORDER — ONDANSETRON HCL 4 MG/2ML IJ SOLN: 4.0000 mg | INTRAMUSCULAR | Status: DC | PRN

## 2017-04-19 MED ORDER — POLYETHYLENE GLYCOL 3350 17 G PO PACK: 17.0000 g | PACK | Freq: Every day | ORAL | Status: DC | PRN

## 2017-04-19 MED ORDER — HYDROXYZINE HCL 25 MG PO TABS
25.0000 mg | ORAL_TABLET | ORAL | Status: DC | PRN
  Administered 2017-04-19: 50 mg via ORAL
  Filled 2017-04-19: qty 2

## 2017-04-19 MED ORDER — NICOTINE 21 MG/24HR TD PT24
21.0000 mg | MEDICATED_PATCH | Freq: Every day | TRANSDERMAL | Status: DC
  Filled 2017-04-19: qty 1

## 2017-04-19 MED ORDER — MAGNESIUM CITRATE PO SOLN: 1.0000 | Freq: Once | ORAL | Status: DC | PRN

## 2017-04-19 MED ORDER — ENOXAPARIN SODIUM 40 MG/0.4ML ~~LOC~~ SOLN
40.0000 mg | SUBCUTANEOUS | Status: DC
  Filled 2017-04-19: qty 0.4

## 2017-04-19 MED ORDER — ENOXAPARIN SODIUM 40 MG/0.4ML ~~LOC~~ SOLN: 40.0000 mg | SUBCUTANEOUS | Status: DC

## 2017-04-19 MED ORDER — OXYCODONE HCL 5 MG PO TABS
5.0000 mg | ORAL_TABLET | ORAL | Status: DC
  Administered 2017-04-19 – 2017-04-20 (×5): 5 mg via ORAL
  Filled 2017-04-19 (×5): qty 1

## 2017-04-19 MED ORDER — BISACODYL 5 MG PO TBEC: 5.0000 mg | DELAYED_RELEASE_TABLET | Freq: Every day | ORAL | Status: DC | PRN

## 2017-04-19 MED ORDER — FOLIC ACID 1 MG PO TABS
1.0000 mg | ORAL_TABLET | Freq: Every day | ORAL | Status: DC
  Administered 2017-04-19 – 2017-04-20 (×2): 1 mg via ORAL
  Filled 2017-04-19 (×2): qty 1

## 2017-04-19 MED ORDER — DEXTROSE-NACL 5-0.45 % IV SOLN
INTRAVENOUS | Status: AC
  Administered 2017-04-19 (×2): via INTRAVENOUS

## 2017-04-19 NOTE — Progress Notes (Signed)
Patient removed IV again. When going to start another IV site patient states "just let me sleep" patient resting in bed. NAD noted at this time. Will continue to monitor

## 2017-04-19 NOTE — Progress Notes (Signed)
After speaking with patient again about continuing care and listening to patient and helping patient calm down. Patient decided to stay admitted and continue with care plan. Patient agrees for personal belongings to be inventoried with security.

## 2017-04-19 NOTE — Progress Notes (Signed)
SICKLE CELL SERVICE PROGRESS NOTE  [name] [facility] NAT:[id number] DOB: 07/09/1996 DOA: 04/18/2017 PCP: Patient, No Pcp Per  Assessment/Plan: Active Problems:   Tobacco abuse   Leukocytosis   Sickle cell crisis ([place])   Headache  1. Hb SS with Crisis: Pt was treated only with Toradol since admission and is now in pain. Will schedule Oxycodone 5 mg every 4 hours and re-assess. Continue Toradol and IVF.  2. Anemia of Chronic Disease: Hb at baseline with appropriate reticulocytosis.  3. Cocaine Use: Pt has no obvious sequela from use.    Code Status: Full Code Family Communication: N/A Disposition Plan: Not yet ready for discharge  [place].  Pager [phone]. If 7PM-7AM, please contact night-coverage.  04/19/2017, 1:06 PM  LOS: 1 day   Interim History: Pt reports pain 7/10 in BUE's as compared to 0/10 pain when not in crisis. She offers that she used cocaine about 2 days ago. However she refuses to disclose the frequency with which she uses cocaine thus I'm unable to assess her tolerance or naivete with regard to opiates.  Consultants:  None  Procedures:  None  Antibiotics:  none   Objective: Vitals:   04/18/17 2252 04/18/17 2300 04/19/17 0033 04/19/17 0517  BP: (!) 93/53 (!) 94/51 113/72 108/61  Pulse: 73 80 68 77  Resp: (!) 28 (!) 22 (!) 22 (!) 22  Temp: 99.5 F (37.5 C)  99.7 F (37.6 C) 98.6 F (37 C)  TempSrc: Oral  Oral Oral  SpO2: 96% 95% 100% 97%   Weight change:   Intake/Output Summary (Last 24 hours) at 04/19/17 1306 Last data filed at 04/19/17 1041  Gross per 24 hour  Intake                0 ml  Output              750 ml  Net             -750 ml     Physical Exam General: Alert, awake, oriented x3, in moderate distress due to pain.  HEENT: [place]/AT PEERL, EOMI, mild icterus Neck: Trachea midline,  no masses, no thyromegal,y no JVD, no carotid bruit OROPHARYNX:  Moist, No exudate/ erythema/lesions.  Heart: Regular rate and rhythm,  without murmurs, rubs, gallops, PMI non-displaced, no heaves or thrills on palpation.  Lungs: Clear to auscultation, no wheezing or rhonchi noted. No increased vocal fremitus resonant to percussion  Abdomen: Soft, nontender, nondistended, positive bowel sounds, no masses no hepatosplenomegaly noted..  Neuro: No focal neurological deficits noted cranial nerves II through XII grossly intact.  Strength functional  in bilateral upper and lower extremities. Musculoskeletal: No warmth swelling or erythema around joints, no spinal tenderness noted. Psychiatric: Patient alert and oriented x3, questionable insight.     Data Reviewed: Basic Metabolic Panel:  Recent Labs Lab 04/18/17 1640 04/19/17 0801  NA 140  --   K 4.4  --   CL 109  --   CO2 26  --   GLUCOSE 97  --   BUN 10  --   CREATININE 0.74  --   CALCIUM 9.0  --   MG  --  1.7  PHOS  --  3.1   Liver Function Tests:  Recent Labs Lab 04/18/17 1640  AST 44*  ALT 29  ALKPHOS 67  BILITOT 4.6*  PROT 7.0  ALBUMIN 3.8   No results for input(s): LIPASE, AMYLASE in the last 168 hours. No results for input(s): AMMONIA in  the last 168 hours. CBC:  Recent Labs Lab 04/18/17 1640  WBC 11.8*  NEUTROABS 6.7  HGB 7.4*  HCT 20.2*  MCV 94.0  PLT 627*   Cardiac Enzymes: No results for input(s): CKTOTAL, CKMB, CKMBINDEX, TROPONINI in the last 168 hours. BNP (last 3 results) No results for input(s): BNP in the last 8760 hours.  ProBNP (last 3 results) No results for input(s): PROBNP in the last 8760 hours.  CBG: No results for input(s): GLUCAP in the last 168 hours.  No results found for this or any previous visit (from the past 240 hour(s)).   Studies: Ct Angio Head W Or Wo Contrast  Result Date: 04/18/2017 CLINICAL DATA:  Arm and head pain, attributed to sickle cell crisis. Unresponsive. EXAM: CT ANGIOGRAPHY HEAD TECHNIQUE: Multidetector CT imaging of the head was performed using the standard protocol during bolus  administration of intravenous contrast. Multiplanar CT image reconstructions and MIPs were obtained to evaluate the vascular anatomy. CONTRAST:  100 cc Isovue 370 COMPARISON:  CT HEAD October 19, 2016. FINDINGS: CT HEAD BRAIN: No intraparenchymal hemorrhage, mass effect nor midline shift. The ventricles and sulci are normal. No acute large vascular territory infarcts. No abnormal extra-axial fluid collections. Basal cisterns are patent. VASCULAR: Unremarkable. SKULL/SOFT TISSUES: No skull fracture. No significant soft tissue swelling. ORBITS/SINUSES: The included ocular globes and orbital contents are normal.The mastoid aircells and included paranasal sinuses are well-aerated. OTHER: None. CTA HEAD ANTERIOR CIRCULATION: Patent included cervical, petrous, cavernous and supra clinoid internal carotid arteries. Widely patent anterior communicating artery. 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 Patent anterior and middle cerebral arteries. No large vessel occlusion, hemodynamically significant stenosis, contrast extravasation. POSTERIOR CIRCULATION: LEFT vertebral artery is dominant with patent vertebral arteries, vertebrobasilar junction and basilar artery, as well as main branch vessels. Patent posterior cerebral arteries. Robust bilateral posterior communicating arteries present. No large vessel occlusion, hemodynamically significant stenosis, contrast extravasation or aneurysm. VENOUS SINUSES: Major dural venous sinuses are patent though not tailored for evaluation on this angiographic examination. ANATOMIC VARIANTS: None. DELAYED PHASE: No abnormal intracranial enhancement. IMPRESSION: CT HEAD: Normal. CTA HEAD: No emergent large vessel or significant stenosis. 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 Electronically Signed   By: Elon Alas M.D.   On: 04/18/2017 19:34    Scheduled Meds: . enoxaparin (LOVENOX) injection  40 mg Subcutaneous Q24H  .  folic acid  1 mg Oral Daily  . ketorolac  15 mg Intravenous Q6H  . nicotine  21 mg Transdermal Daily  . oxyCODONE  5 mg Oral Q4H  . senna-docusate  1 tablet Oral BID   Continuous Infusions: . dextrose 5 % and 0.45% NaCl 100 mL/hr at 04/19/17 1018    Active Problems:   Tobacco abuse   Leukocytosis   Sickle cell crisis (H[facility])   Headache     In excess of 25 minutes spent during this visit. Greater than 50% involved face to face contact with the patient for assessment, counseling and coordination of care.

## 2017-04-19 NOTE — Progress Notes (Signed)
Attempted to redirect patient by therapeutic discussion of feeling and care plan. Patient continues to request to leave AMA. AMA paper signed, IV was already removed by patient. Patient provided with blue paper scrubs for discharge.

## 2017-04-19 NOTE — Progress Notes (Deleted)
This RN opened black wallet to take inventory and take to security for safe storage. Glass pipe, knife, and brown substance were found in wallet. Will take to security and alert them.

## 2017-04-19 NOTE — Progress Notes (Signed)
Pt arrived to unit room 1513 via stretcher. Pt lethargic,unkempt, soiled and  alert to self only, after several attempts at asking.. VS taken. Informed by reporting RN in the ED that pt has been lethargic since medications were given there; Zofran, Compazine, benadryl and dilaudid. Continues to be hard to arouse. Pt bathed and new gown placed on. Pts belongings which included pocketknife at nurses station. Will continue to monitor.

## 2017-04-19 NOTE — Progress Notes (Deleted)
Approached patient to get signature for taking belongings to security. Patient became angry, yelling, and is requesting to leave AMA. Administrative coordinator contacted about situation. Patient can leave AMA and will receive her belongings as she is leaving.

## 2017-04-19 NOTE — Care Management Note (Signed)
Case Management Note  Patient Details  Name: [name] MRN: [id number] Date of Birth: Jul 04, 1996  Subjective/Objective:                   home Lives   Family( grandma)     Chief Complaint: Pain in arm and head because of sickle cell crisis   Action/Plan: Date:  April 19, 2017 Chart reviewed for concurrent status and case management needs. Will continue to follow patient progress. Discharge Planning: following for needs Expected discharge date: [id number] Velva Harman, BSN, [place], [place]  Expected Discharge Date:   (unknown)               Expected Discharge Plan:  Home/Self Care  In-House Referral:     Discharge planning Services  CM Consult  Post Acute Care Choice:    Choice offered to:     DME Arranged:    DME Agency:     HH Arranged:    [place] Agency:     Status of Service:  In process, will continue to follow  If discussed at Long Length of Stay Meetings, dates discussed:    Additional Comments:  Leeroy Cha, RN 04/19/2017, 10:03 AM

## 2017-04-19 NOTE — Progress Notes (Signed)
This shift pt continued to be lethargic. When awakened briefly  alert to self only and then fast asleep, vitals stable will continue to monitor

## 2017-04-20 LAB — TYPE AND SCREEN
ABO/RH(D): B POS
Antibody Screen: NEGATIVE

## 2017-04-20 LAB — ABO/RH: ABO/RH(D): B POS

## 2017-04-20 MED ORDER — ACETAMINOPHEN 325 MG PO TABS
650.0000 mg | ORAL_TABLET | Freq: Four times a day (QID) | ORAL | Status: DC | PRN
  Administered 2017-04-20: 650 mg via ORAL
  Filled 2017-04-20: qty 2

## 2017-04-20 MED ORDER — OXYCODONE HCL 5 MG PO TABS: 5.0000 mg | ORAL_TABLET | Freq: Four times a day (QID) | ORAL | 0 refills | Status: DC | PRN

## 2017-04-20 NOTE — Progress Notes (Signed)
Patient agreed to let this nurse start a new IV. Patient calm and cooperative this morning. No needs voiced

## 2017-04-20 NOTE — Progress Notes (Signed)
DC instructions reviewed with patient. She expressed understanding. Packed up belongings for patient. Escorted patient to security where we picked up items in safe. Patient stated her ride was here but requested to be left at main entrance stating that ride will be here soon.

## 2017-04-20 NOTE — Progress Notes (Signed)
Patient refuses to ambulate for this RN to attain pulse oxygen saturation during ambulation.

## 2017-04-20 NOTE — Discharge Summary (Signed)
[name] MRN: [id number] DOB/AGE: [birth year] 21 y.o.  Admit date: 04/18/2017 Discharge date: 04/20/2017  Primary Care Physician:  Patient, No Pcp Per   Discharge Diagnoses:   Patient Active Problem List   Diagnosis Date Noted  . Homelessness 02/12/2017  . Protein-calorie malnutrition, severe 10/21/2016  . Anemia due to other cause   . Hb-SS disease without crisis ([place])   . 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 Tobacco abuse   . Asthma, mild intermittent 12/14/2010    DISCHARGE MEDICATION: Allergies as of 04/20/2017   No Known Allergies     Medication List    TAKE these medications   folic acid 1 MG tablet Commonly known as:  FOLVITE Take 1 tablet (1 mg total) by mouth daily.   naproxen 375 MG tablet Commonly known as:  NAPROSYN Take 1 tablet (375 mg total) by mouth 2 (two) times daily.   oxyCODONE 5 MG immediate release tablet Commonly known as:  Oxy IR/ROXICODONE Take 1 tablet (5 mg total) by mouth every 6 (six) hours as needed for severe pain.         Consults:    SIGNIFICANT DIAGNOSTIC STUDIES:  Ct Angio Head W Or Wo Contrast  Result Date: 04/18/2017 CLINICAL DATA:  Arm and head pain, attributed to sickle cell crisis. Unresponsive. EXAM: CT ANGIOGRAPHY HEAD TECHNIQUE: Multidetector CT imaging of the head was performed using the standard protocol during bolus administration of intravenous contrast. Multiplanar CT image reconstructions and MIPs were obtained to evaluate the vascular anatomy. CONTRAST:  100 cc Isovue 370 COMPARISON:  CT HEAD October 19, 2016. FINDINGS: CT HEAD BRAIN: No intraparenchymal hemorrhage, mass effect nor midline shift. The ventricles and sulci are normal. No acute large vascular territory infarcts. No abnormal extra-axial fluid collections. Basal cisterns are patent. VASCULAR: Unremarkable. SKULL/SOFT TISSUES: No skull fracture. No significant soft tissue swelling.  ORBITS/SINUSES: The included ocular globes and orbital contents are normal.The mastoid aircells and included paranasal sinuses are well-aerated. OTHER: None. CTA HEAD ANTERIOR CIRCULATION: Patent included cervical, petrous, cavernous and supra clinoid internal carotid arteries. Widely patent anterior communicating artery. 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 Patent anterior and middle cerebral arteries. No large vessel occlusion, hemodynamically significant stenosis, contrast extravasation. POSTERIOR CIRCULATION: LEFT vertebral artery is dominant with patent vertebral arteries, vertebrobasilar junction and basilar artery, as well as main branch vessels. Patent posterior cerebral arteries. Robust bilateral posterior communicating arteries present. No large vessel occlusion, hemodynamically significant stenosis, contrast extravasation or aneurysm. VENOUS SINUSES: Major dural venous sinuses are patent though not tailored for evaluation on this angiographic examination. ANATOMIC VARIANTS: None. DELAYED PHASE: No abnormal intracranial enhancement. IMPRESSION: CT HEAD: Normal. CTA HEAD: No emergent large vessel or significant stenosis. 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 Electronically Signed   By: Elon Alas M.D.   On: 04/18/2017 19:34       No results found for this or any previous visit (from the past 240 hour(s)).  BRIEF ADMITTING H & P: [name] is a 21 y.o. female with medical history significant of sickle cell, tobacco abuse and homelessness with failure to follow-up    Presented with current headaches. Describes headache as moderate and throbbing similar to usual headaches that she's been getting. But she also have had some aching and throbbing right side arm pain with similar to pain crisis is not associated chest pain shortness of breath no cough no  fevers or chills no neck stiffness she had had prior admission with  headache associated sickle cell crisis in May at that time MRI of the head was attempted but patient left AMA. Right to being fully evaluated. Patient have had recurrent episodes of admissions and leaving AMA Patient is not on any pain medications at home.  Patient is not answering questions and turned away from the examiner. She states her shoulders hurt.  Regarding pertinent Chronic problems: Sickle cell disease complicated by homelessness inability to follow-up   Hospital Course:  Present on Admission: . Tobacco abuse . (Resolved) Sickle cell crisis ([place]) . (Resolved) Leukocytosis  Pt is an Opiate naive patient with Hb SS, chronic homelessness and cocaine use. Pt was admitted with Sickle Cell Crisis and initially managed only with Toradol and IVF. However this was insufficient to manage her pain and several hours later her pain was at an intensity of 10/10. Oxycodone 5 mg was added to her regimen and this afforded very good control pf her pain. At the time of discharge her pain level was 3/10. She refused ambulation to assess her condition with ambulation. She was issued a prescription for Oxycodone 5 mg # 12 tabs to be used every 6 hours as needed as she has no provider.   Pt has severe protein-calorie malnutrition and refused dietary consult and protein supplements. She stated that she does not take supplements.  Pt has a h/o asthma but this was quiescent during this hospitalization. Saturations were 100% on RA at rest. However patient refused ambulation to check saturations with ambulation.   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  She does not have a provider but has in the past refused all assistance offered to find a provider. On the occasion that she has remained in the hospital until discharge and an  appointment was made for her in the past, she has not followed up. I consulted case manager to see patient and assist with arranging an appointment with Primary provider and assisting with barriers to keeping the appointment.      Disposition and Follow-up:  Pt is discharged home in good condition. She does not have a Primary Care Provider and a Case Management Consult has been placed.  Discharge Instructions    Activity as tolerated - No restrictions    Complete by:  As directed    Diet general    Complete by:  As directed       DISCHARGE EXAM:  General: Alert, awake, oriented x3, in no distress.  Pt is very unkempt appearing with matted hair.  HEENT: [place]/AT PEERL, EOMI. Mild icterus present.  Neck: Trachea midline, no masses, no thyromegal,y no JVD, no carotid bruit OROPHARYNX:Refused examination.  Heart: Regular rate and rhythm, without murmurs, rubs, gallops or S3. PMI non-displaced. Exam reveals no decreased pulses. Pulmonary/Chest: Normal effort. Breath sounds normal. No. Apnea. Clear to auscultation,no stridor,  no wheezing and no rhonchi noted. No respiratory distress and no tenderness noted. Abdomen: Soft, nontender, nondistended, normal bowel sounds, no masses no hepatosplenomegaly noted. No fluid wave and no ascites. There is no guarding or rebound. Neuro: Alert and oriented to person, place and time. Normal motor skills, Displays no atrophy or tremors and exhibits normal muscle tone.  No focal neurological deficits noted cranial nerves II through XII  grossly intact. No sensory deficit noted. Strength at baseline in bilateral upper and lower extremities.Unable to assess gait as patient refuses to ambulate. Musculoskeletal: No warmth swelling or erythema around joints, no spinal tenderness noted. Psychiatric: Patient alert and oriented x3, poor insight. Pt displays a very irritated mood and disgusted affect as if speaking with staff is an inconvenience. She is very distrustful and  does nto want to be bothered. However she appears from her speech to be very worldly but appears to have  priorities that are out of line with cultural norms.    Blood pressure (!) 100/49, pulse 64, temperature 98.4 F (36.9 C), temperature source Oral, resp. rate (!) 22, SpO2 100 %.   Recent Labs  04/18/17 1640 04/19/17 0801  NA 140  --   K 4.4  --   CL 109  --   CO2 26  --   GLUCOSE 97  --   BUN 10  --   CREATININE 0.74  --   CALCIUM 9.0  --   MG  --  1.7  PHOS  --  3.1    Recent Labs  04/18/17 1640  AST 44*  ALT 29  ALKPHOS 67  BILITOT 4.6*  PROT 7.0  ALBUMIN 3.8   No results for input(s): LIPASE, AMYLASE in the last 72 hours.  Recent Labs  04/18/17 1640  WBC 11.8*  NEUTROABS 6.7  HGB 7.4*  HCT 20.2*  MCV 94.0  PLT 627*     Total time spent including face to face and decision making was greater than 30 minutes  Signed: MATTHEWS,MICHELLE A. 04/20/2017, 12:59 PM

## 2017-04-20 NOTE — Progress Notes (Signed)
Date: April 20, 2017 Chart reviewed for discharge orders: Patient given informtion on how to call Medicare for a provider in her area and for the [facility] health [facility] wellness center unable to make appt today due to scheduling. Vernia Buff, [phone]

## 2017-04-20 NOTE — Progress Notes (Signed)
Per night shift RN patient inquired about reducing her pain medication dosing as it makes her drowsy.

## 2017-04-20 NOTE — Progress Notes (Signed)
RN discussed pain level before adminitering 1000 meds. Patient rates pain in her head at 4/10 and states she would like to take the scheduled dose of oxycodone.

## 2017-05-14 ENCOUNTER — Emergency Department (HOSPITAL_COMMUNITY)
Admission: EM | Admit: 2017-05-14 | Discharge: 2017-05-14 | Disposition: A | Discharge status: Home / Self Care | Payer: Medicare Other | Attending: Emergency Medicine | Admitting: Emergency Medicine

## 2017-05-14 ENCOUNTER — Encounter (HOSPITAL_COMMUNITY): Payer: Self-pay | Admitting: Emergency Medicine

## 2017-05-14 ENCOUNTER — Emergency Department (HOSPITAL_COMMUNITY): Payer: Medicare Other

## 2017-05-14 DIAGNOSIS — D5700 Hb-SS disease with crisis, unspecified: Secondary | ICD-10-CM | POA: Insufficient documentation

## 2017-05-14 DIAGNOSIS — J4520 Mild intermittent asthma, uncomplicated: Secondary | ICD-10-CM | POA: Insufficient documentation

## 2017-05-14 DIAGNOSIS — Y999 Unspecified external cause status: Secondary | ICD-10-CM | POA: Insufficient documentation

## 2017-05-14 DIAGNOSIS — Y939 Activity, unspecified: Secondary | ICD-10-CM | POA: Diagnosis not present

## 2017-05-14 DIAGNOSIS — S3992XA Unspecified injury of lower back, initial encounter: Secondary | ICD-10-CM | POA: Diagnosis not present

## 2017-05-14 DIAGNOSIS — Y92009 Unspecified place in unspecified non-institutional (private) residence as the place of occurrence of the external cause: Secondary | ICD-10-CM | POA: Insufficient documentation

## 2017-05-14 DIAGNOSIS — F17210 Nicotine dependence, cigarettes, uncomplicated: Secondary | ICD-10-CM | POA: Diagnosis not present

## 2017-05-14 DIAGNOSIS — R52 Pain, unspecified: Secondary | ICD-10-CM

## 2017-05-14 LAB — CBC WITH DIFFERENTIAL/PLATELET
BASOS ABS: 0 10*3/uL (ref 0.0–0.1)
BASOS PCT: 0 %
EOS ABS: 0 10*3/uL (ref 0.0–0.7)
Eosinophils Relative: 0 %
HCT: 20.8 % — ABNORMAL LOW (ref 36.0–46.0)
Hemoglobin: 7.6 g/dL — ABNORMAL LOW (ref 12.0–15.0)
LYMPHS PCT: 42 %
Lymphs Abs: 5.8 10*3/uL — ABNORMAL HIGH (ref 0.7–4.0)
MCH: 31.7 pg (ref 26.0–34.0)
MCHC: 36.5 g/dL — ABNORMAL HIGH (ref 30.0–36.0)
MCV: 86.7 fL (ref 78.0–100.0)
MONO ABS: 1 10*3/uL (ref 0.1–1.0)
Monocytes Relative: 7 %
NEUTROS PCT: 51 %
Neutro Abs: 6.9 10*3/uL (ref 1.7–7.7)
PLATELETS: 625 10*3/uL — AB (ref 150–400)
RBC: 2.4 MIL/uL — ABNORMAL LOW (ref 3.87–5.11)
RDW: 21.3 % — ABNORMAL HIGH (ref 11.5–15.5)
WBC: 13.7 10*3/uL — AB (ref 4.0–10.5)

## 2017-05-14 LAB — I-STAT BETA HCG BLOOD, ED (MC, WL, AP ONLY): I-stat hCG, quantitative: <5 m[IU]/mL (ref <5)

## 2017-05-14 LAB — BASIC METABOLIC PANEL
Anion gap: 7 (ref 5–15)
BUN: 11 mg/dL (ref 6–20)
CALCIUM: 9 mg/dL (ref 8.9–10.3)
CO2: 25 mmol/L (ref 22–32)
CREATININE: 0.76 mg/dL (ref 0.44–1.00)
Chloride: 106 mmol/L (ref 101–111)
GFR calc Af Amer: >60 mL/min (ref >60)
GFR calc non Af Amer: >60 mL/min (ref >60)
Glucose, Bld: 132 mg/dL — ABNORMAL HIGH (ref 65–99)
Potassium: 3.7 mmol/L (ref 3.5–5.1)
SODIUM: 138 mmol/L (ref 135–145)

## 2017-05-14 LAB — RETICULOCYTES
RBC.: 2.4 MIL/uL — AB (ref 3.87–5.11)
RETIC CT PCT: 12.2 % — AB (ref 0.4–3.1)
Retic Count, Absolute: 292.8 10*3/uL — ABNORMAL HIGH (ref 19.0–186.0)

## 2017-05-14 MED ORDER — DIPHENHYDRAMINE HCL 50 MG/ML IJ SOLN
25.0000 mg | Freq: Once | INTRAMUSCULAR | Status: AC
  Administered 2017-05-14: 25 mg via INTRAVENOUS
  Filled 2017-05-14: qty 1

## 2017-05-14 MED ORDER — SODIUM CHLORIDE 0.45 % IV SOLN
INTRAVENOUS | Status: DC
  Administered 2017-05-14: 18:00:00 via INTRAVENOUS

## 2017-05-14 MED ORDER — IBUPROFEN 600 MG PO TABS: 600.0000 mg | ORAL_TABLET | Freq: Four times a day (QID) | ORAL | 0 refills | Status: DC | PRN

## 2017-05-14 MED ORDER — CYCLOBENZAPRINE HCL 5 MG PO TABS: 5.0000 mg | ORAL_TABLET | Freq: Two times a day (BID) | ORAL | 0 refills | Status: DC | PRN

## 2017-05-14 MED ORDER — FENTANYL CITRATE (PF) 100 MCG/2ML IJ SOLN
50.0000 ug | Freq: Once | INTRAMUSCULAR | Status: AC
  Administered 2017-05-14: 50 ug via INTRAVENOUS
  Filled 2017-05-14: qty 2

## 2017-05-14 MED ORDER — HYDROMORPHONE HCL 1 MG/ML IJ SOLN
1.0000 mg | Freq: Once | INTRAMUSCULAR | Status: AC
  Administered 2017-05-14: 1 mg via INTRAVENOUS
  Filled 2017-05-14: qty 1

## 2017-05-14 NOTE — Discharge Instructions (Signed)
Take motrin for pain.   Take flexeril for muscle strain.   You need to follow up with your sickle cell doctor regarding your prescription for pain meds.   See your sickle cell doctor.   Go to a shelter  Return to ER if you have worse back pain, chest pain, fever, vomiting.   Substance Abuse Treatment Programs  Intensive Outpatient Programs [facility]     [address], [place]       [facility] [place] [address] [place], [place]  [place] [facility]     (Inpatient and outpatient)     [address]           [place] [phone] (Suboxone and Methadone)  [place], [place] [address]      [place] [address] [place], [place]  [facility] (Outpatient/Inpatient, [place])    (insurance only) [phone]             [facility] ([place]) [place], [place]     [facility]     [address]     [place], [place]       [facility] (for caregivers and family) [phone] [address] Kristeen Mans. [place], [place]      Residential Treatment Programs [facility]      [address] [place], [place] [address]  [phone]       [facility] [address], [place], [place] [address] [phone]  [facility] [place]        [phone]       [facility] [place] [phone]  [facility] ([place]             [place], [place] or [place] [address] [place], [address] [phone]  [facility]    [address]      [place], [place]       [facility] [address] [place], [place]  [place]   [address] [place], [place] [address]     [phone]      Admissions: 8am-3pm M-F  [facility] (RTS) [address] [place], [place]  [facility] Program: Residential Program [phone] Days)   [place], [place] or [phone]     [facility]: [place]  [place], [place] (Walk in Hours over the weekend or by referral)  [facility] [place], [place], [place] [address] [phone]  Crisis Mobile: [facility]:  [phone] (for crisis response 24 hours a day) [facility] Hotline:      [phone] Outpatient Psychiatry and Counseling  [facility]: Mobile Crisis Management 24 hours:  [phone]  [facility] of the [employer] sliding scale fee and walk in schedule: M-F 8am-12pm/1pm-3pm [place], [place] [address] [address], [place] [address] [phone]  [facility] (Formerly known as [facility] [employer])- new patient walk-in appointments available Monday - Friday 8am -3pm.          [address] [place], [place] [address] [phone] or crisis line- [facility]/ Intensive Outpatient Therapy Program [place], [place] [address] [place]      [phone] [address] [place], [place] [address]                 [place]   [facility] [phone]. [address] [place], [place] [address]   [facility]          [address] Johnette Abraham  [place], [place] [address]       [phone]  [facility] [address], [place], [place]  [address] [phone]  [facility]    [address] [place], [place] [address]     [place], [place] Joycelyn Man     [place] [address]     [phone]       [facility] [place] [address]  [facility]     [address], [place], MD [place], [place] [address] [place]     [address]     [place], [place] [address]     [phone]       [facility] [address] [place], [place] [address] [phone] Resources for Temporary Residential Assistance/Crisis [place] [facility]) M-F 8am-3pm   [address], [place] [address]   [phone] Services include: laundry, barbering, support groups, case management, phone  & computer access, showers, AA/NA mtgs, mental health/substance abuse nurse, job skills class, disability information, VA assistance, spiritual classes, etc.   [facility]   [address], [place]     [place]              [employer] (women and children)       [place]. [place], [place] [address] [phone] [email or web address] for application and process Application Required  [facility]   [address]    [place] [address]     [phone]  [place], [place] [address] [phone](DUKGURKY application appt.) Application Required  [facility] (women only)    [address]     [place], [place] [address]     [phone]      Intake starts 6pm daily Need valid ID, SSC, & Police report [employer] [address] [place], [place] [phone] Application Required  [employer] (men only)     [place].      [place], [place]       [place] (Pregnant women only) [address] [place], [place]  [facility]      [address]      [place], [place] [address]     [phone]             [facility] [address] [place], [place] 90 day commitment/SA/Application process  [facility](men only)     [address]     [place], [place]       Check-in at [facility] [address] [place], [place] [address] [phone] Men/Women/Women and Children must be there by 7 pm  [place], [place]

## 2017-05-14 NOTE — ED Notes (Signed)
Pt asked writer to leave room and states that she doesn't want to speak to this RN bc I "dont understand English." Pt wishes honored and another Therapist, sports will continue d/c.

## 2017-05-14 NOTE — Progress Notes (Signed)
CSW updated by EDP.  Pt reported to the EDP she "wanted to go home".    CSW met with pt and CSW met with pt to offer and pt accepted meeting schedules for area Alcoholics Anonymous and Narcotics Anonymous 12-Step meetings as well as inpatient/outpatient SA Tx  And Homelessness resources.  CSW provided education to the pt as to the efficacy of 12-step programs for community support for those needing support in addition to or other than inpatient/outpatient treatment.    CSW also offered pt specific instructions on how to arrive to the [place] [facility] between the hours of 7:30am-9 am on 05/15/17 (also open Mon-Friday) to be assessed and possibly admitted for 30 days of inpatient SA TX.  Pt asked CSW to leave stating, "I don't want to talk about that, I want to see my nurse".    CSW informed RN.  Alphonse Guild. Dakiya Puopolo, Latanya Presser, LCAS Clinical Social Worker Ph: [phone]

## 2017-05-14 NOTE — ED Notes (Signed)
Pt is tearful and stating she "has no one" and she "is all alone".
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
## 2017-05-14 NOTE — ED Notes (Signed)
Went to d/c pt. She states "you are racist fucker, I'm not leaving." Pt proceeded to state that she wanted to speak to the MD again. MD made aware and stated that she has been placed up for discharge. She refuses to leave. Primary RN made aware.

## 2017-06-10 ENCOUNTER — Emergency Department (HOSPITAL_COMMUNITY)
Admission: EM | Admit: 2017-06-10 | Discharge: 2017-06-10 | Disposition: A | Discharge status: Home / Self Care | Payer: Medicare Other | Attending: Physician Assistant | Admitting: Physician Assistant

## 2017-06-10 ENCOUNTER — Emergency Department (HOSPITAL_COMMUNITY): Payer: Medicare Other

## 2017-06-10 ENCOUNTER — Encounter (HOSPITAL_COMMUNITY): Payer: Self-pay

## 2017-06-10 DIAGNOSIS — S0083XA Contusion of other part of head, initial encounter: Secondary | ICD-10-CM | POA: Insufficient documentation

## 2017-06-10 DIAGNOSIS — Y929 Unspecified place or not applicable: Secondary | ICD-10-CM | POA: Insufficient documentation

## 2017-06-10 DIAGNOSIS — Y939 Activity, unspecified: Secondary | ICD-10-CM | POA: Diagnosis not present

## 2017-06-10 DIAGNOSIS — J45909 Unspecified asthma, uncomplicated: Secondary | ICD-10-CM | POA: Diagnosis not present

## 2017-06-10 DIAGNOSIS — F17210 Nicotine dependence, cigarettes, uncomplicated: Secondary | ICD-10-CM | POA: Diagnosis not present

## 2017-06-10 DIAGNOSIS — Y999 Unspecified external cause status: Secondary | ICD-10-CM | POA: Diagnosis not present

## 2017-06-10 DIAGNOSIS — R52 Pain, unspecified: Secondary | ICD-10-CM

## 2017-06-10 DIAGNOSIS — R51 Headache: Secondary | ICD-10-CM | POA: Diagnosis not present

## 2017-06-10 LAB — I-STAT BETA HCG BLOOD, ED (MC, WL, AP ONLY)

## 2017-06-10 MED ORDER — HYDROMORPHONE HCL 1 MG/ML IJ SOLN
1.0000 mg | Freq: Once | INTRAMUSCULAR | Status: AC
  Administered 2017-06-10: 1 mg via INTRAMUSCULAR
  Filled 2017-06-10: qty 1

## 2017-06-10 MED ORDER — METHOCARBAMOL 500 MG PO TABS: 1000.0000 mg | ORAL_TABLET | Freq: Four times a day (QID) | ORAL | 0 refills | Status: DC | PRN

## 2017-06-10 NOTE — ED Notes (Signed)
Pt at CT
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
## 2017-06-10 NOTE — ED Notes (Signed)
Ct to return pt to treatment room.
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
## 2017-06-10 NOTE — Discharge Instructions (Signed)
Rest, Ice intermittently (in the first 24-48 hours), Gentle compression with an Ace wrap, and elevate (Limb above the level of the heart)   Take up to 800mg  of ibuprofen (that is usually 4 over the counter pills)  3 times a day for 5 days. Take with food.   For breakthrough pain you may take Robaxin. Do not drink alcohol, drive or operate heavy machinery when taking Robaxin.

## 2017-07-08 ENCOUNTER — Emergency Department (HOSPITAL_COMMUNITY)
Admission: EM | Admit: 2017-07-08 | Discharge: 2017-07-09 | Disposition: A | Discharge status: Home / Self Care | Payer: Medicare Other | Attending: Emergency Medicine | Admitting: Emergency Medicine

## 2017-07-08 ENCOUNTER — Encounter (HOSPITAL_COMMUNITY): Payer: Self-pay | Admitting: Emergency Medicine

## 2017-07-08 DIAGNOSIS — D5700 Hb-SS disease with crisis, unspecified: Secondary | ICD-10-CM | POA: Insufficient documentation

## 2017-07-08 DIAGNOSIS — J4520 Mild intermittent asthma, uncomplicated: Secondary | ICD-10-CM | POA: Insufficient documentation

## 2017-07-08 DIAGNOSIS — F17210 Nicotine dependence, cigarettes, uncomplicated: Secondary | ICD-10-CM | POA: Diagnosis not present

## 2017-07-08 DIAGNOSIS — M79604 Pain in right leg: Secondary | ICD-10-CM | POA: Diagnosis present

## 2017-07-08 LAB — COMPREHENSIVE METABOLIC PANEL
ALBUMIN: 4.6 g/dL (ref 3.5–5.0)
ALK PHOS: 48 U/L (ref 38–126)
ALT: 12 U/L — ABNORMAL LOW (ref 14–54)
ANION GAP: 7 (ref 5–15)
AST: 23 U/L (ref 15–41)
BUN: 7 mg/dL (ref 6–20)
CALCIUM: 9.1 mg/dL (ref 8.9–10.3)
CO2: 24 mmol/L (ref 22–32)
Chloride: 108 mmol/L (ref 101–111)
Creatinine, Ser: 0.69 mg/dL (ref 0.44–1.00)
GFR calc non Af Amer: >60 mL/min (ref >60)
GLUCOSE: 118 mg/dL — AB (ref 65–99)
POTASSIUM: 3.3 mmol/L — AB (ref 3.5–5.1)
Sodium: 139 mmol/L (ref 135–145)
Total Bilirubin: 7.2 mg/dL — ABNORMAL HIGH (ref 0.3–1.2)
Total Protein: 7.7 g/dL (ref 6.5–8.1)

## 2017-07-08 LAB — RETICULOCYTES
RBC.: 2.51 MIL/uL — AB (ref 3.87–5.11)
RETIC CT PCT: 14.8 % — AB (ref 0.4–3.1)
Retic Count, Absolute: 371.5 10*3/uL — ABNORMAL HIGH (ref 19.0–186.0)

## 2017-07-08 LAB — CBC WITH DIFFERENTIAL/PLATELET
BASOS PCT: 0 %
Basophils Absolute: 0 10*3/uL (ref 0.0–0.1)
Eosinophils Absolute: 0.1 10*3/uL (ref 0.0–0.7)
Eosinophils Relative: 1 %
HCT: 23.5 % — ABNORMAL LOW (ref 36.0–46.0)
Hemoglobin: 8.7 g/dL — ABNORMAL LOW (ref 12.0–15.0)
LYMPHS ABS: 5.2 10*3/uL — AB (ref 0.7–4.0)
LYMPHS PCT: 43 %
MCH: 34.7 pg — ABNORMAL HIGH (ref 26.0–34.0)
MCHC: 37 g/dL — AB (ref 30.0–36.0)
MCV: 93.6 fL (ref 78.0–100.0)
MONO ABS: 1.1 10*3/uL — AB (ref 0.1–1.0)
MONOS PCT: 9 %
NEUTROS ABS: 5.6 10*3/uL (ref 1.7–7.7)
NEUTROS PCT: 46 %
Platelets: 530 10*3/uL — ABNORMAL HIGH (ref 150–400)
RBC: 2.51 MIL/uL — ABNORMAL LOW (ref 3.87–5.11)
RDW: 20.7 % — AB (ref 11.5–15.5)
WBC: 12.1 10*3/uL — ABNORMAL HIGH (ref 4.0–10.5)

## 2017-07-08 MED ORDER — DEXTROSE-NACL 5-0.45 % IV SOLN
INTRAVENOUS | Status: DC
  Administered 2017-07-08: 22:00:00 via INTRAVENOUS

## 2017-07-08 MED ORDER — HYDROMORPHONE HCL 1 MG/ML IJ SOLN: 1.0000 mg | INTRAMUSCULAR | Status: DC

## 2017-07-08 MED ORDER — KETOROLAC TROMETHAMINE 30 MG/ML IJ SOLN
30.0000 mg | INTRAMUSCULAR | Status: AC
  Administered 2017-07-08: 30 mg via INTRAVENOUS
  Filled 2017-07-08: qty 1

## 2017-07-08 MED ORDER — HYDROMORPHONE HCL 1 MG/ML IJ SOLN: 0.5000 mg | INTRAMUSCULAR | Status: AC

## 2017-07-08 MED ORDER — SODIUM CHLORIDE 0.9 % IV BOLUS (SEPSIS)
1000.0000 mL | Freq: Once | INTRAVENOUS | Status: AC
  Administered 2017-07-08: 1000 mL via INTRAVENOUS

## 2017-07-08 MED ORDER — HYDROMORPHONE HCL 1 MG/ML IJ SOLN
0.5000 mg | INTRAMUSCULAR | Status: AC
  Administered 2017-07-08: 0.5 mg via INTRAVENOUS
  Filled 2017-07-08: qty 1

## 2017-07-08 MED ORDER — ONDANSETRON HCL 4 MG/2ML IJ SOLN: 4.0000 mg | INTRAMUSCULAR | Status: DC | PRN

## 2017-07-08 MED ORDER — DIPHENHYDRAMINE HCL 50 MG/ML IJ SOLN
25.0000 mg | Freq: Once | INTRAMUSCULAR | Status: AC
  Administered 2017-07-08: 25 mg via INTRAVENOUS
  Filled 2017-07-08: qty 1

## 2017-07-08 NOTE — ED Provider Notes (Signed)
[facility] DEPT Provider Note   CSN: [id number] Arrival date & time: 07/08/17  Feb 22, 2128     History   Chief Complaint Chief Complaint  Patient presents with  . Sickle Cell Pain Crisis  . Bilateral leg pain    HPI [name] is a 21 y.o. female.  HPI  21 y.o. female with a hx of Sickle Cell Anemia, Homelessness presents to the Emergency Department today via EMS due to sickle cell crisis. Notes bilateral leg pain. States that this is typical for her sickle cell pain crisis. Pt is homeless and does not have a PCP. Does not take home pain medication for her sickle cell anemia. Notes pain occurred x 1 hour ago. No CP/SOB/ABD pain. No N/V/D. No cough/congestion. No URI symptoms. No fevers. No other symptoms noted.   Past Medical History:  Diagnosis Date  . Acute kidney injury ([place]) 05/15/2016  . Amenorrhea   . Anemia    SICKLE CELL  . Asthma   . Drug-induced pruritus 02/26/2015  . GERD (gastroesophageal reflux disease)   . Headache   . Hyperbilirubinemia 02/26/2015  . Sickle cell disease [facility])     Patient Active Problem List   Diagnosis Date Noted  . Homelessness 02/12/2017  . Protein-calorie malnutrition, severe 10/21/2016  . Anemia due to other cause   . Hb-SS disease without crisis ([place])   . Hereditary hemolytic anemia ([place])   . 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 Tobacco abuse   . Asthma, mild intermittent 12/14/2010    Past Surgical History:  Procedure Laterality Date  . 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 TONSILLECTOMY     Age 73    OB History    No data available       Home Medications    Prior to Admission medications   Medication Sig Start Date End Date Taking? Authorizing Provider  acetaminophen (TYLENOL) 500 MG tablet Take 2,000 mg by mouth daily as needed for headache (pain).    [provider]  cyclobenzaprine (FLEXERIL) 5 MG tablet Take 1 tablet (5 mg total) by mouth 2 (two) times daily as  needed for muscle spasms. Patient not taking: Reported on 06/10/2017 05/14/17   Drenda Freeze, MD  folic acid (FOLVITE) 1 MG tablet Take 1 tablet (1 mg total) by mouth daily. Patient not taking: Reported on 08/07/2016 03/11/15   Vivi Barrack, MD  ibuprofen (ADVIL,MOTRIN) 600 MG tablet Take 1 tablet (600 mg total) by mouth every 6 (six) hours as needed. Patient not taking: Reported on 06/10/2017 05/14/17   Drenda Freeze, MD  methocarbamol (ROBAXIN) 500 MG tablet Take 2 tablets (1,000 mg total) by mouth 4 (four) times daily as needed (Pain). 06/10/17   Pisciotta, Elmyra Ricks, PA-C  naproxen (NAPROSYN) 375 MG tablet Take 1 tablet (375 mg total) by mouth 2 (two) times daily. Patient not taking: Reported on 10/19/2016 08/07/16   Margarita Mail, PA-C  oxyCODONE (OXY IR/ROXICODONE) 5 MG immediate release tablet Take 1 tablet (5 mg total) by mouth every 6 (six) hours as needed for severe pain. Patient not taking: Reported on 06/10/2017 04/20/17   Leana Gamer, MD    Family History Family History  Problem Relation Age of Onset  . Hypertension Paternal Grandfather   . Sickle cell trait Father   . Cancer Mother        Died in 2009-02-21    Social History Social History  Substance Use Topics  .  Smoking status: Current Every Day Smoker    Packs/day: 0.50    Types: Cigarettes  . Smokeless tobacco: Never Used  . Alcohol use No     Comment: Denies 06/12/2014     Allergies   Patient has no known allergies.   Review of Systems Review of Systems ROS reviewed and all are negative for acute change except as noted in the HPI.  Physical Exam Updated Vital Signs BP (!) 96/51 (BP Location: Right Arm)   Pulse 71   Temp 98.6 F (37 C) (Oral)   Resp 20   Ht 5\' 4"  (1.626 m)   Wt 51.3 kg (113 lb)   SpO2 100%   BMI 19.40 kg/m   Physical Exam  Constitutional: She is oriented to person, place, and time. Vital signs are normal. She appears well-developed and well-nourished. No distress.    HENT:  Head: Normocephalic and atraumatic.  Right Ear: Hearing, tympanic membrane, external ear and ear canal normal.  Left Ear: Hearing, tympanic membrane, external ear and ear canal normal.  Nose: Nose normal.  Mouth/Throat: Uvula is midline, oropharynx is clear and moist and mucous membranes are normal. No trismus in the jaw. No oropharyngeal exudate, posterior oropharyngeal erythema or tonsillar abscesses.  Eyes: Pupils are equal, round, and reactive to light. Conjunctivae and EOM are normal.  Scleral Icterus  Neck: Normal range of motion. Neck supple. No tracheal deviation present.  Cardiovascular: Normal rate, regular rhythm, S1 normal, S2 normal, normal heart sounds, intact distal pulses and normal pulses.   Pulmonary/Chest: Effort normal and breath sounds normal. No respiratory distress. She has no decreased breath sounds. She has no wheezes. She has no rhonchi. She has no rales.  Abdominal: Soft. Normal appearance and bowel sounds are normal. There is no tenderness. There is no rigidity, no rebound, no guarding, no CVA tenderness, no tenderness at McBurney's point and negative Murphy's sign.  Musculoskeletal: Normal range of motion.  BLE exam unremarkable. Motor/sensation intact.   Neurological: She is alert and oriented to person, place, and time.  Skin: Skin is warm and dry.  Psychiatric: She has a normal mood and affect. Her speech is normal and behavior is normal. Thought content normal.  Nursing note and vitals reviewed.  ED Treatments / Results  Labs (all labs ordered are listed, but only abnormal results are displayed) Labs Reviewed  COMPREHENSIVE METABOLIC PANEL - Abnormal; Notable for the following:       Result Value   Potassium 3.3 (*)    Glucose, Bld 118 (*)    ALT 12 (*)    Total Bilirubin 7.2 (*)    All other components within normal limits  CBC WITH DIFFERENTIAL/PLATELET - Abnormal; Notable for the following:    WBC 12.1 (*)    RBC 2.51 (*)    Hemoglobin 8.7  (*)    HCT 23.5 (*)    MCH 34.7 (*)    MCHC 37.0 (*)    RDW 20.7 (*)    Platelets 530 (*)    Lymphs Abs 5.2 (*)    Monocytes Absolute 1.1 (*)    All other components within normal limits  RETICULOCYTES - Abnormal; Notable for the following:    Retic Ct Pct 14.8 (*)    RBC. 2.51 (*)    Retic Count, Absolute 371.5 (*)    All other components within normal limits    EKG  EKG Interpretation None       Radiology No results found.  Procedures Procedures (including critical care  time)  Medications Ordered in ED Medications  dextrose 5 %-0.45 % sodium chloride infusion ( Intravenous New Bag/Given 07/08/17 2218)  ondansetron (ZOFRAN) injection 4 mg (not administered)  sodium chloride 0.9 % bolus 1,000 mL (0 mLs Intravenous Stopped 07/08/17 2318)  ketorolac (TORADOL) 30 MG/ML injection 30 mg (30 mg Intravenous Given 07/08/17 2222)  diphenhydrAMINE (BENADRYL) injection 25 mg (25 mg Intravenous Given 07/08/17 2223)  HYDROmorphone (DILAUDID) injection 0.5 mg (0.5 mg Intravenous Given 07/08/17 2223)    Or  HYDROmorphone (DILAUDID) injection 0.5 mg ( Subcutaneous See Alternative 07/08/17 2223)     Initial Impression / Assessment and Plan / ED Course  I have reviewed the triage vital signs and the nursing notes.  Pertinent labs & imaging results that were available during my care of the patient were reviewed by me and considered in my medical decision making (see chart for details).  Final Clinical Impressions(s) / ED Diagnoses  {I have reviewed and evaluated the relevant laboratory values.   {I have reviewed the relevant previous healthcare records.  {I obtained HPI from historian.   ED Course:  Assessment: Pt is a 21 y.o. female with a hx of Sickle Cell Anemia, Homelessness presents to the Emergency Department today via EMS due to sickle cell crisis. Notes bilateral leg pain. States that this is typical for her sickle cell pain crisis. Pt is homeless and does not have a PCP. Does not take  home pain medication for her sickle cell anemia. Notes pain occurred x 1 hour ago. No CP/SOB/ABD pain. No N/V/D. No cough/congestion. No URI symptoms. No fevers. On exam, pt in NAD. Nontoxic/nonseptic appearing. VSS. Afebrile. Lungs CTA. Heart RRR. Abdomen nontender soft. BLE exam unremarkable. CBC unremarkable. CMP baseline. Noted elevated bili, but appears this is baseline for her. LFTs unremarkable. No abdominal pain. Discussed with attending physician. Retic baseline. Given analgesia per sickle cell protocol in ED. On reexamination after 0.5mg  Dilaudid and Toradol, pt was asleep and did not have any pain. Plan is to DC home with [facility]. Will ambulate in ED. Pt able to tolerate fluids.   Disposition/Plan:  DC Home Additional Verbal discharge instructions given and discussed with patient.  Pt Instructed to f/u with PCP in the next week for evaluation and treatment of symptoms. Return precautions given Pt acknowledges and agrees with plan  Supervising Physician Carmin Muskrat, MD  Final diagnoses:  Sickle cell pain crisis [facility])    New Prescriptions New Prescriptions   No medications on file     Shary Decamp, Hershal Coria 07/09/17 0029    Carmin Muskrat, MD 07/16/17 1610

## 2017-07-08 NOTE — ED Notes (Signed)
Bed: RV20 Expected date:  Expected time:  Means of arrival:  Comments: EMS 21 yo female sickle cell crisis

## 2017-07-08 NOTE — Discharge Instructions (Signed)
Please read and follow all provided instructions.  Your diagnoses today include:  1. Sickle cell pain crisis ([place])     Tests performed today include: Vital signs. See below for your results today.   Medications prescribed:  Take as prescribed   Home care instructions:  Follow any educational materials contained in this packet.  Follow-up instructions: Please follow-up with the [facility] for further evaluation of symptoms and treatment   Return instructions:  Please return to the Emergency Department if you do not get better, if you get worse, or new symptoms OR  - Fever (temperature greater than 101.34F)  - Bleeding that does not stop with holding pressure to the area    -Severe pain (please note that you may be more sore the day after your accident)  - Chest Pain  - Difficulty breathing  - Severe nausea or vomiting  - Inability to tolerate food and liquids  - Passing out  - Skin becoming red around your wounds  - Change in mental status (confusion or lethargy)  - New numbness or weakness    Please return if you have any other emergent concerns.  Additional Information:  Your vital signs today were: BP 101/60 (BP Location: Right Arm)    Pulse 63    Temp 98.6 F (37 C) (Oral)    Resp 16    Ht 5\' 4"  (1.626 m)    Wt 51.3 kg (113 lb)    SpO2 99%    BMI 19.40 kg/m  If your blood pressure (BP) was elevated above 135/85 this visit, please have this repeated by your doctor within one month. ---------------

## 2017-07-08 NOTE — ED Triage Notes (Signed)
Patient arrives by EMS with complaints of sickle cell crisis/bilateral leg pain. Symptoms started 1 hour ago. Patient under treatment for UTI. Patient is also jaundiced per EMS. BP 98/62 HR80 RR16.

## 2017-07-09 DIAGNOSIS — D5700 Hb-SS disease with crisis, unspecified: Secondary | ICD-10-CM | POA: Diagnosis not present

## 2017-07-09 NOTE — ED Notes (Signed)
Pt unable to ambulate at this time---- pt very drowsy and sleepy; pt responds to voice and touch but falls right back to sleep.

## 2017-07-09 NOTE — ED Notes (Signed)
Pt ambulated well to the bathroom  

## 2017-07-09 NOTE — ED Provider Notes (Signed)
H/o sickle cell, homelessness on no medications Bilateral leg pain 0.5 dilaudid, toradol - now sleeping Patient has been discharged but is somnolent. When reasonable for discharge she can leave.  Plan: re-evaluate, ambulate, encourage discharge  Patient is allowed to sleep most of the night. She remains stable with VSS. She eventually woke and ambulated steadily to the bathroom. She is considered stable for discharge.    Charlann Lange, PA-C 07/09/17 0981    Merryl Hacker, MD 07/10/17 2252

## 2017-07-26 ENCOUNTER — Emergency Department (HOSPITAL_COMMUNITY)
Admission: EM | Admit: 2017-07-26 | Discharge: 2017-07-26 | Disposition: A | Discharge status: Home / Self Care | Payer: Medicare Other | Attending: Emergency Medicine | Admitting: Emergency Medicine

## 2017-07-26 ENCOUNTER — Encounter (HOSPITAL_COMMUNITY): Payer: Self-pay | Admitting: Emergency Medicine

## 2017-07-26 DIAGNOSIS — F17210 Nicotine dependence, cigarettes, uncomplicated: Secondary | ICD-10-CM | POA: Diagnosis not present

## 2017-07-26 DIAGNOSIS — Z590 Homelessness: Secondary | ICD-10-CM | POA: Insufficient documentation

## 2017-07-26 DIAGNOSIS — D5700 Hb-SS disease with crisis, unspecified: Secondary | ICD-10-CM

## 2017-07-26 DIAGNOSIS — D571 Sickle-cell disease without crisis: Secondary | ICD-10-CM | POA: Diagnosis not present

## 2017-07-26 DIAGNOSIS — M79606 Pain in leg, unspecified: Secondary | ICD-10-CM | POA: Insufficient documentation

## 2017-07-26 DIAGNOSIS — J45909 Unspecified asthma, uncomplicated: Secondary | ICD-10-CM | POA: Insufficient documentation

## 2017-07-26 DIAGNOSIS — D57419 Sickle-cell thalassemia with crisis, unspecified: Secondary | ICD-10-CM | POA: Diagnosis present

## 2017-07-26 LAB — COMPREHENSIVE METABOLIC PANEL
ALT: 26 U/L (ref 14–54)
AST: 41 U/L (ref 15–41)
Albumin: 4 g/dL (ref 3.5–5.0)
Alkaline Phosphatase: 65 U/L (ref 38–126)
Anion gap: 9 (ref 5–15)
BILIRUBIN TOTAL: 3.3 mg/dL — AB (ref 0.3–1.2)
BUN: 9 mg/dL (ref 6–20)
CO2: 24 mmol/L (ref 22–32)
CREATININE: 0.98 mg/dL (ref 0.44–1.00)
Calcium: 8.7 mg/dL — ABNORMAL LOW (ref 8.9–10.3)
Chloride: 103 mmol/L (ref 101–111)
GFR calc Af Amer: >60 mL/min (ref >60)
Glucose, Bld: 95 mg/dL (ref 65–99)
POTASSIUM: 3.5 mmol/L (ref 3.5–5.1)
Sodium: 136 mmol/L (ref 135–145)
TOTAL PROTEIN: 7.2 g/dL (ref 6.5–8.1)

## 2017-07-26 LAB — CBC WITH DIFFERENTIAL/PLATELET
BASOS ABS: 0 10*3/uL (ref 0.0–0.1)
BASOS PCT: 0 %
EOS ABS: 0 10*3/uL (ref 0.0–0.7)
EOS PCT: 0 %
HCT: 20.5 % — ABNORMAL LOW (ref 36.0–46.0)
Hemoglobin: 7.5 g/dL — ABNORMAL LOW (ref 12.0–15.0)
Lymphocytes Relative: 46 %
Lymphs Abs: 5.2 10*3/uL — ABNORMAL HIGH (ref 0.7–4.0)
MCH: 33.8 pg (ref 26.0–34.0)
MCHC: 36.6 g/dL — ABNORMAL HIGH (ref 30.0–36.0)
MCV: 92.3 fL (ref 78.0–100.0)
Monocytes Absolute: 1.4 10*3/uL — ABNORMAL HIGH (ref 0.1–1.0)
Monocytes Relative: 12 %
Neutro Abs: 4.8 10*3/uL (ref 1.7–7.7)
Neutrophils Relative %: 42 %
PLATELETS: 489 10*3/uL — AB (ref 150–400)
RBC: 2.22 MIL/uL — AB (ref 3.87–5.11)
RDW: 20.2 % — ABNORMAL HIGH (ref 11.5–15.5)
WBC: 11.3 10*3/uL — AB (ref 4.0–10.5)

## 2017-07-26 LAB — RETICULOCYTES
RBC.: 2.22 MIL/uL — ABNORMAL LOW (ref 3.87–5.11)
RETIC CT PCT: 14.3 % — AB (ref 0.4–3.1)
Retic Count, Absolute: 317.5 10*3/uL — ABNORMAL HIGH (ref 19.0–186.0)

## 2017-07-26 MED ORDER — OXYCODONE-ACETAMINOPHEN 5-325 MG PO TABS
ORAL_TABLET | ORAL | Status: AC
  Filled 2017-07-26: qty 1

## 2017-07-26 MED ORDER — IBUPROFEN 600 MG PO TABS: 600.0000 mg | ORAL_TABLET | Freq: Four times a day (QID) | ORAL | 0 refills | Status: DC | PRN

## 2017-07-26 MED ORDER — KETOROLAC TROMETHAMINE 15 MG/ML IJ SOLN: 15.0000 mg | INTRAMUSCULAR | Status: DC

## 2017-07-26 MED ORDER — OXYCODONE-ACETAMINOPHEN 5-325 MG PO TABS
1.0000 | ORAL_TABLET | Freq: Once | ORAL | Status: AC
  Administered 2017-07-26: 1 via ORAL

## 2017-07-26 MED ORDER — SODIUM CHLORIDE 0.45 % IV SOLN: INTRAVENOUS | Status: DC

## 2017-07-26 MED ORDER — DIPHENHYDRAMINE HCL 25 MG PO CAPS
25.0000 mg | ORAL_CAPSULE | ORAL | Status: DC | PRN
  Administered 2017-07-26: 25 mg via ORAL
  Filled 2017-07-26: qty 1

## 2017-07-26 MED ORDER — PROMETHAZINE HCL 25 MG PO TABS
25.0000 mg | ORAL_TABLET | ORAL | Status: DC | PRN
  Administered 2017-07-26: 25 mg via ORAL
  Filled 2017-07-26: qty 1

## 2017-07-26 NOTE — ED Triage Notes (Signed)
Pt reports pain in legs for a day; has taken extra strength tylenol. Does not have doctor her manages her sickle cell disease. Reports was at [facility] a few weeks ago for crisis.

## 2017-07-26 NOTE — ED Provider Notes (Signed)
[facility] DEPT Provider Note   CSN: [id number] Arrival date & time: 07/26/17  1003     History   Chief Complaint Chief Complaint  Patient presents with  . Sickle Cell Pain Crisis    HPI [name] is a 21 y.o. female.  HPI   21 year old female who is homeless with history of sickle cell anemia presenting complaining of sickle cell related pain. Patient reports gradual onset of headache, bilateral thigh pain since yesterday. Pain is sharp, throbbing, intense, similar to prior sickle cell related pain. She felt that the pain is related to cold air which usually triggers her sickle cell pain crisis. She denies having associated fever, chills, URI symptoms, stiffness, chest pain, short of breath, productive cough, abdominal pain, nausea vomiting diarrhea, dysuria or rash. Patient report taking extra strength Tylenol for pain with minimal relief. She was brought here by bus and patient was found to be somnolent upon initial exam. Patient states she hasn't been able to get much sleep due to being homeless. She is requesting for [place] sandwiches.  Patient states she does not have a primary care provider or the sickle cell specialist. She normally would experience sickle cell related flare 10 times a year, last episode was a month ago. She reported not taking any medication for her sickle cell related pain.   Past Medical History:  Diagnosis Date  . Acute kidney injury ([place]) 05/15/2016  . Amenorrhea   . Anemia    SICKLE CELL  . Asthma   . Drug-induced pruritus 02/26/2015  . GERD (gastroesophageal reflux disease)   . Headache   . Hyperbilirubinemia 02/26/2015  . Sickle cell disease [facility])     Patient Active Problem List   Diagnosis Date Noted  . Homelessness 02/12/2017  . Protein-calorie malnutrition, severe 10/21/2016  . Anemia due to other cause   . Hb-SS disease without crisis ([place])   . Hereditary hemolytic anemia ([place])   . Other depression due to general medical  condition 03/11/2015  . Tobacco abuse   . Asthma, mild intermittent 12/14/2010    Past Surgical History:  Procedure Laterality Date  . 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 TONSILLECTOMY     Age 9    OB History    No data available       Home Medications    Prior to Admission medications   Medication Sig Start Date End Date Taking? Authorizing Provider  acetaminophen (TYLENOL) 500 MG tablet Take 2,000 mg by mouth daily as needed for headache (pain).    [provider]  cyclobenzaprine (FLEXERIL) 5 MG tablet Take 1 tablet (5 mg total) by mouth 2 (two) times daily as needed for muscle spasms. Patient not taking: Reported on 06/10/2017 05/14/17   Drenda Freeze, MD  folic acid (FOLVITE) 1 MG tablet Take 1 tablet (1 mg total) by mouth daily. Patient not taking: Reported on 08/07/2016 03/11/15   Vivi Barrack, MD  ibuprofen (ADVIL,MOTRIN) 600 MG tablet Take 1 tablet (600 mg total) by mouth every 6 (six) hours as needed. Patient not taking: Reported on 06/10/2017 05/14/17   Drenda Freeze, MD  methocarbamol (ROBAXIN) 500 MG tablet Take 2 tablets (1,000 mg total) by mouth 4 (four) times daily as needed (Pain). 06/10/17   Pisciotta, Elmyra Ricks, PA-C  naproxen (NAPROSYN) 375 MG tablet Take 1 tablet (375 mg total) by mouth 2 (two) times daily. Patient not taking: Reported on 10/19/2016 08/07/16   Margarita Mail, PA-C  oxyCODONE (  OXY IR/ROXICODONE) 5 MG immediate release tablet Take 1 tablet (5 mg total) by mouth every 6 (six) hours as needed for severe pain. Patient not taking: Reported on 06/10/2017 04/20/17   Leana Gamer, MD    Family History Family History  Problem Relation Age of Onset  . Hypertension Paternal Grandfather   . Sickle cell trait Father   . Cancer Mother        Died in 02-19-09    Social History Social History  Substance Use Topics  . Smoking status: Current Every Day Smoker    Packs/day: 0.50    Types: Cigarettes  . Smokeless tobacco:  Never Used  . Alcohol use No     Comment: Denies 06/12/2014     Allergies   Patient has no known allergies.   Review of Systems Review of Systems  All other systems reviewed and are negative.    Physical Exam Updated Vital Signs BP 102/65 (BP Location: Right Arm)   Pulse (!) 101   Temp 98.3 F (36.8 C) (Oral)   Resp 19   LMP 06/26/2017   SpO2 95%   Physical Exam  Constitutional: She is oriented to person, place, and time. She appears well-developed and well-nourished.  Patient is somnolent but easily arousable and answers questions appropriately  HENT:  Head: Atraumatic.  Mouth/Throat: Oropharynx is clear and moist.  Eyes: Conjunctivae are normal.  Neck: Normal range of motion. Neck supple.  No nuchal rigidity  Cardiovascular: Normal rate and regular rhythm.   Pulmonary/Chest: Effort normal and breath sounds normal.  Abdominal: Soft. Bowel sounds are normal. She exhibits no distension. There is no tenderness.  Neurological: She is oriented to person, place, and time. GCS eye subscore is 4. GCS verbal subscore is 5. GCS motor subscore is 6.  Skin: No rash noted.  Psychiatric: She has a normal mood and affect.  Nursing note and vitals reviewed.    ED Treatments / Results  Labs (all labs ordered are listed, but only abnormal results are displayed) Labs Reviewed  COMPREHENSIVE METABOLIC PANEL - Abnormal; Notable for the following:       Result Value   Calcium 8.7 (*)    Total Bilirubin 3.3 (*)    All other components within normal limits  CBC WITH DIFFERENTIAL/PLATELET - Abnormal; Notable for the following:    WBC 11.3 (*)    RBC 2.22 (*)    Hemoglobin 7.5 (*)    HCT 20.5 (*)    MCHC 36.6 (*)    RDW 20.2 (*)    Platelets 489 (*)    Lymphs Abs 5.2 (*)    Monocytes Absolute 1.4 (*)    All other components within normal limits  RETICULOCYTES - Abnormal; Notable for the following:    Retic Ct Pct 14.3 (*)    RBC. 2.22 (*)    Retic Count, Absolute 317.5 (*)     All other components within normal limits  POC URINE PREG, ED    EKG  EKG Interpretation None       Radiology No results found.  Procedures Procedures (including critical care time)  Medications Ordered in ED Medications  oxyCODONE-acetaminophen (PERCOCET/ROXICET) 5-325 MG per tablet (not administered)  0.45 % sodium chloride infusion (not administered)  ketorolac (TORADOL) 15 MG/ML injection 15 mg (not administered)  diphenhydrAMINE (BENADRYL) capsule 25-50 mg (25 mg Oral Given 07/26/17 1234)  promethazine (PHENERGAN) tablet 25 mg (25 mg Oral Given 07/26/17 1234)  oxyCODONE-acetaminophen (PERCOCET/ROXICET) 5-325 MG per tablet 1 tablet (1  tablet Oral Given 07/26/17 1012)     Initial Impression / Assessment and Plan / ED Course  I have reviewed the triage vital signs and the nursing notes.  Pertinent labs & imaging results that were available during my care of the patient were reviewed by me and considered in my medical decision making (see chart for details).     BP 99/61   Pulse 85   Temp 98.3 F (36.8 C) (Oral)   Resp 19   LMP 06/26/2017   SpO2 100%    Final Clinical Impressions(s) / ED Diagnoses   Final diagnoses:  Sickle cell anemia with pain ([facility])    New Prescriptions New Prescriptions   No medications on file   12:25 PM Patient with history of sickle cell anemia here with sickle cell related pain. No plans of fever or productive cough to suggest acute chest. She appears to be opiate nave since she does not take opiate medication on a regular basis. She was found to be very drowsy, initial exam. Patient has received 1 Percocet while in the waiting room. Plan to be very judicious in her pain management. Workup initiated.  3:08 PM Patient slept throughout the entire ER visit. She is however arousable. She does not want any IV medication at this time and requested discharge. Her labs at her baseline. Hemoglobin is 7.5 symptoms prior. Elevated reticulocyte  count.  Electrolytes Panel are reassuring. Does have elevated total bilirubin, improves from prior.  I encourage pt to f/u with [facility] for further management.     Domenic Moras, PA-C 07/26/17 1510    Daleen Bo, MD 07/27/17 [phone]

## 2017-07-26 NOTE — ED Notes (Signed)
Patient is resting comfortably. 

## 2017-07-26 NOTE — ED Notes (Signed)
Pt refusing IV at this time.

## 2017-11-20 ENCOUNTER — Other Ambulatory Visit: Payer: Self-pay

## 2017-11-20 ENCOUNTER — Encounter (HOSPITAL_COMMUNITY): Payer: Self-pay

## 2017-11-20 ENCOUNTER — Emergency Department (HOSPITAL_COMMUNITY)
Admission: EM | Admit: 2017-11-20 | Discharge: 2017-11-20 | Disposition: A | Discharge status: Home / Self Care | Payer: Medicare Other | Attending: Emergency Medicine | Admitting: Emergency Medicine

## 2017-11-20 DIAGNOSIS — F17210 Nicotine dependence, cigarettes, uncomplicated: Secondary | ICD-10-CM | POA: Insufficient documentation

## 2017-11-20 DIAGNOSIS — J45909 Unspecified asthma, uncomplicated: Secondary | ICD-10-CM | POA: Insufficient documentation

## 2017-11-20 DIAGNOSIS — D5700 Hb-SS disease with crisis, unspecified: Secondary | ICD-10-CM | POA: Insufficient documentation

## 2017-11-20 DIAGNOSIS — M7918 Myalgia, other site: Secondary | ICD-10-CM | POA: Diagnosis present

## 2017-11-20 LAB — CBC WITH DIFFERENTIAL/PLATELET
BASOS ABS: 0.1 10*3/uL (ref 0.0–0.1)
BASOS PCT: 0 %
Eosinophils Absolute: 0.1 10*3/uL (ref 0.0–0.7)
Eosinophils Relative: 1 %
HCT: 23.4 % — ABNORMAL LOW (ref 36.0–46.0)
HEMOGLOBIN: 8.3 g/dL — AB (ref 12.0–15.0)
Lymphocytes Relative: 43 %
Lymphs Abs: 5.2 10*3/uL — ABNORMAL HIGH (ref 0.7–4.0)
MCH: 35.9 pg — ABNORMAL HIGH (ref 26.0–34.0)
MCHC: 35.5 g/dL (ref 30.0–36.0)
MCV: 101.3 fL — ABNORMAL HIGH (ref 78.0–100.0)
MONOS PCT: 10 %
Monocytes Absolute: 1.2 10*3/uL — ABNORMAL HIGH (ref 0.1–1.0)
NEUTROS PCT: 46 %
Neutro Abs: 5.7 10*3/uL (ref 1.7–7.7)
Platelets: 559 10*3/uL — ABNORMAL HIGH (ref 150–400)
RBC: 2.31 MIL/uL — AB (ref 3.87–5.11)
RDW: 18.8 % — ABNORMAL HIGH (ref 11.5–15.5)
WBC: 12.3 10*3/uL — AB (ref 4.0–10.5)

## 2017-11-20 LAB — RETICULOCYTES
RBC.: 2.31 MIL/uL — AB (ref 3.87–5.11)
RETIC COUNT ABSOLUTE: 341.9 10*3/uL — AB (ref 19.0–186.0)
RETIC CT PCT: 14.8 % — AB (ref 0.4–3.1)

## 2017-11-20 LAB — COMPREHENSIVE METABOLIC PANEL
ALK PHOS: 52 U/L (ref 38–126)
ALT: 15 U/L (ref 14–54)
ANION GAP: 4 — AB (ref 5–15)
AST: 27 U/L (ref 15–41)
Albumin: 4 g/dL (ref 3.5–5.0)
BUN: 7 mg/dL (ref 6–20)
CALCIUM: 8.9 mg/dL (ref 8.9–10.3)
CO2: 26 mmol/L (ref 22–32)
Chloride: 107 mmol/L (ref 101–111)
Creatinine, Ser: 0.72 mg/dL (ref 0.44–1.00)
Glucose, Bld: 112 mg/dL — ABNORMAL HIGH (ref 65–99)
Potassium: 4 mmol/L (ref 3.5–5.1)
SODIUM: 137 mmol/L (ref 135–145)
TOTAL PROTEIN: 7.3 g/dL (ref 6.5–8.1)
Total Bilirubin: 5.7 mg/dL — ABNORMAL HIGH (ref 0.3–1.2)

## 2017-11-20 LAB — I-STAT BETA HCG BLOOD, ED (MC, WL, AP ONLY)

## 2017-11-20 MED ORDER — HYDROMORPHONE HCL 1 MG/ML IJ SOLN
1.0000 mg | INTRAMUSCULAR | Status: AC
  Administered 2017-11-20: 1 mg via INTRAVENOUS
  Filled 2017-11-20: qty 1

## 2017-11-20 MED ORDER — HYDROMORPHONE HCL 1 MG/ML IJ SOLN
1.0000 mg | INTRAMUSCULAR | Status: AC
  Administered 2017-11-20: 1 mg via INTRAVENOUS

## 2017-11-20 MED ORDER — KETOROLAC TROMETHAMINE 15 MG/ML IJ SOLN
15.0000 mg | Freq: Once | INTRAMUSCULAR | Status: AC
  Administered 2017-11-20: 15 mg via INTRAVENOUS
  Filled 2017-11-20: qty 1

## 2017-11-20 MED ORDER — SODIUM CHLORIDE 0.45 % IV SOLN
INTRAVENOUS | Status: DC
  Administered 2017-11-20: 11:00:00 via INTRAVENOUS

## 2017-11-20 MED ORDER — HYDROMORPHONE HCL 1 MG/ML IJ SOLN
0.5000 mg | INTRAMUSCULAR | Status: AC
  Administered 2017-11-20: 0.5 mg via INTRAVENOUS
  Filled 2017-11-20: qty 1

## 2017-11-20 MED ORDER — ONDANSETRON HCL 4 MG/2ML IJ SOLN
4.0000 mg | INTRAMUSCULAR | Status: DC | PRN
  Administered 2017-11-20: 4 mg via INTRAVENOUS
  Filled 2017-11-20: qty 2

## 2017-11-20 MED ORDER — HYDROMORPHONE HCL 1 MG/ML IJ SOLN
1.0000 mg | INTRAMUSCULAR | Status: AC
  Filled 2017-11-20: qty 1

## 2017-11-20 MED ORDER — DIPHENHYDRAMINE HCL 25 MG PO CAPS
25.0000 mg | ORAL_CAPSULE | ORAL | Status: DC | PRN
  Administered 2017-11-20: 50 mg via ORAL
  Filled 2017-11-20: qty 2

## 2017-11-20 NOTE — ED Notes (Signed)
PT SITTING IN [facility] WAITING FOR RIDE. PT REQUESTING AN ADDITIONAL SANDWICH. INFORMED PT I WOULD BE HAPPY TO GIVE HER PEANUT BUTTE RAND GRAHAM CRACKERS WITH JUICE WHILE SHE WAITED. CHARGE MATT WITNESSED. PT GOT UP AND STARTED TO WALK TOWARDS TRIAGE AREA UNASSISTED. ENCOURAGED PT TO STAY IN HALLWAY B AND WAIT FOR RIDE. PT STATES SHE WOULD LIKE SNACKS FROM TRIAGE. PT DECLINED TO WAIT OR USE WHEELCHAIR PROVIDED. CHARGE MATT RN AWARE. PT DISCHARGED.

## 2017-11-20 NOTE — ED Notes (Addendum)
PT HAS CALLED FOR A RIDE HOWEVER PT IS TOO SEDATED TO BE PLACED IN TRIAGE UNTIL RIDE ARRIVES. WILL MOVE TO ROOM B. THIS WRITER CALLED PER PT'S REQUEST THE NUMBER PROVIDED FOR A  RIDE. MESSAGE LEFT

## 2017-11-20 NOTE — ED Notes (Signed)
ED Provider at bedside. [place] 

## 2017-11-20 NOTE — ED Notes (Signed)
PT WILL CALL BOYFRIEND FOR LUNCH. PT HAS HAD 2 SANDWICHES

## 2017-11-20 NOTE — ED Notes (Signed)
ED Provider at bedside. EDP [place] MADE AWARE OF PT CURRENT STATUS

## 2017-11-20 NOTE — ED Notes (Signed)
RIDE CALLED. WILL BE 30 MINUTES

## 2017-11-20 NOTE — ED Triage Notes (Signed)
Per EMS- Patient c/o sickle cell pain-lower back, and all 4 extremities. Patient denies SOB or chest pain. Patient was given 100 ml NS and Fentanyl 50 mcg prior to arrival to the ED.

## 2017-11-20 NOTE — ED Notes (Signed)
ED Provider at bedside. 

## 2017-11-20 NOTE — ED Notes (Signed)
SODA, GRAHAM CRACKERS AND PEANUT BUTTER GIVEN

## 2017-11-20 NOTE — ED Provider Notes (Signed)
[facility] DEPT Provider Note   CSN: [id number] Arrival date & time: 11/20/17  0901     History   Chief Complaint Chief Complaint  Patient presents with  . Sickle Cell Pain Crisis    HPI [name] is a 22 y.o. female.  HPI  22 year old female presents with recurrent extremity pain consistent with her prior sickle cell pain crises.  This started yesterday.  She states she does not have any medicine at home to take including no narcotics.  Sometimes she takes extra strength Tylenol but has not this time.  The pain is in all 4 extremities and this is a recurrent issue.  She also has a low back pain which she states is chronic.  She denies any new or different symptoms from prior crises including no fever, cough, shortness of breath, abdominal pain, urinary symptoms.  Given IV fentanyl by EMS which she states helped and put her to sleep.  Currently her pain is rated as an 8/10.  Past Medical History:  Diagnosis Date  . Acute kidney injury ([place]) 05/15/2016  . Amenorrhea   . Anemia    SICKLE CELL  . Asthma   . Drug-induced pruritus 02/26/2015  . GERD (gastroesophageal reflux disease)   . Headache   . Hyperbilirubinemia 02/26/2015  . Sickle cell disease [facility])     Patient Active Problem List   Diagnosis Date Noted  . Homelessness 02/12/2017  . Protein-calorie malnutrition, severe 10/21/2016  . Anemia due to other cause   . Hb-SS disease without crisis ([place])   . Hereditary hemolytic anemia ([place])   . 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 Tobacco abuse   . Asthma, mild intermittent 12/14/2010    Past Surgical History:  Procedure Laterality Date  . 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 TONSILLECTOMY     Age 51    OB History    No data available       Home Medications    Prior to Admission medications   Medication Sig Start Date End Date Taking? Authorizing Provider  acetaminophen (TYLENOL) 500  MG tablet Take 2,000 mg by mouth daily as needed for headache (pain).   Yes [provider]  ibuprofen (ADVIL,MOTRIN) 600 MG tablet Take 1 tablet (600 mg total) by mouth every 6 (six) hours as needed. Patient not taking: Reported on 11/20/2017 07/26/17   Domenic Moras, PA-C  methocarbamol (ROBAXIN) 500 MG tablet Take 2 tablets (1,000 mg total) by mouth 4 (four) times daily as needed (Pain). Patient not taking: Reported on 11/20/2017 06/10/17   Pisciotta, Elmyra Ricks, PA-C    Family History Family History  Problem Relation Age of Onset  . Hypertension Paternal Grandfather   . Sickle cell trait Father   . Cancer Mother        Died in 02-04-2009    Social History Social History   Tobacco Use  . Smoking status: Current Every Day Smoker    Packs/day: 0.50    Types: Cigarettes  . Smokeless tobacco: Never Used  Substance Use Topics  . Alcohol use: No    Alcohol/week: 3.6 oz    Types: 6 Shots of liquor per week    Comment: Denies 06/12/2014  . Drug use: Yes    Types: Marijuana, Cocaine     Allergies   Patient has no known allergies.   Review of Systems Review of Systems  Constitutional: Negative for fever.  Respiratory: Negative for shortness  of breath.   Cardiovascular: Negative for chest pain.  Gastrointestinal: Negative for abdominal pain and vomiting.  Genitourinary: Negative for dysuria.  Musculoskeletal: Positive for back pain and myalgias.  Neurological: Negative for weakness and numbness.  All other systems reviewed and are negative.    Physical Exam Updated Vital Signs BP (!) 116/53   Pulse 80   Temp 98.4 F (36.9 C) (Oral)   Resp 14   Ht 5\' 4"  (1.626 m)   Wt 58.1 kg (128 lb)   LMP 11/28/2016 (Approximate)   SpO2 94%   BMI 21.97 kg/m   Physical Exam  Constitutional: She is oriented to person, place, and time. She appears well-developed and well-nourished. No distress.  HENT:  Head: Normocephalic and atraumatic.  Right Ear: External ear normal.  Left Ear:  External ear normal.  Nose: Nose normal.  Eyes: Right eye exhibits no discharge. Left eye exhibits no discharge.  Cardiovascular: Normal rate, regular rhythm and normal heart sounds.  Pulses:      Radial pulses are 2+ on the right side, and 2+ on the left side.       Dorsalis pedis pulses are 2+ on the right side, and 2+ on the left side.  Pulmonary/Chest: Effort normal and breath sounds normal.  Abdominal: Soft. She exhibits no distension. There is no tenderness.  Musculoskeletal:       Lumbar back: She exhibits tenderness.       Right upper arm: She exhibits tenderness.       Left upper arm: She exhibits tenderness.       Right upper leg: She exhibits tenderness.       Left upper leg: She exhibits tenderness.  Mild diffuse tenderness of bilateral upper extremities and bilateral lower extremities, more proximal than distal.  No joint swelling and normal range of motion of major joints in these extremities.  Mild diffuse low back tenderness.  Neurological: She is alert and oriented to person, place, and time.  Skin: Skin is warm and dry. She is not diaphoretic.  Nursing note and vitals reviewed.    ED Treatments / Results  Labs (all labs ordered are listed, but only abnormal results are displayed) Labs Reviewed  COMPREHENSIVE METABOLIC PANEL - Abnormal; Notable for the following components:      Result Value   Glucose, Bld 112 (*)    Total Bilirubin 5.7 (*)    Anion gap 4 (*)    All other components within normal limits  CBC WITH DIFFERENTIAL/PLATELET - Abnormal; Notable for the following components:   WBC 12.3 (*)    RBC 2.31 (*)    Hemoglobin 8.3 (*)    HCT 23.4 (*)    MCV 101.3 (*)    MCH 35.9 (*)    RDW 18.8 (*)    Platelets 559 (*)    Lymphs Abs 5.2 (*)    Monocytes Absolute 1.2 (*)    All other components within normal limits  RETICULOCYTES - Abnormal; Notable for the following components:   Retic Ct Pct 14.8 (*)    RBC. 2.31 (*)    Retic Count, Absolute 341.9 (*)     All other components within normal limits  I-STAT BETA HCG BLOOD, ED ([facility], [facility], [facility] ONLY)    EKG  EKG Interpretation None       Radiology No results found.  Procedures Procedures (including critical care time)  Medications Ordered in ED Medications  HYDROmorphone (DILAUDID) injection 1 mg (not administered)  HYDROmorphone (DILAUDID) injection 1 mg (  1 mg Intravenous Given 11/20/17 1331)  ondansetron (ZOFRAN) injection 4 mg (4 mg Intravenous Given 11/20/17 1122)  diphenhydrAMINE (BENADRYL) capsule 25-50 mg (50 mg Oral Given 11/20/17 1121)  0.45 % sodium chloride infusion ( Intravenous Stopped 11/20/17 1454)  HYDROmorphone (DILAUDID) injection 0.5 mg (0.5 mg Intravenous Given 11/20/17 1121)  HYDROmorphone (DILAUDID) injection 1 mg (1 mg Intravenous Given 11/20/17 1222)  ketorolac (TORADOL) 15 MG/ML injection 15 mg (15 mg Intravenous Given 11/20/17 1451)     Initial Impression / Assessment and Plan / ED Course  I have reviewed the triage vital signs and the nursing notes.  Pertinent labs & imaging results that were available during my care of the patient were reviewed by me and considered in my medical decision making (see chart for details).     Patient's pain is typical of her sickle cell pain crisis. She is feeling better after doses of hydromorphone and IV Toradol.  She has no concerning findings such as fevers, cough, or abdominal pain.  She initially had a low blood pressure but on recheck this was normal and has remained normal.  Given better pain control, I feel she is stable for discharge and follow-up with her sickle cell physician, Dr. Zigmund Daniel.  Return precautions.  Final Clinical Impressions(s) / ED Diagnoses   Final diagnoses:  Sickle cell crisis [facility])    ED Discharge Orders    None       Sherwood Gambler, MD 11/20/17 1521

## 2017-12-15 ENCOUNTER — Inpatient Hospital Stay (HOSPITAL_COMMUNITY)
Admission: EM | Admit: 2017-12-15 | Discharge: 2017-12-22 | DRG: 812 | Discharge status: Against Medical Advice | Payer: Medicare Other | Attending: Internal Medicine | Admitting: Internal Medicine

## 2017-12-15 ENCOUNTER — Other Ambulatory Visit: Payer: Self-pay

## 2017-12-15 ENCOUNTER — Encounter (HOSPITAL_COMMUNITY): Payer: Self-pay | Admitting: Emergency Medicine

## 2017-12-15 DIAGNOSIS — Z832 Family history of diseases of the blood and blood-forming organs and certain disorders involving the immune mechanism: Secondary | ICD-10-CM

## 2017-12-15 DIAGNOSIS — F1910 Other psychoactive substance abuse, uncomplicated: Secondary | ICD-10-CM | POA: Diagnosis not present

## 2017-12-15 DIAGNOSIS — E46 Unspecified protein-calorie malnutrition: Secondary | ICD-10-CM | POA: Diagnosis present

## 2017-12-15 DIAGNOSIS — Z9081 Acquired absence of spleen: Secondary | ICD-10-CM

## 2017-12-15 DIAGNOSIS — D72829 Elevated white blood cell count, unspecified: Secondary | ICD-10-CM | POA: Diagnosis present

## 2017-12-15 DIAGNOSIS — D5700 Hb-SS disease with crisis, unspecified: Principal | ICD-10-CM | POA: Diagnosis present

## 2017-12-15 DIAGNOSIS — K219 Gastro-esophageal reflux disease without esophagitis: Secondary | ICD-10-CM | POA: Diagnosis present

## 2017-12-15 DIAGNOSIS — F17210 Nicotine dependence, cigarettes, uncomplicated: Secondary | ICD-10-CM | POA: Diagnosis present

## 2017-12-15 DIAGNOSIS — J45909 Unspecified asthma, uncomplicated: Secondary | ICD-10-CM | POA: Diagnosis present

## 2017-12-15 DIAGNOSIS — M79605 Pain in left leg: Secondary | ICD-10-CM | POA: Diagnosis present

## 2017-12-15 DIAGNOSIS — D638 Anemia in other chronic diseases classified elsewhere: Secondary | ICD-10-CM | POA: Diagnosis present

## 2017-12-15 DIAGNOSIS — Z5321 Procedure and treatment not carried out due to patient leaving prior to being seen by health care provider: Secondary | ICD-10-CM | POA: Diagnosis present

## 2017-12-15 DIAGNOSIS — Z8249 Family history of ischemic heart disease and other diseases of the circulatory system: Secondary | ICD-10-CM

## 2017-12-15 DIAGNOSIS — M79604 Pain in right leg: Secondary | ICD-10-CM | POA: Diagnosis present

## 2017-12-15 DIAGNOSIS — K5900 Constipation, unspecified: Secondary | ICD-10-CM | POA: Diagnosis not present

## 2017-12-15 DIAGNOSIS — Z79899 Other long term (current) drug therapy: Secondary | ICD-10-CM

## 2017-12-15 DIAGNOSIS — R109 Unspecified abdominal pain: Secondary | ICD-10-CM

## 2017-12-15 DIAGNOSIS — D473 Essential (hemorrhagic) thrombocythemia: Secondary | ICD-10-CM | POA: Diagnosis present

## 2017-12-15 DIAGNOSIS — F1490 Cocaine use, unspecified, uncomplicated: Secondary | ICD-10-CM | POA: Diagnosis present

## 2017-12-15 DIAGNOSIS — Z681 Body mass index (BMI) 19 or less, adult: Secondary | ICD-10-CM

## 2017-12-15 DIAGNOSIS — Z590 Homelessness: Secondary | ICD-10-CM

## 2017-12-15 LAB — CBC WITH DIFFERENTIAL/PLATELET
Basophils Absolute: 0.2 10*3/uL — ABNORMAL HIGH (ref 0.0–0.1)
Basophils Relative: 1 %
EOS ABS: 0.2 10*3/uL (ref 0.0–0.7)
EOS PCT: 1 %
HCT: 25.2 % — ABNORMAL LOW (ref 36.0–46.0)
Hemoglobin: 9 g/dL — ABNORMAL LOW (ref 12.0–15.0)
Lymphocytes Relative: 23 %
Lymphs Abs: 3.6 10*3/uL (ref 0.7–4.0)
MCH: 35 pg — ABNORMAL HIGH (ref 26.0–34.0)
MCHC: 35.7 g/dL (ref 30.0–36.0)
MCV: 98.1 fL (ref 78.0–100.0)
MONO ABS: 1.2 10*3/uL — AB (ref 0.1–1.0)
Monocytes Relative: 8 %
Neutro Abs: 10.4 10*3/uL — ABNORMAL HIGH (ref 1.7–7.7)
Neutrophils Relative %: 67 %
PLATELETS: 674 10*3/uL — AB (ref 150–400)
RBC: 2.57 MIL/uL — AB (ref 3.87–5.11)
RDW: 17.8 % — AB (ref 11.5–15.5)
WBC: 15.6 10*3/uL — AB (ref 4.0–10.5)

## 2017-12-15 LAB — COMPREHENSIVE METABOLIC PANEL
ALK PHOS: 67 U/L (ref 38–126)
ALT: 13 U/L — AB (ref 14–54)
AST: 23 U/L (ref 15–41)
Albumin: 3.9 g/dL (ref 3.5–5.0)
Anion gap: 7 (ref 5–15)
BILIRUBIN TOTAL: 4 mg/dL — AB (ref 0.3–1.2)
BUN: 14 mg/dL (ref 6–20)
CALCIUM: 8.8 mg/dL — AB (ref 8.9–10.3)
CO2: 25 mmol/L (ref 22–32)
CREATININE: 0.81 mg/dL (ref 0.44–1.00)
Chloride: 105 mmol/L (ref 101–111)
GFR calc Af Amer: >60 mL/min (ref >60)
Glucose, Bld: 99 mg/dL (ref 65–99)
Potassium: 4.6 mmol/L (ref 3.5–5.1)
Sodium: 137 mmol/L (ref 135–145)
TOTAL PROTEIN: 7.9 g/dL (ref 6.5–8.1)

## 2017-12-15 LAB — I-STAT BETA HCG BLOOD, ED (MC, WL, AP ONLY)

## 2017-12-15 LAB — RETICULOCYTES
RBC.: 2.57 MIL/uL — ABNORMAL LOW (ref 3.87–5.11)
RETIC COUNT ABSOLUTE: 354.7 10*3/uL — AB (ref 19.0–186.0)
RETIC CT PCT: 13.8 % — AB (ref 0.4–3.1)

## 2017-12-15 MED ORDER — HYDROMORPHONE HCL 1 MG/ML IJ SOLN: 1.0000 mg | INTRAMUSCULAR | Status: AC

## 2017-12-15 MED ORDER — HYDROMORPHONE HCL 1 MG/ML IJ SOLN: 0.5000 mg | INTRAMUSCULAR | Status: AC

## 2017-12-15 MED ORDER — HYDROMORPHONE HCL 1 MG/ML IJ SOLN: 0.5000 mg | Freq: Once | INTRAMUSCULAR | Status: DC

## 2017-12-15 MED ORDER — SODIUM CHLORIDE 0.45 % IV SOLN
INTRAVENOUS | Status: DC
  Administered 2017-12-15: 21:00:00 via INTRAVENOUS

## 2017-12-15 MED ORDER — KETOROLAC TROMETHAMINE 30 MG/ML IJ SOLN
30.0000 mg | INTRAMUSCULAR | Status: AC
  Administered 2017-12-15: 30 mg via INTRAVENOUS
  Filled 2017-12-15: qty 1

## 2017-12-15 MED ORDER — HYDROMORPHONE HCL 1 MG/ML IJ SOLN
0.5000 mg | INTRAMUSCULAR | Status: AC
  Administered 2017-12-15: 0.5 mg via INTRAVENOUS
  Filled 2017-12-15: qty 1

## 2017-12-15 MED ORDER — HYDROMORPHONE HCL 1 MG/ML IJ SOLN
1.0000 mg | INTRAMUSCULAR | Status: AC
  Administered 2017-12-15: 1 mg via INTRAVENOUS
  Filled 2017-12-15: qty 1

## 2017-12-15 MED ORDER — DIPHENHYDRAMINE HCL 25 MG PO CAPS
25.0000 mg | ORAL_CAPSULE | Freq: Once | ORAL | Status: AC
  Administered 2017-12-16: 25 mg via ORAL
  Filled 2017-12-15: qty 1

## 2017-12-15 NOTE — ED Notes (Signed)
Pt was asleep in lobby when called. She is c/o pain in both legs and arms since last night.

## 2017-12-15 NOTE — ED Provider Notes (Signed)
[facility] DEPT Provider Note   CSN: [id number] Arrival date & time: 12/15/17  1842     History   Chief Complaint Chief Complaint  Patient presents with  . Sickle Cell Pain Crisis    HPI [name] is a 22 y.o. female.  HPI [name] is a 22 y.o. female with history of sickle cell anemia, asthma, kidney disease, presents to emergency department with complaint of pain to her legs and arms.  Patient states her symptoms began yesterday.  Pain is similar to prior sickle cell crisis.  Denies any fever or chills.  Denies any injuries.  Denies any chest pain or shortness of breath.  She currently does not take any medications for her sickle cell at home and has not tried anything prior to coming in.  She also states she does not have a primary care doctor at this time.  She has no associated symptoms  Past Medical History:  Diagnosis Date  . Acute kidney injury ([place]) 05/15/2016  . Amenorrhea   . Anemia    SICKLE CELL  . Asthma   . Drug-induced pruritus 02/26/2015  . GERD (gastroesophageal reflux disease)   . Headache   . Hyperbilirubinemia 02/26/2015  . Sickle cell disease [facility])     Patient Active Problem List   Diagnosis Date Noted  . Homelessness 02/12/2017  . Protein-calorie malnutrition, severe 10/21/2016  . Anemia due to other cause   . Hb-SS disease without crisis ([place])   . Hereditary hemolytic anemia ([place])   . 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 Tobacco abuse   . Asthma, mild intermittent 12/14/2010    Past Surgical History:  Procedure Laterality Date  . SPLENECTOMY     Age 67 for sequestration  . TONSILLECTOMY     Age 2    OB History    No data available       Home Medications    Prior to Admission medications   Medication Sig Start Date End Date Taking? Authorizing Provider  acetaminophen (TYLENOL) 500 MG tablet Take 2,000 mg by mouth daily as needed for headache (pain).     [provider]  ibuprofen (ADVIL,MOTRIN) 600 MG tablet Take 1 tablet (600 mg total) by mouth every 6 (six) hours as needed. Patient not taking: Reported on 11/20/2017 07/26/17   Domenic Moras, PA-C  methocarbamol (ROBAXIN) 500 MG tablet Take 2 tablets (1,000 mg total) by mouth 4 (four) times daily as needed (Pain). Patient not taking: Reported on 11/20/2017 06/10/17   Pisciotta, Elmyra Ricks, PA-C    Family History Family History  Problem Relation Age of Onset  . Hypertension Paternal Grandfather   . Sickle cell trait Father   . Cancer Mother        Died in 02/01/2009    Social History Social History   Tobacco Use  . Smoking status: Current Every Day Smoker    Packs/day: 0.50    Types: Cigarettes  . Smokeless tobacco: Never Used  Substance Use Topics  . Alcohol use: No    Alcohol/week: 3.6 oz    Types: 6 Shots of liquor per week    Comment: Denies 06/12/2014  . Drug use: Yes    Types: Marijuana, Cocaine     Allergies   Patient has no known allergies.   Review of Systems Review of Systems  Constitutional: Negative for chills and fever.  Respiratory: Negative for cough, chest tightness and shortness of breath.   Cardiovascular: Negative  for chest pain, palpitations and leg swelling.  Gastrointestinal: Negative for abdominal pain, diarrhea, nausea and vomiting.  Genitourinary: Negative for dysuria, flank pain, pelvic pain, vaginal bleeding, vaginal discharge and vaginal pain.  Musculoskeletal: Positive for arthralgias and myalgias. Negative for neck pain and neck stiffness.  Skin: Negative for rash.  Neurological: Negative for dizziness, weakness and headaches.  All other systems reviewed and are negative.    Physical Exam Updated Vital Signs BP 119/73 (BP Location: Left Arm)   Pulse 69   Temp 98.3 F (36.8 C) (Oral)   Resp 18   Ht 5\' 6"  (1.676 m)   Wt 54.9 kg (121 lb)   LMP 12/09/2017   SpO2 100%   BMI 19.53 kg/m   Physical Exam  Constitutional: She is  oriented to person, place, and time. She appears well-developed and well-nourished. No distress.  HENT:  Head: Normocephalic.  Eyes: Conjunctivae are normal.  Neck: Neck supple.  Cardiovascular: Normal rate, regular rhythm and normal heart sounds.  Pulmonary/Chest: Effort normal and breath sounds normal. No respiratory distress. She has no wheezes. She has no rales.  Abdominal: Soft. Bowel sounds are normal. She exhibits no distension. There is no tenderness. There is no rebound.  Musculoskeletal: She exhibits no edema.  No obvious swelling over bilateral upper or lower extremities.  Joints appear to be normal.  Full range of motion of all joints of upper and lower extremities.  DP and distal radial pulses intact and equal bilaterally  Neurological: She is alert and oriented to person, place, and time.  Skin: Skin is warm and dry.  Psychiatric: She has a normal mood and affect. Her behavior is normal.  Nursing note and vitals reviewed.    ED Treatments / Results  Labs (all labs ordered are listed, but only abnormal results are displayed) Labs Reviewed  COMPREHENSIVE METABOLIC PANEL - Abnormal; Notable for the following components:      Result Value   Calcium 8.8 (*)    ALT 13 (*)    Total Bilirubin 4.0 (*)    All other components within normal limits  CBC WITH DIFFERENTIAL/PLATELET - Abnormal; Notable for the following components:   WBC 15.6 (*)    RBC 2.57 (*)    Hemoglobin 9.0 (*)    HCT 25.2 (*)    MCH 35.0 (*)    RDW 17.8 (*)    Platelets 674 (*)    Neutro Abs 10.4 (*)    Monocytes Absolute 1.2 (*)    Basophils Absolute 0.2 (*)    All other components within normal limits  RETICULOCYTES - Abnormal; Notable for the following components:   Retic Ct Pct 13.8 (*)    RBC. 2.57 (*)    Retic Count, Absolute 354.7 (*)    All other components within normal limits  I-STAT BETA HCG BLOOD, ED ([facility], [facility], [facility] ONLY)    EKG  EKG Interpretation None       Radiology No results  found.  Procedures Procedures (including critical care time)  Medications Ordered in ED Medications  HYDROmorphone (DILAUDID) injection 0.5 mg (not administered)  0.45 % sodium chloride infusion (not administered)  ketorolac (TORADOL) 30 MG/ML injection 30 mg (not administered)  HYDROmorphone (DILAUDID) injection 0.5 mg (not administered)    Or  HYDROmorphone (DILAUDID) injection 0.5 mg (not administered)  HYDROmorphone (DILAUDID) injection 1 mg (not administered)    Or  HYDROmorphone (DILAUDID) injection 1 mg (not administered)  HYDROmorphone (DILAUDID) injection 1 mg (not administered)    Or  HYDROmorphone (DILAUDID) injection 1 mg (not administered)  HYDROmorphone (DILAUDID) injection 1 mg (not administered)    Or  HYDROmorphone (DILAUDID) injection 1 mg (not administered)     Initial Impression / Assessment and Plan / ED Course  I have reviewed the triage vital signs and the nursing notes.  Pertinent labs & imaging results that were available during my care of the patient were reviewed by me and considered in my medical decision making (see chart for details).     In emergency department with what she describes as her typical sickle cell crisis, she is having pain in her bilateral arms and legs.  Currently not on any medications for her sickle cell at home.  Will start IV fluids, labs, will order pain medications and reassess.  She is afebrile, nontoxic-appearing.  Normal vital signs.  10:29 PM Patient's labs are close to baseline.  She has received 2 doses of pain medicine, she appears to be groggy but states her pain has not improved.  She is asking for a second sandwich and more crackers.  We will give her third dose and reassess.  11:59 PM The patient received 3 doses of her medicine.  I went to reassess her, patient is crying.  She states her pain has not improved.  I spoke with admitting hospitalist, who will admit her for further pain treatment.  Her vital signs  remaining normal.  She is otherwise in no acute distress.  Vitals:   12/15/17 2115 12/15/17 2130 12/15/17 2205 12/15/17 2302  BP: (!) 107/55 (!) 102/51 96/61 105/61  Pulse: 79 79 68 65  Resp: (!) 26 17 17 13   Temp:      TempSrc:      SpO2: 96% 96% 96% 98%  Weight:      Height:         Final Clinical Impressions(s) / ED Diagnoses   Final diagnoses:  Sickle cell pain crisis [facility])    ED Discharge Orders    None       Jeannett Senior, PA-C 12/16/17 0000    Carmin Muskrat, MD 12/16/17 0008

## 2017-12-15 NOTE — ED Triage Notes (Signed)
Pt reports having pain all over related to sickle cell pain that started last night.

## 2017-12-15 NOTE — H&P (Signed)
History and Physical    KINDSEY EBLIN XVQ:[id number] DOB: Jul 11, 1996 DOA: 12/15/2017  Referring MD/NP/PA: Carolin Coy, PA-C PCP: Patient, No Pcp Per  Patient coming from: home  Chief Complaint: Bilateral leg pain  I have personally briefly reviewed patient's old medical records in [place]   HPI: [name] is a 22 y.o. female with medical history significant of sickle cell anemia and  polysubstance abuse; who presents with complaints of bilateral leg pain over the last 1-2 days.  She reports pain feels similar to previous sickle cell pain crises.  Associated symptoms include generalized malaise.  She is not on narcotic pain medication and reports nothing helped relieve symptoms.  Denies any recent falls/trauma, fever, chills, chest pain, shortness of breath, nausea, vomiting, abdominal pain, diarrhea, or dysuria symptoms.  She admits to using cocaine last couple days.  She does not have a primary care provider.  ED Course: Admission into the emergency department patient was noted to be afebrile, respirations 13-26, and all other vital signs relatively within normal limits.  Labs revealed WBC 15.6, hemoglobin 9, platelets 674.  Patient was given multiple rounds of Dilaudid while in the ED without resolution of pain.  TRH called to admit.  Review of Systems  Constitutional: Positive for malaise/fatigue. Negative for chills and fever.  HENT: Negative for congestion and ear discharge.   Eyes: Negative for photophobia and pain.  Respiratory: Negative for cough and shortness of breath.   Cardiovascular: Negative for chest pain and leg swelling.  Gastrointestinal: Negative for abdominal pain, diarrhea, nausea and vomiting.  Genitourinary: Negative for dysuria and frequency.  Musculoskeletal: Positive for myalgias. Negative for joint pain.  Skin: Negative for itching and rash.  Neurological: Negative for speech change and focal weakness.  Psychiatric/Behavioral:  Positive for substance abuse. Negative for hallucinations.    Past Medical History:  Diagnosis Date  . Acute kidney injury ([place]) 05/15/2016  . Amenorrhea   . Anemia    SICKLE CELL  . Asthma   . Drug-induced pruritus 02/26/2015  . GERD (gastroesophageal reflux disease)   . Headache   . Hyperbilirubinemia 02/26/2015  . Sickle cell disease ([place])     Past Surgical History:  Procedure Laterality Date  . SPLENECTOMY     Age 53 for sequestration  . TONSILLECTOMY     Age 35     reports that she has been smoking cigarettes.  She has been smoking about 0.50 packs per day. she has never used smokeless tobacco. She reports that she uses drugs. Drugs: Marijuana and Cocaine. She reports that she does not drink alcohol.  No Known Allergies  Family History  Problem Relation Age of Onset  . Hypertension Paternal Grandfather   . Sickle cell trait Father   . Cancer Mother        Died in Jan 26, 2009    Prior to Admission medications   Medication Sig Start Date End Date Taking? Authorizing Provider  albuterol (PROVENTIL HFA;VENTOLIN HFA) 108 (90 Base) MCG/ACT inhaler Inhale 2 puffs into the lungs every 6 (six) hours as needed for wheezing or shortness of breath.   Yes [provider]  ibuprofen (ADVIL,MOTRIN) 600 MG tablet Take 1 tablet (600 mg total) by mouth every 6 (six) hours as needed. Patient not taking: Reported on 11/20/2017 07/26/17   Domenic Moras, PA-C    Physical Exam:  Constitutional: Disheveled appearing female who appears to be in some distress and fidgeting in the bed Vitals:   12/15/17 2114-01-26 12/15/17 Jan 26, 2129 12/15/17  2205 12/15/17 2302  BP: (!) 107/55 (!) 102/51 96/61 105/61  Pulse: 79 79 68 65  Resp: (!) 26 17 17 13   Temp:      TempSrc:      SpO2: 96% 96% 96% 98%  Weight:      Height:       Eyes: PERRL, lids and conjunctivae normal ENMT: Mucous membranes are dry. Posterior pharynx clear of any exudate or lesions. Poor dentition.  Neck: normal, supple, no masses, no  thyromegaly Respiratory: clear to auscultation bilaterally, no wheezing, no crackles. Normal respiratory effort. No accessory muscle use.  Cardiovascular: Regular rate and rhythm, no murmurs / rubs / gallops. No extremity edema. 2+ pedal pulses. No carotid bruits.  Abdomen: no tenderness, no masses palpated. No hepatosplenomegaly. Bowel sounds positive.  Musculoskeletal: no clubbing / cyanosis. No joint deformity upper and lower extremities. Good ROM, no contractures. Normal muscle tone.   Skin: no rashes, lesions, ulcers. No induration Neurologic: CN 2-12 grossly intact. Sensation intact, DTR normal. Strength 5/5 in all 4.  Psychiatric: Poor judgment and insight. Alert and oriented x 3.  mood.     Labs on Admission: I have personally reviewed following labs and imaging studies  CBC: Recent Labs  Lab 12/15/17 2031  WBC 15.6*  NEUTROABS 10.4*  HGB 9.0*  HCT 25.2*  MCV 98.1  PLT 921*   Basic Metabolic Panel: Recent Labs  Lab 12/15/17 2031  NA 137  K 4.6  CL 105  CO2 25  GLUCOSE 99  BUN 14  CREATININE 0.81  CALCIUM 8.8*   GFR: Estimated Creatinine Clearance: 94.4 mL/min (by C-G formula based on SCr of 0.81 mg/dL). Liver Function Tests: Recent Labs  Lab 12/15/17 2031  AST 23  ALT 13*  ALKPHOS 67  BILITOT 4.0*  PROT 7.9  ALBUMIN 3.9   No results for input(s): LIPASE, AMYLASE in the last 168 hours. No results for input(s): AMMONIA in the last 168 hours. Coagulation Profile: No results for input(s): INR, PROTIME in the last 168 hours. Cardiac Enzymes: No results for input(s): CKTOTAL, CKMB, CKMBINDEX, TROPONINI in the last 168 hours. BNP (last 3 results) No results for input(s): PROBNP in the last 8760 hours. HbA1C: No results for input(s): HGBA1C in the last 72 hours. CBG: No results for input(s): GLUCAP in the last 168 hours. Lipid Profile: No results for input(s): CHOL, HDL, LDLCALC, TRIG, CHOLHDL, LDLDIRECT in the last 72 hours. Thyroid Function Tests: No  results for input(s): TSH, T4TOTAL, FREET4, T3FREE, THYROIDAB in the last 72 hours. Anemia Panel: Recent Labs    12/15/17 2031  RETICCTPCT 13.8*   Urine analysis:    Component Value Date/Time   COLORURINE YELLOW 04/19/2017 0004   APPEARANCEUR CLEAR 04/19/2017 0004   LABSPEC 1.013 04/19/2017 0004   PHURINE 6.0 04/19/2017 0004   GLUCOSEU NEGATIVE 04/19/2017 0004   HGBUR NEGATIVE 04/19/2017 0004   BILIRUBINUR NEGATIVE 04/19/2017 0004   KETONESUR NEGATIVE 04/19/2017 0004   PROTEINUR NEGATIVE 04/19/2017 0004   UROBILINOGEN 1.0 08/11/2015 1948   NITRITE NEGATIVE 04/19/2017 0004   LEUKOCYTESUR NEGATIVE 04/19/2017 0004   Sepsis Labs: No results found for this or any previous visit (from the past 240 hour(s)).   Radiological Exams on Admission: No results found.  EKG: Independently reviewed.  Sinus bradycardia  Assessment/Plan Sickle cell anemia with crisis: Patient is opiate nave and presents with bilateral leg pain.  Hemoglobin as 9 which appears near patient's baseline.  Patient given multiple doses of Dilaudid in the emergency department without  resolution of pain.  She does appear to be somewhat sedated.   - Admit to a telemetry bed - Sickle cell pain crisis order set initiated - IV fluids of D5 0.45% normal saline at 100 mL/h  - Reduced dose PCA with Dilaudid - Ketorolac 30 mg IV every 6 hours - Folic acid - Patient in need of primary care provider  Leukocytosis: acute on chronic.  WBC elevated at 15.6 on admission.  Review of records shows that patient's white blood cell count appears to be chronically elevated.  Patient reports no other complaints concerning for possible infection.  Could be likely related to recent substance abuse. - Recheck CBC in a.m.  H/O Polysubstance abuse: Patient admits to recent use in the last 3 or 4 days of cocaine. - Check urine drug screen - Social work consult for current substance abuse.  Thrombocytosis: Platelet count 674 on admission.   Suspect could be reactive in nature. - Recheck CBC in a.m.  DVT prophylaxis: lovenox Code Status: full  Family Communication: No family present at bedside Disposition Plan: To be determined Consults called: None Admission status: Observation  Norval Morton MD [facility] Pager [phone]   If 7PM-7AM, please contact night-coverage [email or web address] Password [facility]  12/15/2017, 11:52 PM

## 2017-12-16 DIAGNOSIS — K5900 Constipation, unspecified: Secondary | ICD-10-CM | POA: Diagnosis not present

## 2017-12-16 DIAGNOSIS — M79605 Pain in left leg: Secondary | ICD-10-CM | POA: Diagnosis present

## 2017-12-16 DIAGNOSIS — D638 Anemia in other chronic diseases classified elsewhere: Secondary | ICD-10-CM | POA: Diagnosis present

## 2017-12-16 DIAGNOSIS — D473 Essential (hemorrhagic) thrombocythemia: Secondary | ICD-10-CM | POA: Diagnosis present

## 2017-12-16 DIAGNOSIS — Z681 Body mass index (BMI) 19 or less, adult: Secondary | ICD-10-CM | POA: Diagnosis not present

## 2017-12-16 DIAGNOSIS — J45909 Unspecified asthma, uncomplicated: Secondary | ICD-10-CM | POA: Diagnosis present

## 2017-12-16 DIAGNOSIS — F1910 Other psychoactive substance abuse, uncomplicated: Secondary | ICD-10-CM | POA: Diagnosis present

## 2017-12-16 DIAGNOSIS — Z79899 Other long term (current) drug therapy: Secondary | ICD-10-CM | POA: Diagnosis not present

## 2017-12-16 DIAGNOSIS — Z832 Family history of diseases of the blood and blood-forming organs and certain disorders involving the immune mechanism: Secondary | ICD-10-CM | POA: Diagnosis not present

## 2017-12-16 DIAGNOSIS — M79604 Pain in right leg: Secondary | ICD-10-CM | POA: Diagnosis present

## 2017-12-16 DIAGNOSIS — E46 Unspecified protein-calorie malnutrition: Secondary | ICD-10-CM | POA: Diagnosis present

## 2017-12-16 DIAGNOSIS — Z8249 Family history of ischemic heart disease and other diseases of the circulatory system: Secondary | ICD-10-CM | POA: Diagnosis not present

## 2017-12-16 DIAGNOSIS — D649 Anemia, unspecified: Secondary | ICD-10-CM | POA: Diagnosis not present

## 2017-12-16 DIAGNOSIS — F17210 Nicotine dependence, cigarettes, uncomplicated: Secondary | ICD-10-CM | POA: Diagnosis present

## 2017-12-16 DIAGNOSIS — K219 Gastro-esophageal reflux disease without esophagitis: Secondary | ICD-10-CM | POA: Diagnosis present

## 2017-12-16 DIAGNOSIS — R1084 Generalized abdominal pain: Secondary | ICD-10-CM | POA: Diagnosis not present

## 2017-12-16 DIAGNOSIS — D5700 Hb-SS disease with crisis, unspecified: Secondary | ICD-10-CM | POA: Diagnosis present

## 2017-12-16 DIAGNOSIS — R1030 Lower abdominal pain, unspecified: Secondary | ICD-10-CM | POA: Diagnosis not present

## 2017-12-16 DIAGNOSIS — Z5321 Procedure and treatment not carried out due to patient leaving prior to being seen by health care provider: Secondary | ICD-10-CM | POA: Diagnosis present

## 2017-12-16 DIAGNOSIS — F1490 Cocaine use, unspecified, uncomplicated: Secondary | ICD-10-CM | POA: Diagnosis present

## 2017-12-16 DIAGNOSIS — Z590 Homelessness: Secondary | ICD-10-CM | POA: Diagnosis not present

## 2017-12-16 DIAGNOSIS — Z9081 Acquired absence of spleen: Secondary | ICD-10-CM | POA: Diagnosis not present

## 2017-12-16 DIAGNOSIS — R109 Unspecified abdominal pain: Secondary | ICD-10-CM | POA: Diagnosis present

## 2017-12-16 DIAGNOSIS — D72829 Elevated white blood cell count, unspecified: Secondary | ICD-10-CM | POA: Diagnosis present

## 2017-12-16 LAB — CBC WITH DIFFERENTIAL/PLATELET
BASOS ABS: 0.1 10*3/uL (ref 0.0–0.1)
Basophils Relative: 1 %
Eosinophils Absolute: 0.1 10*3/uL (ref 0.0–0.7)
Eosinophils Relative: 1 %
HEMATOCRIT: 22.5 % — AB (ref 36.0–46.0)
HEMOGLOBIN: 8.1 g/dL — AB (ref 12.0–15.0)
LYMPHS PCT: 48 %
Lymphs Abs: 5.7 10*3/uL — ABNORMAL HIGH (ref 0.7–4.0)
MCH: 35.2 pg — ABNORMAL HIGH (ref 26.0–34.0)
MCHC: 36 g/dL (ref 30.0–36.0)
MCV: 97.8 fL (ref 78.0–100.0)
MONOS PCT: 9 %
Monocytes Absolute: 1.1 10*3/uL — ABNORMAL HIGH (ref 0.1–1.0)
NEUTROS ABS: 4.9 10*3/uL (ref 1.7–7.7)
Neutrophils Relative %: 41 %
Platelets: 545 10*3/uL — ABNORMAL HIGH (ref 150–400)
RBC: 2.3 MIL/uL — AB (ref 3.87–5.11)
RDW: 17.4 % — ABNORMAL HIGH (ref 11.5–15.5)
WBC: 11.9 10*3/uL — ABNORMAL HIGH (ref 4.0–10.5)

## 2017-12-16 LAB — RAPID URINE DRUG SCREEN, HOSP PERFORMED
Amphetamines: NOT DETECTED
BENZODIAZEPINES: NOT DETECTED
Barbiturates: NOT DETECTED
COCAINE: POSITIVE — AB
OPIATES: POSITIVE — AB
Tetrahydrocannabinol: NOT DETECTED

## 2017-12-16 LAB — BASIC METABOLIC PANEL
ANION GAP: 7 (ref 5–15)
BUN: 14 mg/dL (ref 6–20)
CHLORIDE: 106 mmol/L (ref 101–111)
CO2: 25 mmol/L (ref 22–32)
Calcium: 8.6 mg/dL — ABNORMAL LOW (ref 8.9–10.3)
Creatinine, Ser: 0.88 mg/dL (ref 0.44–1.00)
GFR calc Af Amer: >60 mL/min (ref >60)
GFR calc non Af Amer: >60 mL/min (ref >60)
Glucose, Bld: 92 mg/dL (ref 65–99)
Potassium: 4.3 mmol/L (ref 3.5–5.1)
Sodium: 138 mmol/L (ref 135–145)

## 2017-12-16 LAB — TROPONIN I

## 2017-12-16 MED ORDER — FOLIC ACID 1 MG PO TABS
1.0000 mg | ORAL_TABLET | Freq: Every day | ORAL | Status: DC
  Administered 2017-12-16 – 2017-12-22 (×7): 1 mg via ORAL
  Filled 2017-12-16 (×7): qty 1

## 2017-12-16 MED ORDER — HYDROMORPHONE 1 MG/ML IV SOLN
INTRAVENOUS | Status: DC
  Administered 2017-12-16 (×2): 0.4 mg via INTRAVENOUS
  Administered 2017-12-16: 02:00:00 via INTRAVENOUS
  Administered 2017-12-16: 0.8 mg via INTRAVENOUS
  Administered 2017-12-16: 2 mg via INTRAVENOUS
  Administered 2017-12-16: 0.4 mg via INTRAVENOUS
  Administered 2017-12-17: 4 mg via INTRAVENOUS
  Administered 2017-12-17: 4.8 mg via INTRAVENOUS
  Administered 2017-12-17: 3.8 mg via INTRAVENOUS
  Administered 2017-12-17: 4.8 mg via INTRAVENOUS
  Administered 2017-12-17: 2.4 mg via INTRAVENOUS
  Administered 2017-12-17: 5 mg via INTRAVENOUS
  Administered 2017-12-18: 0.8 mg via INTRAVENOUS
  Administered 2017-12-18: 1.4 mg via INTRAVENOUS
  Administered 2017-12-18: 0.8 mg via INTRAVENOUS
  Administered 2017-12-18: 1.2 mg via INTRAVENOUS
  Filled 2017-12-16 (×2): qty 25

## 2017-12-16 MED ORDER — ALBUTEROL SULFATE (2.5 MG/3ML) 0.083% IN NEBU: 2.5000 mg | INHALATION_SOLUTION | Freq: Four times a day (QID) | RESPIRATORY_TRACT | Status: DC | PRN

## 2017-12-16 MED ORDER — DIPHENHYDRAMINE HCL 50 MG/ML IJ SOLN: 12.5000 mg | Freq: Four times a day (QID) | INTRAMUSCULAR | Status: DC | PRN

## 2017-12-16 MED ORDER — KETOROLAC TROMETHAMINE 30 MG/ML IJ SOLN
30.0000 mg | Freq: Four times a day (QID) | INTRAMUSCULAR | Status: AC
  Administered 2017-12-16 – 2017-12-20 (×20): 30 mg via INTRAVENOUS
  Filled 2017-12-16 (×20): qty 1

## 2017-12-16 MED ORDER — DEXTROSE-NACL 5-0.45 % IV SOLN
INTRAVENOUS | Status: DC
  Administered 2017-12-16 (×2): via INTRAVENOUS
  Administered 2017-12-17: 100 mL/h via INTRAVENOUS

## 2017-12-16 MED ORDER — ONDANSETRON HCL 4 MG/2ML IJ SOLN: 4.0000 mg | Freq: Four times a day (QID) | INTRAMUSCULAR | Status: DC | PRN

## 2017-12-16 MED ORDER — DIPHENHYDRAMINE HCL 12.5 MG/5ML PO ELIX: 12.5000 mg | ORAL_SOLUTION | Freq: Four times a day (QID) | ORAL | Status: DC | PRN

## 2017-12-16 MED ORDER — SENNOSIDES-DOCUSATE SODIUM 8.6-50 MG PO TABS
1.0000 | ORAL_TABLET | Freq: Two times a day (BID) | ORAL | Status: DC
  Administered 2017-12-16 – 2017-12-22 (×13): 1 via ORAL
  Filled 2017-12-16 (×14): qty 1

## 2017-12-16 MED ORDER — SODIUM CHLORIDE 0.9% FLUSH: 9.0000 mL | INTRAVENOUS | Status: DC | PRN

## 2017-12-16 MED ORDER — POLYETHYLENE GLYCOL 3350 17 G PO PACK
17.0000 g | PACK | Freq: Every day | ORAL | Status: DC | PRN
  Administered 2017-12-16: 17 g via ORAL
  Filled 2017-12-16: qty 1

## 2017-12-16 MED ORDER — ENOXAPARIN SODIUM 40 MG/0.4ML ~~LOC~~ SOLN
40.0000 mg | SUBCUTANEOUS | Status: DC
  Administered 2017-12-16 – 2017-12-19 (×3): 40 mg via SUBCUTANEOUS
  Filled 2017-12-16 (×6): qty 0.4

## 2017-12-16 MED ORDER — NALOXONE HCL 0.4 MG/ML IJ SOLN: 0.4000 mg | INTRAMUSCULAR | Status: DC | PRN

## 2017-12-16 NOTE — Progress Notes (Signed)
Subjective: [name] with history of sickle cell disease and cocaine abuse presenting with sickle cell painful crisis. Patient was admitted overnight and have since started on Dilaudid PCA. She is Opiate Nave. She is currently drowsy not able to give me adequate history. She remains afebrile with no significant complaint. Hemoglobin has dropped from 9 graft 8.1 g overnight with hydration. 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 She is otherwise stable with no complaints  Objective: Vital signs in last 24 hours: Temp:  [97.8 F (36.6 C)-98.3 F (36.8 C)] 98.1 F (36.7 C) (02/17 0455) Pulse Rate:  [50-87] 70 (02/17 0455) Resp:  [12-26] 18 (02/17 0455) BP: (96-119)/(50-73) 100/56 (02/17 0455) SpO2:  [95 %-100 %] 96 % (02/17 0455) Weight:  [51.4 kg (113 lb 5.1 oz)-54.9 kg (121 lb)] 51.4 kg (113 lb 5.1 oz) (02/17 0151) Weight change:  Last BM Date: (UTA d/t pt not communicating with staff)  Intake/Output from previous day: No intake/output data recorded. Intake/Output this shift: No intake/output data recorded.  General appearance: alert, cooperative, appears stated age and no distress Head: Normocephalic, without obvious abnormality, atraumatic Back: symmetric, no curvature. ROM normal. No CVA tenderness. Resp: clear to auscultation bilaterally Chest wall: no tenderness Cardio: regular rate and rhythm, S1, S2 normal, no murmur, click, rub or gallop GI: soft, non-tender; bowel sounds normal; no masses,  no organomegaly Extremities: extremities normal, atraumatic, no cyanosis or edema Pulses: 2+ and symmetric Neurologic: Grossly normal  Lab Results: Recent Labs    12/15/17 2031 12/16/17 0545  WBC 15.6* 11.9*  HGB 9.0* 8.1*  HCT 25.2* 22.5*  PLT 674* 545*   BMET Recent Labs    12/15/17 2031 12/16/17 0545  NA 137 138  K 4.6 4.3  CL 105 106  CO2 25 25  GLUCOSE 99 92  BUN 14 14  CREATININE 0.81 0.88  CALCIUM 8.8* 8.6*     Studies/Results: No results found.  Medications: I have reviewed the patient's current medications.  Assessment/Plan: [name] admitted with sickle cell painful crisis.  #1 sickle cell painful crisis: Patient will be maintain on the low dose Dilaudid PCA with Toradol. She appears reasonably stable at the moment. Oral medications will be available as needed.  #2 sickle cell anemia: Hemoglobin is still stable at 8.1. Reticulocyte is noted. Monitor patient closely.  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 Continue to monitor her platelets level  #4 polysubstance abuse: Patient is both or. I will cocaine positive again. This may be the trigger for her crisis. Substance abuse counseling will be given.   LOS: 0 days   Aalayah Riles,LAWAL 12/16/2017, 7:18 AM

## 2017-12-16 NOTE — Progress Notes (Signed)
Unable to complete admission screening due to patient not answering questions when asked. Patient falls asleep or does not answer admission questions when asked.

## 2017-12-16 NOTE — ED Notes (Signed)
ED TO INPATIENT HANDOFF REPORT  Name/Age/Gender [name] 22 y.o. female  Code Status    Code Status Orders  (From admission, onward)        Start     Ordered   12/15/17 2359  Full code  Continuous     12/16/17 0006    Code Status History    Date Active Date Inactive Code Status Order ID Comments User Context   04/19/2017 00:46 04/20/2017 17:13 Full Code [id number]  Toy Baker, MD Inpatient   02/26/2017 05:58 02/27/2017 15:55 Full Code [id number]  Rise Patience, MD Inpatient   02/12/2017 22:49 02/14/2017 19:47 Full Code [id number]  Karmen Bongo, MD Inpatient   12/22/2016 00:02 12/23/2016 14:49 Full Code [id number]  Reubin Milan, MD Inpatient   12/22/2016 00:02 12/22/2016 00:02 Full Code [id number]  Reubin Milan, MD Inpatient   10/20/2016 01:05 10/21/2016 15:52 Full Code [id number]  Toy Baker, MD ED   05/14/2016 21:23 05/18/2016 18:29 Full Code [id number]  Vivi Barrack, MD Inpatient   11/05/2015 04:53 11/08/2015 19:06 Full Code [id number]  Archie Patten, MD Inpatient   08/12/2015 02:07 08/15/2015 18:10 Full Code [id number]  Aquilla Hacker, MD Inpatient   03/10/2015 15:45 03/11/2015 20:51 Full Code [id number]  Olam Idler, MD Inpatient   03/09/2015 06:50 03/10/2015 12:06 Full Code [id number]  Aquilla Hacker, MD Inpatient   02/25/2015 07:12 02/26/2015 19:36 Full Code [id number]  Orvan Falconer, MD Inpatient   08/30/2014 08:47 09/02/2014 23:38 Full Code [id number]  Cordelia Poche, MD Inpatient   02/26/2014 02:24 03/02/2014 16:48 Full Code [id number]  Cordelia Poche, MD Inpatient   11/24/2013 01:15 11/24/2013 22:33 Full Code [id number]  Timmothy Euler, MD Inpatient   05/15/2013 03:24 05/21/2013 18:12 Full Code [id number]  Nolon Rod, DO Inpatient   05/15/2013 02:21 05/15/2013 03:24 Full Code [id number]  Nolon Rod, DO ED   03/08/2013 09:02 03/08/2013 18:38 Full Code [id number]  Coral Spikes, DO Inpatient   02/14/2013 06:03 02/15/2013 19:38 Full Code [id number]   Angelica Ran, MD ED   07/25/2012 00:43 07/28/2012 13:57 Full Code [id number]  Ayesha Rumpf, RN Inpatient   07/05/2012 09:01 07/05/2012 19:29 Full Code [id number]  Ma Hillock, DO Inpatient   09/20/2011 03:29 09/21/2011 10:41 Full Code [id number]  Altamese Dilling, RN Inpatient      Home/SNF/Other Home  Chief Complaint sickle cell crisis  Level of Care/Admitting Diagnosis ED Disposition    ED Disposition Condition [facility] Area: [place] [[id number]]  Level of Care: Telemetry [5]  Admit to tele based on following criteria: Other see comments  Comments: Opioid nave  Diagnosis: Sickle cell anemia with crisis [facility]) [[id number]]  Admitting Physician: Norval Morton [[id number]]  Attending Physician: Norval Morton [phone]  PT Class (Do Not Modify): Observation [104]  PT Acc Code (Do Not Modify): Observation [10022]       Medical History Past Medical History:  Diagnosis Date  . Acute kidney injury ([place]) 05/15/2016  . Amenorrhea   . Anemia    SICKLE CELL  . Asthma   . Drug-induced pruritus 02/26/2015  . GERD (gastroesophageal reflux disease)   . Headache   . Hyperbilirubinemia 02/26/2015  . Sickle cell disease ([place])     Allergies No Known Allergies  IV Location/Drains/Wounds Patient Lines/Drains/Airways Status   Active Line/Drains/Airways    Name:   Placement date:   Placement time:   Site:   Days:   Peripheral  IV 12/15/17 Left;Lateral Forearm   12/15/17    2036    Forearm   1          Labs/Imaging Results for orders placed or performed during the hospital encounter of 12/15/17 (from the past 48 hour(s))  Comprehensive metabolic panel     Status: Abnormal   Collection Time: 12/15/17  8:31 PM  Result Value Ref Range   Sodium 137 135 - 145 mmol/L   Potassium 4.6 3.5 - 5.1 mmol/L   Chloride 105 101 - 111 mmol/L   CO2 25 22 - 32 mmol/L   Glucose, Bld 99 65 - 99 mg/dL   BUN 14 6 - 20 mg/dL   Creatinine, Ser 0.81 0.44 - 1.00  mg/dL   Calcium 8.8 (L) 8.9 - 10.3 mg/dL   Total Protein 7.9 6.5 - 8.1 g/dL   Albumin 3.9 3.5 - 5.0 g/dL   AST 23 15 - 41 U/L   ALT 13 (L) 14 - 54 U/L   Alkaline Phosphatase 67 38 - 126 U/L   Total Bilirubin 4.0 (H) 0.3 - 1.2 mg/dL   GFR calc non Af Amer >60 >60 mL/min   GFR calc Af Amer >60 >60 mL/min    Comment: (NOTE) The eGFR has been calculated using the CKD EPI equation. This calculation has not been validated in all clinical situations. eGFR's persistently <60 mL/min signify possible Chronic Kidney Disease.    Anion gap 7 5 - 15    Comment: Performed at [facility], [place] [address] [place], [place] [address]  CBC with Differential     Status: Abnormal   Collection Time: 12/15/17  8:31 PM  Result Value Ref Range   WBC 15.6 (H) 4.0 - 10.5 K/uL   RBC 2.57 (L) 3.87 - 5.11 MIL/uL   Hemoglobin 9.0 (L) 12.0 - 15.0 g/dL   HCT 25.2 (L) 36.0 - 46.0 %   MCV 98.1 78.0 - 100.0 fL   MCH 35.0 (H) 26.0 - 34.0 pg   MCHC 35.7 30.0 - 36.0 g/dL   RDW 17.8 (H) 11.5 - 15.5 %   Platelets 674 (H) 150 - 400 K/uL   Neutrophils Relative % 67 %   Lymphocytes Relative 23 %   Monocytes Relative 8 %   Eosinophils Relative 1 %   Basophils Relative 1 %   Neutro Abs 10.4 (H) 1.7 - 7.7 K/uL   Lymphs Abs 3.6 0.7 - 4.0 K/uL   Monocytes Absolute 1.2 (H) 0.1 - 1.0 K/uL   Eosinophils Absolute 0.2 0.0 - 0.7 K/uL   Basophils Absolute 0.2 (H) 0.0 - 0.1 K/uL   RBC Morphology POLYCHROMASIA PRESENT     Comment: TARGET CELLS Sickle cells present RARE NRBCs Performed at [facility], [place] [address], [place], [place] [address]   Reticulocytes     Status: Abnormal   Collection Time: 12/15/17  8:31 PM  Result Value Ref Range   Retic Ct Pct 13.8 (H) 0.4 - 3.1 %   RBC. 2.57 (L) 3.87 - 5.11 MIL/uL   Retic Count, Absolute 354.7 (H) 19.0 - 186.0 K/uL    Comment: Performed at [facility], [place] [address], [place], [place] [address]  I-Stat beta hCG blood,  ED     Status: None   Collection Time: 12/15/17  8:46 PM  Result Value Ref Range   I-stat hCG, quantitative <5.0 <5 mIU/mL   Comment 3            Comment:  GEST. AGE      CONC.  (mIU/mL)   <=1 WEEK        5 - 50     2 WEEKS       50 - 500     3 WEEKS       100 - 10,000     4 WEEKS     1,000 - 30,000        FEMALE AND NON-PREGNANT FEMALE:     LESS THAN 5 mIU/mL    No results found.  Pending Labs Unresulted Labs (From admission, onward)   Start     Ordered   12/16/17 0114  Troponin I  Add-on,   R     12/16/17 0113   12/16/17 0002  Urine rapid drug screen (hosp performed)  STAT,   R     12/16/17 0006      Vitals/Pain Today's Vitals   12/15/17 2300 12/15/17 2300 12/15/17 2302 12/15/17 2315  BP: 105/61  105/61 110/60  Pulse: (!) 50  65 64  Resp: '20  13 17  ' Temp:      TempSrc:      SpO2: 100%  98% 96%  Weight:      Height:      PainSc:  6       Isolation Precautions No active isolations  Medications Medications  HYDROmorphone (DILAUDID) injection 0.5 mg (0.5 mg Subcutaneous Not Given 12/15/17 2039)  HYDROmorphone (DILAUDID) injection 1 mg (not administered)    Or  HYDROmorphone (DILAUDID) injection 1 mg (not administered)  albuterol (PROVENTIL) (2.5 MG/3ML) 0.083% nebulizer solution 2.5 mg (not administered)  senna-docusate (Senokot-S) tablet 1 tablet (not administered)  polyethylene glycol (MIRALAX / GLYCOLAX) packet 17 g (not administered)  enoxaparin (LOVENOX) injection 40 mg (not administered)  ketorolac (TORADOL) 30 MG/ML injection 30 mg (not administered)  naloxone (NARCAN) injection 0.4 mg (not administered)    And  sodium chloride flush (NS) 0.9 % injection 9 mL (not administered)  ondansetron (ZOFRAN) injection 4 mg (not administered)  diphenhydrAMINE (BENADRYL) injection 12.5 mg (not administered)    Or  diphenhydrAMINE (BENADRYL) 12.5 MG/5ML elixir 12.5 mg (not administered)  HYDROmorphone (DILAUDID) 1 mg/mL PCA injection (not administered)  folic  acid (FOLVITE) tablet 1 mg (not administered)  dextrose 5 %-0.45 % sodium chloride infusion (not administered)  ketorolac (TORADOL) 30 MG/ML injection 30 mg (30 mg Intravenous Given 12/15/17 2039)  HYDROmorphone (DILAUDID) injection 0.5 mg (0.5 mg Intravenous Given 12/15/17 2039)    Or  HYDROmorphone (DILAUDID) injection 0.5 mg ( Subcutaneous See Alternative 12/15/17 2039)  HYDROmorphone (DILAUDID) injection 1 mg (1 mg Intravenous Given 12/15/17 2132)    Or  HYDROmorphone (DILAUDID) injection 1 mg ( Subcutaneous See Alternative 12/15/17 2132)  HYDROmorphone (DILAUDID) injection 1 mg (1 mg Intravenous Given 12/15/17 2225)    Or  HYDROmorphone (DILAUDID) injection 1 mg ( Subcutaneous See Alternative 12/15/17 2225)  diphenhydrAMINE (BENADRYL) capsule 25 mg (25 mg Oral Given 12/16/17 0010)    Mobility walks

## 2017-12-17 DIAGNOSIS — E46 Unspecified protein-calorie malnutrition: Secondary | ICD-10-CM

## 2017-12-17 DIAGNOSIS — Z590 Homelessness: Secondary | ICD-10-CM

## 2017-12-17 DIAGNOSIS — F1910 Other psychoactive substance abuse, uncomplicated: Secondary | ICD-10-CM

## 2017-12-17 DIAGNOSIS — R1030 Lower abdominal pain, unspecified: Secondary | ICD-10-CM

## 2017-12-17 DIAGNOSIS — D72829 Elevated white blood cell count, unspecified: Secondary | ICD-10-CM

## 2017-12-17 LAB — URINALYSIS, ROUTINE W REFLEX MICROSCOPIC
BILIRUBIN URINE: NEGATIVE
Glucose, UA: NEGATIVE mg/dL
HGB URINE DIPSTICK: NEGATIVE
Ketones, ur: NEGATIVE mg/dL
Leukocytes, UA: NEGATIVE
Nitrite: NEGATIVE
PH: 6 (ref 5.0–8.0)
Protein, ur: NEGATIVE mg/dL
SPECIFIC GRAVITY, URINE: 1.009 (ref 1.005–1.030)

## 2017-12-17 MED ORDER — ENSURE ENLIVE PO LIQD
237.0000 mL | Freq: Two times a day (BID) | ORAL | Status: DC
  Administered 2017-12-18 – 2017-12-21 (×5): 237 mL via ORAL

## 2017-12-17 MED ORDER — OXYCODONE HCL 5 MG PO TABS
10.0000 mg | ORAL_TABLET | ORAL | Status: DC
  Administered 2017-12-17 – 2017-12-22 (×31): 10 mg via ORAL
  Filled 2017-12-17 (×31): qty 2

## 2017-12-17 NOTE — Progress Notes (Signed)
SICKLE CELL SERVICE PROGRESS NOTE  [name] [facility] WCB:[id number] DOB: 06/16/1996 DOA: 12/15/2017 PCP: Patient, No Pcp Per  Assessment/Plan: Principal Problem:   Sickle cell anemia with crisis ([place]) Active Problems:   Leukocytosis   Substance abuse ([place])  1. Hb SS with Crisis: Will schedule Oxycodone 10 mg (pt opiate tolerant) every 4 hours. Continue Toradol and decrease IVF to [facility].  2. Supra-pubic Pain: Check Urinalysis to evaluate for UTI.  3. Protein Calorie Malnutrition:Will add Boost or Ensure 4. Homelessness: Pt not interested in SW.   Code Status: Full Code Family Communication: N/A Disposition Plan: Not yet ready for discharge  [place].  Pager [phone]. If 7PM-7AM, please contact night-coverage.  12/17/2017, 3:19 PM  LOS: 1 day   Interim History: Pt states that she has pain in the legs at an intensity of 6/10. Improved from 10/10 on admission.Pt also c/o supra-pubic pain and tenderness. Pt reports that she uses cocaine about 2-3 x week and last used 1 day prior to admission. She is homeless but shared that she has actually been in contact with her aunt which is an improvement since her last admission. She lost her mother to sickle cell disease when she was a teenager and has been livingn in the streets since then. She appearance is much less unkempt than usual and she is more engaged in conversation than usual.   Consultants:  None  Procedures:  none  Antibiotics:  None    Objective: Vitals:   12/17/17 0627 12/17/17 1113 12/17/17 1505 12/17/17 1518  BP:    (!) 109/51  Pulse:    93  Resp: 20 16 16 17   Temp:    98.4 F (36.9 C)  TempSrc:    Oral  SpO2: 95% 95% 96% 100%  Weight:      Height:       Weight change:   Intake/Output Summary (Last 24 hours) at 12/17/2017 1519 Last data filed at 12/17/2017 1500 Gross per 24 hour  Intake 4200 ml  Output 1801 ml  Net 2399 ml     Physical Exam General: Alert, awake, oriented x3, in no acute  distress.  HEENT: [place]/AT PEERL, EOMI, anicteric Neck: Trachea midline,  no masses, no thyromegal,y no JVD, no carotid bruit OROPHARYNX:  Moist, No exudate/ erythema/lesions.  Heart: Regular rate and rhythm, II/VI systolic ejection murmur at base. No rubs or gallops, PMI non-displaced, no heaves or thrills on palpation.  Lungs: Clear to auscultation, no wheezing or rhonchi noted. No increased vocal fremitus resonant to percussion  Abdomen: Soft, nontender, nondistended, positive bowel sounds, no masses no hepatosplenomegaly noted.  Neuro: No focal neurological deficits noted cranial nerves II through XII grossly intact. Strength at baseline in bilateral upper and lower extremities. Musculoskeletal: No warmth swelling or erythema around joints, no spinal tenderness noted. Psychiatric: Patient alert and oriented x3, good insight and cognition, good recent to remote recall.     Data Reviewed: Basic Metabolic Panel: Recent Labs  Lab 12/15/17 2031 12/16/17 0545  NA 137 138  K 4.6 4.3  CL 105 106  CO2 25 25  GLUCOSE 99 92  BUN 14 14  CREATININE 0.81 0.88  CALCIUM 8.8* 8.6*   Liver Function Tests: Recent Labs  Lab 12/15/17 2031  AST 23  ALT 13*  ALKPHOS 67  BILITOT 4.0*  PROT 7.9  ALBUMIN 3.9   No results for input(s): LIPASE, AMYLASE in the last 168 hours. No results for input(s): AMMONIA in the last 168 hours. CBC: Recent Labs  Lab 12/15/17 2031 12/16/17 0545  WBC 15.6* 11.9*  NEUTROABS 10.4* 4.9  HGB 9.0* 8.1*  HCT 25.2* 22.5*  MCV 98.1 97.8  PLT 674* 545*   Cardiac Enzymes: Recent Labs  Lab 12/16/17 0208  TROPONINI <0.03   BNP (last 3 results) No results for input(s): BNP in the last 8760 hours.  ProBNP (last 3 results) No results for input(s): PROBNP in the last 8760 hours.  CBG: No results for input(s): GLUCAP in the last 168 hours.  No results found for this or any previous visit (from the past 240 hour(s)).   Studies: No results  found.  Scheduled Meds: . enoxaparin (LOVENOX) injection  40 mg Subcutaneous Q24H  . folic acid  1 mg Oral Daily  . HYDROmorphone   Intravenous Q4H  .  HYDROmorphone (DILAUDID) injection  0.5 mg Subcutaneous Once  . ketorolac  30 mg Intravenous Q6H  . oxyCODONE  10 mg Oral Q4H  . senna-docusate  1 tablet Oral BID   Continuous Infusions: . dextrose 5 % and 0.45% NaCl 100 mL/hr (12/17/17 0416)    Principal Problem:   Sickle cell anemia with crisis ([place]) Active Problems:   Leukocytosis   Substance abuse ([facility])   In excess of 35 minutes spent during this visit. Greater than 50% involved face to face contact with the patient for assessment, counseling and coordination of care.

## 2017-12-18 ENCOUNTER — Inpatient Hospital Stay (HOSPITAL_COMMUNITY): Payer: Medicare Other

## 2017-12-18 MED ORDER — ONDANSETRON HCL 4 MG/2ML IJ SOLN: 4.0000 mg | Freq: Four times a day (QID) | INTRAMUSCULAR | Status: DC | PRN

## 2017-12-18 MED ORDER — SODIUM CHLORIDE 0.9% FLUSH: 9.0000 mL | INTRAVENOUS | Status: DC | PRN

## 2017-12-18 MED ORDER — HYDROMORPHONE 1 MG/ML IV SOLN
INTRAVENOUS | Status: DC
  Administered 2017-12-18: 2.8 mg via INTRAVENOUS
  Administered 2017-12-18: 3.2 mg via INTRAVENOUS
  Administered 2017-12-19 (×2): 2.8 mg via INTRAVENOUS
  Administered 2017-12-19: 1.2 mg via INTRAVENOUS
  Administered 2017-12-19: 4.8 mg via INTRAVENOUS
  Filled 2017-12-18: qty 25

## 2017-12-18 MED ORDER — LACTULOSE 10 GM/15ML PO SOLN
20.0000 g | Freq: Once | ORAL | Status: AC
  Administered 2017-12-18: 20 g via ORAL
  Filled 2017-12-18: qty 30

## 2017-12-18 MED ORDER — SODIUM CHLORIDE 0.9 % IV SOLN: 25.0000 mg | INTRAVENOUS | Status: DC | PRN

## 2017-12-18 MED ORDER — DIPHENHYDRAMINE HCL 25 MG PO CAPS
25.0000 mg | ORAL_CAPSULE | ORAL | Status: DC | PRN
Start: 2017-12-18 — End: 2017-12-22
  Administered 2017-12-19: 25 mg via ORAL
  Filled 2017-12-18: qty 1

## 2017-12-18 MED ORDER — NALOXONE HCL 0.4 MG/ML IJ SOLN: 0.4000 mg | INTRAMUSCULAR | Status: DC | PRN

## 2017-12-18 NOTE — Progress Notes (Addendum)
SICKLE CELL SERVICE PROGRESS NOTE  [name] [facility] JKD:[id number] DOB: 10/31/1995 DOA: 12/15/2017 PCP: [name]  Assessment/Plan: Principal Problem:   Sickle cell anemia with crisis ([place]) Active Problems:   Leukocytosis   Substance abuse ([place])  1. Hb SS with Crisis: Continue scheduled Oxycodone 10 mg (pt opiate tolerant) every 4 hours and continue Toradol. Will i ncrease dose of PCA to a bolus dose of 0.4 mg.  2. Supra-pubic Pain: Urinalysis negative. Will check KUB. 3. Protein Calorie Malnutrition:Will add Boost or Ensure 4. Homelessness: Pt not interested in SW.   Code Status: Full Code Family Communication: N/A Disposition Plan: Not yet ready for discharge  [place].  Pager [phone]. If 7PM-7AM, please contact night-coverage.  12/18/2017, 4:09 PM  LOS: 2 days   Interim History: Pt states that she has pain in the legs at an intensity of 5-6/1 but the pain in the suprapubic region is increased since yesterday. Urinalysis negative for any elements pointing to UTI.  Pt reports that she uses cocaine about 2-3 x week and last used 1 day prior to admission. She is homeless but shared that she has actually been in contact with her aunt which is an improvement since her last admission. She lost her mother to sickle cell disease when she was a teenager and has been living in the streets since then. She states that she will be staying with her father after discharge. Her appearance is much less unkempt than usual and she is more engaged in conversation than usual.   Consultants:  None  Procedures:  none  Antibiotics:  None    Objective: Vitals:   12/18/17 0659 12/18/17 0806 12/18/17 1030 12/18/17 1154  BP: (!) 101/39  (!) 97/47   Pulse: 71  82   Resp:  19 (!) 21 13  Temp:   98.5 F (36.9 C)   TempSrc:   Oral   SpO2:  99% 100% 100%  Weight:      Height:       Weight change:   Intake/Output Summary (Last 24 hours) at 12/18/2017 1609 Last data  filed at 12/18/2017 1505 Gross Bradley 24 hour  Intake 835 ml  Output -  Net 835 ml     Physical Exam General: Alert, awake, oriented x3, in no acute distress.  HEENT: [place]/AT PEERL, EOMI, anicteric Neck: Trachea midline,  no masses, no thyromegal,y no JVD, no carotid bruit OROPHARYNX:  Moist, No exudate/ erythema/lesions.  Heart: Regular rate and rhythm, II/VI systolic ejection murmur at base. No rubs or gallops, PMI non-displaced, no heaves or thrills on palpation.  Lungs: Clear to auscultation, no wheezing or rhonchi noted. No increased vocal fremitus resonant to percussion  Abdomen: Soft,  (+) supra-pubic tenderness, nondistended, positive bowel sounds, no masses no hepatosplenomegaly noted.  Neuro: No focal neurological deficits noted cranial nerves II through XII grossly intact. Strength at baseline in bilateral upper and lower extremities. Musculoskeletal: No warmth swelling or erythema around joints, no spinal tenderness noted. Psychiatric: Patient alert and oriented x3, good insight and cognition, good recent to remote recall.     Data Reviewed: Basic Metabolic Panel: Recent Labs  Lab 12/15/17 2031 12/16/17 0545  NA 137 138  K 4.6 4.3  CL 105 106  CO2 25 25  GLUCOSE 99 92  BUN 14 14  CREATININE 0.81 0.88  CALCIUM 8.8* 8.6*   Liver Function Tests: Recent Labs  Lab 12/15/17 2031  AST 23  ALT 13*  ALKPHOS 67  BILITOT 4.0*  PROT 7.9  ALBUMIN 3.9   No results for input(s): LIPASE, AMYLASE in the last 168 hours. No results for input(s): AMMONIA in the last 168 hours. CBC: Recent Labs  Lab 12/15/17 2031 12/16/17 0545  WBC 15.6* 11.9*  NEUTROABS 10.4* 4.9  HGB 9.0* 8.1*  HCT 25.2* 22.5*  MCV 98.1 97.8  PLT 674* 545*   Cardiac Enzymes: Recent Labs  Lab 12/16/17 0208  TROPONINI <0.03   BNP (last 3 results) No results for input(s): BNP in the last 8760 hours.  ProBNP (last 3 results) No results for input(s): PROBNP in the last 8760 hours.  CBG: No  results for input(s): GLUCAP in the last 168 hours.  No results found for this or any previous visit (from the past 240 hour(s)).   Studies: No results found.  Scheduled Meds: . enoxaparin (LOVENOX) injection  40 mg Subcutaneous Q24H  . feeding supplement (ENSURE ENLIVE)  237 mL Oral BID BM  . folic acid  1 mg Oral Daily  . HYDROmorphone   Intravenous Q4H  .  HYDROmorphone (DILAUDID) injection  0.5 mg Subcutaneous Once  . ketorolac  30 mg Intravenous Q6H  . oxyCODONE  10 mg Oral Q4H  . senna-docusate  1 tablet Oral BID   Continuous Infusions: . dextrose 5 % and 0.45% NaCl 10 mL/hr at 12/17/17 1535    Principal Problem:   Sickle cell anemia with crisis [facility]) Active Problems:   Leukocytosis   Substance abuse (HCC)   In excess of 25 minutes spent during this visit. Greater than 50% involved face to face contact with the patient for assessment, counseling and coordination of care.

## 2017-12-19 DIAGNOSIS — D649 Anemia, unspecified: Secondary | ICD-10-CM

## 2017-12-19 LAB — BASIC METABOLIC PANEL
ANION GAP: 7 (ref 5–15)
BUN: 18 mg/dL (ref 6–20)
CHLORIDE: 104 mmol/L (ref 101–111)
CO2: 25 mmol/L (ref 22–32)
Calcium: 8.5 mg/dL — ABNORMAL LOW (ref 8.9–10.3)
Creatinine, Ser: 0.8 mg/dL (ref 0.44–1.00)
GFR calc non Af Amer: >60 mL/min (ref >60)
Glucose, Bld: 88 mg/dL (ref 65–99)
POTASSIUM: 4.5 mmol/L (ref 3.5–5.1)
SODIUM: 136 mmol/L (ref 135–145)

## 2017-12-19 LAB — CBC WITH DIFFERENTIAL/PLATELET
BASOS PCT: 0 %
Band Neutrophils: 1 %
Basophils Absolute: 0 10*3/uL (ref 0.0–0.1)
EOS ABS: 0.3 10*3/uL (ref 0.0–0.7)
EOS PCT: 1 %
HCT: 17.2 % — ABNORMAL LOW (ref 36.0–46.0)
HEMOGLOBIN: 6.4 g/dL — AB (ref 12.0–15.0)
Lymphocytes Relative: 22 %
Lymphs Abs: 6 10*3/uL — ABNORMAL HIGH (ref 0.7–4.0)
MCH: 36 pg — ABNORMAL HIGH (ref 26.0–34.0)
MCHC: 37.2 g/dL — ABNORMAL HIGH (ref 30.0–36.0)
MCV: 96.6 fL (ref 78.0–100.0)
Metamyelocytes Relative: 1 %
Monocytes Absolute: 0.3 10*3/uL (ref 0.1–1.0)
Monocytes Relative: 1 %
NEUTROS ABS: 20.7 10*3/uL — AB (ref 1.7–7.7)
NEUTROS PCT: 74 %
Platelets: 529 10*3/uL — ABNORMAL HIGH (ref 150–400)
RBC: 1.78 MIL/uL — ABNORMAL LOW (ref 3.87–5.11)
RDW: 17.8 % — ABNORMAL HIGH (ref 11.5–15.5)
WBC: 27.3 10*3/uL — ABNORMAL HIGH (ref 4.0–10.5)

## 2017-12-19 LAB — GLUCOSE, CAPILLARY: GLUCOSE-CAPILLARY: 110 mg/dL — AB (ref 65–99)

## 2017-12-19 MED ORDER — HYDROMORPHONE 1 MG/ML IV SOLN
INTRAVENOUS | Status: DC
Start: 2017-12-19 — End: 2017-12-22
  Administered 2017-12-19: 2.4 mg via INTRAVENOUS
  Administered 2017-12-19: 1.5 mg via INTRAVENOUS
  Administered 2017-12-20: 4 mg via INTRAVENOUS
  Administered 2017-12-20: 6 mg via INTRAVENOUS
  Administered 2017-12-20: 3.5 mg via INTRAVENOUS
  Administered 2017-12-20 (×2): 3 mg via INTRAVENOUS
  Administered 2017-12-21: 0.5 mg via INTRAVENOUS
  Administered 2017-12-21: 2.5 mg via INTRAVENOUS
  Administered 2017-12-21: 15:00:00 via INTRAVENOUS
  Administered 2017-12-21: 3.5 mg via INTRAVENOUS
  Administered 2017-12-21: 3 mg via INTRAVENOUS
  Administered 2017-12-21: 4.5 mg via INTRAVENOUS
  Administered 2017-12-21: 3.5 mg via INTRAVENOUS
  Administered 2017-12-22 (×3): 2.5 mg via INTRAVENOUS
  Administered 2017-12-22: 6 mg via INTRAVENOUS
  Administered 2017-12-22: 2.5 mg via INTRAVENOUS
  Filled 2017-12-19 (×2): qty 25

## 2017-12-19 MED ORDER — MAGNESIUM CITRATE PO SOLN
1.0000 | Freq: Once | ORAL | Status: AC
  Administered 2017-12-19: 1 via ORAL
  Filled 2017-12-19: qty 296

## 2017-12-19 NOTE — Care Management Important Message (Signed)
Important Message  Patient Details  Name: [name] MRN: [id number] Date of Birth: 1996/01/05   Medicare Important Message Given:  Yes    Kerin Salen 12/19/2017, 12:53 [place] Message  Patient Details  Name: [name] MRN: [id number] Date of Birth: 1996-09-13   Medicare Important Message Given:  Yes    Kerin Salen 12/19/2017, 12:53 PM

## 2017-12-19 NOTE — Progress Notes (Signed)
CRITICAL VALUE ALERT  Critical Value:  Hgb 6.4  Date & Time Notied:  12/19/17 1314 via Amion  Provider Notified: Dr Zigmund Daniel  Orders Received/Actions taken: pending

## 2017-12-19 NOTE — Clinical Social Work Note (Signed)
Clinical Social Work Assessment  Patient Details  Name: [name] MRN: [id number] Date of Birth: 1996/07/06  Date of referral:  12/19/17               Reason for consult:  Substance Use/ETOH Abuse, Housing Concerns/Homelessness                Permission sought to share information with:    Permission granted to share information::     Name::        Agency::     Relationship::     Contact Information:     Housing/Transportation Living arrangements for the past 2 months:  No permanent address(Patient reported that she is staying with someone while she looks for housing) Source of Information:  Patient Patient Interpreter Needed:  None Criminal Activity/Legal Involvement Pertinent to Current Situation/Hospitalization:  No - Comment as needed Significant Relationships:  Parents Lives with:  Other (Comment)(unknown) Do you feel safe going back to the place where you live?  Yes Need for family participation in patient care:  No (Coment)  Care giving concerns:  CSW consulted for polysubstance abuse and homelessness. Patient reported that she is currently staying with someone while she looks for housing. Patient reports that she receives disability. CSW inquired about how much patient receives in income, patient replied "enough". Patient reported that she is actively seeking housing and is not currently homeless. Patient reported no care giving concerns.    Social Worker assessment / plan:  CSW spoke with patient at bedside regarding SA and housing consult. Patient reported that she is actively seeking housing. CSW provided patient with housing resources. CSW inquired about patient's substance use, patient reported that she knows how to maintain her drug use. Patient reported that she started using drugs within the last year and that she is working on stopping on her own. CSW and patient discussed what techniques patient was using to stop using drugs. Patient reported that she is  avoiding people and places that have/use drugs. CSW inquired about patient's interest in substance abuse treatment. Patient reported that she is not interested in rehab because it is a downfall. CSW explained purpose of rehab and substance abuse treatment, patient reported that she wanted to do it on her own. CSW encouraged patient to continue avoiding people and places with drugs. CSW provided patient with substance use treatment resources incase she is interested in the future.  CSW signing off, CSW provided patient with housing and substance abuse treatment resources.   Employment status:  Disabled (Comment on whether or not currently receiving Disability) Insurance information:  Medicare, Medicaid In [place] PT Recommendations:  Not assessed at this time Information / Referral to community resources:  Shelter, Outpatient Substance Abuse Treatment Options, Other (Comment Required)(Housing Resources)  Patient/Family's Response to care:  Patient appreciative of CSW intervention and housing resources provided.   Patient/Family's Understanding of and Emotional Response to Diagnosis, Current Treatment, and Prognosis:  Patient presented guarded and responded with short answers. Patient verbalized little insight to how her drug use was affecting her health. Patient reported that she is working on her drug use. CSW encouraged patient to continue working on stopping her drug use and informed her that treatment is available if she is interested.   Emotional Assessment Appearance:  Disheveled Attitude/Demeanor/Rapport:  Guarded Affect (typically observed):  Defensive Orientation:  Oriented to Self, Oriented to Place, Oriented to  Time, Oriented to Situation Alcohol / Substance use:  Illicit Drugs Psych involvement (Current and /or in  the community):  No (Comment)  Discharge Needs  Concerns to be addressed:  Substance Abuse Concerns Readmission within the last 30 days:  No Current discharge risk:   Substance Abuse Barriers to Discharge:  Continued Medical Work up   [employer], LCSW 12/19/2017, 11:01 AM

## 2017-12-19 NOTE — Progress Notes (Signed)
SICKLE CELL SERVICE PROGRESS NOTE  [name] [facility] JGG:[id number] DOB: Aug 03, 1996 DOA: 12/15/2017 PCP: Patient, No Pcp Per  Assessment/Plan: Principal Problem:   Sickle cell anemia with crisis ([place]) Active Problems:   Leukocytosis   Substance abuse ([place])  1. Hb SS with Crisis: Continue scheduled Oxycodone 10 mg (pt opiate tolerant) every 4 hours and continue Toradol. Will i ncrease dose of PCA to a bolus dose of 0.5 mg.  2. Leukocytosis: Patient has been escalating leukocytosis without any focus of infection.  She has no fevers or any other signs of infection or inflammation.  We will continue to monitor white blood cell count. 3. Anemia of chronic disease: Patient's baseline hemoglobin is unsure.  However has had a decrease in her hemoglobin admission which could be attributable to dilutional effect of IV fluids.  However it is concerning that she also has an escalating white blood cell count.  She is however asymptomatic from the anemia.  I will continue to follow 4. Supra-pubic Pain: KUB shows large portion of stool in the intestines 5. Protein Calorie Malnutrition:Will add Boost or Ensure 6. Homelessness: Pt not interested in SW.   Code Status: Full Code Family Communication: N/A Disposition Plan: Not yet ready for discharge  [place].  Pager [phone]. If 7PM-7AM, please contact night-coverage.  12/19/2017, 5:06 PM  LOS: 3 days   Interim History: Pt continues to complain of abdominal pain.  She has been tolerating diet without any difficulty but has not had a bowel movement since admission.  [place] nonsurgical on examination and has tenderness in the suprapubic area.  She is used 17.6 mg of Dilaudid with 62/44: Demand/deliveries in the last 24 hours.  Patient denies any vaginal discharge.  Consultants:  None  Procedures:  none  Antibiotics:  None    Objective: Vitals:   12/19/17 0739 12/19/17 1156 12/19/17 1300 12/19/17 1636  BP:  120/60 (!) 116/58    Pulse:  90 90   Resp: 10 19 16 13   Temp:  98.5 F (36.9 C) 98.8 F (37.1 C)   TempSrc:  Oral Oral   SpO2: 97% 99% 100% 96%  Weight:      Height:       Weight change:   Intake/Output Summary (Last 24 hours) at 12/19/2017 1706 Last data filed at 12/19/2017 1300 Gross per 24 hour  Intake 960 ml  Output 2400 ml  Net -1440 ml     Physical Exam General: Alert, awake, oriented x3, in mild distress due to pain.  HEENT: [place]/AT PEERL, EOMI, anicteric Neck: Trachea midline,  no masses, no thyromegal,y no JVD, no carotid bruit OROPHARYNX:  Moist, No exudate/ erythema/lesions.  Heart: Regular rate and rhythm, II/VI systolic ejection murmur at base. No rubs or gallops, PMI non-displaced, no heaves or thrills on palpation.  Lungs: Clear to auscultation, no wheezing or rhonchi noted. No increased vocal fremitus resonant to percussion  Abdomen: Soft,  (+) supra-pubic tenderness, nondistended, positive bowel sounds, no masses no hepatosplenomegaly noted.  Neuro: No focal neurological deficits noted cranial nerves II through XII grossly intact. Strength at baseline in bilateral upper and lower extremities. Musculoskeletal: No warmth swelling or erythema around joints, no spinal tenderness noted. Psychiatric: Patient alert and oriented x3, good insight and cognition, good recent to remote recall.     Data Reviewed: Basic Metabolic Panel: Recent Labs  Lab 12/15/17 2031 12/16/17 0545 12/19/17 1143  NA 137 138 136  K 4.6 4.3 4.5  CL 105 106 104  CO2 25 25  25  GLUCOSE 99 92 88  BUN 14 14 18   CREATININE 0.81 0.88 0.80  CALCIUM 8.8* 8.6* 8.5*   Liver Function Tests: Recent Labs  Lab 12/15/17 2031  AST 23  ALT 13*  ALKPHOS 67  BILITOT 4.0*  PROT 7.9  ALBUMIN 3.9   No results for input(s): LIPASE, AMYLASE in the last 168 hours. No results for input(s): AMMONIA in the last 168 hours. CBC: Recent Labs  Lab 12/15/17 2031 12/16/17 0545 12/19/17 1143  WBC 15.6* 11.9* 27.3*   NEUTROABS 10.4* 4.9 20.7*  HGB 9.0* 8.1* 6.4*  HCT 25.2* 22.5* 17.2*  MCV 98.1 97.8 96.6  PLT 674* 545* 529*   Cardiac Enzymes: Recent Labs  Lab 12/16/17 0208  TROPONINI <0.03   BNP (last 3 results) No results for input(s): BNP in the last 8760 hours.  ProBNP (last 3 results) No results for input(s): PROBNP in the last 8760 hours.  CBG: No results for input(s): GLUCAP in the last 168 hours.  No results found for this or any previous visit (from the past 240 hour(s)).   Studies: Dg Abd 1 View  Result Date: 12/18/2017 CLINICAL DATA:  Lower abdominal pain. EXAM: ABDOMEN - 1 VIEW COMPARISON:  CT abdomen pelvis dated June 17, 2014. Abdominal x-ray dated July 24, 2012. FINDINGS: The bowel gas pattern is normal. Moderate colonic stool burden. No radio-opaque calculi or other significant radiographic abnormality are seen. IMPRESSION: Moderate colonic stool burden.  No obstruction. Electronically Signed   By: Titus Dubin M.D.   On: 12/18/2017 16:15    Scheduled Meds: . enoxaparin (LOVENOX) injection  40 mg Subcutaneous Q24H  . feeding supplement (ENSURE ENLIVE)  237 mL Oral BID BM  . folic acid  1 mg Oral Daily  . HYDROmorphone   Intravenous Q4H  .  HYDROmorphone (DILAUDID) injection  0.5 mg Subcutaneous Once  . ketorolac  30 mg Intravenous Q6H  . magnesium citrate  1 Bottle Oral Once  . oxyCODONE  10 mg Oral Q4H  . senna-docusate  1 tablet Oral BID   Continuous Infusions: . dextrose 5 % and 0.45% NaCl 10 mL/hr at 12/17/17 1535    Principal Problem:   Sickle cell anemia with crisis [facility]) Active Problems:   Leukocytosis   Substance abuse (HCC)   In excess of 35 minutes spent during this visit. Greater than 50% involved face to face contact with the patient for assessment, counseling and coordination of care.

## 2017-12-20 DIAGNOSIS — K5900 Constipation, unspecified: Secondary | ICD-10-CM

## 2017-12-20 LAB — CBC WITH DIFFERENTIAL/PLATELET
BAND NEUTROPHILS: 1 %
BASOS PCT: 0 %
Basophils Absolute: 0 10*3/uL (ref 0.0–0.1)
Eosinophils Absolute: 0.3 10*3/uL (ref 0.0–0.7)
Eosinophils Relative: 1 %
HCT: 15.2 % — ABNORMAL LOW (ref 36.0–46.0)
Hemoglobin: 5.6 g/dL — CL (ref 12.0–15.0)
LYMPHS ABS: 4.7 10*3/uL — AB (ref 0.7–4.0)
LYMPHS PCT: 15 %
MCH: 35.9 pg — AB (ref 26.0–34.0)
MCHC: 36.8 g/dL — ABNORMAL HIGH (ref 30.0–36.0)
MCV: 97.4 fL (ref 78.0–100.0)
MONO ABS: 1.2 10*3/uL — AB (ref 0.1–1.0)
MYELOCYTES: 1 %
Monocytes Relative: 4 %
NEUTROS ABS: 25 10*3/uL — AB (ref 1.7–7.7)
Neutrophils Relative %: 78 %
PLATELETS: 563 10*3/uL — AB (ref 150–400)
RBC: 1.56 MIL/uL — ABNORMAL LOW (ref 3.87–5.11)
RDW: 17 % — AB (ref 11.5–15.5)
WBC: 31.2 10*3/uL — ABNORMAL HIGH (ref 4.0–10.5)

## 2017-12-20 LAB — RETICULOCYTES
RBC.: 1.56 MIL/uL — ABNORMAL LOW (ref 3.87–5.11)
RETIC COUNT ABSOLUTE: 262.1 10*3/uL — AB (ref 19.0–186.0)
Retic Ct Pct: 16.8 % — ABNORMAL HIGH (ref 0.4–3.1)

## 2017-12-20 MED ORDER — SORBITOL 70 % SOLN
960.0000 mL | TOPICAL_OIL | Freq: Once | ORAL | Status: AC
  Administered 2017-12-20: 960 mL via RECTAL
  Filled 2017-12-20: qty 473

## 2017-12-20 NOTE — Progress Notes (Signed)
SICKLE CELL SERVICE PROGRESS NOTE  [name] [facility] [id number] DOB: 28-Feb-1996 DOA: 12/15/2017 PCP: Patient, No Pcp Per  Assessment/Plan: Principal Problem:   Sickle cell anemia with crisis ([place]) Active Problems:   Leukocytosis   Substance abuse ([place])   1. Leukocytosis: Patient continues to have an escalating white blood cell count.  I will order an enema since by her report she has not had a bowel movement.  She continues to have a rising white blood cell count as well as abdominal pain I will obtain a CT of the abdomen. 2. Normocytic anemia: Patient appears asymptomatic and I do believe this is probably related to her sickle cell and may be at her baseline.  Unfortunately the patient does not follow-up with continued care in the [facility] setting and so her baseline clinical status is unknown. 3. Constipation: Patient has not been able to have a bowel movement and so she will receive an enema this evening. 4. Hb SS with crisis: Continue scheduled oxycodone and continue PCA at current dose.  Toradol expected to be completed today. 5. Suprapubic pain: Patient continues to have suprapubic pain.  She will get an enema tonight.  If she still has pain despite the enema I will proceed with a CT of the abdomen. 6. Protein calorie malnutrition: Continue Ensure.  Code Status: Full Code Family Communication: N/A Disposition Plan: Not yet ready for discharge  [place].  Pager [phone]. If 7PM-7AM, please contact night-coverage.  12/20/2017, 4:07 PM  LOS: 4 days   Interim History: She continues to complain of pain approximately 6/10 mainly localized to the suprapubic region.  She was able to ambulate today in the hallway without any difficulty.  Saturations were 93-95% with ambulation.  Consultants:  None  Procedures:  None  Antibiotics:  None    Objective: Vitals:   12/20/17 0700 12/20/17 0809 12/20/17 1131 12/20/17 1434  BP: (!) 106/44   (!) 105/53  Pulse: 89    (!) 101  Resp: 14 12 18 18   Temp: 98.5 F (36.9 C)   98.6 F (37 C)  TempSrc: Oral   Oral  SpO2: 96% 98% 98% 98%  Weight:      Height:       Weight change:   Intake/Output Summary (Last 24 hours) at 12/20/2017 1607 Last data filed at 12/20/2017 1544 Gross per 24 hour  Intake 1326.51 ml  Output 700 ml  Net 626.51 ml       Physical Exam General: Alert, awake, oriented x3, in mild distress secondary to pain in the suprapubic area HEENT: [place]/AT PEERL, EOMI, mild icterus Heart: Regular rate and rhythm, without murmurs, rubs, gallops, PMI non-displaced, no heaves or thrills on palpation.  Lungs: Clear to auscultation, no wheezing or rhonchi noted. No increased vocal fremitus resonant to percussion  Abdomen: Soft, tender in the suprapubic area, nondistended, positive bowel sounds, no masses no hepatosplenomegaly noted.  Neuro: No focal neurological deficits noted cranial nerves II through XII grossly intact.  Strength at baseline in bilateral upper and lower extremities. Musculoskeletal: No warm swelling or erythema around joints, no spinal tenderness noted. Psychiatric: Patient alert and oriented x3, good insight and cognition, good recent to remote recall.   Data Reviewed: Basic Metabolic Panel: Recent Labs  Lab 12/15/17 2031 12/16/17 0545 12/19/17 1143  NA 137 138 136  K 4.6 4.3 4.5  CL 105 106 104  CO2 25 25 25   GLUCOSE 99 92 88  BUN 14 14 18   CREATININE 0.81 0.88 0.80  CALCIUM 8.8* 8.6* 8.5*   Liver Function Tests: Recent Labs  Lab 12/15/17 2031  AST 23  ALT 13*  ALKPHOS 67  BILITOT 4.0*  PROT 7.9  ALBUMIN 3.9   No results for input(s): LIPASE, AMYLASE in the last 168 hours. No results for input(s): AMMONIA in the last 168 hours. CBC: Recent Labs  Lab 12/15/17 2031 12/16/17 0545 12/19/17 1143 12/20/17 0913  WBC 15.6* 11.9* 27.3* 31.2*  NEUTROABS 10.4* 4.9 20.7* 25.0*  HGB 9.0* 8.1* 6.4* 5.6*  HCT 25.2* 22.5* 17.2* 15.2*  MCV 98.1 97.8 96.6 97.4   PLT 674* 545* 529* 563*   Cardiac Enzymes: Recent Labs  Lab 12/16/17 0208  TROPONINI <0.03   BNP (last 3 results) No results for input(s): BNP in the last 8760 hours.  ProBNP (last 3 results) No results for input(s): PROBNP in the last 8760 hours.  CBG: Recent Labs  Lab 12/19/17 2200  GLUCAP 110*    No results found for this or any previous visit (from the past 240 hour(s)).   Studies: Dg Abd 1 View  Result Date: 12/18/2017 CLINICAL DATA:  Lower abdominal pain. EXAM: ABDOMEN - 1 VIEW COMPARISON:  CT abdomen pelvis dated June 17, 2014. Abdominal x-ray dated July 24, 2012. FINDINGS: The bowel gas pattern is normal. Moderate colonic stool burden. No radio-opaque calculi or other significant radiographic abnormality are seen. IMPRESSION: Moderate colonic stool burden.  No obstruction. Electronically Signed   By: Titus Dubin M.D.   On: 12/18/2017 16:15    Scheduled Meds: . enoxaparin (LOVENOX) injection  40 mg Subcutaneous Q24H  . feeding supplement (ENSURE ENLIVE)  237 mL Oral BID BM  . folic acid  1 mg Oral Daily  . HYDROmorphone   Intravenous Q4H  . ketorolac  30 mg Intravenous Q6H  . oxyCODONE  10 mg Oral Q4H  . senna-docusate  1 tablet Oral BID  . sorbitol, milk of mag, mineral oil, glycerin (SMOG) enema  960 mL Rectal Once   Continuous Infusions: . dextrose 5 % and 0.45% NaCl 10 mL/hr at 12/17/17 1535    Principal Problem:   Sickle cell anemia with crisis [facility]) Active Problems:   Leukocytosis   Substance abuse (HCC)     In excess of 35 minutes spent during this visit. Greater than 50% involved face to face contact with the patient for assessment, counseling and coordination of care.

## 2017-12-20 NOTE — Progress Notes (Signed)
SATURATION QUALIFICATIONS: (This note is used to comply with regulatory documentation for home oxygen)  Patient Saturations on Room Air at Rest = 93%  Patient Saturations on Room Air while Ambulating = 95%   

## 2017-12-20 NOTE — Progress Notes (Signed)
Have attempted x 3 for patient to ambulate and instructed her that MD ordered for her oxygen levels to be checked while walking and patient has refused

## 2017-12-20 NOTE — Progress Notes (Signed)
Nutrition Brief Note  Patient identified for underweight BMI.   Wt Readings from Last 15 Encounters:  12/16/17 113 lb 5.1 oz (51.4 kg)  11/20/17 128 lb (58.1 kg)  07/08/17 113 lb (51.3 kg)  06/10/17 115 lb (52.2 kg)  02/27/17 113 lb 12.1 oz (51.6 kg)  02/14/17 110 lb (49.9 kg)  12/21/16 115 lb 8 oz (52.4 kg)  12/12/16 118 lb (53.5 kg)  10/20/16 118 lb 2.7 oz (53.6 kg)  05/16/16 121 lb 9.6 oz (55.2 kg)  11/05/15 145 lb 8.1 oz (66 kg)  08/13/15 146 lb 9.7 oz (66.5 kg)  03/10/15 144 lb 10 oz (65.6 kg)  03/08/15 135 lb (61.2 kg)  02/24/15 135 lb (61.2 kg)    Body mass index is 18.29 kg/m. Patient meets criteria for underweight based on current BMI. Skin WDL. Pt with hx of sickle cell anemia and polysubstance abuse. She was admitted for sickle cell anemia with crisis. She had used cocaine in the few days PTA, per MD note.   Current diet order is Regular, patient is consuming approximately 75-100% of meals at this time. Ensure Enlive ordered BID on 2/19. Each supplement provides 350 kcal and 20 grams of protein. Pt has accepted 4/5 bottles offered.   Medications reviewed; 1 mg oral folic acid/day, 1 tablet Senokot BID. Labs reviewed; Ca: 8.5 mg/dL. IVF: D5-1/2 NS @ 10 mL/hr (41 kcal).  No nutrition interventions warranted at this time. If nutrition issues arise, please consult RD.     Jarome Matin, MS, RD, LDN, [facility] Inpatient Clinical Dietitian Pager # [phone] After hours/weekend pager # [phone]

## 2017-12-21 ENCOUNTER — Inpatient Hospital Stay (HOSPITAL_COMMUNITY): Payer: Medicare Other

## 2017-12-21 ENCOUNTER — Encounter (HOSPITAL_COMMUNITY): Payer: Self-pay | Admitting: Radiology

## 2017-12-21 LAB — COMPREHENSIVE METABOLIC PANEL
ALBUMIN: 2.8 g/dL — AB (ref 3.5–5.0)
ALT: 26 U/L (ref 14–54)
ANION GAP: 10 (ref 5–15)
AST: 21 U/L (ref 15–41)
Alkaline Phosphatase: 86 U/L (ref 38–126)
BILIRUBIN TOTAL: 1.5 mg/dL — AB (ref 0.3–1.2)
BUN: 12 mg/dL (ref 6–20)
CHLORIDE: 101 mmol/L (ref 101–111)
CO2: 25 mmol/L (ref 22–32)
Calcium: 8.5 mg/dL — ABNORMAL LOW (ref 8.9–10.3)
Creatinine, Ser: 0.97 mg/dL (ref 0.44–1.00)
GFR calc Af Amer: >60 mL/min (ref >60)
GFR calc non Af Amer: >60 mL/min (ref >60)
Glucose, Bld: 113 mg/dL — ABNORMAL HIGH (ref 65–99)
POTASSIUM: 4.6 mmol/L (ref 3.5–5.1)
SODIUM: 136 mmol/L (ref 135–145)
Total Protein: 6.7 g/dL (ref 6.5–8.1)

## 2017-12-21 LAB — CBC WITH DIFFERENTIAL/PLATELET
BASOS ABS: 0 10*3/uL (ref 0.0–0.1)
BLASTS: 0 %
Band Neutrophils: 0 %
Basophils Relative: 0 %
Eosinophils Absolute: 0 10*3/uL (ref 0.0–0.7)
Eosinophils Relative: 0 %
HEMATOCRIT: 13.7 % — AB (ref 36.0–46.0)
Hemoglobin: 5 g/dL — CL (ref 12.0–15.0)
Lymphocytes Relative: 16 %
Lymphs Abs: 5.8 10*3/uL — ABNORMAL HIGH (ref 0.7–4.0)
MCH: 34.7 pg — ABNORMAL HIGH (ref 26.0–34.0)
MCHC: 36.5 g/dL — ABNORMAL HIGH (ref 30.0–36.0)
MCV: 95.1 fL (ref 78.0–100.0)
METAMYELOCYTES PCT: 0 %
Monocytes Absolute: 1.1 10*3/uL — ABNORMAL HIGH (ref 0.1–1.0)
Monocytes Relative: 3 %
Myelocytes: 0 %
Neutro Abs: 29.4 10*3/uL — ABNORMAL HIGH (ref 1.7–7.7)
Neutrophils Relative %: 81 %
Other: 0 %
PROMYELOCYTES ABS: 0 %
Platelets: 539 10*3/uL — ABNORMAL HIGH (ref 150–400)
RBC: 1.44 MIL/uL — AB (ref 3.87–5.11)
RDW: 17.9 % — ABNORMAL HIGH (ref 11.5–15.5)
WBC: 36.3 10*3/uL — AB (ref 4.0–10.5)
nRBC: 1 /100 WBC — ABNORMAL HIGH

## 2017-12-21 LAB — RETICULOCYTES
RBC.: 1.44 MIL/uL — ABNORMAL LOW (ref 3.87–5.11)
Retic Count, Absolute: 260.6 10*3/uL — ABNORMAL HIGH (ref 19.0–186.0)
Retic Ct Pct: 18.1 % — ABNORMAL HIGH (ref 0.4–3.1)

## 2017-12-21 LAB — PREPARE RBC (CROSSMATCH)

## 2017-12-21 MED ORDER — SODIUM CHLORIDE 0.9 % IV SOLN: Freq: Once | INTRAVENOUS | Status: AC

## 2017-12-21 MED ORDER — IOPAMIDOL (ISOVUE-300) INJECTION 61%: 15.0000 mL | Freq: Two times a day (BID) | INTRAVENOUS | Status: DC | PRN

## 2017-12-21 MED ORDER — SODIUM CHLORIDE 0.9 % IV SOLN
Freq: Once | INTRAVENOUS | Status: AC
  Administered 2017-12-21: 12:00:00 via INTRAVENOUS

## 2017-12-21 MED ORDER — IOPAMIDOL (ISOVUE-300) INJECTION 61%
INTRAVENOUS | Status: AC
  Filled 2017-12-21: qty 100

## 2017-12-21 MED ORDER — IOPAMIDOL (ISOVUE-300) INJECTION 61%
INTRAVENOUS | Status: AC
  Filled 2017-12-21: qty 30

## 2017-12-21 NOTE — Progress Notes (Signed)
SICKLE CELL SERVICE PROGRESS NOTE  Antonella Upson [facility] FMB:[id number] DOB: 09-25-1996 DOA: 12/15/2017 PCP: Patient, No Pcp Per  Assessment/Plan: Principal Problem:   Sickle cell anemia with crisis ([place]) Active Problems:   Leukocytosis   Substance abuse ([place])   1. Normocytic anemia: The patient's hemoglobin continues to drift down without any evidence of hemolysis or blood loss.  Will transfuse 1 unit of RBCs. 2. Leukocytosis: Patient continues to have an escalating white blood cell count.  I have ordered a CT of the abdomen and pelvis with contrast to further evaluate for any intra-abdominal abnormalities.   3. Constipation: Patient had a bowel movement in response to the enema yesterday  4. Hb SS with crisis: Decrease frequency of scheduled oxycodone and decrease frequency of PCA available bolus doses.  Toradol completed yesterday  5. Suprapubic pain: Patient continues to have suprapubic pain. I will proceed with a CT of the abdomen. 6. Protein calorie malnutrition: Continue Ensure.  Code Status: Full Code Family Communication: N/A Disposition Plan: Not yet ready for discharge  [place].  Pager [phone]. If 7PM-7AM, please contact night-coverage.  12/21/2017, 3:25 PM  LOS: 5 days   Interim History: She continues to complain of pain approximately 7-810 mainly localized to the suprapubic region.  She is used 19 mg of Dilaudid on the PCA with 63/69: Demand/deliveries in the last 24 hours.  Although she does not appear toxic she is very somnolent.  Consultants:  None  Procedures:  None  Antibiotics:  None    Objective: Vitals:   12/21/17 1214 12/21/17 1315 12/21/17 1400 12/21/17 1435  BP:  (!) 97/48 (!) 102/50 104/60  Pulse:  98 (!) 102 82  Resp: 14 17 14 16   Temp:  98.5 F (36.9 C) 98.8 F (37.1 C) 98.6 F (37 C)  TempSrc:  Oral Oral Oral  SpO2: 92% 98% 97% 96%  Weight:      Height:       Weight change:   Intake/Output Summary (Last 24 hours) at  12/21/2017 1525 Last data filed at 12/21/2017 1501 Gross per 24 hour  Intake 919.5 ml  Output -  Net 919.5 ml       Physical Exam General: Alert, awake, oriented x3, in mild distress secondary to pain in the suprapubic area HEENT: [place]/AT PEERL, EOMI, mild icterus Heart: Regular rate and rhythm, without murmurs, rubs, gallops, PMI non-displaced, no heaves or thrills on palpation.  Lungs: Clear to auscultation, no wheezing or rhonchi noted. No increased vocal fremitus resonant to percussion  Abdomen: Soft, tender in the suprapubic area, nondistended, positive bowel sounds, no masses no hepatosplenomegaly noted.  Neuro: No focal neurological deficits noted cranial nerves II through XII grossly intact.  Strength at baseline in bilateral upper and lower extremities. Musculoskeletal: No warm swelling or erythema around joints, no spinal tenderness noted. Psychiatric: Patient alert and oriented x3, good insight and cognition, good recent to remote recall.   Data Reviewed: Basic Metabolic Panel: Recent Labs  Lab 12/15/17 2031 12/16/17 0545 12/19/17 1143 12/21/17 0951  NA 137 138 136 136  K 4.6 4.3 4.5 4.6  CL 105 106 104 101  CO2 25 25 25 25   GLUCOSE 99 92 88 113*  BUN 14 14 18 12   CREATININE 0.81 0.88 0.80 0.97  CALCIUM 8.8* 8.6* 8.5* 8.5*   Liver Function Tests: Recent Labs  Lab 12/15/17 2031 12/21/17 0951  AST 23 21  ALT 13* 26  ALKPHOS 67 86  BILITOT 4.0* 1.5*  PROT 7.9 6.7  ALBUMIN 3.9 2.8*   No results for input(s): LIPASE, AMYLASE in the last 168 hours. No results for input(s): AMMONIA in the last 168 hours. CBC: Recent Labs  Lab 12/15/17 2031 12/16/17 0545 12/19/17 1143 12/20/17 0913 12/21/17 0951  WBC 15.6* 11.9* 27.3* 31.2* 36.3*  NEUTROABS 10.4* 4.9 20.7* 25.0* 29.4*  HGB 9.0* 8.1* 6.4* 5.6* 5.0*  HCT 25.2* 22.5* 17.2* 15.2* 13.7*  MCV 98.1 97.8 96.6 97.4 95.1  PLT 674* 545* 529* 563* 539*   Cardiac Enzymes: Recent Labs  Lab 12/16/17 0208   TROPONINI <0.03   BNP (last 3 results) No results for input(s): BNP in the last 8760 hours.  ProBNP (last 3 results) No results for input(s): PROBNP in the last 8760 hours.  CBG: Recent Labs  Lab 12/19/17 2200  GLUCAP 110*    No results found for this or any previous visit (from the past 240 hour(s)).   Studies: Dg Abd 1 View  Result Date: 12/18/2017 CLINICAL DATA:  Lower abdominal pain. EXAM: ABDOMEN - 1 VIEW COMPARISON:  CT abdomen pelvis dated June 17, 2014. Abdominal x-ray dated July 24, 2012. FINDINGS: The bowel gas pattern is normal. Moderate colonic stool burden. No radio-opaque calculi or other significant radiographic abnormality are seen. IMPRESSION: Moderate colonic stool burden.  No obstruction. Electronically Signed   By: Titus Dubin M.D.   On: 12/18/2017 16:15    Scheduled Meds: . iopamidol      . enoxaparin (LOVENOX) injection  40 mg Subcutaneous Q24H  . feeding supplement (ENSURE ENLIVE)  237 mL Oral BID BM  . folic acid  1 mg Oral Daily  . HYDROmorphone   Intravenous Q4H  . oxyCODONE  10 mg Oral Q4H  . senna-docusate  1 tablet Oral BID   Continuous Infusions: . dextrose 5 % and 0.45% NaCl 10 mL/hr at 12/17/17 1535    Principal Problem:   Sickle cell anemia with crisis [facility]) Active Problems:   Leukocytosis   Substance abuse (HCC)     In excess of 35 minutes spent during this visit. Greater than 50% involved face to face contact with the patient for assessment, counseling and coordination of care.

## 2017-12-21 NOTE — Progress Notes (Signed)
Pt returned from CT visibly upset as her IV site was leaking and attempts to start another were futile.  Radiology staff explained what had happened and that pt refused test and was upset at them for not starting another IV as she was in pain.  I was able to calm pt down and restart her PCA at another site.  Pt still refusing to have the CT.

## 2017-12-22 ENCOUNTER — Inpatient Hospital Stay (HOSPITAL_COMMUNITY): Payer: Medicare Other

## 2017-12-22 DIAGNOSIS — R1084 Generalized abdominal pain: Secondary | ICD-10-CM

## 2017-12-22 LAB — CBC WITH DIFFERENTIAL/PLATELET
BASOS PCT: 0 %
Basophils Absolute: 0 10*3/uL (ref 0.0–0.1)
EOS ABS: 0.2 10*3/uL (ref 0.0–0.7)
EOS PCT: 1 %
HCT: 17.7 % — ABNORMAL LOW (ref 36.0–46.0)
Hemoglobin: 6.3 g/dL — CL (ref 12.0–15.0)
LYMPHS ABS: 5.1 10*3/uL — AB (ref 0.7–4.0)
Lymphocytes Relative: 22 %
MCH: 33.5 pg (ref 26.0–34.0)
MCHC: 35.6 g/dL (ref 30.0–36.0)
MCV: 94.1 fL (ref 78.0–100.0)
Monocytes Absolute: 1.8 10*3/uL — ABNORMAL HIGH (ref 0.1–1.0)
Monocytes Relative: 8 %
NEUTROS PCT: 69 %
Neutro Abs: 16 10*3/uL — ABNORMAL HIGH (ref 1.7–7.7)
Platelets: 581 10*3/uL — ABNORMAL HIGH (ref 150–400)
RBC: 1.88 MIL/uL — ABNORMAL LOW (ref 3.87–5.11)
RDW: 18.3 % — AB (ref 11.5–15.5)
WBC: 23.1 10*3/uL — AB (ref 4.0–10.5)

## 2017-12-22 LAB — BPAM RBC
Blood Product Expiration Date: 201903172359
ISSUE DATE / TIME: 201902221405
UNIT TYPE AND RH: 5100

## 2017-12-22 LAB — TYPE AND SCREEN
ABO/RH(D): B POS
ANTIBODY SCREEN: NEGATIVE
UNIT DIVISION: 0

## 2017-12-22 LAB — RETICULOCYTES
RBC.: 1.88 MIL/uL — ABNORMAL LOW (ref 3.87–5.11)
RETIC CT PCT: 15.6 % — AB (ref 0.4–3.1)
Retic Count, Absolute: 293.3 10*3/uL — ABNORMAL HIGH (ref 19.0–186.0)

## 2017-12-22 MED ORDER — IOPAMIDOL (ISOVUE-300) INJECTION 61%
30.0000 mL | Freq: Once | INTRAVENOUS | Status: AC | PRN
Start: 2017-12-22 — End: 2017-12-22
  Administered 2017-12-22: 30 mL via ORAL

## 2017-12-22 MED ORDER — LACTULOSE 10 GM/15ML PO SOLN
30.0000 g | ORAL | Status: AC
  Administered 2017-12-22: 30 g via ORAL
  Filled 2017-12-22: qty 45

## 2017-12-22 MED ORDER — IOPAMIDOL (ISOVUE-300) INJECTION 61%
INTRAVENOUS | Status: AC
  Administered 2017-12-22: 100 mL
  Filled 2017-12-22: qty 100

## 2017-12-22 NOTE — Progress Notes (Signed)
Patient walking halls saying she "cannot wait thirty  Minutes for the doctor to call back. He's not gonna write me scripts anyway". Informed patient that she would need to sign AMA papers and she agreed and signed. Eulas Post, RN

## 2017-12-22 NOTE — Progress Notes (Signed)
Patient ID: [name], female   DOB: 1996/04/03, 22 y.o.   MRN: [id number] Subjective:  Patient still complaining of abdominal pain, no distension, no vomiting, no nausea. She had a BM yesterday after an enema but still feels constipated. Pain is much improved generally. No fever. No urinary symptom. No chest pain, no SOB.  Objective:  Vital signs in last 24 hours:  Vitals:   12/22/17 0457 12/22/17 0636 12/22/17 0926 12/22/17 0931  BP:  102/65  109/63  Pulse:  79  85  Resp: 10 17 14 15   Temp:  98 F (36.7 C)    TempSrc:  Axillary    SpO2: 96% 99% 99% 100%  Weight:      Height:       Intake/Output from previous day:   Intake/Output Summary (Last 24 hours) at 12/22/2017 1023 Last data filed at 12/22/2017 0932 Gross per 24 hour  Intake 1789 ml  Output -  Net 1789 ml    Physical Exam: General: Alert, awake, oriented x3, in no acute distress.  HEENT: [place]/AT PEERL, EOMI Neck: Trachea midline,  no masses, no thyromegal,y no JVD, no carotid bruit OROPHARYNX:  Moist, No exudate/ erythema/lesions.  Heart: Regular rate and rhythm, without murmurs, rubs, gallops, PMI non-displaced, no heaves or thrills on palpation.  Lungs: Clear to auscultation, no wheezing or rhonchi noted. No increased vocal fremitus resonant to percussion  Abdomen: Soft, nontender, nondistended, positive bowel sounds, no masses no hepatosplenomegaly noted..  Neuro: No focal neurological deficits noted cranial nerves II through XII grossly intact. DTRs 2+ bilaterally upper and lower extremities. Strength 5 out of 5 in bilateral upper and lower extremities. Musculoskeletal: No warm swelling or erythema around joints, no spinal tenderness noted. Psychiatric: Patient alert and oriented x3, good insight and cognition, good recent to remote recall. Lymph node survey: No cervical axillary or inguinal lymphadenopathy noted.  Lab Results:  Basic Metabolic Panel:    Component Value Date/Time   NA 136 12/21/2017  0951   K 4.6 12/21/2017 0951   CL 101 12/21/2017 0951   CO2 25 12/21/2017 0951   BUN 12 12/21/2017 0951   CREATININE 0.97 12/21/2017 0951   GLUCOSE 113 (H) 12/21/2017 0951   CALCIUM 8.5 (L) 12/21/2017 0951   CBC:    Component Value Date/Time   WBC 23.1 (H) 12/22/2017 0531   HGB 6.3 (LL) 12/22/2017 0531   HCT 17.7 (L) 12/22/2017 0531   PLT 581 (H) 12/22/2017 0531   MCV 94.1 12/22/2017 0531   NEUTROABS 16.0 (H) 12/22/2017 0531   LYMPHSABS 5.1 (H) 12/22/2017 0531   MONOABS 1.8 (H) 12/22/2017 0531   EOSABS 0.2 12/22/2017 0531   BASOSABS 0.0 12/22/2017 0531    No results found for this or any previous visit (from the past 240 hour(s)).  Studies/Results: No results found.  Medications: Scheduled Meds: . enoxaparin (LOVENOX) injection  40 mg Subcutaneous Q24H  . feeding supplement (ENSURE ENLIVE)  237 mL Oral BID BM  . folic acid  1 mg Oral Daily  . HYDROmorphone   Intravenous Q4H  . oxyCODONE  10 mg Oral Q4H  . senna-docusate  1 tablet Oral BID   Continuous Infusions: . dextrose 5 % and 0.45% NaCl 10 mL/hr at 12/17/17 1535   PRN Meds:.albuterol, diphenhydrAMINE **OR** [DISCONTINUED] diphenhydrAMINE, iopamidol, naloxone **AND** sodium chloride flush, ondansetron (ZOFRAN) IV, polyethylene glycol  Assessment/Plan: Principal Problem:   Sickle cell anemia with crisis ([name]) Active Problems:   Leukocytosis   Substance abuse ([name])   1.  Normocytic anemia: S/P 1 unit of RBCs transfusion. Hb is 6.3 today, patient asymptomatic. 2. Leukocytosis: Patient WBCC today has dropped to 23.1 from 36.3 yesterday. CT of the abdomen and pelvis with contrast showed 1. No definite explanation for patient's elevated white blood cell count and lower/mid abdominal pain. Specifically, no evidence of enteric or urinary obstruction. Normal appearance of the appendix. 2. Large colonic stool burden. Will give Lactulose for BM 3. Constipation: Patient had a bowel movement in response to the enema  yesterday, still has large stool burden in the colon, will give Lactulose  4. Hb SS with crisis: Decrease frequency of scheduled oxycodone and decrease frequency of PCA available bolus doses. Toradol completed 5. Protein calorie malnutrition: Continue Ensure  Code Status: Full Code Family Communication: N/A Disposition Plan: For Possible Discharge tomorrow  [name]  If 7PM-7AM, please contact night-coverage.  12/22/2017, 10:23 AM  LOS: 6 days

## 2017-12-22 NOTE — Progress Notes (Signed)
Patient states she "wants to be discharged. I have a baby to go get'. Informed her that RN would have to contact MD to get order for her discharge. Text page was made to MD on call. Eulas Post, RN

## 2018-01-13 NOTE — Discharge Summary (Signed)
Physician Discharge Summary  [name] FHQ:[id number] DOB: 1996-04-01 DOA: 12/15/2017  PCP: Patient, No Pcp Per  Admit date: 12/15/2017  Discharge date: 01/13/2018  Discharge Diagnoses:  Principal Problem:   Sickle cell anemia with crisis [facility]) Active Problems:   Abdominal pain   Sickle cell pain crisis ([place])   Leukocytosis   Substance abuse ([place])  Discharge Condition: Left AMA  Wt Readings from Last 3 Encounters:  12/16/17 51.4 kg (113 lb 5.1 oz)  11/20/17 58.1 kg (128 lb)  07/08/17 51.3 kg (113 lb)   History of present illness:  [name] is a 22 y.o. female with medical history significant of sickle cell anemia and  polysubstance abuse; who presents with complaints of bilateral leg pain over the last 1-2 days.  She reports pain feels similar to previous sickle cell pain crises.  Associated symptoms include generalized malaise.  She is not on narcotic pain medication and reports nothing helped relieve symptoms.  Denies any recent falls/trauma, fever, chills, chest pain, shortness of breath, nausea, vomiting, abdominal pain, diarrhea, or dysuria symptoms.  She admits to using cocaine last couple days.  She does not have a primary care provider.  ED Course: Admission into the emergency department patient was noted to be afebrile, respirations 13-26, and all other vital signs relatively within normal limits.  Labs revealed WBC 15.6, hemoglobin 9, platelets 674.  Patient was given multiple rounds of Dilaudid while in the ED without resolution of pain.  TRH called to admit.  Hospital Course:  Patient was admitted for Sickle Cell Pain Crisis on 12/15/2017 and was managed with appropriate Sickle Cell Pain management protocols. She left AMA on 12/22/2017. While on admission, CSW was consulted for polysubstance abuse and homelessness. Patient reported that she is currently staying with someone while she looks for housing. Patient reports that she receives disability. CSW  inquired about how much patient receives in income, patient replied "enough". Patient reported that she is actively seeking housing and is not currently homeless. Patient reported no care giving concerns.   Patient left AMA on 12/22/2017.   Signed:  Angelica Chessman  [facility] 01/13/2018, 3:35 PM

## 2018-03-05 ENCOUNTER — Other Ambulatory Visit: Payer: Self-pay

## 2018-03-05 ENCOUNTER — Encounter (HOSPITAL_COMMUNITY): Payer: Self-pay | Admitting: Emergency Medicine

## 2018-03-05 ENCOUNTER — Emergency Department (HOSPITAL_COMMUNITY)
Admission: EM | Admit: 2018-03-05 | Discharge: 2018-03-05 | Disposition: A | Discharge status: Home / Self Care | Payer: Medicare Other | Source: Home / Self Care | Attending: Emergency Medicine | Admitting: Emergency Medicine

## 2018-03-05 ENCOUNTER — Inpatient Hospital Stay (HOSPITAL_COMMUNITY)
Admission: EM | Admit: 2018-03-05 | Discharge: 2018-03-09 | DRG: 812 | Discharge status: Against Medical Advice | Payer: Medicare Other | Attending: Internal Medicine | Admitting: Internal Medicine

## 2018-03-05 DIAGNOSIS — D5700 Hb-SS disease with crisis, unspecified: Secondary | ICD-10-CM | POA: Insufficient documentation

## 2018-03-05 DIAGNOSIS — Z832 Family history of diseases of the blood and blood-forming organs and certain disorders involving the immune mechanism: Secondary | ICD-10-CM

## 2018-03-05 DIAGNOSIS — J45909 Unspecified asthma, uncomplicated: Secondary | ICD-10-CM | POA: Insufficient documentation

## 2018-03-05 DIAGNOSIS — F1910 Other psychoactive substance abuse, uncomplicated: Secondary | ICD-10-CM | POA: Diagnosis present

## 2018-03-05 DIAGNOSIS — F17210 Nicotine dependence, cigarettes, uncomplicated: Secondary | ICD-10-CM | POA: Diagnosis present

## 2018-03-05 DIAGNOSIS — D72829 Elevated white blood cell count, unspecified: Secondary | ICD-10-CM | POA: Diagnosis present

## 2018-03-05 DIAGNOSIS — R51 Headache: Secondary | ICD-10-CM | POA: Diagnosis not present

## 2018-03-05 DIAGNOSIS — F1210 Cannabis abuse, uncomplicated: Secondary | ICD-10-CM | POA: Diagnosis present

## 2018-03-05 DIAGNOSIS — F1410 Cocaine abuse, uncomplicated: Secondary | ICD-10-CM | POA: Diagnosis present

## 2018-03-05 DIAGNOSIS — Z9081 Acquired absence of spleen: Secondary | ICD-10-CM

## 2018-03-05 LAB — COMPREHENSIVE METABOLIC PANEL
ALBUMIN: 3.8 g/dL (ref 3.5–5.0)
ALT: 13 U/L — ABNORMAL LOW (ref 14–54)
ALT: 12 U/L — AB (ref 14–54)
AST: 22 U/L (ref 15–41)
AST: 23 U/L (ref 15–41)
Albumin: 4.4 g/dL (ref 3.5–5.0)
Alkaline Phosphatase: 59 U/L (ref 38–126)
Alkaline Phosphatase: 60 U/L (ref 38–126)
Anion gap: 9 (ref 5–15)
Anion gap: 9 (ref 5–15)
BILIRUBIN TOTAL: 7.3 mg/dL — AB (ref 0.3–1.2)
BUN: 9 mg/dL (ref 6–20)
BUN: 16 mg/dL (ref 6–20)
CHLORIDE: 109 mmol/L (ref 101–111)
CO2: 24 mmol/L (ref 22–32)
CO2: 25 mmol/L (ref 22–32)
CREATININE: 0.89 mg/dL (ref 0.44–1.00)
Calcium: 9.1 mg/dL (ref 8.9–10.3)
Calcium: 8.9 mg/dL (ref 8.9–10.3)
Chloride: 108 mmol/L (ref 101–111)
Creatinine, Ser: 0.84 mg/dL (ref 0.44–1.00)
GFR calc Af Amer: >60 mL/min (ref >60)
GFR calc non Af Amer: >60 mL/min (ref >60)
GLUCOSE: 117 mg/dL — AB (ref 65–99)
Glucose, Bld: 83 mg/dL (ref 65–99)
POTASSIUM: 3.5 mmol/L (ref 3.5–5.1)
POTASSIUM: 3.9 mmol/L (ref 3.5–5.1)
Sodium: 142 mmol/L (ref 135–145)
Sodium: 142 mmol/L (ref 135–145)
TOTAL PROTEIN: 7.8 g/dL (ref 6.5–8.1)
TOTAL PROTEIN: 6.9 g/dL (ref 6.5–8.1)
Total Bilirubin: 7.1 mg/dL — ABNORMAL HIGH (ref 0.3–1.2)

## 2018-03-05 LAB — CBC WITH DIFFERENTIAL/PLATELET
BASOS ABS: 0 10*3/uL (ref 0.0–0.1)
BASOS ABS: 0 10*3/uL (ref 0.0–0.1)
Basophils Relative: 0 %
Basophils Relative: 0 %
EOS ABS: 0.2 10*3/uL (ref 0.0–0.7)
EOS ABS: 0.1 10*3/uL (ref 0.0–0.7)
EOS PCT: 1 %
Eosinophils Relative: 1 %
HCT: 24.3 % — ABNORMAL LOW (ref 36.0–46.0)
HEMATOCRIT: 21.2 % — AB (ref 36.0–46.0)
Hemoglobin: 8.6 g/dL — ABNORMAL LOW (ref 12.0–15.0)
Hemoglobin: 7.4 g/dL — ABNORMAL LOW (ref 12.0–15.0)
LYMPHS ABS: 5.3 10*3/uL (ref 0.7–4.0)
LYMPHS PCT: 26 %
LYMPHS PCT: 36 %
Lymphs Abs: 4 10*3/uL (ref 0.7–4.0)
MCH: 35.1 pg — ABNORMAL HIGH (ref 26.0–34.0)
MCH: 34.7 pg — ABNORMAL HIGH (ref 26.0–34.0)
MCHC: 35.4 g/dL (ref 30.0–36.0)
MCHC: 34.9 g/dL (ref 30.0–36.0)
MCV: 99.2 fL (ref 78.0–100.0)
MCV: 99.5 fL (ref 78.0–100.0)
MONO ABS: 1.5 10*3/uL (ref 0.1–1.0)
MONOS PCT: 11 %
Monocytes Absolute: 1.7 10*3/uL — ABNORMAL HIGH (ref 0.1–1.0)
Monocytes Relative: 10 %
NEUTROS PCT: 62 %
Neutro Abs: 9.3 10*3/uL — ABNORMAL HIGH (ref 1.7–7.7)
Neutro Abs: 7.8 10*3/uL (ref 1.7–7.7)
Neutrophils Relative %: 53 %
Platelets: 580 10*3/uL — ABNORMAL HIGH (ref 150–400)
Platelets: 498 10*3/uL — ABNORMAL HIGH (ref 150–400)
RBC: 2.45 MIL/uL — AB (ref 3.87–5.11)
RBC: 2.13 MIL/uL — ABNORMAL LOW (ref 3.87–5.11)
RDW: 21 % — ABNORMAL HIGH (ref 11.5–15.5)
RDW: 20.7 % — AB (ref 11.5–15.5)
WBC: 15.2 10*3/uL — AB (ref 4.0–10.5)
WBC: 14.8 10*3/uL — ABNORMAL HIGH (ref 4.0–10.5)
nRBC: 3 /100 WBC — ABNORMAL HIGH

## 2018-03-05 LAB — I-STAT BETA HCG BLOOD, ED (MC, WL, AP ONLY): I-stat hCG, quantitative: <5 m[IU]/mL (ref <5)

## 2018-03-05 LAB — RETICULOCYTES
RBC.: 2.45 MIL/uL — AB (ref 3.87–5.11)
RBC.: 2.13 MIL/uL — ABNORMAL LOW (ref 3.87–5.11)
RETIC COUNT ABSOLUTE: 509.6 10*3/uL — AB (ref 19.0–186.0)
RETIC CT PCT: 20.8 % — AB (ref 0.4–3.1)
Retic Count, Absolute: 440.9 10*3/uL — ABNORMAL HIGH (ref 19.0–186.0)
Retic Ct Pct: 20.7 % — ABNORMAL HIGH (ref 0.4–3.1)

## 2018-03-05 MED ORDER — HYDROMORPHONE HCL 2 MG/ML IJ SOLN: 2.0000 mg | INTRAMUSCULAR | Status: DC

## 2018-03-05 MED ORDER — ONDANSETRON HCL 4 MG/2ML IJ SOLN: 4.0000 mg | INTRAMUSCULAR | Status: DC | PRN

## 2018-03-05 MED ORDER — KETOROLAC TROMETHAMINE 30 MG/ML IJ SOLN
30.0000 mg | INTRAMUSCULAR | Status: AC
  Administered 2018-03-05: 30 mg via INTRAVENOUS
  Filled 2018-03-05: qty 1

## 2018-03-05 MED ORDER — DIPHENHYDRAMINE HCL 50 MG/ML IJ SOLN: 25.0000 mg | Freq: Once | INTRAMUSCULAR | Status: DC

## 2018-03-05 MED ORDER — HYDROMORPHONE HCL 2 MG/ML IJ SOLN: 2.0000 mg | INTRAMUSCULAR | Status: AC

## 2018-03-05 NOTE — ED Provider Notes (Signed)
[place] DEPT Provider Note   CSN: [id number] Arrival date & time: 03/05/18  1220     History   Chief Complaint Chief Complaint  Patient presents with  . Sickle Cell Pain Crisis    HPI [name] is a 22 y.o. female.  Pt presents to the ED today with sickle cell pain.  She has a hx of SCD (Hb-SS) and does not have a pcp to care for her sickle cell.  She said she's been "lazy" and has not gotten one.  The pt is not on any home meds for her sickle cell.  She said her pain today is in her arms and in her legs which is normal for her sickle cell pain.  She denies cp, sob, f/c.     Past Medical History:  Diagnosis Date  . Acute kidney injury ([place]) 05/15/2016  . Amenorrhea   . Anemia    SICKLE CELL  . Asthma   . Drug-induced pruritus 02/26/2015  . GERD (gastroesophageal reflux disease)   . Headache   . Hyperbilirubinemia 02/26/2015  . Sickle cell disease [facility])     Patient Active Problem List   Diagnosis Date Noted  . Sickle cell anemia with crisis ([place]) 12/16/2017  . Substance abuse ([place]) 12/16/2017  . Homelessness 02/12/2017  . Protein-calorie malnutrition, severe 10/21/2016  . Anemia due to other cause   . Hb-SS disease without crisis ([place])   . Hereditary hemolytic anemia ([place])   . 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 Tobacco abuse   . Leukocytosis   . Sickle cell pain crisis ([place]) 02/15/2013  . Abdominal pain 07/25/2012  . Asthma, mild intermittent 12/14/2010    Past Surgical History:  Procedure Laterality Date  . 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 TONSILLECTOMY     Age 39     OB History   None      Home Medications    Prior to Admission medications   Medication Sig Start Date End Date Taking? Authorizing Provider  ibuprofen (ADVIL,MOTRIN) 600 MG tablet Take 1 tablet (600 mg total) by mouth every 6 (six) hours as needed. Patient not taking: Reported on 11/20/2017 07/26/17    Domenic Moras, PA-C    Family History Family History  Problem Relation Age of Onset  . Hypertension Paternal Grandfather   . Sickle cell trait Father   . Cancer Mother        Died in 02-05-2009    Social History Social History   Tobacco Use  . Smoking status: Current Every Day Smoker    Packs/day: 0.50    Types: Cigarettes  . Smokeless tobacco: Never Used  Substance Use Topics  . Alcohol use: No    Alcohol/week: 3.6 oz    Types: 6 Shots of liquor per week    Comment: Denies 06/12/2014  . Drug use: Yes    Types: Marijuana, Cocaine     Allergies   Patient has no known allergies.   Review of Systems Review of Systems  Musculoskeletal:       Bilateral leg and arm pain.  All other systems reviewed and are negative.    Physical Exam Updated Vital Signs BP (!) 102/44   Pulse 69   Temp 98.3 F (36.8 C) (Oral)   Resp 18   Ht 5\' 5"  (1.651 m)   LMP 03/04/2018   SpO2 97%   BMI 18.86 kg/m   Physical Exam  Constitutional:  She is oriented to person, place, and time. She appears well-developed and well-nourished.  HENT:  Head: Normocephalic and atraumatic.  Right Ear: External ear normal.  Left Ear: External ear normal.  Nose: Nose normal.  Mouth/Throat: Oropharynx is clear and moist.  Eyes: Pupils are equal, round, and reactive to light. Conjunctivae and EOM are normal.  Neck: Normal range of motion. Neck supple.  Cardiovascular: Normal rate, regular rhythm, normal heart sounds and intact distal pulses.  Pulmonary/Chest: Effort normal and breath sounds normal.  Abdominal: Soft. Bowel sounds are normal.  Musculoskeletal: Normal range of motion.  Neurological: She is alert and oriented to person, place, and time.  Skin: Skin is warm and dry. Capillary refill takes less than 2 seconds.  Psychiatric: She has a normal mood and affect. Her behavior is normal. Judgment and thought content normal.  Nursing note and vitals reviewed.    ED Treatments / Results  Labs (all  labs ordered are listed, but only abnormal results are displayed) Labs Reviewed  COMPREHENSIVE METABOLIC PANEL - Abnormal; Notable for the following components:      Result Value   ALT 13 (*)    Total Bilirubin 7.1 (*)    All other components within normal limits  CBC WITH DIFFERENTIAL/PLATELET - Abnormal; Notable for the following components:   WBC 15.2 (*)    RBC 2.45 (*)    Hemoglobin 8.6 (*)    HCT 24.3 (*)    MCH 35.1 (*)    RDW 21.0 (*)    Platelets 580 (*)    nRBC 3 (*)    Neutro Abs 9.3 (*)    Monocytes Absolute 1.7 (*)    All other components within normal limits  RETICULOCYTES - Abnormal; Notable for the following components:   Retic Ct Pct 20.8 (*)    RBC. 2.45 (*)    Retic Count, Absolute 509.6 (*)    All other components within normal limits  I-STAT BETA HCG BLOOD, ED ([facility], [facility], [facility] ONLY)    EKG None  Radiology No results found.  Procedures Procedures (including critical care time)  Medications Ordered in ED Medications  HYDROmorphone (DILAUDID) injection 2 mg (has no administration in time range)    Or  HYDROmorphone (DILAUDID) injection 2 mg (has no administration in time range)  HYDROmorphone (DILAUDID) injection 2 mg (has no administration in time range)    Or  HYDROmorphone (DILAUDID) injection 2 mg (has no administration in time range)  ketorolac (TORADOL) 30 MG/ML injection 30 mg (30 mg Intravenous Given 03/05/18 1747)     Initial Impression / Assessment and Plan / ED Course  I have reviewed the triage vital signs and the nursing notes.  Pertinent labs & imaging results that were available during my care of the patient were reviewed by me and considered in my medical decision making (see chart for details).    Pt was not given dilaudid as she was very somnolent.  She has a hx of polysubstance abuse, so I suspect she medicated herself prior to arrival here.  Pain is gone after toradol.  She is d/c home with instructions to f/u with the sickle cell  clinic to establish pcp.  Final Clinical Impressions(s) / ED Diagnoses   Final diagnoses:  Sickle cell pain crisis [facility])    ED Discharge Orders    None       Isla Pence, MD 03/05/18 [id number]

## 2018-03-05 NOTE — ED Triage Notes (Signed)
Per [facility] pt from hotel c/o generalized sickle cell pain that started last night. Vitals 108/60, 98% on room air, HR 70

## 2018-03-05 NOTE — ED Triage Notes (Signed)
Pt reports having pain in thighs related to sickle cell pain that started yesterday.

## 2018-03-05 NOTE — ED Notes (Signed)
Pt given two containers of juice, a cheese stick, and crackers with peanut butter

## 2018-03-05 NOTE — ED Triage Notes (Signed)
Pt constantly falling a sleep while being triaged and having to wake patient to answer questions.

## 2018-03-05 NOTE — ED Notes (Signed)
Dr. Gilford Raid informed that the patient was snoring the whole time writer was starting the patient's IV. Dr. Gilford Raid ordered to give just Toradol for pain.

## 2018-03-06 DIAGNOSIS — R51 Headache: Secondary | ICD-10-CM | POA: Diagnosis not present

## 2018-03-06 DIAGNOSIS — D72829 Elevated white blood cell count, unspecified: Secondary | ICD-10-CM

## 2018-03-06 DIAGNOSIS — F17210 Nicotine dependence, cigarettes, uncomplicated: Secondary | ICD-10-CM | POA: Diagnosis present

## 2018-03-06 DIAGNOSIS — D5700 Hb-SS disease with crisis, unspecified: Secondary | ICD-10-CM | POA: Diagnosis present

## 2018-03-06 DIAGNOSIS — F1210 Cannabis abuse, uncomplicated: Secondary | ICD-10-CM | POA: Diagnosis present

## 2018-03-06 DIAGNOSIS — F1910 Other psychoactive substance abuse, uncomplicated: Secondary | ICD-10-CM | POA: Diagnosis not present

## 2018-03-06 DIAGNOSIS — F1410 Cocaine abuse, uncomplicated: Secondary | ICD-10-CM | POA: Diagnosis present

## 2018-03-06 DIAGNOSIS — Z9081 Acquired absence of spleen: Secondary | ICD-10-CM | POA: Diagnosis not present

## 2018-03-06 DIAGNOSIS — Z832 Family history of diseases of the blood and blood-forming organs and certain disorders involving the immune mechanism: Secondary | ICD-10-CM | POA: Diagnosis not present

## 2018-03-06 LAB — BASIC METABOLIC PANEL
ANION GAP: 6 (ref 5–15)
BUN: 15 mg/dL (ref 6–20)
CO2: 23 mmol/L (ref 22–32)
Calcium: 8.7 mg/dL — ABNORMAL LOW (ref 8.9–10.3)
Chloride: 112 mmol/L — ABNORMAL HIGH (ref 101–111)
Creatinine, Ser: 0.87 mg/dL (ref 0.44–1.00)
GFR calc Af Amer: >60 mL/min (ref >60)
Glucose, Bld: 96 mg/dL (ref 65–99)
POTASSIUM: 4.5 mmol/L (ref 3.5–5.1)
SODIUM: 141 mmol/L (ref 135–145)

## 2018-03-06 LAB — CBC WITH DIFFERENTIAL/PLATELET
BASOS ABS: 0 10*3/uL (ref 0.0–0.1)
BASOS PCT: 0 %
Eosinophils Absolute: 0.1 10*3/uL (ref 0.0–0.7)
Eosinophils Relative: 1 %
HCT: 21.7 % — ABNORMAL LOW (ref 36.0–46.0)
HEMOGLOBIN: 7.6 g/dL — AB (ref 12.0–15.0)
Lymphocytes Relative: 47 %
Lymphs Abs: 6.9 10*3/uL (ref 0.7–4.0)
MCH: 34.4 pg — ABNORMAL HIGH (ref 26.0–34.0)
MCHC: 35 g/dL (ref 30.0–36.0)
MCV: 98.2 fL (ref 78.0–100.0)
Monocytes Absolute: 1.2 10*3/uL (ref 0.1–1.0)
Monocytes Relative: 8 %
NEUTROS ABS: 6.5 10*3/uL (ref 1.7–7.7)
NEUTROS PCT: 44 %
Platelets: 503 10*3/uL — ABNORMAL HIGH (ref 150–400)
RBC: 2.21 MIL/uL — AB (ref 3.87–5.11)
RDW: 20.8 % — ABNORMAL HIGH (ref 11.5–15.5)
WBC: 14.8 10*3/uL — ABNORMAL HIGH (ref 4.0–10.5)

## 2018-03-06 LAB — TYPE AND SCREEN
ABO/RH(D): B POS
Antibody Screen: NEGATIVE

## 2018-03-06 LAB — RAPID URINE DRUG SCREEN, HOSP PERFORMED
AMPHETAMINES: NOT DETECTED
BENZODIAZEPINES: NOT DETECTED
Barbiturates: NOT DETECTED
COCAINE: POSITIVE — AB
OPIATES: POSITIVE — AB
TETRAHYDROCANNABINOL: NOT DETECTED

## 2018-03-06 MED ORDER — ONDANSETRON HCL 4 MG/2ML IJ SOLN: 4.0000 mg | Freq: Four times a day (QID) | INTRAMUSCULAR | Status: DC | PRN

## 2018-03-06 MED ORDER — SODIUM CHLORIDE 0.45 % IV SOLN
INTRAVENOUS | Status: DC
  Administered 2018-03-06: 06:00:00 via INTRAVENOUS
  Administered 2018-03-06: 125 mL/h via INTRAVENOUS
  Administered 2018-03-06 – 2018-03-09 (×6): via INTRAVENOUS

## 2018-03-06 MED ORDER — POLYETHYLENE GLYCOL 3350 17 G PO PACK
17.0000 g | PACK | Freq: Every day | ORAL | Status: DC | PRN
  Administered 2018-03-09: 17 g via ORAL
  Filled 2018-03-06: qty 1

## 2018-03-06 MED ORDER — SODIUM CHLORIDE 0.9% FLUSH: 9.0000 mL | INTRAVENOUS | Status: DC | PRN

## 2018-03-06 MED ORDER — HYDROMORPHONE HCL 2 MG/ML IJ SOLN
2.0000 mg | Freq: Once | INTRAMUSCULAR | Status: AC
  Administered 2018-03-06: 2 mg via INTRAVENOUS
  Filled 2018-03-06: qty 1

## 2018-03-06 MED ORDER — PROMETHAZINE HCL 25 MG/ML IJ SOLN
25.0000 mg | Freq: Once | INTRAMUSCULAR | Status: AC
  Administered 2018-03-06: 25 mg via INTRAVENOUS
  Filled 2018-03-06: qty 1

## 2018-03-06 MED ORDER — KETOROLAC TROMETHAMINE 30 MG/ML IJ SOLN
30.0000 mg | Freq: Four times a day (QID) | INTRAMUSCULAR | Status: DC
  Administered 2018-03-06 – 2018-03-09 (×13): 30 mg via INTRAVENOUS
  Filled 2018-03-06 (×13): qty 1

## 2018-03-06 MED ORDER — DIPHENHYDRAMINE HCL 50 MG/ML IJ SOLN: 12.5000 mg | Freq: Four times a day (QID) | INTRAMUSCULAR | Status: DC | PRN

## 2018-03-06 MED ORDER — ENOXAPARIN SODIUM 40 MG/0.4ML ~~LOC~~ SOLN
40.0000 mg | SUBCUTANEOUS | Status: DC
  Administered 2018-03-06 – 2018-03-09 (×4): 40 mg via SUBCUTANEOUS
  Filled 2018-03-06 (×4): qty 0.4

## 2018-03-06 MED ORDER — DIPHENHYDRAMINE HCL 12.5 MG/5ML PO ELIX: 12.5000 mg | ORAL_SOLUTION | Freq: Four times a day (QID) | ORAL | Status: DC | PRN

## 2018-03-06 MED ORDER — NALOXONE HCL 0.4 MG/ML IJ SOLN: 0.4000 mg | INTRAMUSCULAR | Status: DC | PRN

## 2018-03-06 MED ORDER — HYDROMORPHONE 1 MG/ML IV SOLN
INTRAVENOUS | Status: DC
  Administered 2018-03-06: 06:00:00 via INTRAVENOUS
  Administered 2018-03-07: 2 mg via INTRAVENOUS
  Administered 2018-03-07: 0.2 mg via INTRAVENOUS
  Administered 2018-03-07: 0.8 mg via INTRAVENOUS
  Administered 2018-03-08: 2 mg via INTRAVENOUS
  Administered 2018-03-08: 2.4 mg via INTRAVENOUS
  Administered 2018-03-08: 0.6 mg via INTRAVENOUS
  Administered 2018-03-08: 25 mg via INTRAVENOUS
  Administered 2018-03-09: 2 mg via INTRAVENOUS
  Administered 2018-03-09: 0.8 mg via INTRAVENOUS
  Administered 2018-03-09: 2.4 mg via INTRAVENOUS
  Administered 2018-03-09: 3.4 mg via INTRAVENOUS
  Filled 2018-03-06 (×2): qty 25

## 2018-03-06 MED ORDER — HYDROMORPHONE 1 MG/ML IV SOLN: INTRAVENOUS | Status: DC

## 2018-03-06 MED ORDER — SENNOSIDES-DOCUSATE SODIUM 8.6-50 MG PO TABS
1.0000 | ORAL_TABLET | Freq: Two times a day (BID) | ORAL | Status: DC
  Administered 2018-03-06 – 2018-03-09 (×7): 1 via ORAL
  Filled 2018-03-06 (×7): qty 1

## 2018-03-06 NOTE — ED Provider Notes (Signed)
[facility] DEPT Provider Note   CSN: [id number] Arrival date & time: 03/05/18  February 11, 2048     History   Chief Complaint Chief Complaint  Patient presents with  . Sickle Cell Pain Crisis    HPI [name] is a 22 y.o. female.  The history is provided by the patient.  She has history of sickle cell disease, GERD, substance abuse and comes in with ongoing pain in her legs and arms.  Pain started yesterday.  She was taking her home pain medication without relief.  She came to the ED and received intravenous narcotics in the ED and was discharged, but states she was not feeling any better.  She vomited shortly after discharge and came back to the ED.  She currently rates pain at 7/10.  She denies fever or chills.  She denies rhinorrhea, sore throat, cough.  She denies dysuria.  Past Medical History:  Diagnosis Date  . Acute kidney injury ([place]) 05/15/2016  . Amenorrhea   . Anemia    SICKLE CELL  . Asthma   . Drug-induced pruritus 02/26/2015  . GERD (gastroesophageal reflux disease)   . Headache   . Hyperbilirubinemia 02/26/2015  . Sickle cell disease [facility])     Patient Active Problem List   Diagnosis Date Noted  . Sickle cell anemia with crisis ([place]) 12/16/2017  . Substance abuse ([place]) 12/16/2017  . Homelessness 02/12/2017  . Protein-calorie malnutrition, severe 10/21/2016  . Anemia due to other cause   . Hb-SS disease without crisis ([place])   . Hereditary hemolytic anemia ([place])   . 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 Tobacco abuse   . Leukocytosis   . Sickle cell pain crisis ([place]) 02/15/2013  . Abdominal pain 07/25/2012  . Asthma, mild intermittent 12/14/2010    Past Surgical History:  Procedure Laterality Date  . SPLENECTOMY     Age 44 for sequestration  . TONSILLECTOMY     Age 28     OB History   None      Home Medications    Prior to Admission medications   Medication Sig Start Date End Date  Taking? Authorizing Provider  ibuprofen (ADVIL,MOTRIN) 600 MG tablet Take 1 tablet (600 mg total) by mouth every 6 (six) hours as needed. Patient not taking: Reported on 11/20/2017 07/26/17   Domenic Moras, PA-C    Family History Family History  Problem Relation Age of Onset  . Hypertension Paternal Grandfather   . Sickle cell trait Father   . Cancer Mother        Died in 10-Feb-2009    Social History Social History   Tobacco Use  . Smoking status: Current Every Day Smoker    Packs/day: 0.50    Types: Cigarettes  . Smokeless tobacco: Never Used  Substance Use Topics  . Alcohol use: No    Alcohol/week: 3.6 oz    Types: 6 Shots of liquor per week    Comment: Denies 06/12/2014  . Drug use: Yes    Types: Marijuana, Cocaine     Allergies   Patient has no known allergies.   Review of Systems Review of Systems  All other systems reviewed and are negative.    Physical Exam Updated Vital Signs BP 109/60   Pulse 75   Temp 98 F (36.7 C) (Oral)   Resp 17   Ht 5\' 5"  (1.651 m)   Wt 56.7 kg (125 lb)   LMP 03/04/2018   SpO2 96%  BMI 20.80 kg/m   Physical Exam  Nursing note and vitals reviewed.  22 year old female, appears to be in pain, but his in no acute distress. Vital signs are normal. Oxygen saturation is 96%, which is normal. Head is normocephalic and atraumatic. PERRLA, EOMI. Oropharynx is clear. Neck is nontender and supple without adenopathy or JVD. Back is nontender and there is no CVA tenderness. Lungs are clear without rales, wheezes, or rhonchi. Chest is nontender. Heart has regular rate and rhythm without murmur. Abdomen is soft, flat, nontender without masses or hepatosplenomegaly and peristalsis is normoactive. Extremities have no cyanosis or edema, full range of motion is present. Skin is warm and dry without rash. Neurologic: Mental status is normal, cranial nerves are intact, there are no motor or sensory deficits.  ED Treatments / Results  Labs (all  labs ordered are listed, but only abnormal results are displayed) Labs Reviewed  COMPREHENSIVE METABOLIC PANEL - Abnormal; Notable for the following components:      Result Value   Glucose, Bld 117 (*)    ALT 12 (*)    Total Bilirubin 7.3 (*)    All other components within normal limits  CBC WITH DIFFERENTIAL/PLATELET - Abnormal; Notable for the following components:   WBC 14.8 (*)    RBC 2.13 (*)    Hemoglobin 7.4 (*)    HCT 21.2 (*)    MCH 34.7 (*)    RDW 20.7 (*)    Platelets 498 (*)    All other components within normal limits  RETICULOCYTES - Abnormal; Notable for the following components:   Retic Ct Pct 20.7 (*)    RBC. 2.13 (*)    Retic Count, Absolute 440.9 (*)    All other components within normal limits  I-STAT BETA HCG BLOOD, ED ([facility], [facility], [facility] ONLY)   Procedures Procedures   Medications Ordered in ED Medications  HYDROmorphone (DILAUDID) injection 2 mg (has no administration in time range)  promethazine (PHENERGAN) injection 25 mg (has no administration in time range)     Initial Impression / Assessment and Plan / ED Course  I have reviewed the triage vital signs and the nursing notes.  Pertinent lab results that were available during my care of the patient were reviewed by me and considered in my medical decision making (see chart for details).  Sickle cell crisis which has failed ED management.  Old records are reviewed confirming ED visit earlier today with aggressive use of narcotics.  She has numerous other ED visits and hospitalizations for sickle cell disease.  Hemoglobin is noted to have fallen by over 1g raising concern for possible hemolytic crisis.  She is given additional hydromorphone and will need to be admitted.  Case is discussed with Dr. Tamala Julian of [facility], who agrees to admit the patient.  Final Clinical Impressions(s) / ED Diagnoses   Final diagnoses:  Sickle cell pain crisis [facility])    ED Discharge Orders    None       Delora Fuel, MD 32/20/25 [phone]

## 2018-03-06 NOTE — ED Notes (Signed)
ED TO INPATIENT HANDOFF REPORT  Name/Age/Gender [name] 22 y.o. female  Code Status    Code Status Orders  (From admission, onward)        Start     Ordered   03/06/18 0413  Full code  Continuous     03/06/18 0415    Code Status History    Date Active Date Inactive Code Status Order ID Comments User Context   12/16/2017 0006 12/22/2017 2026 Full Code [id number]  Norval Morton, MD ED   04/19/2017 0046 04/20/2017 1713 Full Code [id number]  Toy Baker, MD Inpatient   02/26/2017 0558 02/27/2017 1555 Full Code [id number]  Rise Patience, MD Inpatient   02/12/2017 2249 02/14/2017 1947 Full Code [id number]  Karmen Bongo, MD Inpatient   12/22/2016 0002 12/23/2016 1449 Full Code [id number]  Reubin Milan, MD Inpatient   12/22/2016 0002 12/22/2016 0002 Full Code [id number]  Reubin Milan, MD Inpatient   10/20/2016 0105 10/21/2016 1552 Full Code [id number]  Toy Baker, MD ED   05/14/2016 2123 05/18/2016 1829 Full Code [id number]  Vivi Barrack, MD Inpatient   11/05/2015 0453 11/08/2015 1906 Full Code [id number]  Archie Patten, MD Inpatient   08/12/2015 0207 08/15/2015 1810 Full Code [id number]  Melancon, York Ram, MD Inpatient   03/10/2015 1545 03/11/2015 2051 Full Code [id number]  Olam Idler, MD Inpatient   03/09/2015 0650 03/10/2015 1206 Full Code [id number]  Melancon, York Ram, MD Inpatient   02/25/2015 0712 02/26/2015 1936 Full Code [id number]  Orvan Falconer, MD Inpatient   08/30/2014 0847 09/02/2014 2338 Full Code [id number]  Cordelia Poche, MD Inpatient   02/26/2014 0224 03/02/2014 1648 Full Code [id number]  Cordelia Poche, MD Inpatient   11/24/2013 0115 11/24/2013 2233 Full Code [id number]  Timmothy Euler, MD Inpatient   05/15/2013 0324 05/21/2013 1812 Full Code [id number]  Nolon Rod, DO Inpatient   05/15/2013 0221 05/15/2013 0324 Full Code [id number]  Nolon Rod, DO ED   03/08/2013 0902 03/08/2013 1838 Full Code [id number]  Coral Spikes, DO Inpatient   02/14/2013 0603  02/15/2013 1938 Full Code [id number]  Angelica Ran, MD ED   07/25/2012 0043 07/28/2012 1357 Full Code [id number]  Ayesha Rumpf, RN Inpatient   07/05/2012 0901 07/05/2012 1929 Full Code [id number]  Ma Hillock, DO Inpatient   09/20/2011 0329 09/21/2011 1041 Full Code [id number]  Altamese Dilling, RN Inpatient      Home/SNF/Other Home  Chief Complaint Sickle Cell Pain Crisis  Level of Care/Admitting Diagnosis ED Disposition    ED Disposition Condition [facility] Area: [place] [[id number]]  Level of Care: Med-Surg [16]  Diagnosis: Sickle cell pain crisis [facility]) [[id number]]  Admitting Physician: Norval Morton [[id number]]  Attending Physician: Norval Morton [[id number]]  Estimated length of stay: past midnight tomorrow  Certification:: I certify this patient will need inpatient services for at least 2 midnights  PT Class (Do Not Modify): Inpatient [101]  PT Acc Code (Do Not Modify): Private [1]       Medical History Past Medical History:  Diagnosis Date  . Acute kidney injury ([place]) 05/15/2016  . Amenorrhea   . Anemia    SICKLE CELL  . Asthma   . Drug-induced pruritus 02/26/2015  . GERD (gastroesophageal reflux disease)   . Headache   . Hyperbilirubinemia 02/26/2015  . Sickle cell disease ([place])     Allergies No Known Allergies  IV Location/Drains/Wounds Patient Lines/Drains/Airways Status  Active Line/Drains/Airways    Name:   Placement date:   Placement time:   Site:   Days:   Peripheral IV 03/06/18 Left;Posterior Forearm   03/06/18    0231    Forearm   less than 1          Labs/Imaging Results for orders placed or performed during the hospital encounter of 03/05/18 (from the past 48 hour(s))  Comprehensive metabolic panel     Status: Abnormal   Collection Time: 03/05/18 10:58 PM  Result Value Ref Range   Sodium 142 135 - 145 mmol/L   Potassium 3.9 3.5 - 5.1 mmol/L   Chloride 108 101 - 111 mmol/L   CO2 25 22 - 32 mmol/L    Glucose, Bld 117 (H) 65 - 99 mg/dL   BUN 16 6 - 20 mg/dL   Creatinine, Ser 0.89 0.44 - 1.00 mg/dL   Calcium 8.9 8.9 - 10.3 mg/dL   Total Protein 6.9 6.5 - 8.1 g/dL   Albumin 3.8 3.5 - 5.0 g/dL   AST 23 15 - 41 U/L   ALT 12 (L) 14 - 54 U/L   Alkaline Phosphatase 60 38 - 126 U/L   Total Bilirubin 7.3 (H) 0.3 - 1.2 mg/dL   GFR calc non Af Amer >60 >60 mL/min   GFR calc Af Amer >60 >60 mL/min    Comment: (NOTE) The eGFR has been calculated using the CKD EPI equation. This calculation has not been validated in all clinical situations. eGFR's persistently <60 mL/min signify possible Chronic Kidney Disease.    Anion gap 9 5 - 15    Comment: Performed at [facility], [place] [address], [place], [place] [address]  CBC with Differential     Status: Abnormal   Collection Time: 03/05/18 10:58 PM  Result Value Ref Range   WBC 14.8 (H) 4.0 - 10.5 K/uL   RBC 2.13 (L) 3.87 - 5.11 MIL/uL   Hemoglobin 7.4 (L) 12.0 - 15.0 g/dL   HCT 21.2 (L) 36.0 - 46.0 %   MCV 99.5 78.0 - 100.0 fL   MCH 34.7 (H) 26.0 - 34.0 pg   MCHC 34.9 30.0 - 36.0 g/dL   RDW 20.7 (H) 11.5 - 15.5 %   Platelets 498 (H) 150 - 400 K/uL   Neutrophils Relative % 53 %   Neutro Abs 7.8 1.7 - 7.7 K/uL   Lymphocytes Relative 36 %   Lymphs Abs 5.3 0.7 - 4.0 K/uL   Monocytes Relative 10 %   Monocytes Absolute 1.5 0.1 - 1.0 K/uL   Eosinophils Relative 1 %   Eosinophils Absolute 0.1 0.0 - 0.7 K/uL   Basophils Relative 0 %   Basophils Absolute 0.0 0.0 - 0.1 K/uL   RBC Morphology RARE NRBCs     Comment: Sickle cells present TARGET CELLS HOWELL/JOLLY BODIES POLYCHROMASIA PRESENT Performed at [facility], [place] [address], [place], [place] [address]   Reticulocytes     Status: Abnormal   Collection Time: 03/05/18 10:58 PM  Result Value Ref Range   Retic Ct Pct 20.7 (H) 0.4 - 3.1 %   RBC. 2.13 (L) 3.87 - 5.11 MIL/uL   Retic Count, Absolute 440.9 (H) 19.0 - 186.0 K/uL    Comment: Performed at  [facility], [place] [address] [place], [place] [address]  I-Stat beta hCG blood, ED     Status: None   Collection Time: 03/05/18 11:13 PM  Result Value Ref Range   I-stat hCG,  quantitative <5.0 <5 mIU/mL   Comment 3            Comment:   GEST. AGE      CONC.  (mIU/mL)   <=1 WEEK        5 - 50     2 WEEKS       50 - 500     3 WEEKS       100 - 10,000     4 WEEKS     1,000 - 30,000        FEMALE AND NON-PREGNANT FEMALE:     LESS THAN 5 mIU/mL    No results found.  Pending Labs Unresulted Labs (From admission, onward)   Start     Ordered   03/06/18 0500  CBC with Differential/Platelet  Tomorrow morning,   R     03/06/18 0415   03/06/18 7972  Basic metabolic panel  Tomorrow morning,   R     03/06/18 0443   03/06/18 0442  Urine rapid drug screen (hosp performed)  STAT,   R     03/06/18 0441      Vitals/Pain Today's Vitals   03/06/18 0130 03/06/18 0200 03/06/18 0246 03/06/18 0351  BP: 105/62 (!) 104/58 109/67 (!) 109/55  Pulse: 65 65 60 63  Resp: _0 Temp:      TempSrc:      SpO2: 97% 97% 95% 96%  Weight:      Height:      PainSc:        Isolation Precautions No active isolations  Medications Medications  senna-docusate (Senokot-S) tablet 1 tablet (has no administration in time range)  polyethylene glycol (MIRALAX / GLYCOLAX) packet 17 g (has no administration in time range)  enoxaparin (LOVENOX) injection 40 mg (has no administration in time range)  ketorolac (TORADOL) 30 MG/ML injection 30 mg (has no administration in time range)  0.45 % sodium chloride infusion (has no administration in time range)  HYDROmorphone (DILAUDID) 1 mg/mL PCA injection (has no administration in time range)  HYDROmorphone (DILAUDID) injection 2 mg (2 mg Intravenous Given 03/06/18 0232)  promethazine (PHENERGAN) injection 25 mg (25 mg Intravenous Given 03/06/18 0233)    Mobility walks

## 2018-03-06 NOTE — Progress Notes (Signed)
Paged Dr.Garba, patient yelling at staff stating if she does not get to eat, she will leave AMA. Regular diet ordered.

## 2018-03-06 NOTE — H&P (Signed)
History and Physical    [name] OTL:[id number] DOB: 1996/02/26 DOA: 03/05/2018  Referring MD/NP/PA: Dr. Delora Fuel PCP: Patient, No Pcp Per  Patient coming from: home  Chief Complaint: Pain in legs  I have personally briefly reviewed patient's old medical records in [place]   HPI: [name] is a 22 y.o. female with medical history significant of sickle cell disease and polysubstance abuse; who presents with complaints of pain in her legs.  Symptoms feel similar to previous of sickle cell pain crises.  Patient is not on any narcotic pain medications at home.  Associated symptoms include complaints of these one episode of nausea and vomiting.  Denies any recent trauma/falls, joint swelling, fever, chills, chest pain, shortness of breath, abdominal pain, diarrhea, or dysuria symptoms.  Patient had been seen earlier in the day at the emergency department with the same symptoms.  ED Course: Upon admission into the emergency department patient was noted to be afebrile, pulse 56-78, respirations 14-22, blood pressure 99/63 - 111/74, O2 saturation maintained on room air.  Labs reveal WBC 14.8, hemoglobin 7.4( previously 8.6).  Patient was given 1 mg of Dilaudid and [facility] called to admit.  Review of Systems  Constitutional: Negative for chills, fever and weight loss.  HENT: Negative for congestion and nosebleeds.   Eyes: Negative for double vision and photophobia.  Respiratory: Negative for cough and shortness of breath.   Cardiovascular: Negative for chest pain and orthopnea.  Gastrointestinal: Positive for nausea and vomiting. Negative for abdominal pain and blood in stool.  Genitourinary: Negative for dysuria and hematuria.  Musculoskeletal: Positive for myalgias. Negative for falls.  Skin: Positive for itching.  Neurological: Negative for sensory change and focal weakness.  Endo/Heme/Allergies: Negative for polydipsia. Does not bruise/bleed easily.    Psychiatric/Behavioral: Negative for depression and suicidal ideas.    Past Medical History:  Diagnosis Date  . Acute kidney injury ([place]) 05/15/2016  . Amenorrhea   . Anemia    SICKLE CELL  . Asthma   . Drug-induced pruritus 02/26/2015  . GERD (gastroesophageal reflux disease)   . Headache   . Hyperbilirubinemia 02/26/2015  . Sickle cell disease ([place])     Past Surgical History:  Procedure Laterality Date  . 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 TONSILLECTOMY     Age 81     reports that she has been smoking cigarettes.  She has been smoking about 0.50 packs per day. She has never used smokeless tobacco. She reports that she has current or past drug history. Drugs: Marijuana and Cocaine. She reports that she does not drink alcohol.  No Known Allergies  Family History  Problem Relation Age of Onset  . Hypertension Paternal Grandfather   . Sickle cell trait Father   . Cancer Mother        Died in February 23, 2009    Prior to Admission medications   Medication Sig Start Date End Date Taking? Authorizing Provider  ibuprofen (ADVIL,MOTRIN) 600 MG tablet Take 1 tablet (600 mg total) by mouth every 6 (six) hours as needed. Patient not taking: Reported on 11/20/2017 07/26/17   Domenic Moras, PA-C    Physical Exam:  Constitutional: NAD, calm, comfortable Vitals:   03/06/18 0130 03/06/18 0200 03/06/18 0246 03/06/18 0351  BP: 105/62 (!) 104/58 109/67 (!) 109/55  Pulse: 65 65 60 63  Resp: 18 18 18 16   Temp:      TempSrc:      SpO2: 97% 97%  95% 96%  Weight:      Height:       Eyes: PERRL, lids and conjunctivae normal ENMT: Mucous membranes are dry. Posterior pharynx clear of any exudate or lesions. Neck: normal, supple, no masses, no thyromegaly Respiratory: clear to auscultation bilaterally, no wheezing, no crackles. Normal respiratory effort. No accessory muscle use.  Cardiovascular: Regular rate and rhythm, no murmurs / rubs / gallops. No extremity edema. 2+ pedal pulses. No  carotid bruits.  Abdomen: no tenderness, no masses palpated. No hepatosplenomegaly. Bowel sounds positive.  Musculoskeletal: no clubbing / cyanosis. No joint deformity upper and lower extremities. Good ROM, no contractures. Normal muscle tone.  Skin: no rashes, lesions, ulcers. No induration Neurologic: CN 2-12 grossly intact. Sensation intact, DTR normal. Strength 5/5 in all 4.  Psychiatric: Normal judgment and insight.  Lethargic, but oriented x 3. Normal mood.     Labs on Admission: I have personally reviewed following labs and imaging studies  CBC: Recent Labs  Lab 03/05/18 1335 03/05/18 2258  WBC 15.2* 14.8*  NEUTROABS 9.3* 7.8  HGB 8.6* 7.4*  HCT 24.3* 21.2*  MCV 99.2 99.5  PLT 580* 710*   Basic Metabolic Panel: Recent Labs  Lab 03/05/18 1335 03/05/18 2258  NA 142 142  K 3.5 3.9  CL 109 108  CO2 24 25  GLUCOSE 83 117*  BUN 9 16  CREATININE 0.84 0.89  CALCIUM 9.1 8.9   GFR: Estimated Creatinine Clearance: 88 mL/min (by C-G formula based on SCr of 0.89 mg/dL). Liver Function Tests: Recent Labs  Lab 03/05/18 1335 03/05/18 2258  AST 22 23  ALT 13* 12*  ALKPHOS 59 60  BILITOT 7.1* 7.3*  PROT 7.8 6.9  ALBUMIN 4.4 3.8   No results for input(s): LIPASE, AMYLASE in the last 168 hours. No results for input(s): AMMONIA in the last 168 hours. Coagulation Profile: No results for input(s): INR, PROTIME in the last 168 hours. Cardiac Enzymes: No results for input(s): CKTOTAL, CKMB, CKMBINDEX, TROPONINI in the last 168 hours. BNP (last 3 results) No results for input(s): PROBNP in the last 8760 hours. HbA1C: No results for input(s): HGBA1C in the last 72 hours. CBG: No results for input(s): GLUCAP in the last 168 hours. Lipid Profile: No results for input(s): CHOL, HDL, LDLCALC, TRIG, CHOLHDL, LDLDIRECT in the last 72 hours. Thyroid Function Tests: No results for input(s): TSH, T4TOTAL, FREET4, T3FREE, THYROIDAB in the last 72 hours. Anemia Panel: Recent Labs     03/05/18 1335 03/05/18 2258  RETICCTPCT 20.8* 20.7*   Urine analysis:    Component Value Date/Time   COLORURINE YELLOW 12/17/2017 1530   APPEARANCEUR CLEAR 12/17/2017 1530   LABSPEC 1.009 12/17/2017 1530   PHURINE 6.0 12/17/2017 1530   GLUCOSEU NEGATIVE 12/17/2017 1530   HGBUR NEGATIVE 12/17/2017 1530   BILIRUBINUR NEGATIVE 12/17/2017 1530   KETONESUR NEGATIVE 12/17/2017 1530   PROTEINUR NEGATIVE 12/17/2017 1530   UROBILINOGEN 1.0 08/11/2015 1948   NITRITE NEGATIVE 12/17/2017 1530   LEUKOCYTESUR NEGATIVE 12/17/2017 1530   Sepsis Labs: No results found for this or any previous visit (from the past 240 hour(s)).   Radiological Exams on Admission: No results found.    Assessment/Plan Sickle cell anemia with pain crisis: Acute.  Patient presents with complaints of pain in legs and arms.  Hemoglobin on admission noted to be 7.4 which is acute drop from previous hemoglobin of 8.6 earlier yesterday. - Sickle cell admission order set  initiated - 1/2 normal saline at 125 ml/hr - Benadryl IV  prn itching - Antiemetics as needed - Toradol IV - Opioid nave reduced Dilaudid PCA pump order set initiated - Type and screen for possible need of blood  Leukocytosis: Chronic.  WBC elevated at 14.8 patient does not report any infectious symptoms. - Recheck CBC in a.m.  History of polysubstance abuse: Patient with previous history of cocaine abuse thought to be a possible trigger for sickle cell pain crises. - Check urine drug screen   DVT prophylaxis: lovenox Code Status: Full Family Communication: No family present at bedside Disposition Plan: TBD  Consults called: none  Admission status:Inpatient  Norval Morton MD [facility] Pager [phone]   If 7PM-7AM, please contact night-coverage [email or web address] Password [email or web address]  03/06/2018, 4:12 AM

## 2018-03-06 NOTE — Progress Notes (Signed)
Volume infused on Dilaudid PCA shows 0.5mg , patient lethargic and unable to stay awake. Patient took senokot po but had to be awaken four times with cup in hand. Unable to answer admission questions due to falling asleep.

## 2018-03-07 LAB — CBC WITH DIFFERENTIAL/PLATELET
BASOS ABS: 0 10*3/uL (ref 0.0–0.1)
Basophils Relative: 0 %
Eosinophils Absolute: 0.1 10*3/uL (ref 0.0–0.7)
Eosinophils Relative: 1 %
HEMATOCRIT: 18.3 % — AB (ref 36.0–46.0)
Hemoglobin: 6.9 g/dL — CL (ref 12.0–15.0)
LYMPHS ABS: 5.2 10*3/uL (ref 0.7–4.0)
LYMPHS PCT: 38 %
MCH: 36.1 pg — AB (ref 26.0–34.0)
MCHC: 37.7 g/dL — ABNORMAL HIGH (ref 30.0–36.0)
MCV: 95.8 fL (ref 78.0–100.0)
MONOS PCT: 7 %
Monocytes Absolute: 1 10*3/uL (ref 0.1–1.0)
NEUTROS ABS: 7.5 10*3/uL (ref 1.7–7.7)
NRBC: 6 /100{WBCs} — AB
Neutrophils Relative %: 54 %
Platelets: 484 10*3/uL — ABNORMAL HIGH (ref 150–400)
RBC: 1.91 MIL/uL — ABNORMAL LOW (ref 3.87–5.11)
RDW: 21.1 % — ABNORMAL HIGH (ref 11.5–15.5)
WBC: 13.8 10*3/uL — ABNORMAL HIGH (ref 4.0–10.5)

## 2018-03-07 LAB — COMPREHENSIVE METABOLIC PANEL
ALK PHOS: 54 U/L (ref 38–126)
ALT: 36 U/L (ref 14–54)
AST: 60 U/L — AB (ref 15–41)
Albumin: 3.3 g/dL — ABNORMAL LOW (ref 3.5–5.0)
Anion gap: 6 (ref 5–15)
BILIRUBIN TOTAL: 5.9 mg/dL — AB (ref 0.3–1.2)
BUN: 13 mg/dL (ref 6–20)
CALCIUM: 8.2 mg/dL — AB (ref 8.9–10.3)
CO2: 21 mmol/L — ABNORMAL LOW (ref 22–32)
CREATININE: 0.8 mg/dL (ref 0.44–1.00)
Chloride: 112 mmol/L — ABNORMAL HIGH (ref 101–111)
Glucose, Bld: 96 mg/dL (ref 65–99)
Potassium: 4.2 mmol/L (ref 3.5–5.1)
Sodium: 139 mmol/L (ref 135–145)
TOTAL PROTEIN: 6 g/dL — AB (ref 6.5–8.1)

## 2018-03-07 NOTE — Progress Notes (Signed)
CRITICAL VALUE ALERT  Critical Value:  Hgb 6.9  Date & Time Notied:  [id number]  [id number]  Provider Notified: Jonelle Sidle  Orders Received/Actions

## 2018-03-07 NOTE — Progress Notes (Signed)
Subjective: [name] with history of sickle cell disease and cocaine abuse admitted yesterday with sickle cell painful crisis.  Patient has not been using her PCA and has been drowsy for the most part.  Pain is down to 6/10.  She is on ibuprofen only at home.  No fever or chills. Hemoglobin has dropped from 7.6-6.9 today.  Objective: Vital signs in last 24 hours: Temp:  [98 F (36.7 C)-98.9 F (37.2 C)] 98 F (36.7 C) (05/09 1334) Pulse Rate:  [58-93] 74 (05/09 1334) Resp:  [13-19] 16 (05/09 1621) BP: (90-117)/(58-104) 108/62 (05/09 1334) SpO2:  [91 %-98 %] 98 % (05/09 1621) Weight change:  Last BM Date: 03/05/18  Intake/Output from previous day: 05/08 0701 - 05/09 0700 In: 3711.7 [P.O.:720; I.V.:2991.7] Out: -  Intake/Output this shift: Total I/O In: 3025 [P.O.:1650; I.V.:1375] Out: 1400 [Urine:1400]  General appearance: alert, cooperative, appears stated age and no distress Back: symmetric, no curvature. ROM normal. No CVA tenderness. Resp: clear to auscultation bilaterally Cardio: regular rate and rhythm, S1, S2 normal, no murmur, click, rub or gallop GI: soft, non-tender; bowel sounds normal; no masses,  no organomegaly Extremities: extremities normal, atraumatic, no cyanosis or edema Pulses: 2+ and symmetric Neurologic: Grossly normal  Lab Results: Recent Labs    03/06/18 0554 03/07/18 0730  WBC 14.8* 13.8*  HGB 7.6* 6.9*  HCT 21.7* 18.3*  PLT 503* 484*   BMET Recent Labs    03/06/18 0554 03/07/18 0730  NA 141 139  K 4.5 4.2  CL 112* 112*  CO2 23 21*  GLUCOSE 96 96  BUN 15 13  CREATININE 0.87 0.80  CALCIUM 8.7* 8.2*    Studies/Results: No results found.  Medications: I have reviewed the patient's current medications.  Assessment/Plan: [name] admitted with sickle cell painful crisis.  #1 sickle cell painful crisis: Patient will be maintained on current regimen. Mobilize patient on plan possible discharge tomorrow.  #2  polysubstance abuse: This includes cocaine.  Counseling provided.  #3 leukocytosis, white count of 14,000.  Secondary to sickle cell crisis.  Overall this is improving.  #4 sickle cell anemia: Hemoglobin has dropped but probably due to hemodilution.  Continue close monitoring  LOS: 1 day   Gerarda Conklin,LAWAL 03/07/2018, 5:40 PM

## 2018-03-08 LAB — CBC WITH DIFFERENTIAL/PLATELET
Basophils Absolute: 0 10*3/uL (ref 0.0–0.1)
Basophils Relative: 0 %
EOS ABS: 0.2 10*3/uL (ref 0.0–0.7)
EOS PCT: 1 %
HEMATOCRIT: 19 % — AB (ref 36.0–46.0)
HEMOGLOBIN: 6.7 g/dL — AB (ref 12.0–15.0)
LYMPHS ABS: 6 10*3/uL (ref 0.7–4.0)
LYMPHS PCT: 39 %
MCH: 34.5 pg — ABNORMAL HIGH (ref 26.0–34.0)
MCHC: 35.3 g/dL (ref 30.0–36.0)
MCV: 97.9 fL (ref 78.0–100.0)
MONOS PCT: 8 %
Monocytes Absolute: 1.2 10*3/uL (ref 0.1–1.0)
Neutro Abs: 8.1 10*3/uL (ref 1.7–7.7)
Neutrophils Relative %: 52 %
Platelets: 470 10*3/uL — ABNORMAL HIGH (ref 150–400)
RBC: 1.94 MIL/uL — ABNORMAL LOW (ref 3.87–5.11)
RDW: 22.2 % — ABNORMAL HIGH (ref 11.5–15.5)
WBC: 15.5 10*3/uL — ABNORMAL HIGH (ref 4.0–10.5)
nRBC: 4 /100 WBC — ABNORMAL HIGH

## 2018-03-08 LAB — COMPREHENSIVE METABOLIC PANEL
ALBUMIN: 3.3 g/dL — AB (ref 3.5–5.0)
ALK PHOS: 51 U/L (ref 38–126)
ALT: 52 U/L (ref 14–54)
AST: 54 U/L — AB (ref 15–41)
Anion gap: 7 (ref 5–15)
BILIRUBIN TOTAL: 4.1 mg/dL — AB (ref 0.3–1.2)
BUN: 17 mg/dL (ref 6–20)
CALCIUM: 8.5 mg/dL — AB (ref 8.9–10.3)
CO2: 23 mmol/L (ref 22–32)
Chloride: 109 mmol/L (ref 101–111)
Creatinine, Ser: 0.92 mg/dL (ref 0.44–1.00)
GFR calc Af Amer: >60 mL/min (ref >60)
Glucose, Bld: 98 mg/dL (ref 65–99)
POTASSIUM: 4.3 mmol/L (ref 3.5–5.1)
Sodium: 139 mmol/L (ref 135–145)
TOTAL PROTEIN: 6.3 g/dL — AB (ref 6.5–8.1)

## 2018-03-08 MED ORDER — ONDANSETRON HCL 4 MG PO TABS: 4.0000 mg | ORAL_TABLET | Freq: Four times a day (QID) | ORAL | Status: DC | PRN

## 2018-03-08 MED ORDER — ACETAMINOPHEN 325 MG PO TABS: 650.0000 mg | ORAL_TABLET | Freq: Four times a day (QID) | ORAL | Status: DC | PRN

## 2018-03-08 NOTE — Progress Notes (Signed)
Subjective: Patient doing better but not taking her PCA as scheduled. Only used 28 mg in 24 hrs. More awake pain is now down to 6/10.  No nausea vomiting or diarrhea.  Objective: Vital signs in last 24 hours: Temp:  [97.9 F (36.6 C)-98.4 F (36.9 C)] 98.2 F (36.8 C) (05/10 1413) Pulse Rate:  [58-75] 60 (05/10 1413) Resp:  [11-22] 22 (05/10 1600) BP: (106-119)/(49-61) 113/61 (05/10 1413) SpO2:  [95 %-100 %] 98 % (05/10 1600) Weight change:  Last BM Date: 03/05/18  Intake/Output from previous day: 05/09 0701 - 05/10 0700 In: 5130 [P.O.:2130; I.V.:3000] Out: 2200 [Urine:2200] Intake/Output this shift: Total I/O In: 1840 [P.O.:840; I.V.:1000] Out: 2900 [Urine:2900]  General appearance: alert, cooperative, appears stated age and no distress Back: symmetric, no curvature. ROM normal. No CVA tenderness. Resp: clear to auscultation bilaterally Cardio: regular rate and rhythm, S1, S2 normal, no murmur, click, rub or gallop GI: soft, non-tender; bowel sounds normal; no masses,  no organomegaly Extremities: extremities normal, atraumatic, no cyanosis or edema Pulses: 2+ and symmetric Neurologic: Grossly normal  Lab Results: Recent Labs    03/07/18 0730 03/08/18 0536  WBC 13.8* 15.5*  HGB 6.9* 6.7*  HCT 18.3* 19.0*  PLT 484* 470*   BMET Recent Labs    03/07/18 0730 03/08/18 0536  NA 139 139  K 4.2 4.3  CL 112* 109  CO2 21* 23  GLUCOSE 96 98  BUN 13 17  CREATININE 0.80 0.92  CALCIUM 8.2* 8.5*    Studies/Results: No results found.  Medications: I have reviewed the patient's current medications.  Assessment/Plan: [name] admitted with sickle cell painful crisis.  #1 sickle cell painful crisis: Keep patient on current regimen. Mobilize patient on plan possible discharge tomorrow.  #2 polysubstance abuse: This includes cocaine.  Counseling provided.  #3 leukocytosis, white count of 15,000.  Secondary to sickle cell crisis.  Patient appears to be in  crisis still.  #4 sickle cell anemia: Hemoglobin has dropped but probably due to hemodilution.  Continue close monitoring  #5 headache: Patient is cold and headache.  Initiate Tylenol.  Also nausea we'll give Zofran   LOS: 2 days   [name],LAWAL 03/08/2018, 5:35 PM

## 2018-03-09 MED ORDER — FLEET ENEMA 7-19 GM/118ML RE ENEM: 1.0000 | ENEMA | Freq: Once | RECTAL | Status: DC

## 2018-03-09 NOTE — Progress Notes (Signed)
Patient asking to be discharged.   Paged Dr. Jonelle Sidle who said the patient was not yet ready for discharge and that he would evaluate her stability again in the AM. Patient then asked for [place] papers. Patient understands all risks associated with discharge, including insurance possibly not paying for hospital stay. Patient still wanted to proceed with [facility] discharge.   AMA papers signed.   PCA discontinued, IV removed, and patient left the unit.   Wasted remaining Dilaudid PCA with Wendie Chess, Therapist, sports.   Dr. Jonelle Sidle aware of situation.

## 2018-03-17 NOTE — Discharge Summary (Signed)
DISCHARGE SUMMARY  [name]  MR#: [id number]  DOB:08-04-1996  Date of Admission: 03/05/2018 Date of Discharge: 03/09/2018  Attending Physician:Anaise Sterbenz,LAWAL  Patient's JHE:RDEYCXK, No Pcp Per  Consults: none  Discharge Diagnoses: Present on Admission: . Sickle cell anemia with crisis ([place]) . Leukocytosis . Substance abuse ([place])     Allergies as of 03/09/2018   No Known Allergies     Medication List    ASK your doctor about these medications   ibuprofen 600 MG tablet Commonly known as:  ADVIL,MOTRIN Take 1 tablet (600 mg total) by mouth every 6 (six) hours as needed.         Hospital Course: Present on Admission: . Sickle cell anemia with crisis ([place]) . Leukocytosis . Substance abuse ([place]) :patient was admitted with sickle cell painful crisis and known history of substance abuse. She was being treated with Dilaudid PCA and Toradol. Not using the medications adequately. She abuses cocaine and even on the day of admission was using cocaine. Patient was initially obtunded but improved in the hospital. Once she was stable she decided to leave [place]. Despite efforts to convince patient to stay she left the hospital [place].   Day of Discharge BP (!) 109/58 (BP Location: Right Arm)   Pulse 71   Temp 98.5 F (36.9 C) (Oral)   Resp 17   Ht 5\' 5"  (1.651 m)   Wt 56.7 kg (125 lb)   LMP 03/04/2018   SpO2 94%   BMI 20.80 kg/m   Physical Exam: Generally: good air entry bilaterally, no wheeze or rales Respiratory: Good air entry bilaterally with no wheeze rales or crackles   No results found for this or any previous visit (from the past 24 hour(s)).  Disposition: Patient left AGAINST MEDICAL ADVICE   Follow-up Appts:   Follow-up with Dr.no follow-up set as she left AGAINST MEDICAL ADVICE,  Tests Needing Follow-up: Non planned Signed: Antonia Culbertson,LAWAL 03/17/2018, 12:49 AM

## 2018-04-18 ENCOUNTER — Emergency Department (HOSPITAL_COMMUNITY)
Admission: EM | Admit: 2018-04-18 | Discharge: 2018-04-18 | Disposition: A | Discharge status: Home / Self Care | Payer: Medicare Other | Attending: Emergency Medicine | Admitting: Emergency Medicine

## 2018-04-18 ENCOUNTER — Emergency Department (HOSPITAL_COMMUNITY): Payer: Medicare Other

## 2018-04-18 ENCOUNTER — Encounter (HOSPITAL_COMMUNITY): Payer: Self-pay | Admitting: Emergency Medicine

## 2018-04-18 ENCOUNTER — Other Ambulatory Visit: Payer: Self-pay

## 2018-04-18 DIAGNOSIS — S8001XA Contusion of right knee, initial encounter: Secondary | ICD-10-CM | POA: Diagnosis not present

## 2018-04-18 DIAGNOSIS — Y999 Unspecified external cause status: Secondary | ICD-10-CM | POA: Insufficient documentation

## 2018-04-18 DIAGNOSIS — Y9389 Activity, other specified: Secondary | ICD-10-CM | POA: Insufficient documentation

## 2018-04-18 DIAGNOSIS — F17210 Nicotine dependence, cigarettes, uncomplicated: Secondary | ICD-10-CM | POA: Insufficient documentation

## 2018-04-18 DIAGNOSIS — S8991XA Unspecified injury of right lower leg, initial encounter: Secondary | ICD-10-CM | POA: Diagnosis present

## 2018-04-18 DIAGNOSIS — Y929 Unspecified place or not applicable: Secondary | ICD-10-CM | POA: Insufficient documentation

## 2018-04-18 MED ORDER — ACETAMINOPHEN 500 MG PO TABS
1000.0000 mg | ORAL_TABLET | Freq: Once | ORAL | Status: AC
  Administered 2018-04-18: 1000 mg via ORAL
  Filled 2018-04-18: qty 2

## 2018-04-18 NOTE — Discharge Instructions (Addendum)
Your vital signs are within normal limits.  The x-ray of your knee is negative for fracture or dislocation or effusion.  Please use the knee sleeve over the next 5 to 7 days.  Use Tylenol extra strength for discomfort.  Please see your primary physician or return to the emergency department if any changes in your condition, problems, or concerns.
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
## 2018-04-18 NOTE — ED Notes (Signed)
Bed: WTR8 Expected date:  Expected time:  Means of arrival:  Comments: EMS-knee pin
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
## 2018-05-16 ENCOUNTER — Encounter (HOSPITAL_COMMUNITY): Payer: Self-pay

## 2018-05-16 ENCOUNTER — Other Ambulatory Visit: Payer: Self-pay

## 2018-05-16 ENCOUNTER — Non-Acute Institutional Stay (HOSPITAL_COMMUNITY): Admission: AD | Admit: 2018-05-16 | Payer: Medicare Other | Source: Ambulatory Visit | Admitting: Internal Medicine

## 2018-05-16 ENCOUNTER — Inpatient Hospital Stay (HOSPITAL_COMMUNITY)
Admission: EM | Admit: 2018-05-16 | Discharge: 2018-05-20 | DRG: 812 | Disposition: A | Discharge status: Home / Self Care | Payer: Medicare Other | Attending: Internal Medicine | Admitting: Internal Medicine

## 2018-05-16 DIAGNOSIS — K219 Gastro-esophageal reflux disease without esophagitis: Secondary | ICD-10-CM | POA: Diagnosis present

## 2018-05-16 DIAGNOSIS — F17210 Nicotine dependence, cigarettes, uncomplicated: Secondary | ICD-10-CM | POA: Diagnosis present

## 2018-05-16 DIAGNOSIS — Z720 Tobacco use: Secondary | ICD-10-CM | POA: Diagnosis not present

## 2018-05-16 DIAGNOSIS — R748 Abnormal levels of other serum enzymes: Secondary | ICD-10-CM | POA: Diagnosis present

## 2018-05-16 DIAGNOSIS — D5700 Hb-SS disease with crisis, unspecified: Secondary | ICD-10-CM | POA: Diagnosis present

## 2018-05-16 DIAGNOSIS — Z8249 Family history of ischemic heart disease and other diseases of the circulatory system: Secondary | ICD-10-CM | POA: Diagnosis not present

## 2018-05-16 DIAGNOSIS — F1410 Cocaine abuse, uncomplicated: Secondary | ICD-10-CM | POA: Diagnosis present

## 2018-05-16 DIAGNOSIS — J4520 Mild intermittent asthma, uncomplicated: Secondary | ICD-10-CM | POA: Diagnosis not present

## 2018-05-16 DIAGNOSIS — Z9114 Patient's other noncompliance with medication regimen: Secondary | ICD-10-CM | POA: Diagnosis not present

## 2018-05-16 DIAGNOSIS — Z832 Family history of diseases of the blood and blood-forming organs and certain disorders involving the immune mechanism: Secondary | ICD-10-CM

## 2018-05-16 DIAGNOSIS — F1910 Other psychoactive substance abuse, uncomplicated: Secondary | ICD-10-CM | POA: Diagnosis present

## 2018-05-16 DIAGNOSIS — D571 Sickle-cell disease without crisis: Secondary | ICD-10-CM | POA: Diagnosis present

## 2018-05-16 DIAGNOSIS — Z9081 Acquired absence of spleen: Secondary | ICD-10-CM

## 2018-05-16 DIAGNOSIS — J45909 Unspecified asthma, uncomplicated: Secondary | ICD-10-CM | POA: Diagnosis present

## 2018-05-16 DIAGNOSIS — D72829 Elevated white blood cell count, unspecified: Secondary | ICD-10-CM | POA: Diagnosis present

## 2018-05-16 LAB — POTASSIUM: POTASSIUM: 4.6 mmol/L (ref 3.5–5.1)

## 2018-05-16 LAB — CBC WITH DIFFERENTIAL/PLATELET
BASOS ABS: 0 10*3/uL (ref 0.0–0.1)
Basophils Relative: 0 %
EOS ABS: 0.2 10*3/uL (ref 0.0–0.7)
Eosinophils Relative: 1 %
HCT: 22.6 % — ABNORMAL LOW (ref 36.0–46.0)
HEMOGLOBIN: 8.2 g/dL — AB (ref 12.0–15.0)
LYMPHS PCT: 33 %
Lymphs Abs: 7.1 10*3/uL — ABNORMAL HIGH (ref 0.7–4.0)
MCH: 36.8 pg — ABNORMAL HIGH (ref 26.0–34.0)
MCHC: 36.3 g/dL — ABNORMAL HIGH (ref 30.0–36.0)
MCV: 101.3 fL — ABNORMAL HIGH (ref 78.0–100.0)
Monocytes Absolute: 1.9 10*3/uL — ABNORMAL HIGH (ref 0.1–1.0)
Monocytes Relative: 9 %
NEUTROS ABS: 12.3 10*3/uL — AB (ref 1.7–7.7)
NEUTROS PCT: 57 %
Platelets: 490 10*3/uL — ABNORMAL HIGH (ref 150–400)
RBC: 2.23 MIL/uL — ABNORMAL LOW (ref 3.87–5.11)
RDW: 21 % — ABNORMAL HIGH (ref 11.5–15.5)
WBC: 21.5 10*3/uL — ABNORMAL HIGH (ref 4.0–10.5)

## 2018-05-16 LAB — RAPID URINE DRUG SCREEN, HOSP PERFORMED
Amphetamines: NOT DETECTED
Benzodiazepines: NOT DETECTED
Cocaine: POSITIVE — AB
OPIATES: NOT DETECTED
Tetrahydrocannabinol: NOT DETECTED

## 2018-05-16 LAB — BASIC METABOLIC PANEL
Anion gap: 6 (ref 5–15)
BUN: 16 mg/dL (ref 6–20)
CHLORIDE: 107 mmol/L (ref 98–111)
CO2: 24 mmol/L (ref 22–32)
CREATININE: 0.68 mg/dL (ref 0.44–1.00)
Calcium: 8.8 mg/dL — ABNORMAL LOW (ref 8.9–10.3)
Glucose, Bld: 107 mg/dL — ABNORMAL HIGH (ref 70–99)
Potassium: 5.6 mmol/L — ABNORMAL HIGH (ref 3.5–5.1)
SODIUM: 137 mmol/L (ref 135–145)

## 2018-05-16 LAB — RETICULOCYTES
RBC.: 2.23 MIL/uL — AB (ref 3.87–5.11)
RETIC CT PCT: 22.3 % — AB (ref 0.4–3.1)
Retic Count, Absolute: 497.3 10*3/uL — ABNORMAL HIGH (ref 19.0–186.0)

## 2018-05-16 LAB — PREGNANCY, URINE: Preg Test, Ur: NEGATIVE

## 2018-05-16 MED ORDER — KETOROLAC TROMETHAMINE 30 MG/ML IJ SOLN
30.0000 mg | INTRAMUSCULAR | Status: AC
  Administered 2018-05-16: 30 mg via INTRAVENOUS
  Filled 2018-05-16: qty 1

## 2018-05-16 MED ORDER — HYDROMORPHONE HCL 1 MG/ML IJ SOLN
1.0000 mg | INTRAMUSCULAR | Status: AC
  Administered 2018-05-16: 1 mg via INTRAVENOUS
  Filled 2018-05-16: qty 1

## 2018-05-16 MED ORDER — NALOXONE HCL 0.4 MG/ML IJ SOLN
0.4000 mg | INTRAMUSCULAR | Status: DC | PRN
Start: 2018-05-16 — End: 2018-05-20

## 2018-05-16 MED ORDER — POLYETHYLENE GLYCOL 3350 17 G PO PACK
17.0000 g | PACK | Freq: Every day | ORAL | Status: DC | PRN
  Administered 2018-05-19: 17 g via ORAL
  Filled 2018-05-16: qty 1

## 2018-05-16 MED ORDER — ONDANSETRON HCL 4 MG/2ML IJ SOLN: 4.0000 mg | Freq: Four times a day (QID) | INTRAMUSCULAR | Status: DC | PRN

## 2018-05-16 MED ORDER — ENOXAPARIN SODIUM 30 MG/0.3ML ~~LOC~~ SOLN
30.0000 mg | SUBCUTANEOUS | Status: DC
  Administered 2018-05-17: 30 mg via SUBCUTANEOUS
  Filled 2018-05-16: qty 0.3

## 2018-05-16 MED ORDER — HYDROMORPHONE HCL 1 MG/ML IJ SOLN: 0.5000 mg | Freq: Once | INTRAMUSCULAR | Status: DC

## 2018-05-16 MED ORDER — HYDROMORPHONE HCL 1 MG/ML IJ SOLN: 1.0000 mg | INTRAMUSCULAR | Status: AC

## 2018-05-16 MED ORDER — HYDROMORPHONE HCL 1 MG/ML IJ SOLN
0.5000 mg | INTRAMUSCULAR | Status: AC
  Administered 2018-05-16: 0.5 mg via INTRAVENOUS
  Filled 2018-05-16: qty 1

## 2018-05-16 MED ORDER — KETOROLAC TROMETHAMINE 15 MG/ML IJ SOLN
15.0000 mg | Freq: Four times a day (QID) | INTRAMUSCULAR | Status: DC
  Administered 2018-05-16 – 2018-05-17 (×6): 15 mg via INTRAVENOUS
  Filled 2018-05-16 (×6): qty 1

## 2018-05-16 MED ORDER — HYDROMORPHONE HCL 1 MG/ML IJ SOLN
1.0000 mg | INTRAMUSCULAR | Status: DC
  Filled 2018-05-16: qty 1

## 2018-05-16 MED ORDER — DIPHENHYDRAMINE HCL 25 MG PO CAPS
25.0000 mg | ORAL_CAPSULE | ORAL | Status: DC | PRN
  Administered 2018-05-16: 25 mg via ORAL
  Filled 2018-05-16: qty 1

## 2018-05-16 MED ORDER — HYDROMORPHONE HCL 1 MG/ML IJ SOLN: 1.0000 mg | INTRAMUSCULAR | Status: DC

## 2018-05-16 MED ORDER — DIPHENHYDRAMINE HCL 12.5 MG/5ML PO ELIX
12.5000 mg | ORAL_SOLUTION | Freq: Four times a day (QID) | ORAL | Status: DC | PRN
  Administered 2018-05-16: 12.5 mg via ORAL
  Filled 2018-05-16: qty 5

## 2018-05-16 MED ORDER — DIPHENHYDRAMINE HCL 50 MG/ML IJ SOLN: 12.5000 mg | Freq: Four times a day (QID) | INTRAMUSCULAR | Status: DC | PRN

## 2018-05-16 MED ORDER — HYDROMORPHONE 1 MG/ML IV SOLN
INTRAVENOUS | Status: DC
  Administered 2018-05-16: 303 mg via INTRAVENOUS
  Administered 2018-05-16: 1.5 mg via INTRAVENOUS
  Administered 2018-05-16: 2.6 mg via INTRAVENOUS
  Administered 2018-05-16: 30 mg via INTRAVENOUS
  Administered 2018-05-17: 3.6 mg via INTRAVENOUS
  Administered 2018-05-17: 2.4 mg via INTRAVENOUS
  Filled 2018-05-16: qty 30

## 2018-05-16 MED ORDER — NALOXONE HCL 0.4 MG/ML IJ SOLN: 0.4000 mg | INTRAMUSCULAR | Status: DC | PRN

## 2018-05-16 MED ORDER — SODIUM CHLORIDE 0.9% FLUSH: 9.0000 mL | INTRAVENOUS | Status: DC | PRN

## 2018-05-16 MED ORDER — SENNOSIDES-DOCUSATE SODIUM 8.6-50 MG PO TABS
1.0000 | ORAL_TABLET | Freq: Two times a day (BID) | ORAL | Status: DC
  Administered 2018-05-16 – 2018-05-20 (×8): 1 via ORAL
  Filled 2018-05-16 (×9): qty 1

## 2018-05-16 MED ORDER — DEXTROSE-NACL 5-0.45 % IV SOLN
INTRAVENOUS | Status: DC
  Administered 2018-05-16 – 2018-05-20 (×5): via INTRAVENOUS

## 2018-05-16 MED ORDER — HYDROMORPHONE HCL 1 MG/ML IJ SOLN
1.0000 mg | INTRAMUSCULAR | Status: AC
  Administered 2018-05-16 (×2): 1 mg via INTRAVENOUS
  Filled 2018-05-16: qty 1

## 2018-05-16 MED ORDER — ONDANSETRON HCL 4 MG/2ML IJ SOLN
4.0000 mg | Freq: Four times a day (QID) | INTRAMUSCULAR | Status: DC | PRN
  Administered 2018-05-18 – 2018-05-20 (×4): 4 mg via INTRAVENOUS
  Filled 2018-05-16 (×5): qty 2

## 2018-05-16 MED ORDER — ONDANSETRON HCL 4 MG/2ML IJ SOLN
4.0000 mg | INTRAMUSCULAR | Status: DC | PRN
  Administered 2018-05-16: 4 mg via INTRAVENOUS
  Filled 2018-05-16: qty 2

## 2018-05-16 MED ORDER — DIPHENHYDRAMINE HCL 12.5 MG/5ML PO ELIX: 12.5000 mg | ORAL_SOLUTION | Freq: Four times a day (QID) | ORAL | Status: DC | PRN

## 2018-05-16 MED ORDER — HYDROMORPHONE HCL 1 MG/ML IJ SOLN: 0.5000 mg | INTRAMUSCULAR | Status: AC

## 2018-05-16 NOTE — ED Notes (Signed)
ED TO INPATIENT HANDOFF REPORT  Name/Age/Gender [name] 22 y.o. female  Code Status Code Status History    Date Active Date Inactive Code Status Order ID Comments User Context   03/06/2018 0415 03/09/2018 1643 Full Code [id number]  Norval Morton, MD ED   12/16/2017 0006 12/22/2017 2026 Full Code [id number]  Norval Morton, MD ED   04/19/2017 0046 04/20/2017 1713 Full Code [id number]  Toy Baker, MD Inpatient   02/26/2017 0558 02/27/2017 1555 Full Code [id number]  Rise Patience, MD Inpatient   02/12/2017 2249 02/14/2017 1947 Full Code [id number]  Karmen Bongo, MD Inpatient   12/22/2016 0002 12/23/2016 1449 Full Code [id number]  Reubin Milan, MD Inpatient   12/22/2016 0002 12/22/2016 0002 Full Code [id number]  Reubin Milan, MD Inpatient   10/20/2016 0105 10/21/2016 1552 Full Code [id number]  Toy Baker, MD ED   05/14/2016 2123 05/18/2016 1829 Full Code [id number]  Vivi Barrack, MD Inpatient   11/05/2015 0453 11/08/2015 1906 Full Code [id number]  Archie Patten, MD Inpatient   08/12/2015 0207 08/15/2015 1810 Full Code [id number]  Melancon, York Ram, MD Inpatient   03/10/2015 1545 03/11/2015 2051 Full Code [id number]  Olam Idler, MD Inpatient   03/09/2015 0650 03/10/2015 1206 Full Code [id number]  Melancon, York Ram, MD Inpatient   02/25/2015 0712 02/26/2015 1936 Full Code [id number]  Orvan Falconer, MD Inpatient   08/30/2014 0847 09/02/2014 2338 Full Code [id number]  Cordelia Poche, MD Inpatient   02/26/2014 0224 03/02/2014 1648 Full Code [id number]  Cordelia Poche, MD Inpatient   11/24/2013 0115 11/24/2013 2233 Full Code [id number]  Timmothy Euler, MD Inpatient   05/15/2013 0324 05/21/2013 1812 Full Code [id number]  Nolon Rod, DO Inpatient   05/15/2013 0221 05/15/2013 0324 Full Code [id number]  Nolon Rod, DO ED   03/08/2013 0902 03/08/2013 1838 Full Code [id number]  Coral Spikes, DO Inpatient   02/14/2013 0603 02/15/2013 1938 Full Code [id number]  Angelica Ran, MD ED   07/25/2012 0043 07/28/2012 1357 Full Code [id number]  Ayesha Rumpf, RN Inpatient   07/05/2012 0901 07/05/2012 1929 Full Code [id number]  Ma Hillock, DO Inpatient   09/20/2011 0329 09/21/2011 1041 Full Code [id number]  Altamese Dilling, RN Inpatient      Home/SNF/Other Home  Chief Complaint sickle cell  Level of Care/Admitting Diagnosis ED Disposition    ED Disposition Condition [facility] Area: [place] [[id number]]  Level of Care: Med-Surg [16]  Diagnosis: Sickle cell anemia [facility]) [[id number]]  Admitting Physician: Tresa Garter [[id number]]  Attending Physician: Tresa Garter [[id number]]  Estimated length of stay: past midnight tomorrow  Certification:: I certify this patient will need inpatient services for at least 2 midnights  PT Class (Do Not Modify): Inpatient [101]  PT Acc Code (Do Not Modify): Private [1]       Medical History Past Medical History:  Diagnosis Date  . Acute kidney injury ([place]) 05/15/2016  . Amenorrhea   . Anemia    SICKLE CELL  . Asthma   . Drug-induced pruritus 02/26/2015  . GERD (gastroesophageal reflux disease)   . Headache   . Hyperbilirubinemia 02/26/2015  . Sickle cell disease ([place])     Allergies No Known Allergies  IV Location/Drains/Wounds Patient Lines/Drains/Airways Status   Active Line/Drains/Airways    Name:   Placement date:   Placement time:   Site:   Days:   Peripheral IV 05/16/18 Right Hand   05/16/18  0346    Hand   less than 1          Labs/Imaging Results for orders placed or performed during the hospital encounter of 05/16/18 (from the past 48 hour(s))  CBC with Differential     Status: Abnormal   Collection Time: 05/16/18  3:27 AM  Result Value Ref Range   WBC 21.5 (H) 4.0 - 10.5 K/uL   RBC 2.23 (L) 3.87 - 5.11 MIL/uL   Hemoglobin 8.2 (L) 12.0 - 15.0 g/dL   HCT 22.6 (L) 36.0 - 46.0 %   MCV 101.3 (H) 78.0 - 100.0 fL   MCH 36.8 (H) 26.0 - 34.0 pg   MCHC 36.3 (H) 30.0 - 36.0  g/dL   RDW 21.0 (H) 11.5 - 15.5 %   Platelets 490 (H) 150 - 400 K/uL   Neutrophils Relative % 57 %   Lymphocytes Relative 33 %   Monocytes Relative 9 %   Eosinophils Relative 1 %   Basophils Relative 0 %   Neutro Abs 12.3 (H) 1.7 - 7.7 K/uL   Lymphs Abs 7.1 (H) 0.7 - 4.0 K/uL   Monocytes Absolute 1.9 (H) 0.1 - 1.0 K/uL   Eosinophils Absolute 0.2 0.0 - 0.7 K/uL   Basophils Absolute 0.0 0.0 - 0.1 K/uL   RBC Morphology POLYCHROMASIA PRESENT     Comment: TARGET CELLS Sickle cells present Performed at [facility], [place] [address] [place], [place] [address]   Basic metabolic panel     Status: Abnormal   Collection Time: 05/16/18  3:27 AM  Result Value Ref Range   Sodium 137 135 - 145 mmol/L   Potassium 5.6 (H) 3.5 - 5.1 mmol/L    Comment: MODERATE HEMOLYSIS   Chloride 107 98 - 111 mmol/L    Comment: Please note change in reference range.   CO2 24 22 - 32 mmol/L   Glucose, Bld 107 (H) 70 - 99 mg/dL    Comment: Please note change in reference range.   BUN 16 6 - 20 mg/dL    Comment: Please note change in reference range.   Creatinine, Ser 0.68 0.44 - 1.00 mg/dL   Calcium 8.8 (L) 8.9 - 10.3 mg/dL   GFR calc non Af Amer >60 >60 mL/min   GFR calc Af Amer >60 >60 mL/min    Comment: (NOTE) The eGFR has been calculated using the CKD EPI equation. This calculation has not been validated in all clinical situations. eGFR's persistently <60 mL/min signify possible Chronic Kidney Disease.    Anion gap 6 5 - 15    Comment: Performed at [facility], [place] [address], [place], [place] [address]  Reticulocytes     Status: Abnormal   Collection Time: 05/16/18  3:27 AM  Result Value Ref Range   Retic Ct Pct 22.3 (H) 0.4 - 3.1 %   RBC. 2.23 (L) 3.87 - 5.11 MIL/uL   Retic Count, Absolute 497.3 (H) 19.0 - 186.0 K/uL    Comment: Performed at [facility], [place] [address], [place], [place] [address]  Pregnancy, urine     Status: None    Collection Time: 05/16/18  3:27 AM  Result Value Ref Range   Preg Test, Ur NEGATIVE NEGATIVE    Comment:        THE SENSITIVITY OF THIS METHODOLOGY IS >20 mIU/mL. Performed at [facility], [place] [address], [place], [place] [address]   Rapid urine drug screen (hospital performed)  Status: Abnormal   Collection Time: 05/16/18  3:27 AM  Result Value Ref Range   Opiates NONE DETECTED NONE DETECTED   Cocaine POSITIVE (A) NONE DETECTED   Benzodiazepines NONE DETECTED NONE DETECTED   Amphetamines NONE DETECTED NONE DETECTED   Tetrahydrocannabinol NONE DETECTED NONE DETECTED   Barbiturates (A) NONE DETECTED    Result not available. Reagent lot number recalled by manufacturer.    Comment: Performed at [facility], [place] [address], [place], [place] [address]  Potassium     Status: None   Collection Time: 05/16/18  7:54 AM  Result Value Ref Range   Potassium 4.6 3.5 - 5.1 mmol/L    Comment: DELTA CHECK NOTED REPEATED TO VERIFY Performed at [place] [address] [place], [place] [address]    No results found.  Pending Labs [employer] (From admission, onward)   Start     Ordered   Signed and Held  HIV antibody (Routine Testing)  Once,   R     Signed and Held   Signed and Held  CBC  (enoxaparin (LOVENOX)    CrCl < 30 ml/min)  Once,   R    Comments:  Baseline for enoxaparin therapy IF NOT ALREADY DRAWN.  Notify MD if PLT < 100 K.    Signed and Held   Signed and Held  Creatinine, serum  (enoxaparin (LOVENOX)    CrCl < 30 ml/min)  Once,   R    Comments:  Baseline for enoxaparin therapy IF NOT ALREADY DRAWN.    Signed and Held   Signed and Held  Creatinine, serum  (enoxaparin (LOVENOX)    CrCl < 30 ml/min)  Weekly,   R    Comments:  while on enoxaparin therapy.    Signed and Held   Signed and Held  Comprehensive metabolic panel  Tomorrow morning,   R     Signed and Held   Signed and Held  CBC with  Differential/Platelet  Tomorrow morning,   R     Signed and Held      Vitals/Pain Today's Vitals   05/16/18 0755 05/16/18 0756 05/16/18 0857 05/16/18 0930  BP: 129/87 129/87 126/81 119/83  Pulse: 63 63 64 66  Resp: '16 15 16 ' (!) 22  Temp:      TempSrc:      SpO2: 97% 100% 97% 96%  Weight:      PainSc: Asleep       Isolation Precautions No active isolations  Medications Medications  HYDROmorphone (DILAUDID) injection 1 mg (0 mg Intravenous Hold 05/16/18 0858)    Or  HYDROmorphone (DILAUDID) injection 1 mg ( Subcutaneous See Alternative 05/16/18 0858)  ondansetron (ZOFRAN) injection 4 mg (4 mg Intravenous Given 05/16/18 0404)  diphenhydrAMINE (BENADRYL) capsule 25-50 mg (25 mg Oral Given 05/16/18 0403)  HYDROmorphone (DILAUDID) injection 0.5 mg (0 mg Intravenous Hold 05/16/18 0858)  ketorolac (TORADOL) 15 MG/ML injection 15 mg (15 mg Intravenous Given 05/16/18 1022)  ketorolac (TORADOL) 30 MG/ML injection 30 mg (30 mg Intravenous Given 05/16/18 0404)  HYDROmorphone (DILAUDID) injection 0.5 mg (0.5 mg Intravenous Given 05/16/18 0404)    Or  HYDROmorphone (DILAUDID) injection 0.5 mg ( Subcutaneous See Alternative 05/16/18 0404)  HYDROmorphone (DILAUDID) injection 1 mg (1 mg Intravenous Given 05/16/18 0510)    Or  HYDROmorphone (DILAUDID) injection 1 mg ( Subcutaneous See Alternative 05/16/18 0510)  HYDROmorphone (DILAUDID) injection 1 mg (1 mg Intravenous Given 05/16/18 0854)    Or  HYDROmorphone (DILAUDID)  injection 1 mg ( Subcutaneous See Alternative 05/16/18 0854)    Mobility walks with person assist

## 2018-05-16 NOTE — ED Provider Notes (Signed)
[facility] DEPT Provider Note   CSN: [id number] Arrival date & time: 05/16/18  0244     History   Chief Complaint Chief Complaint  Patient presents with  . Sickle Cell Pain Crisis    HPI [name] is a 22 y.o. female.  Patient arrives via EMS for pain in the left shoulder she feels is typical of her sickle cell pain. Pain started yesterday afternoon and persists even with taking her home medications. No chest pain, SOB, fever, nausea or vomiting. No shoulder injury.   The history is provided by the patient. No language interpreter was used.    Past Medical History:  Diagnosis Date  . Acute kidney injury ([place]) 05/15/2016  . Amenorrhea   . Anemia    SICKLE CELL  . Asthma   . Drug-induced pruritus 02/26/2015  . GERD (gastroesophageal reflux disease)   . Headache   . Hyperbilirubinemia 02/26/2015  . Sickle cell disease [facility])     Patient Active Problem List   Diagnosis Date Noted  . Sickle cell anemia with crisis ([place]) 12/16/2017  . Substance abuse ([place]) 12/16/2017  . Homelessness 02/12/2017  . Protein-calorie malnutrition, severe 10/21/2016  . Anemia due to other cause   . Hb-SS disease without crisis ([place])   . Hereditary hemolytic anemia ([place])   . 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 Tobacco abuse   . Leukocytosis   . Sickle cell pain crisis ([place]) 02/15/2013  . Abdominal pain 07/25/2012  . Asthma, mild intermittent 12/14/2010    Past Surgical History:  Procedure Laterality Date  . 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 TONSILLECTOMY     Age 63     OB History   None      Home Medications    Prior to Admission medications   Medication Sig Start Date End Date Taking? Authorizing Provider  ibuprofen (ADVIL,MOTRIN) 600 MG tablet Take 1 tablet (600 mg total) by mouth every 6 (six) hours as needed. Patient not taking: Reported on 11/20/2017 07/26/17   Domenic Moras, PA-C    Family  History Family History  Problem Relation Age of Onset  . Hypertension Paternal Grandfather   . Sickle cell trait Father   . Cancer Mother        Died in 30-Jan-2009    Social History Social History   Tobacco Use  . Smoking status: Current Every Day Smoker    Packs/day: 0.50    Types: Cigarettes  . Smokeless tobacco: Never Used  Substance Use Topics  . Alcohol use: No    Alcohol/week: 3.6 oz    Types: 6 Shots of liquor per week    Comment: Denies 06/12/2014  . Drug use: Yes    Types: Marijuana, Cocaine     Allergies   Patient has no known allergies.   Review of Systems Review of Systems  Constitutional: Negative for chills and fever.  Respiratory: Negative.  Negative for shortness of breath.   Cardiovascular: Negative.  Negative for chest pain.  Gastrointestinal: Negative.  Negative for nausea and vomiting.  Musculoskeletal:       See HPI.  Skin: Negative.   Neurological: Negative.      Physical Exam Updated Vital Signs BP 122/60   Pulse 86   Temp 98.6 F (37 C) (Oral)   Resp (!) 24   Wt 59.9 kg (132 lb)   LMP 04/23/2018   SpO2 99%   BMI 21.97 kg/m  Physical Exam  Constitutional: She is oriented to person, place, and time. She appears well-developed and well-nourished.  Patient very uncomfortable appearing.   HENT:  Head: Normocephalic.  Neck: Normal range of motion. Neck supple.  Cardiovascular: Normal rate, regular rhythm and intact distal pulses.  No murmur heard. Pulmonary/Chest: Effort normal and breath sounds normal. She has no wheezes. She has no rales.  Abdominal: Soft. Bowel sounds are normal. There is no tenderness. There is no rebound and no guarding.  Musculoskeletal: Normal range of motion.  Left shoulder and upper arm are unremarkable in appearance. No redness or swelling. FROM. No other extremity tenderness.   Neurological: She is alert and oriented to person, place, and time.  Skin: Skin is warm and dry. No rash noted.  Psychiatric: She  has a normal mood and affect.     ED Treatments / Results  Labs (all labs ordered are listed, but only abnormal results are displayed) Labs Reviewed  CBC WITH DIFFERENTIAL/PLATELET  BASIC METABOLIC PANEL  RETICULOCYTES  PREGNANCY, URINE   Results for orders placed or performed during the hospital encounter of 05/16/18  CBC with Differential  Result Value Ref Range   WBC 21.5 (H) 4.0 - 10.5 K/uL   RBC 2.23 (L) 3.87 - 5.11 MIL/uL   Hemoglobin 8.2 (L) 12.0 - 15.0 g/dL   HCT 22.6 (L) 36.0 - 46.0 %   MCV 101.3 (H) 78.0 - 100.0 fL   MCH 36.8 (H) 26.0 - 34.0 pg   MCHC 36.3 (H) 30.0 - 36.0 g/dL   RDW 21.0 (H) 11.5 - 15.5 %   Platelets 490 (H) 150 - 400 K/uL   Neutrophils Relative % 57 %   Lymphocytes Relative 33 %   Monocytes Relative 9 %   Eosinophils Relative 1 %   Basophils Relative 0 %   Neutro Abs 12.3 (H) 1.7 - 7.7 K/uL   Lymphs Abs 7.1 (H) 0.7 - 4.0 K/uL   Monocytes Absolute 1.9 (H) 0.1 - 1.0 K/uL   Eosinophils Absolute 0.2 0.0 - 0.7 K/uL   Basophils Absolute 0.0 0.0 - 0.1 K/uL   RBC Morphology POLYCHROMASIA PRESENT   Basic metabolic panel  Result Value Ref Range   Sodium 137 135 - 145 mmol/L   Potassium 5.6 (H) 3.5 - 5.1 mmol/L   Chloride 107 98 - 111 mmol/L   CO2 24 22 - 32 mmol/L   Glucose, Bld 107 (H) 70 - 99 mg/dL   BUN 16 6 - 20 mg/dL   Creatinine, Ser 0.68 0.44 - 1.00 mg/dL   Calcium 8.8 (L) 8.9 - 10.3 mg/dL   GFR calc non Af Amer >60 >60 mL/min   GFR calc Af Amer >60 >60 mL/min   Anion gap 6 5 - 15  Reticulocytes  Result Value Ref Range   Retic Ct Pct 22.3 (H) 0.4 - 3.1 %   RBC. 2.23 (L) 3.87 - 5.11 MIL/uL   Retic Count, Absolute 497.3 (H) 19.0 - 186.0 K/uL  Pregnancy, urine  Result Value Ref Range   Preg Test, Ur NEGATIVE NEGATIVE    EKG None  Radiology No results found.  Procedures Procedures (including critical care time)  Medications Ordered in ED Medications  ketorolac (TORADOL) 30 MG/ML injection 30 mg (has no administration in time  range)  HYDROmorphone (DILAUDID) injection 0.5 mg (has no administration in time range)    Or  HYDROmorphone (DILAUDID) injection 0.5 mg (has no administration in time range)  HYDROmorphone (DILAUDID) injection 1 mg (has no administration  in time range)    Or  HYDROmorphone (DILAUDID) injection 1 mg (has no administration in time range)  HYDROmorphone (DILAUDID) injection 1 mg (has no administration in time range)    Or  HYDROmorphone (DILAUDID) injection 1 mg (has no administration in time range)  HYDROmorphone (DILAUDID) injection 1 mg (has no administration in time range)    Or  HYDROmorphone (DILAUDID) injection 1 mg (has no administration in time range)  ondansetron (ZOFRAN) injection 4 mg (has no administration in time range)  diphenhydrAMINE (BENADRYL) capsule 25-50 mg (has no administration in time range)     Initial Impression / Assessment and Plan / ED Course  I have reviewed the triage vital signs and the nursing notes.  Pertinent labs & imaging results that were available during my care of the patient were reviewed by me and considered in my medical decision making (see chart for details).    Patient with history of sickle cell disease here with left upper arm and shoulder pain x 1 day, typical of crisis pain.   6:10 - Labs are baseline for patient. Potassium 5.6, felt due to hemolysis, pending repeat value. She has been given 2 doses of protocol Dilaudid (1 mg) and is sleeping on re-evaluation. VSS, no hypoxia. Will continue to monitor.   6:20 - the patient is awake and crying out in pain, is loud and demanding. She states she wants to go to [facility]. She was informed that [facility] has the same medication protocol there. She was also reassured that if she had not been sound asleep minutes before, the regular dosing of Dilaudid would have been given. 3rd dose Dilaudid 1 mg being given now.   Pain medication provided and patient went back to sleep.   Patient chart reviewed.  History of longstanding cocaine abuse. UDS added.   7:00 woke the patient to ask about her pain. She is speaking with slurred speech, not opening her eyes until I discussed her being somnolent and then she states she is awake and still in pain, "I need more pain medication". Will continue to observe.   Patient care signed out to [facility], PA-C, pending reassessment of the patient for pain control. She is scheduled for one more dose of Dilaudid to be given at nurse's discretion based on patient alertness and vital signs. If she continues to have episodes/spikes of significant pain, will call Sickle Cell clinic at 8:00 to transfer.   Final Clinical Impressions(s) / ED Diagnoses   Final diagnoses:  None   1. Sickle cell anemia with pain  ED Discharge Orders    None       Charlann Lange, PA-C 05/16/18 6440    Ezequiel Essex, MD 05/16/18 [phone]

## 2018-05-16 NOTE — H&P (Signed)
H&P  [name], is a 22 y.o. female  MRN: [id number]   DOB - 07/24/1996  Admit Date - 05/16/2018  Outpatient Primary MD for the patient is Patient, No Pcp Per  Chief Complaint  Patient presents with  . Sickle Cell Pain Crisis      HPI:   [name]  is a 22 y.o. female with a medical history significant for sickle cell anemia, polysubstance abuse, hemoglobin SS presented to the emergency department with pain primarily to lower extremities, which is consistent with typical sickle cell crisis.  Patient states that pain started yesterday and she has not identified any palliative or provocative factors related to sickle cell crisis.  Patient states that she does not have a primary provider and has not been taking his medications for sickle cell anemia.  Pain intensity is 10/10 characterized as constant and throbbing. Patient is afebrile. Patient denies chest pain, heart palpitations, shortness of breath, dysuria, nausea, vomiting,or diarrhea.   ED course:  Upon admission into the emergency department patient afebrile, oxygen saturation maintained on room air. WBC 21.5, hemoglobin 8.2 (consistent with baseline. Pain not controlled with dilaudid 3 mg total and Toradol 30 mg times one. Sickle cell rounding called to admit patient.    Review of systems:  Review of Systems  Constitutional: Negative.   HENT: Negative.   Eyes: Negative.   Respiratory: Negative for cough and hemoptysis.   Cardiovascular: Negative for chest pain and palpitations.  Gastrointestinal: Negative.   Genitourinary: Negative.   Musculoskeletal: Positive for joint pain.  Skin: Negative.   Neurological: Negative.   Endo/Heme/Allergies: Negative.   Psychiatric/Behavioral: Negative.      With Past History of the following :   Past Medical History:  Diagnosis Date  . Acute kidney injury ([place]) 05/15/2016  . Amenorrhea   . Anemia    SICKLE CELL  . Asthma   . Drug-induced  pruritus 02/26/2015  . GERD (gastroesophageal reflux disease)   . Headache   . Hyperbilirubinemia 02/26/2015  . Sickle cell disease ([place])       Past Surgical History:  Procedure Laterality Date  . SPLENECTOMY     Age 81 for sequestration  . TONSILLECTOMY     Age 89     Social History:   Social History   Tobacco Use  . Smoking status: Current Every Day Smoker    Packs/day: 0.50    Types: Cigarettes  . Smokeless tobacco: Never Used  Substance Use Topics  . Alcohol use: No    Alcohol/week: 3.6 oz    Types: 6 Shots of liquor per week    Comment: Denies 06/12/2014     Lives - At home   Family History :   Family History  Problem Relation Age of Onset  . Hypertension Paternal Grandfather   . Sickle cell trait Father   . Cancer Mother        Died in 02-21-09     Home Medications:   Prior to Admission medications   Medication Sig Start Date End Date Taking? Authorizing Provider  ibuprofen (ADVIL,MOTRIN) 600 MG tablet Take 1 tablet (600 mg total) by mouth every 6 (six) hours as needed. Patient not taking: Reported on 11/20/2017 07/26/17   Domenic Moras, PA-C     Allergies:   No Known Allergies   Physical Exam:   Vitals:   Vitals:   05/16/18 0755 05/16/18 0756  BP: 129/87 129/87  Pulse: 63 63  Resp: 16 15  Temp:  SpO2: 97% 100%    Physical Exam: Constitutional: Patient appears well-developed and well-nourished. Patient in distress, writhing in bed, inconsolable.  HENT: Normocephalic, atraumatic, External right and left ear normal. Oropharynx is clear and moist.  Eyes: Conjunctivae and EOM are normal. PERRLA, no scleral icterus. Neck: Normal ROM. Neck supple. No JVD. No tracheal deviation. No thyromegaly. CVS: RRR, S1/S2 +, no murmurs, no gallops, no carotid bruit.  Pulmonary: Effort and breath sounds normal, no stridor, rhonchi, wheezes, rales.  Abdominal: Soft. BS +, no distension, tenderness, rebound or guarding.  Musculoskeletal: Normal range of motion. No  edema and no tenderness.  Lymphadenopathy: No lymphadenopathy noted, cervical, inguinal or axillary Neuro: Alert. Normal reflexes, muscle tone coordination. No cranial nerve deficit. Skin: Skin is warm and dry. No rash noted. Not diaphoretic. No erythema. No pallor. Psychiatric: Normal mood and affect. Anxiety, speech pressured.    Data Review:   CBC Recent Labs  Lab 05/16/18 0327  WBC 21.5*  HGB 8.2*  HCT 22.6*  PLT 490*  MCV 101.3*  MCH 36.8*  MCHC 36.3*  RDW 21.0*  LYMPHSABS 7.1*  MONOABS 1.9*  EOSABS 0.2  BASOSABS 0.0   ------------------------------------------------------------------------------------------------------------------  Chemistries  Recent Labs  Lab 05/16/18 0327 05/16/18 0754  NA 137  --   K 5.6* 4.6  CL 107  --   CO2 24  --   GLUCOSE 107*  --   BUN 16  --   CREATININE 0.68  --   CALCIUM 8.8*  --    ------------------------------------------------------------------------------------------------------------------ estimated creatinine clearance is 98.4 mL/min (by C-G formula based on SCr of 0.68 mg/dL). ------------------------------------------------------------------------------------------------------------------ No results for input(s): TSH, T4TOTAL, T3FREE, THYROIDAB in the last 72 hours.  Invalid input(s): FREET3  Coagulation profile No results for input(s): INR, PROTIME in the last 168 hours. ------------------------------------------------------------------------------------------------------------------- No results for input(s): DDIMER in the last 72 hours. -------------------------------------------------------------------------------------------------------------------  Cardiac Enzymes No results for input(s): CKMB, TROPONINI, MYOGLOBIN in the last 168 hours.  Invalid input(s): CK ------------------------------------------------------------------------------------------------------------------ No results found for:  BNP  ---------------------------------------------------------------------------------------------------------------  Urinalysis    Component Value Date/Time   COLORURINE YELLOW 12/17/2017 [place] 12/17/2017 1530   LABSPEC 1.009 12/17/2017 1530   PHURINE 6.0 12/17/2017 1530   GLUCOSEU NEGATIVE 12/17/2017 1530   HGBUR NEGATIVE 12/17/2017 1530   BILIRUBINUR NEGATIVE 12/17/2017 1530   KETONESUR NEGATIVE 12/17/2017 1530   PROTEINUR NEGATIVE 12/17/2017 1530   UROBILINOGEN 1.0 08/11/2015 1948   NITRITE NEGATIVE 12/17/2017 1530   [place] 12/17/2017 1530    ----------------------------------------------------------------------------------------------------------------   Imaging Results:    No results found.    Assessment & Plan:  Active Problems:   Sickle cell anemia (HCC)   1. Hb Sickle Cell Disease with crisis:  Admit, start IVF D5 .45% Saline @ 125 mls/hour, patient opiate naive, custom dose PCA initiated  IV Toradol 30 mg Q 6 H Monitor vitals very closely, Re-evaluate pain scale regularly  Patient will be re-evaluated for pain in the context of function and relationship to baseline as care progresses. 2. Leukocytosis:  WBC 21.8, will continue to monitor. Repeat CBC in am  3. Sickle Cell Anemia:  Folic acid 1 mg   4. History of poly substance abuse Will review urine drug screen as results become available  DVT Prophylaxis: Subcut Lovenox   AM Labs Ordered, also please review Full Orders  Family Communication: Admission, patient's condition and plan of care including tests being ordered have been discussed with the patient who indicate understanding and agree with the  plan and Code Status.  Code Status: Full Code  Consults called: None    Admission status: Inpatient    Time spent in minutes : 50 minutes    Donia Pounds  MSN, FNP-C [facility] [address] [place], [place]  [address] [phone]  05/16/2018 at 8:48 AM

## 2018-05-16 NOTE — ED Triage Notes (Signed)
Pt complains of left shoulder pain that started about 3pm yesterday

## 2018-05-16 NOTE — ED Provider Notes (Signed)
Care handoff received from [facility].  On reevaluation: Patient is sleeping comfortably in bed, patient easily arousable.  Patient states that she is still in severe pain.  Patient states this feels like her normal sickle cell pain.  Patient denies any new or unusual pain today.  Patient denies fever, chest pain or shortness of breath at this time.  Patient denies nausea or vomiting.  Patient in no acute distress.  I have informed patient of plan to transfer her to sickle cell center for further treatment and observation.  Patient is agreeable.  Patient's potassium is within normal limits, 4.6.  I have spoken to the sickle cell pain clinic who have accepted the patient.  They have asked for [place] to discharge the patient and maintain IV access and have patient brought to their facility by nursing staff here.  I have informed nursing staff of care plan. ------------------------------------------------------------------- 8:37a Patient has been evaluated in emergency department by [facility] from [facility] center, they feel that the patient would be better managed inpatient.  Admission orders being placed by them.  They state that the patient still endorsing 10/10 pain at this time and have requested additional pain medication.  I have ordered a another 0.5 mg of Dilaudid.   Deliah [place], PA-C 05/16/18 8786    Ezequiel Essex, MD 05/16/18 [phone]

## 2018-05-17 DIAGNOSIS — D72829 Elevated white blood cell count, unspecified: Secondary | ICD-10-CM

## 2018-05-17 DIAGNOSIS — F1910 Other psychoactive substance abuse, uncomplicated: Secondary | ICD-10-CM

## 2018-05-17 DIAGNOSIS — R748 Abnormal levels of other serum enzymes: Secondary | ICD-10-CM

## 2018-05-17 LAB — CBC WITH DIFFERENTIAL/PLATELET
BASOS ABS: 0.1 10*3/uL (ref 0.0–0.1)
Basophils Relative: 0 %
EOS PCT: 1 %
Eosinophils Absolute: 0.1 10*3/uL (ref 0.0–0.7)
HEMATOCRIT: 21.8 % — AB (ref 36.0–46.0)
HEMOGLOBIN: 7.8 g/dL — AB (ref 12.0–15.0)
LYMPHS ABS: 5.6 10*3/uL (ref 0.7–4.0)
LYMPHS PCT: 32 %
MCH: 36.1 pg — AB (ref 26.0–34.0)
MCHC: 35.8 g/dL (ref 30.0–36.0)
MCV: 100.9 fL — ABNORMAL HIGH (ref 78.0–100.0)
MONO ABS: 1.3 10*3/uL (ref 0.1–1.0)
Monocytes Relative: 8 %
Neutro Abs: 10.6 10*3/uL (ref 1.7–7.7)
Neutrophils Relative %: 59 %
Platelets: 565 10*3/uL — ABNORMAL HIGH (ref 150–400)
RBC: 2.16 MIL/uL — ABNORMAL LOW (ref 3.87–5.11)
RDW: 21.3 % — AB (ref 11.5–15.5)
WBC: 17.6 10*3/uL — AB (ref 4.0–10.5)
nRBC: 10 /100 WBC — ABNORMAL HIGH

## 2018-05-17 LAB — COMPREHENSIVE METABOLIC PANEL
ALBUMIN: 4 g/dL (ref 3.5–5.0)
ALT: 62 U/L — AB (ref 0–44)
AST: 73 U/L — AB (ref 15–41)
Alkaline Phosphatase: 81 U/L (ref 38–126)
Anion gap: 6 (ref 5–15)
BILIRUBIN TOTAL: 6.5 mg/dL — AB (ref 0.3–1.2)
BUN: 15 mg/dL (ref 6–20)
CO2: 27 mmol/L (ref 22–32)
CREATININE: 0.86 mg/dL (ref 0.44–1.00)
Calcium: 9 mg/dL (ref 8.9–10.3)
Chloride: 105 mmol/L (ref 98–111)
GFR calc Af Amer: >60 mL/min (ref >60)
GLUCOSE: 98 mg/dL (ref 70–99)
POTASSIUM: 4.4 mmol/L (ref 3.5–5.1)
Sodium: 138 mmol/L (ref 135–145)
TOTAL PROTEIN: 7.4 g/dL (ref 6.5–8.1)

## 2018-05-17 LAB — HIV ANTIBODY (ROUTINE TESTING W REFLEX): HIV SCREEN 4TH GENERATION: NONREACTIVE

## 2018-05-17 MED ORDER — HYDROXYZINE HCL 25 MG PO TABS
25.0000 mg | ORAL_TABLET | Freq: Four times a day (QID) | ORAL | Status: DC | PRN
Start: 2018-05-17 — End: 2018-05-20
  Administered 2018-05-17: 25 mg via ORAL
  Filled 2018-05-17 (×3): qty 1

## 2018-05-17 MED ORDER — HYDROMORPHONE 1 MG/ML IV SOLN
INTRAVENOUS | Status: DC
  Administered 2018-05-17: 2.7 mg via INTRAVENOUS
  Administered 2018-05-17: 3.9 mg via INTRAVENOUS
  Administered 2018-05-17: 2.6 mg via INTRAVENOUS
  Administered 2018-05-17: 1.5 mg via INTRAVENOUS
  Administered 2018-05-18: 2.7 mg via INTRAVENOUS
  Administered 2018-05-18: 2.1 mg via INTRAVENOUS
  Administered 2018-05-18 (×2): 2.4 mg via INTRAVENOUS
  Administered 2018-05-18: 2.7 mg via INTRAVENOUS
  Administered 2018-05-19: 1.8 mg via INTRAVENOUS
  Administered 2018-05-19: 30 mg via INTRAVENOUS
  Administered 2018-05-19: 2.1 mg via INTRAVENOUS
  Administered 2018-05-19: 1.2 mg via INTRAVENOUS
  Administered 2018-05-19: 3 mg via INTRAVENOUS
  Administered 2018-05-19: 0.9 mg via INTRAVENOUS
  Administered 2018-05-19: 3 mg via INTRAVENOUS
  Administered 2018-05-19: 5.1 mg via INTRAVENOUS
  Administered 2018-05-20: 5.4 mg via INTRAVENOUS
  Administered 2018-05-20: 1.8 mg via INTRAVENOUS
  Filled 2018-05-17 (×2): qty 30

## 2018-05-17 MED ORDER — HYDROMORPHONE HCL 1 MG/ML IJ SOLN
1.0000 mg | Freq: Once | INTRAMUSCULAR | Status: AC
  Administered 2018-05-17: 1 mg via INTRAVENOUS
  Filled 2018-05-17: qty 1

## 2018-05-17 MED ORDER — DIPHENHYDRAMINE HCL 25 MG PO CAPS
25.0000 mg | ORAL_CAPSULE | ORAL | Status: DC | PRN
  Administered 2018-05-17 – 2018-05-18 (×2): 25 mg via ORAL
  Administered 2018-05-18: 50 mg via ORAL
  Filled 2018-05-17: qty 2
  Filled 2018-05-17 (×2): qty 1

## 2018-05-17 MED ORDER — KETOROLAC TROMETHAMINE 15 MG/ML IJ SOLN
30.0000 mg | Freq: Four times a day (QID) | INTRAMUSCULAR | Status: DC
  Administered 2018-05-17 – 2018-05-20 (×10): 30 mg via INTRAVENOUS
  Filled 2018-05-17 (×10): qty 2

## 2018-05-17 MED ORDER — DIPHENHYDRAMINE HCL 50 MG PO CAPS
50.0000 mg | ORAL_CAPSULE | Freq: Once | ORAL | Status: AC
  Administered 2018-05-17: 50 mg via ORAL
  Filled 2018-05-17: qty 1

## 2018-05-17 NOTE — Progress Notes (Addendum)
Subjective: Evian Derringer, a 22 year old female admitted with sickle cell crisis. Patient says that pain intensity is not improving on PCA. She says that she has been maximizing PCA dilaudid. Patient is also complaining of intense itching that is unrelieved by hydroxyzine 25 mg and benadryl. She says that she typically itches with opiate medications. Pain is primarily to upper extremities along forearms. Current pain intensity is 7/10. Patient is afebrile. She denies headache, chest pain, shortness of breath, nausea, vomiting, diarrhea, or constipation.   Objective:  Vital signs in last 24 hours:  Vitals:   05/16/18 2344 05/17/18 0255 05/17/18 0409 05/17/18 0735  BP:   105/63   Pulse:   77   Resp: 19 17 16 15   Temp:   98.6 F (37 C)   TempSrc:   Oral   SpO2: 97% 98% 97%   Weight:   144 lb 2.9 oz (65.4 kg)     Intake/Output from previous day:   Intake/Output Summary (Last 24 hours) at 05/17/2018 1104 Last data filed at 05/17/2018 0600 Gross per 24 hour  Intake 1853.33 ml  Output 900 ml  Net 953.33 ml    Physical Exam: General: Alert, awake, oriented x3, in no acute distress.  HEENT: [place]/AT PEERL, EOMI Neck: Trachea midline,  no masses, no thyromegal,y no JVD, no carotid bruit OROPHARYNX:  Moist, No exudate/ erythema/lesions.  Heart: Regular rate and rhythm, without murmurs, rubs, gallops, PMI non-displaced, no heaves or thrills on palpation.  Lungs: Clear to auscultation, no wheezing or rhonchi noted. No increased vocal fremitus resonant to percussion  Abdomen: Soft, nontender, nondistended, positive bowel sounds, no masses no hepatosplenomegaly noted..  Neuro: No focal neurological deficits noted cranial nerves II through XII grossly intact. DTRs 2+ bilaterally upper and lower extremities. Strength 5 out of 5 in bilateral upper and lower extremities. Musculoskeletal: Anterior flexors tender to palpation, no warmth, or erythema around joints.   Psychiatric: Patient alert  and oriented x3, good insight and cognition, good recent to remote recall. Lymph node survey: No cervical axillary or inguinal lymphadenopathy noted.  Lab Results:  Basic Metabolic Panel:    Component Value Date/Time   NA 138 05/17/2018 0421   K 4.4 05/17/2018 0421   CL 105 05/17/2018 0421   CO2 27 05/17/2018 0421   BUN 15 05/17/2018 0421   CREATININE 0.86 05/17/2018 0421   GLUCOSE 98 05/17/2018 0421   CALCIUM 9.0 05/17/2018 0421   CBC:    Component Value Date/Time   WBC 17.6 (H) 05/17/2018 0421   HGB 7.8 (L) 05/17/2018 0421   HCT 21.8 (L) 05/17/2018 0421   PLT 565 (H) 05/17/2018 0421   MCV 100.9 (H) 05/17/2018 0421   NEUTROABS 10.6 05/17/2018 0421   LYMPHSABS 5.6 05/17/2018 0421   MONOABS 1.3 05/17/2018 0421   EOSABS 0.1 05/17/2018 0421   BASOSABS 0.1 05/17/2018 0421    No results found for this or any previous visit (from the past 240 hour(s)).  Studies/Results: No results found.  Medications: Scheduled Meds: . enoxaparin (LOVENOX) injection  30 mg Subcutaneous Q24H  . HYDROmorphone   Intravenous Q4H  . ketorolac  15 mg Intravenous Q6H  . senna-docusate  1 tablet Oral BID   Continuous Infusions: . dextrose 5 % and 0.45% NaCl 100 mL/hr at 05/17/18 0735   PRN Meds:.diphenhydrAMINE, hydrOXYzine, naloxone **AND** sodium chloride flush, ondansetron (ZOFRAN) IV, polyethylene glycol  Assessment/Plan: Active Problems:   Leukocytosis   Substance abuse (HCC)   Sickle cell anemia (HCC)   Sickle  cell crisis ([place])   Elevated liver enzymes   Hb Sickle Cell Disease with crisis:  Continue IVF D5 .45% Saline @ 75 mls/hour Continue custom dose Dilaudid PCA, will increase.  Continue IV Toradol 30 mg Q 6 H, Monitor vitals very closely.   Leukocytosis:  WBC improved slightly, will continue to monitor  Sickle Cell Anemia:  Hemoglobin slightly below baseline at 7.8, will continue to monitor, probably related to hemodilution.  Will continue folic acid 1 mg  History of  polysubstance abuse Positive for cocaine on UDS  Patient denies recreational use over the past several months. Counseled at length  Elevated liver enzymes Increase from baseline, will review hepatitis panel as results become available.     Code Status: Full Code Family Communication: N/A Disposition Plan: Not yet ready for discharge   Donia Pounds  MSN, FNP-C [facility] [address] [place], [place] [address] [phone]  If 5PM-8AM, please contact night-coverage.  05/17/2018, 11:04 AM  LOS: 1 day

## 2018-05-18 LAB — HEPATITIS PANEL, ACUTE
HEP A IGM: NEGATIVE
HEP B C IGM: NEGATIVE
HEP B S AG: NEGATIVE

## 2018-05-18 LAB — LACTATE DEHYDROGENASE: LDH: 410 U/L — ABNORMAL HIGH (ref 98–192)

## 2018-05-18 NOTE — Progress Notes (Signed)
Patient ID: [name], female   DOB: June 24, 1996, 22 y.o.   MRN: [id number] Subjective:  Patient is still in pain, rated at 7/10, generalized but worse in her upper extremities and lower back. She denies fever, denies chest pain, no SOB. No nausea or vomiting, no diarrhea or constipation.  Objective:  Vital signs in last 24 hours:  Vitals:   05/18/18 0845 05/18/18 1029 05/18/18 1245 05/18/18 1409  BP:  (!) 96/58 (!) 100/59   Pulse:  71 83   Resp: 17 16 16 13   Temp:  99 F (37.2 C)    TempSrc:  Oral    SpO2: 98% 95% 94% 97%  Weight:        Intake/Output from previous day:   Intake/Output Summary (Last 24 hours) at 05/18/2018 1559 Last data filed at 05/18/2018 1500 Gross per 24 hour  Intake 1965 ml  Output 2550 ml  Net -585 ml    Physical Exam: General: Alert, awake, oriented x3, in no acute distress.  HEENT: /AT [place]ER[place], EOMI Neck: Trachea midline,  no masses, no thyromegal,y no JVD, no carotid bruit OROPHARYNX:  Moist, No exudate/ erythema/lesions.  Heart: Regular rate and rhythm, without murmurs, rubs, gallops, PMI non-displaced, no heaves or thrills on palpation.  Lungs: Clear to auscultation, no wheezing or rhonchi noted. No increased vocal fremitus resonant to percussion  Abdomen: Soft, nontender, nondistended, positive bowel sounds, no masses no hepatosplenomegaly noted..  Neuro: No focal neurological deficits noted cranial nerves II through XII grossly intact. DTRs 2+ bilaterally upper and lower extremities. Strength 5 out of 5 in bilateral upper and lower extremities. Musculoskeletal: No warm swelling or erythema around joints, no spinal tenderness noted. Psychiatric: Patient alert and oriented x3, good insight and cognition, good recent to remote recall. Lymph node survey: No cervical axillary or inguinal lymphadenopathy noted.  Lab Results:  Basic Metabolic Panel:    Component Value Date/Time   NA 138 05/17/2018 0421   K 4.4 05/17/2018 0421   CL 105  05/17/2018 0421   CO2 27 05/17/2018 0421   BUN 15 05/17/2018 0421   CREATININE 0.86 05/17/2018 0421   GLUCOSE 98 05/17/2018 0421   CALCIUM 9.0 05/17/2018 0421   CBC:    Component Value Date/Time   WBC 17.6 (H) 05/17/2018 0421   HGB 7.8 (L) 05/17/2018 0421   HCT 21.8 (L) 05/17/2018 0421   PLT 565 (H) 05/17/2018 0421   MCV 100.9 (H) 05/17/2018 0421   NEUTROABS 10.6 05/17/2018 0421   LYMPHSABS 5.6 05/17/2018 0421   MONOABS 1.3 05/17/2018 0421   EOSABS 0.1 05/17/2018 0421   BASOSABS 0.1 05/17/2018 0421    No results found for this or any previous visit (from the past 240 hour(s)).  Studies/Results: No results found.  Medications: Scheduled Meds: . enoxaparin (LOVENOX) injection  30 mg Subcutaneous Q24H  . HYDROmorphone   Intravenous Q4H  . ketorolac  30 mg Intravenous Q6H  . senna-docusate  1 tablet Oral BID   Continuous Infusions: . dextrose 5 % and 0.45% NaCl 75 mL/hr at 05/18/18 0017   PRN Meds:.diphenhydrAMINE, hydrOXYzine, naloxone **AND** sodium chloride flush, ondansetron (ZOFRAN) IV, polyethylene glycol   Assessment/Plan: Active Problems:   Leukocytosis   Substance abuse (HCC)   Sickle cell anemia (HCC)   Sickle cell crisis (HCC)   Elevated liver enzymes  1. Hb Sickle Cell Disease with crisis: Continue IVF D5 .45% Saline, continue weight based Dilaudid PCA at current dose setting, Continue IV Toradol 30 mg Q 6 H, Monitor  vitals very closely, Re-evaluate pain scale regularly, 2 L of Oxygen by [place]. 2. Leukocytosis: No evidence of infection, will continue to monitor 3. Sickle Cell Anemia: Hb is at 7.8, asymptomatic. Will continue to monitor, may transfuse if Hb dropped below 6.0. Continue folic acid 4. Polysubstance Abuse: Patient tested positive for Cocaine. Patient was extensively counseled.    Code Status: Full Code Family Communication: N/A Disposition Plan: Not yet ready for discharge  Jaja Switalski  If 7PM-7AM, please contact  night-coverage.  05/18/2018, 3:59 PM  LOS: 2 days

## 2018-05-19 LAB — COMPREHENSIVE METABOLIC PANEL
ALT: 65 U/L — ABNORMAL HIGH (ref 0–44)
ANION GAP: 5 (ref 5–15)
AST: 60 U/L — ABNORMAL HIGH (ref 15–41)
Albumin: 3.3 g/dL — ABNORMAL LOW (ref 3.5–5.0)
Alkaline Phosphatase: 90 U/L (ref 38–126)
BILIRUBIN TOTAL: 3.7 mg/dL — AB (ref 0.3–1.2)
BUN: 24 mg/dL — ABNORMAL HIGH (ref 6–20)
CO2: 25 mmol/L (ref 22–32)
Calcium: 8.7 mg/dL — ABNORMAL LOW (ref 8.9–10.3)
Chloride: 106 mmol/L (ref 98–111)
Creatinine, Ser: 0.85 mg/dL (ref 0.44–1.00)
GFR calc Af Amer: >60 mL/min (ref >60)
Glucose, Bld: 102 mg/dL — ABNORMAL HIGH (ref 70–99)
POTASSIUM: 4.7 mmol/L (ref 3.5–5.1)
Sodium: 136 mmol/L (ref 135–145)
Total Protein: 6.6 g/dL (ref 6.5–8.1)

## 2018-05-19 LAB — CBC WITH DIFFERENTIAL/PLATELET
BASOS PCT: 0 %
Basophils Absolute: 0 10*3/uL (ref 0.0–0.1)
Eosinophils Absolute: 0.2 10*3/uL (ref 0.0–0.7)
Eosinophils Relative: 1 %
HEMATOCRIT: 18.1 % — AB (ref 36.0–46.0)
Hemoglobin: 6.5 g/dL — CL (ref 12.0–15.0)
LYMPHS ABS: 7.3 10*3/uL — AB (ref 0.7–4.0)
LYMPHS PCT: 45 %
MCH: 34.9 pg — ABNORMAL HIGH (ref 26.0–34.0)
MCHC: 35.9 g/dL (ref 30.0–36.0)
MCV: 97.3 fL (ref 78.0–100.0)
MONO ABS: 1 10*3/uL (ref 0.1–1.0)
Monocytes Relative: 6 %
NEUTROS PCT: 48 %
Neutro Abs: 7.7 10*3/uL (ref 1.7–7.7)
PLATELETS: 465 10*3/uL — AB (ref 150–400)
RBC: 1.86 MIL/uL — ABNORMAL LOW (ref 3.87–5.11)
RDW: 19.8 % — AB (ref 11.5–15.5)
WBC: 16.2 10*3/uL — ABNORMAL HIGH (ref 4.0–10.5)
nRBC: 13 /100 WBC — ABNORMAL HIGH

## 2018-05-19 MED ORDER — ALUM & MAG HYDROXIDE-SIMETH 200-200-20 MG/5ML PO SUSP
15.0000 mL | Freq: Four times a day (QID) | ORAL | Status: DC | PRN
  Administered 2018-05-19: 15 mL via ORAL
  Filled 2018-05-19: qty 30

## 2018-05-19 NOTE — Progress Notes (Signed)
Patient ID: [name], female   DOB: 08-14-1996, 22 y.o.   MRN: [id number] Subjective:  Patient feels slightly better today, pain is at 6-7/10, goal is 3/10. Pain is mostly in her upper limbs and lower back. No new area of pain, no joint swelling or redness. She denies fever, no chest pain, no SOB.  Objective:  Vital signs in last 24 hours:  Vitals:   05/19/18 0404 05/19/18 0602 05/19/18 0935 05/19/18 1110  BP:  (!) 110/55 (!) 99/46   Pulse:  77 87   Resp: 15 15 16 16   Temp:  98.1 F (36.7 C)    TempSrc:  Oral    SpO2: 99% 99% 96% 96%  Weight:        Intake/Output from previous day:   Intake/Output Summary (Last 24 hours) at 05/19/2018 1157 Last data filed at 05/19/2018 0948 Gross per 24 hour  Intake 600 ml  Output 1902 ml  Net -1302 ml    Physical Exam: General: Alert, awake, oriented x3, in no acute distress.  HEENT: [place]/AT PEERL, EOMI Neck: Trachea midline,  no masses, no thyromegal,y no JVD, no carotid bruit OROPHARYNX:  Moist, No exudate/ erythema/lesions.  Heart: Regular rate and rhythm, without murmurs, rubs, gallops, PMI non-displaced, no heaves or thrills on palpation.  Lungs: Clear to auscultation, no wheezing or rhonchi noted. No increased vocal fremitus resonant to percussion  Abdomen: Soft, nontender, nondistended, positive bowel sounds, no masses no hepatosplenomegaly noted..  Neuro: No focal neurological deficits noted cranial nerves II through XII grossly intact. DTRs 2+ bilaterally upper and lower extremities. Strength 5 out of 5 in bilateral upper and lower extremities. Musculoskeletal: No warm swelling or erythema around joints, no spinal tenderness noted. Psychiatric: Patient alert and oriented x3, good insight and cognition, good recent to remote recall. Lymph node survey: No cervical axillary or inguinal lymphadenopathy noted.  Lab Results:  Basic Metabolic Panel:    Component Value Date/Time   NA 136 05/19/2018 0526   K 4.7 05/19/2018  0526   CL 106 05/19/2018 0526   CO2 25 05/19/2018 0526   BUN 24 (H) 05/19/2018 0526   CREATININE 0.85 05/19/2018 0526   GLUCOSE 102 (H) 05/19/2018 0526   CALCIUM 8.7 (L) 05/19/2018 0526   CBC:    Component Value Date/Time   WBC 16.2 (H) 05/19/2018 0526   HGB 6.5 (LL) 05/19/2018 0526   HCT 18.1 (L) 05/19/2018 0526   PLT 465 (H) 05/19/2018 0526   MCV 97.3 05/19/2018 0526   NEUTROABS 7.7 05/19/2018 0526   LYMPHSABS 7.3 (H) 05/19/2018 0526   MONOABS 1.0 05/19/2018 0526   EOSABS 0.2 05/19/2018 0526   BASOSABS 0.0 05/19/2018 0526    No results found for this or any previous visit (from the past 240 hour(s)).  Studies/Results: No results found.  Medications: Scheduled Meds: . enoxaparin (LOVENOX) injection  30 mg Subcutaneous Q24H  . HYDROmorphone   Intravenous Q4H  . ketorolac  30 mg Intravenous Q6H  . senna-docusate  1 tablet Oral BID   Continuous Infusions: . dextrose 5 % and 0.45% NaCl 10 mL/hr at 05/19/18 1110   PRN Meds:.diphenhydrAMINE, hydrOXYzine, naloxone **AND** sodium chloride flush, ondansetron (ZOFRAN) IV, polyethylene glycol  Assessment/Plan: Active Problems:   Leukocytosis   Substance abuse (HCC)   Sickle cell anemia (HCC)   Sickle cell crisis (HCC)   Elevated liver enzymes  1. Hb Sickle Cell Disease with crisis: Reduce IVF to [facility], continue weight based Dilaudid PCA at current dose setting, Continue IV  Toradol 30 mg Q 6 H, Monitor vitals very closely, Re-evaluate pain scale regularly, 2 L of Oxygen by [place]. 2. Leukocytosis: No evidence of infection, will continue to monitor 3. Sickle Cell Anemia: Hb dropped to 6.5 today, patient is asymptomatic. Will continue to monitor, may transfuse if Hb dropped below 6.0. Continue folic acid. 4. Polysubstance Abuse: Patient tested positive for Cocaine. Patient was extensively counseled.   5. Elevated Liver Enzymes: Trending down, will continue to monitor.   Code Status: Full Code Family Communication: Boyfriend at  bedside Disposition Plan: Not yet ready for discharge  Denyse Fillion  If 7PM-7AM, please contact night-coverage.  05/19/2018, 11:57 AM  LOS: 3 days

## 2018-05-20 DIAGNOSIS — J4520 Mild intermittent asthma, uncomplicated: Secondary | ICD-10-CM

## 2018-05-20 DIAGNOSIS — Z720 Tobacco use: Secondary | ICD-10-CM

## 2018-05-20 LAB — CBC WITH DIFFERENTIAL/PLATELET
Basophils Absolute: 0 10*3/uL (ref 0.0–0.1)
Basophils Relative: 0 %
EOS ABS: 0.2 10*3/uL (ref 0.0–0.7)
Eosinophils Relative: 1 %
HCT: 17.9 % — ABNORMAL LOW (ref 36.0–46.0)
Hemoglobin: 6.5 g/dL — CL (ref 12.0–15.0)
LYMPHS ABS: 5.3 10*3/uL (ref 0.7–4.0)
LYMPHS PCT: 42 %
MCH: 35.1 pg — AB (ref 26.0–34.0)
MCHC: 36.3 g/dL — ABNORMAL HIGH (ref 30.0–36.0)
MCV: 96.8 fL (ref 78.0–100.0)
MONOS PCT: 9 %
Monocytes Absolute: 1.2 10*3/uL (ref 0.1–1.0)
Neutro Abs: 6.2 10*3/uL (ref 1.7–7.7)
Neutrophils Relative %: 48 %
PLATELETS: 506 10*3/uL — AB (ref 150–400)
RBC: 1.85 MIL/uL — ABNORMAL LOW (ref 3.87–5.11)
RDW: 20.4 % — AB (ref 11.5–15.5)
WBC: 12.9 10*3/uL — ABNORMAL HIGH (ref 4.0–10.5)

## 2018-05-20 MED ORDER — OXYCODONE HCL 5 MG PO TABS: 10.0000 mg | ORAL_TABLET | ORAL | Status: DC | PRN

## 2018-05-20 MED ORDER — FOLIC ACID 1 MG PO TABS: 1.0000 mg | ORAL_TABLET | Freq: Every day | ORAL | 1 refills | Status: DC

## 2018-05-20 MED ORDER — OXYCODONE HCL 5 MG PO TABS: 5.0000 mg | ORAL_TABLET | Freq: Four times a day (QID) | ORAL | 0 refills | Status: DC | PRN

## 2018-05-20 MED ORDER — HYDROMORPHONE 1 MG/ML IV SOLN
INTRAVENOUS | Status: DC
  Administered 2018-05-20: 1.8 mg via INTRAVENOUS

## 2018-05-20 MED ORDER — IBUPROFEN 600 MG PO TABS: 600.0000 mg | ORAL_TABLET | Freq: Three times a day (TID) | ORAL | 0 refills | Status: DC | PRN

## 2018-05-20 NOTE — Progress Notes (Signed)
Pt removed her own IV and was leaving the floor.I asked her where she was going and she stated "I'm leaving I've been waiting here an hour to be discharged". I said the orders were just put in and I'm going to take them off. She got on the elevator with her boyfriend and left. Charge nurse was present when patient was leaving. [place] Hollis called and informed.

## 2018-05-20 NOTE — Care Management Important Message (Signed)
Important Message  Patient Details  Name: [name] MRN: [id number] Date of Birth: 10-09-1996   Medicare Important Message Given:  Yes    Kerin Salen 05/20/2018, 11:02 AMImportant Message  Patient Details  Name: [name] MRN: [id number] Date of Birth: Oct 05, 1996   Medicare Important Message Given:  Yes    Kerin Salen 05/20/2018, 11:02 AMImportant Message  Patient Details  Name: [name] MRN: [id number] Date of Birth: 07-22-1996   Medicare Important Message Given:  Yes    Kerin Salen 05/20/2018, 11:01 AM

## 2018-05-20 NOTE — Discharge Instructions (Signed)
Friday, August 2nd at 1pm with Lanae Boast, FNP is your scheduled hospital follow up appointment.  Discussed the importance of drinking 64 ounces of water daily. The Importance of Water. To help prevent pain crises, it is important to drink plenty of water throughout the day. This is because dehydration of red blood cells may lead to the sickling process.    Continue folic acid 1 mg daily.   Sickle Cell Anemia, Adult Sickle cell anemia is a condition in which red blood cells have an abnormal sickle shape. This abnormal shape shortens the cells life span, which results in a lower than normal concentration of red blood cells in the blood. The sickle shape also causes the cells to clump together and block free blood flow through the blood vessels. As a result, the tissues and organs of the body do not receive enough oxygen. Sickle cell anemia causes organ damage and pain and increases the risk of infection. What are the causes? Sickle cell anemia is a genetic disorder. Those who receive two copies of the gene have the condition, and those who receive one copy have the trait. What increases the risk? The sickle cell gene is most common in people whose families originated in [place]. Other areas of the globe where sickle cell trait occurs include the [place], [place]. What are the signs or symptoms?  Pain, especially in the extremities, back, chest, or abdomen (common). The pain may start suddenly or may develop following an illness, especially if there is dehydration. Pain can also occur due to overexertion or exposure to extreme temperature changes.  Frequent severe bacterial infections, especially certain types of pneumonia and meningitis.  Pain and swelling in the hands and feet.  Decreased activity.  Loss of appetite.  Change in behavior.  Headaches.  Seizures.  Shortness of breath or difficulty breathing.  Vision  changes.  Skin ulcers. Those with the trait may not have symptoms or they may have mild symptoms. How is this diagnosed? Sickle cell anemia is diagnosed with blood tests that demonstrate the genetic trait. It is often diagnosed during the newborn period, due to mandatory testing nationwide. A variety of blood tests, X-rays, CT scans, MRI scans, ultrasounds, and lung function tests may also be done to monitor the condition. How is this treated? Sickle cell anemia may be treated with:  Medicines. You may be given pain medicines, antibiotic medicines (to treat and prevent infections) or medicines to increase the production of certain types of hemoglobin.  Fluids.  Oxygen.  Blood transfusions.  Follow these instructions at home:  Drink enough fluid to keep your urine clear or pale yellow. Increase your fluid intake in hot weather and during exercise.  Do not smoke. Smoking lowers oxygen levels in the blood.  Only take over-the-counter or prescription medicines for pain, fever, or discomfort as directed by your health care provider.  Take antibiotics as directed by your health care provider. Make sure you finish them it even if you start to feel better.  Take supplements as directed by your health care provider.  Consider wearing a medical alert bracelet. This tells anyone caring for you in an emergency of your condition.  When traveling, keep your medical information, health care provider's names, and the medicines you take with you at all times.  If you develop a fever, do not take medicines to reduce the fever right away. This could cover up a problem that is developing. Notify your  health care provider.  Keep all follow-up appointments with your health care provider. Sickle cell anemia requires regular medical care. Contact a health care provider if: You have a fever. Get help right away if:  You feel dizzy or faint.  You have new abdominal pain, especially on the left side  near the stomach area.  You develop a persistent, often uncomfortable and painful penile erection (priapism). If this is not treated immediately it will lead to impotence.  You have numbness your arms or legs or you have a hard time moving them.  You have a hard time with speech.  You have a fever or persistent symptoms for more than 2-3 days.  You have a fever and your symptoms suddenly get worse.  You have signs or symptoms of infection. These include: ? Chills. ? Abnormal tiredness (lethargy). ? Irritability. ? Poor eating. ? Vomiting.  You develop pain that is not helped with medicine.  You develop shortness of breath.  You have pain in your chest.  You are coughing up pus-like or bloody sputum.  You develop a stiff neck.  Your feet or hands swell or have pain.  Your abdomen appears bloated.  You develop joint pain. This information is not intended to replace advice given to you by your health care provider. Make sure you discuss any questions you have with your health care provider. Document Released: 01/24/2006 Document Revised: 05/05/2016 Document Reviewed: 05/28/2013 Elsevier Interactive Patient Education  2017 [employer].

## 2018-05-20 NOTE — Discharge Summary (Signed)
Physician Discharge Summary  Mauri Temkin Folmar UYQ:[id number] DOB: 16-Feb-1996 DOA: 05/16/2018  PCP: Patient, No Pcp Per  Admit date: 05/16/2018  Discharge date: 05/20/2018  Discharge Diagnoses:  Active Problems:   Leukocytosis   Substance abuse (HCC)   Sickle cell anemia (HCC)   Sickle cell crisis ([place])   Elevated liver enzymes   Discharge Condition: Stable  Disposition:  Follow-up Information    Lanae Boast, FNP Follow up.   Specialty:  Family Medicine Why:  Friday May 31, 2018 at 1:00 Contact information: [place] [address] [phone]          Pt is discharged home in good condition and is to follow up with Lanae Boast, FNP in 2 weeks this to have labs evaluated and for medication managment. Kadyn Chovan Quillin is instructed to increase activity slowly and balance with rest for the next few days, and use prescribed medication to complete treatment of pain  Diet: Regular Wt Readings from Last 3 Encounters:  05/18/18 144 lb 2.9 oz (65.4 kg)  03/05/18 125 lb (56.7 kg)  12/16/17 113 lb 5.1 oz (51.4 kg)    History of present illness:  [name], 22 year old female with medical history significant for sickle cell anemia, hemoglobin is, polysubstance abuse, and tobacco dependence presented to the emergency department with pain primarily to lower extremities, which is consistent with typical sickle cell crisis.  Patient states the pain started 1 day ago she has not identified any palliative or provocative factors related to current sickle cell crisis.  Patient states that she does not have a primary provider and has not been using medications for sickle cell anemia.  Pain intensity is 10/10 characterized as constant and throbbing.  Patient is afebrile.  Patient denies chest pain, dyspnea, heart palpitations, dysuria, nausea, vomiting, or diarrhea.  ED course: On admission to the emergency department patient afebrile, oxygen  saturation maintained on room air.  WBC 21.5, hemoglobin 8.2, consistent with baseline.  Pain not controlled with Dilaudid 3 mg and Toradol 30 mg x 1.  Sickle cell vomiting notified for admission.  Hospital Course:  Patient was admitted for sickle cell pain crisis.  Patient is opiate nave with hemoglobin SS and cocaine use.  Patient was admitted with sickle cell crisis and managed with custom dose PCA Dilaudid and IVF.  Dilaudid PCA was insufficient to manage pain initially and was increased.  At the time of discharge, pain intensity was 5/10.  Patient was issued a prescription for oxycodone 10 mg every 6 hours number 15 tablets to be used as needed as she has no primary provider.  An appointment was scheduled with primary care to repeat labs at discharge and for medication management. She was also managed appropriately with IVF and IV Toradol, as well as other adjunct therapies per sickle cell pain management protocols.  With regard to cocaine positive urine drug screen, patient was counseled extensively.  She refused to provide information and does not want to discuss further.  She states that she did not use it herself but has been around cocaine use.  Patient was discharged home today in a hemodynamically stable condition.   Discharge Exam: Vitals:   05/20/18 0900 05/20/18 1037  BP:  (!) 90/42  Pulse:  75  Resp: 12 16  Temp:    SpO2: 95% 92%   Vitals:   05/20/18 0544 05/20/18 0746 05/20/18 0900 05/20/18 1037  BP:  (!) 92/57  (!) 90/42  Pulse:  68  75  Resp: 14 16 12 16   Temp:  99 F (37.2 C)    TempSrc:  Oral    SpO2: 95% 100% 95% 92%  Weight:        General appearance : Awake, alert, not in any distress. Speech Clear. Not toxic looking HEENT: Atraumatic and Normocephalic, pupils equally reactive to light and accomodation Neck: Supple, no JVD. No cervical lymphadenopathy.  Chest: Good air entry bilaterally, no added sounds  CVS: S1 S2 regular, no murmurs.  Abdomen: Bowel  sounds present, Non tender and not distended with no gaurding, rigidity or rebound. Extremities: B/L Lower Ext shows no edema, both legs are warm to touch Neurology: Awake alert, and oriented X 3, CN II-XII intact, Non focal Skin: No Rash  Discharge Instructions  Discharge Instructions    Discharge patient   Complete by:  As directed    Discharge disposition:  01-Home or Self Care   Discharge patient date:  05/20/2018     Allergies as of 05/20/2018   No Known Allergies     Medication List    TAKE these medications   folic acid 1 MG tablet Commonly known as:  FOLVITE Take 1 tablet (1 mg total) by mouth daily.   ibuprofen 600 MG tablet Commonly known as:  ADVIL,MOTRIN Take 1 tablet (600 mg total) by mouth every 8 (eight) hours as needed. What changed:  when to take this   oxyCODONE 5 MG immediate release tablet Commonly known as:  Oxy IR/ROXICODONE Take 1 tablet (5 mg total) by mouth every 6 (six) hours as needed for moderate pain or severe pain.       The results of significant diagnostics from this hospitalization (including imaging, microbiology, ancillary and laboratory) are listed below for reference.    Significant Diagnostic Studies: No results found.  Microbiology: No results found for this or any previous visit (from the past 240 hour(s)).   Labs: Basic Metabolic Panel: Recent Labs  Lab 05/16/18 0327  05/17/18 0421 05/19/18 0526  NA 137  --  138 136  K 5.6*   < > 4.4 4.7  CL 107  --  105 106  CO2 24  --  27 25  GLUCOSE 107*  --  98 102*  BUN 16  --  15 24*  CREATININE 0.68  --  0.86 0.85  CALCIUM 8.8*  --  9.0 8.7*   < > = values in this interval not displayed.   Liver Function Tests: Recent Labs  Lab 05/17/18 0421 05/19/18 0526  AST 73* 60*  ALT 62* 65*  ALKPHOS 81 90  BILITOT 6.5* 3.7*  PROT 7.4 6.6  ALBUMIN 4.0 3.3*   No results for input(s): LIPASE, AMYLASE in the last 168 hours. No results for input(s): AMMONIA in the last 168  hours. CBC: Recent Labs  Lab 05/16/18 0327 05/17/18 0421 05/19/18 0526 05/20/18 0511  WBC 21.5* 17.6* 16.2* 12.9*  NEUTROABS 12.3* 10.6 7.7 6.2  HGB 8.2* 7.8* 6.5* 6.5*  HCT 22.6* 21.8* 18.1* 17.9*  MCV 101.3* 100.9* 97.3 96.8  PLT 490* 565* 465* 506*   Cardiac Enzymes: No results for input(s): CKTOTAL, CKMB, CKMBINDEX, TROPONINI in the last 168 hours. BNP: Invalid input(s): POCBNP CBG: No results for input(s): GLUCAP in the last 168 hours.  Time coordinating discharge: 50 minutes  Signed:  Donia Pounds  MSN, FNP-C [facility] [address] [place], [place] [address] [phone]  [facility] 05/20/2018, 12:10 PM

## 2018-05-31 ENCOUNTER — Ambulatory Visit: Payer: Medicare Other | Admitting: Family Medicine

## 2018-09-06 ENCOUNTER — Other Ambulatory Visit: Payer: Self-pay

## 2018-09-06 ENCOUNTER — Encounter (HOSPITAL_COMMUNITY): Payer: Self-pay | Admitting: Obstetrics and Gynecology

## 2018-09-06 ENCOUNTER — Inpatient Hospital Stay (HOSPITAL_COMMUNITY)
Admission: EM | Admit: 2018-09-06 | Discharge: 2018-09-11 | DRG: 811 | Disposition: A | Discharge status: Home / Self Care | Payer: Medicare Other | Attending: Internal Medicine | Admitting: Internal Medicine

## 2018-09-06 DIAGNOSIS — D72829 Elevated white blood cell count, unspecified: Secondary | ICD-10-CM | POA: Diagnosis present

## 2018-09-06 DIAGNOSIS — Z5900 Homelessness unspecified: Secondary | ICD-10-CM

## 2018-09-06 DIAGNOSIS — D571 Sickle-cell disease without crisis: Secondary | ICD-10-CM | POA: Diagnosis present

## 2018-09-06 DIAGNOSIS — Z9081 Acquired absence of spleen: Secondary | ICD-10-CM

## 2018-09-06 DIAGNOSIS — F1410 Cocaine abuse, uncomplicated: Secondary | ICD-10-CM | POA: Diagnosis present

## 2018-09-06 DIAGNOSIS — Z6821 Body mass index (BMI) 21.0-21.9, adult: Secondary | ICD-10-CM

## 2018-09-06 DIAGNOSIS — K219 Gastro-esophageal reflux disease without esophagitis: Secondary | ICD-10-CM | POA: Diagnosis present

## 2018-09-06 DIAGNOSIS — D72823 Leukemoid reaction: Secondary | ICD-10-CM

## 2018-09-06 DIAGNOSIS — J45909 Unspecified asthma, uncomplicated: Secondary | ICD-10-CM | POA: Diagnosis present

## 2018-09-06 DIAGNOSIS — D638 Anemia in other chronic diseases classified elsewhere: Secondary | ICD-10-CM | POA: Diagnosis present

## 2018-09-06 DIAGNOSIS — Z23 Encounter for immunization: Secondary | ICD-10-CM | POA: Diagnosis not present

## 2018-09-06 DIAGNOSIS — Z832 Family history of diseases of the blood and blood-forming organs and certain disorders involving the immune mechanism: Secondary | ICD-10-CM

## 2018-09-06 DIAGNOSIS — F17210 Nicotine dependence, cigarettes, uncomplicated: Secondary | ICD-10-CM | POA: Diagnosis present

## 2018-09-06 DIAGNOSIS — D5700 Hb-SS disease with crisis, unspecified: Principal | ICD-10-CM | POA: Diagnosis present

## 2018-09-06 DIAGNOSIS — M79602 Pain in left arm: Secondary | ICD-10-CM | POA: Diagnosis present

## 2018-09-06 DIAGNOSIS — F1910 Other psychoactive substance abuse, uncomplicated: Secondary | ICD-10-CM | POA: Diagnosis present

## 2018-09-06 DIAGNOSIS — G894 Chronic pain syndrome: Secondary | ICD-10-CM | POA: Diagnosis present

## 2018-09-06 DIAGNOSIS — E43 Unspecified severe protein-calorie malnutrition: Secondary | ICD-10-CM | POA: Diagnosis present

## 2018-09-06 LAB — COMPREHENSIVE METABOLIC PANEL
ALBUMIN: 4.2 g/dL (ref 3.5–5.0)
ALK PHOS: 60 U/L (ref 38–126)
ALT: 40 U/L (ref 0–44)
AST: 28 U/L (ref 15–41)
Anion gap: 7 (ref 5–15)
BILIRUBIN TOTAL: 4.3 mg/dL — AB (ref 0.3–1.2)
BUN: 9 mg/dL (ref 6–20)
CO2: 26 mmol/L (ref 22–32)
Calcium: 8.9 mg/dL (ref 8.9–10.3)
Chloride: 103 mmol/L (ref 98–111)
Creatinine, Ser: 0.95 mg/dL (ref 0.44–1.00)
GFR calc Af Amer: >60 mL/min (ref >60)
GFR calc non Af Amer: >60 mL/min (ref >60)
GLUCOSE: 76 mg/dL (ref 70–99)
Potassium: 3.8 mmol/L (ref 3.5–5.1)
Sodium: 136 mmol/L (ref 135–145)
TOTAL PROTEIN: 7.5 g/dL (ref 6.5–8.1)

## 2018-09-06 LAB — CBC WITH DIFFERENTIAL/PLATELET
Abs Immature Granulocytes: 0.06 10*3/uL (ref 0.00–0.07)
BASOS ABS: 0 10*3/uL (ref 0.0–0.1)
Basophils Relative: 0 %
EOS ABS: 0.1 10*3/uL (ref 0.0–0.5)
Eosinophils Relative: 1 %
HCT: 23.7 % — ABNORMAL LOW (ref 36.0–46.0)
Hemoglobin: 8.2 g/dL — ABNORMAL LOW (ref 12.0–15.0)
IMMATURE GRANULOCYTES: 1 %
LYMPHS ABS: 5 10*3/uL — AB (ref 0.7–4.0)
Lymphocytes Relative: 42 %
MCH: 36 pg — AB (ref 26.0–34.0)
MCHC: 34.6 g/dL (ref 30.0–36.0)
MCV: 103.9 fL — ABNORMAL HIGH (ref 80.0–100.0)
MONO ABS: 1.1 10*3/uL — AB (ref 0.1–1.0)
MONOS PCT: 9 %
Neutro Abs: 5.7 10*3/uL (ref 1.7–7.7)
Neutrophils Relative %: 47 %
PLATELETS: 563 10*3/uL — AB (ref 150–400)
RBC: 2.28 MIL/uL — ABNORMAL LOW (ref 3.87–5.11)
RDW: 18.9 % — ABNORMAL HIGH (ref 11.5–15.5)
WBC: 12 10*3/uL — ABNORMAL HIGH (ref 4.0–10.5)
nRBC: 3.3 % — ABNORMAL HIGH (ref 0.0–0.2)

## 2018-09-06 LAB — RETICULOCYTES
Immature Retic Fract: 30.4 % — ABNORMAL HIGH (ref 2.3–15.9)
RBC.: 2.28 MIL/uL — AB (ref 3.87–5.11)
RETIC CT PCT: 15.8 % — AB (ref 0.4–3.1)
Retic Count, Absolute: 359.6 10*3/uL — ABNORMAL HIGH (ref 19.0–186.0)

## 2018-09-06 LAB — I-STAT BETA HCG BLOOD, ED (MC, WL, AP ONLY): I-stat hCG, quantitative: <5 m[IU]/mL (ref <5)

## 2018-09-06 MED ORDER — HYDROMORPHONE HCL 2 MG/ML IJ SOLN
2.0000 mg | INTRAMUSCULAR | Status: AC
  Administered 2018-09-06: 2 mg via SUBCUTANEOUS
  Filled 2018-09-06: qty 1

## 2018-09-06 MED ORDER — HYDROMORPHONE 1 MG/ML IV SOLN
INTRAVENOUS | Status: DC
  Administered 2018-09-06: 0.8 mg via INTRAVENOUS
  Administered 2018-09-06: 21:00:00 via INTRAVENOUS
  Administered 2018-09-07: 1.2 mg via INTRAVENOUS
  Administered 2018-09-07: 0.9 mg via INTRAVENOUS
  Administered 2018-09-07: 1.8 mg via INTRAVENOUS
  Administered 2018-09-07: 2.1 mg via INTRAVENOUS
  Administered 2018-09-07: 1.8 mg via INTRAVENOUS
  Administered 2018-09-08: 3.3 mg via INTRAVENOUS
  Administered 2018-09-08: 1.8 mg via INTRAVENOUS
  Administered 2018-09-08: 18:00:00 via INTRAVENOUS
  Administered 2018-09-08: 2.9 mg via INTRAVENOUS
  Administered 2018-09-08: 2.4 mg via INTRAVENOUS
  Administered 2018-09-08 – 2018-09-09 (×2): 1.8 mg via INTRAVENOUS
  Administered 2018-09-09: 4.5 mg via INTRAVENOUS
  Administered 2018-09-09: 2.7 mg via INTRAVENOUS
  Administered 2018-09-09: 2.8 mg via INTRAVENOUS
  Administered 2018-09-09: 2.7 mg via INTRAVENOUS
  Administered 2018-09-09: 2.4 mg via INTRAVENOUS
  Administered 2018-09-09: 0.9 mg via INTRAVENOUS
  Administered 2018-09-10: 3 mg via INTRAVENOUS
  Administered 2018-09-10: 30 mg via INTRAVENOUS
  Administered 2018-09-10: 1.6 mg via INTRAVENOUS
  Administered 2018-09-10: 2.7 mg via INTRAVENOUS
  Administered 2018-09-10 (×2): 2.1 mg via INTRAVENOUS
  Administered 2018-09-10: 2.8 mg via INTRAVENOUS
  Administered 2018-09-11: 0.6 mg via INTRAVENOUS
  Administered 2018-09-11: 1.8 mg via INTRAVENOUS
  Filled 2018-09-06 (×4): qty 25

## 2018-09-06 MED ORDER — SODIUM CHLORIDE 0.9 % IV SOLN
25.0000 mg | INTRAVENOUS | Status: DC | PRN
  Filled 2018-09-06: qty 0.5

## 2018-09-06 MED ORDER — KETOROLAC TROMETHAMINE 30 MG/ML IJ SOLN
30.0000 mg | INTRAMUSCULAR | Status: AC
  Administered 2018-09-06: 30 mg via INTRAVENOUS
  Filled 2018-09-06: qty 1

## 2018-09-06 MED ORDER — HYDROMORPHONE HCL 2 MG/ML IJ SOLN
2.0000 mg | INTRAMUSCULAR | Status: AC
  Administered 2018-09-06: 2 mg via INTRAVENOUS

## 2018-09-06 MED ORDER — DIPHENHYDRAMINE HCL 50 MG/ML IJ SOLN
12.5000 mg | Freq: Four times a day (QID) | INTRAMUSCULAR | Status: DC | PRN
  Administered 2018-09-06 – 2018-09-07 (×2): 12.5 mg via INTRAVENOUS
  Filled 2018-09-06 (×2): qty 1

## 2018-09-06 MED ORDER — HYDROMORPHONE HCL 2 MG/ML IJ SOLN: 2.0000 mg | INTRAMUSCULAR | Status: AC

## 2018-09-06 MED ORDER — DIPHENHYDRAMINE HCL 50 MG/ML IJ SOLN
25.0000 mg | Freq: Once | INTRAMUSCULAR | Status: AC
  Administered 2018-09-06: 25 mg via INTRAVENOUS
  Filled 2018-09-06: qty 1

## 2018-09-06 MED ORDER — HEPARIN SODIUM (PORCINE) 5000 UNIT/ML IJ SOLN
5000.0000 [IU] | Freq: Three times a day (TID) | INTRAMUSCULAR | Status: DC
  Administered 2018-09-07 – 2018-09-09 (×6): 5000 [IU] via SUBCUTANEOUS
  Filled 2018-09-06 (×8): qty 1

## 2018-09-06 MED ORDER — HYDROXYZINE HCL 25 MG PO TABS
25.0000 mg | ORAL_TABLET | Freq: Three times a day (TID) | ORAL | Status: DC | PRN
  Administered 2018-09-06: 25 mg via ORAL
  Filled 2018-09-06: qty 1

## 2018-09-06 MED ORDER — ONDANSETRON HCL 4 MG/2ML IJ SOLN: 4.0000 mg | Freq: Four times a day (QID) | INTRAMUSCULAR | Status: DC | PRN

## 2018-09-06 MED ORDER — HYDROXYZINE HCL 25 MG PO TABS: 25.0000 mg | ORAL_TABLET | Freq: Three times a day (TID) | ORAL | Status: DC | PRN

## 2018-09-06 MED ORDER — DIPHENHYDRAMINE HCL 12.5 MG/5ML PO ELIX
12.5000 mg | ORAL_SOLUTION | Freq: Four times a day (QID) | ORAL | Status: DC | PRN
  Administered 2018-09-10: 12.5 mg via ORAL
  Filled 2018-09-06: qty 5

## 2018-09-06 MED ORDER — SODIUM CHLORIDE 0.45 % IV SOLN
INTRAVENOUS | Status: AC
  Administered 2018-09-06: 18:00:00 via INTRAVENOUS

## 2018-09-06 MED ORDER — NALOXONE HCL 0.4 MG/ML IJ SOLN: 0.4000 mg | INTRAMUSCULAR | Status: DC | PRN

## 2018-09-06 MED ORDER — POLYETHYLENE GLYCOL 3350 17 G PO PACK
17.0000 g | PACK | Freq: Every day | ORAL | Status: DC
  Administered 2018-09-06: 17 g via ORAL
  Filled 2018-09-06 (×3): qty 1

## 2018-09-06 MED ORDER — INFLUENZA VAC SPLIT QUAD 0.5 ML IM SUSY
0.5000 mL | PREFILLED_SYRINGE | INTRAMUSCULAR | Status: AC
  Administered 2018-09-09: 0.5 mL via INTRAMUSCULAR
  Filled 2018-09-06 (×3): qty 0.5

## 2018-09-06 MED ORDER — DIPHENHYDRAMINE HCL 25 MG PO CAPS
25.0000 mg | ORAL_CAPSULE | Freq: Once | ORAL | Status: AC
Start: 2018-09-06 — End: 2018-09-06
  Administered 2018-09-06: 25 mg via ORAL
  Filled 2018-09-06: qty 1

## 2018-09-06 MED ORDER — SODIUM CHLORIDE 0.9% FLUSH: 9.0000 mL | INTRAVENOUS | Status: DC | PRN

## 2018-09-06 MED ORDER — KETOROLAC TROMETHAMINE 15 MG/ML IJ SOLN
15.0000 mg | Freq: Four times a day (QID) | INTRAMUSCULAR | Status: DC | PRN
  Administered 2018-09-08 – 2018-09-10 (×5): 15 mg via INTRAVENOUS
  Filled 2018-09-06 (×5): qty 1

## 2018-09-06 MED ORDER — SODIUM CHLORIDE 0.45 % IV SOLN
INTRAVENOUS | Status: DC
  Administered 2018-09-06: 21:00:00 via INTRAVENOUS

## 2018-09-06 MED ORDER — HYDROMORPHONE HCL 2 MG/ML IJ SOLN
2.0000 mg | INTRAMUSCULAR | Status: AC
  Filled 2018-09-06: qty 1

## 2018-09-06 MED ORDER — ONDANSETRON HCL 4 MG PO TABS: 4.0000 mg | ORAL_TABLET | Freq: Four times a day (QID) | ORAL | Status: DC | PRN

## 2018-09-06 MED ORDER — ONDANSETRON HCL 4 MG/2ML IJ SOLN
4.0000 mg | Freq: Four times a day (QID) | INTRAMUSCULAR | Status: DC | PRN
  Filled 2018-09-06: qty 2

## 2018-09-06 MED ORDER — HYDROMORPHONE HCL 2 MG/ML IJ SOLN
2.0000 mg | INTRAMUSCULAR | Status: AC
  Administered 2018-09-06: 2 mg via INTRAVENOUS
  Filled 2018-09-06: qty 1

## 2018-09-06 NOTE — H&P (Signed)
History and Physical    [name] [name]:[id number] DOB: Aug 24, 1996 DOA: 09/06/2018  PCP: Patient, No Pcp Per  Patient coming from: home    Chief Complaint: Acute pain crisis  HPI: [name] is a 22 y.o. female with medical history significant of of sickle cell disease, crack cocaine use last time she used crack cocaine was 2 days prior to admission, homeless discharged from the hospital on 05/20/2018 for sickle cell crisis comes in for left shoulder pain and bilateral lower extremity pain that began yesterday, she relates it feels like her sickle cell crisis pain she relates is at 10 out of 10 she has not taking anything for pain.  She denies any fever, chills, nausea, vomiting, chest pain, shortness of breath, abdominal pain, burning when she urinates.  She relates her pain is not alleviated or aggravated by anything.  ED Course:  Was found to be afebrile with a white count of 12  Review of Systems: As per HPI otherwise 10 point review of systems negative.    Past Medical History:  Diagnosis Date  . Acute kidney injury ([place]) 05/15/2016  . Amenorrhea   . Anemia    SICKLE CELL  . Asthma   . Drug-induced pruritus 02/26/2015  . GERD (gastroesophageal reflux disease)   . Headache   . Hyperbilirubinemia 02/26/2015  . Sickle cell disease ([place])     Past Surgical History:  Procedure Laterality Date  . 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 TONSILLECTOMY     Age 73     reports that she has been smoking cigarettes. She has a 5.00 pack-year smoking history. She has never used smokeless tobacco. She reports that she has current or past drug history. Drugs: Marijuana and Cocaine. She reports that she does not drink alcohol.  No Known Allergies  Family History  Problem Relation Age of Onset  . Hypertension Paternal Grandfather   . Sickle cell trait Father   . Cancer Mother        Died in 2009/01/24     Prior to Admission medications   Medication Sig Start Date  End Date Taking? Authorizing Provider  folic acid (FOLVITE) 1 MG tablet Take 1 tablet (1 mg total) by mouth daily. Patient not taking: Reported on 09/06/2018 05/20/18 05/20/19  Dorena Dew, FNP  ibuprofen (ADVIL,MOTRIN) 600 MG tablet Take 1 tablet (600 mg total) by mouth every 8 (eight) hours as needed. Patient not taking: Reported on 09/06/2018 05/20/18   Dorena Dew, FNP  oxyCODONE (OXY IR/ROXICODONE) 5 MG immediate release tablet Take 1 tablet (5 mg total) by mouth every 6 (six) hours as needed for moderate pain or severe pain. Patient not taking: Reported on 09/06/2018 05/20/18   Dorena Dew, FNP    Physical Exam: Vitals:   09/06/18 1255 09/06/18 1327 09/06/18 1330  BP: 111/71 113/66 108/66  Pulse: 100 82 87  Resp: 19    Temp: 98.7 F (37.1 C)    TempSrc: Oral    SpO2: 100% 98% 99%    Constitutional: NAD, calm, comfortable Vitals:   09/06/18 1255 09/06/18 1327 09/06/18 1330  BP: 111/71 113/66 108/66  Pulse: 100 82 87  Resp: 19    Temp: 98.7 F (37.1 C)    TempSrc: Oral    SpO2: 100% 98% 99%   Eyes: PERRL, lids and conjunctivae normal ENMT: Mucous membranes are moist. Posterior pharynx clear of any exudate or lesions.Normal dentition.  Neck: normal, supple, no masses,  no thyromegaly Respiratory: clear to auscultation bilaterally, no wheezing, no crackles. Normal respiratory effort. No accessory muscle use.  Cardiovascular: Regular rate and rhythm, no murmurs / rubs / gallops. No extremity edema. 2+ pedal pulses. No carotid bruits.  Abdomen: no tenderness, no masses palpated. No hepatosplenomegaly. Bowel sounds positive.  Musculoskeletal: Examination of her right shoulder reveals no cuts or sores, intact movement of her left shoulder, she has bilateral knee movement with no swelling erythema or joint swelling able to move her knees without any difficulties. Skin: no rashes, lesions, ulcers. No induration Neurologic: CN 2-12 grossly intact. Sensation intact, DTR  normal. Strength 5/5 in all 4.  Psychiatric: Normal judgment and insight. Alert and oriented x 3. Normal mood.    Labs on Admission: I have personally reviewed following labs and imaging studies  CBC: Recent Labs  Lab 09/06/18 1556  WBC 12.0*  NEUTROABS 5.7  HGB 8.2*  HCT 23.7*  MCV 103.9*  PLT 578*   Basic Metabolic Panel: Recent Labs  Lab 09/06/18 1556  NA 136  K 3.8  CL 103  CO2 26  GLUCOSE 76  BUN 9  CREATININE 0.95  CALCIUM 8.9   GFR: CrCl cannot be calculated (Unknown ideal weight.). Liver Function Tests: Recent Labs  Lab 09/06/18 1556  AST 28  ALT 40  ALKPHOS 60  BILITOT 4.3*  PROT 7.5  ALBUMIN 4.2   No results for input(s): LIPASE, AMYLASE in the last 168 hours. No results for input(s): AMMONIA in the last 168 hours. Coagulation Profile: No results for input(s): INR, PROTIME in the last 168 hours. Cardiac Enzymes: No results for input(s): CKTOTAL, CKMB, CKMBINDEX, TROPONINI in the last 168 hours. BNP (last 3 results) No results for input(s): PROBNP in the last 8760 hours. HbA1C: No results for input(s): HGBA1C in the last 72 hours. CBG: No results for input(s): GLUCAP in the last 168 hours. Lipid Profile: No results for input(s): CHOL, HDL, LDLCALC, TRIG, CHOLHDL, LDLDIRECT in the last 72 hours. Thyroid Function Tests: No results for input(s): TSH, T4TOTAL, FREET4, T3FREE, THYROIDAB in the last 72 hours. Anemia Panel: Recent Labs    09/06/18 1556  RETICCTPCT 15.8*   Urine analysis:    Component Value Date/Time   COLORURINE YELLOW 12/17/2017 1530   APPEARANCEUR CLEAR 12/17/2017 1530   LABSPEC 1.009 12/17/2017 1530   PHURINE 6.0 12/17/2017 1530   GLUCOSEU NEGATIVE 12/17/2017 1530   HGBUR NEGATIVE 12/17/2017 1530   BILIRUBINUR NEGATIVE 12/17/2017 1530   KETONESUR NEGATIVE 12/17/2017 1530   PROTEINUR NEGATIVE 12/17/2017 1530   UROBILINOGEN 1.0 08/11/2015 1948   NITRITE NEGATIVE 12/17/2017 1530   LEUKOCYTESUR NEGATIVE 12/17/2017 1530      Radiological Exams on Admission: No results found.  EKG: Independently reviewed. None  Assessment/Plan Active Problems:   Sickle cell pain crisis (HCC)   Leukocytosis   Protein-calorie malnutrition, severe   Homelessness   Substance abuse ([place])   Sickle cell anemia ([place]) Admit her to MedSurg unit, will start her on IV Dilaudid every 2 hours and a PCA pump. We will give her a bolus of half-normal saline and continue half-normal saline at 125 cc an hour monitor strict I's and O's, use Benadryl and Atarax for itching.  Zofran for nausea and vomiting. Is a mild leukocytosis but has remained afebrile she denies any fevers at home likely stress demargination. Check a UDS she does admit to crack cocaine use at home.  DVT prophylaxis: heparin Code Status: full Family Communication: none Disposition Plan:  Consults called: none  Admission status: Inpatient  It is my clinical opinion that admission to INPATIENT is reasonable and necessary in this 22 y.o. female medical history of sickle cell disease who comes in with pain that she relates is similar to her sickle cell pain crisis, she has uncontrollable pain not able to stay still, she has remained afebrile with no leukocytosis no physical findings of fracture or dislocation in her joints no effusion no cuts or sores in her skin.  She has a mild leukocytosis which is probably stress demargination.  Given the aforementioned, the predictability of an adverse outcome is felt to be significant. I expect that the patient will require at least 2 midnights in the hospital to treat this condition.  Charlynne Cousins MD [facility]   If 7PM-7AM, please contact night-coverage [email or web address] Password [facility]  09/06/2018, 5:26 PM

## 2018-09-06 NOTE — ED Provider Notes (Signed)
[facility] DEPT Provider Note   CSN: [id number] Arrival date & time: 09/06/18  28-Jan-1244   History   Chief Complaint Chief Complaint  Patient presents with  . Sickle Cell Pain Crisis    HPI DEZERAE FREIBERGER is a 22 y.o. female with a past medical history significant for sickle cell crisis, polysubstance abuse who presents for evaluation of sickle cell crisis. Pain began yesterday evening. Pain is located to the bilateral upper and lower extremities.  Rates her pain a 10/10.  Denies fever, chills, Nausea, vomiting, chest pain, SOB, abdominal pain. States her pain is her typical "sickle cell crisis pain."  Denies aggravating or alleviating factors.  Denies taking anything for her pain.  Patient states she does occasionally use crack cocaine.  Last use approximately 2 days ago.  History obtained from patient.  No interpreter was used.  HPI  Past Medical History:  Diagnosis Date  . Acute kidney injury ([place]) 05/15/2016  . Amenorrhea   . Anemia    SICKLE CELL  . Asthma   . Drug-induced pruritus 02/26/2015  . GERD (gastroesophageal reflux disease)   . Headache   . Hyperbilirubinemia 02/26/2015  . Sickle cell disease [facility])     Patient Active Problem List   Diagnosis Date Noted  . Elevated liver enzymes 05/17/2018  . Sickle cell anemia ([place]) 05/16/2018  . Sickle cell crisis ([place]) 05/16/2018  . Sickle cell anemia with crisis ([place]) 12/16/2017  . Substance abuse ([place]) 12/16/2017  . Homelessness 02/12/2017  . Protein-calorie malnutrition, severe 10/21/2016  . Anemia due to other cause   . Hb-SS disease without crisis ([place])   . 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 Tobacco abuse   . Leukocytosis   . Sickle cell pain crisis ([place]) 02/15/2013  . Abdominal pain 07/25/2012  . Asthma, mild intermittent 12/14/2010    Past Surgical History:  Procedure Laterality Date  . SPLENECTOMY     Age  70 for sequestration  . TONSILLECTOMY     Age 9     OB History   None      Home Medications    Prior to Admission medications   Medication Sig Start Date End Date Taking? Authorizing Provider  folic acid (FOLVITE) 1 MG tablet Take 1 tablet (1 mg total) by mouth daily. Patient not taking: Reported on 09/06/2018 05/20/18 05/20/19  Dorena Dew, FNP  ibuprofen (ADVIL,MOTRIN) 600 MG tablet Take 1 tablet (600 mg total) by mouth every 8 (eight) hours as needed. Patient not taking: Reported on 09/06/2018 05/20/18   Dorena Dew, FNP  oxyCODONE (OXY IR/ROXICODONE) 5 MG immediate release tablet Take 1 tablet (5 mg total) by mouth every 6 (six) hours as needed for moderate pain or severe pain. Patient not taking: Reported on 09/06/2018 05/20/18   Dorena Dew, FNP    Family History Family History  Problem Relation Age of Onset  . Hypertension Paternal Grandfather   . Sickle cell trait Father   . Cancer Mother        Died in Jan 27, 2009    Social History Social History   Tobacco Use  . Smoking status: Current Every Day Smoker    Packs/day: 0.50    Types: Cigarettes  . Smokeless tobacco: Never Used  Substance Use Topics  . Alcohol use: No    Alcohol/week: 6.0 standard drinks    Types: 6 Shots of liquor per week    Comment:  Denies 06/12/2014  . Drug use: Yes    Types: Marijuana, Cocaine     Allergies   Patient has no known allergies.   Review of Systems Review of Systems  Constitutional: Negative.   HENT: Negative.   Respiratory: Negative.   Cardiovascular: Negative.   Gastrointestinal: Negative.   Genitourinary: Negative.   Musculoskeletal: Positive for arthralgias. Negative for back pain, neck pain and neck stiffness.  Skin: Negative.   All other systems reviewed and are negative.    Physical Exam Updated Vital Signs BP 108/66   Pulse 87   Temp 98.7 F (37.1 C) (Oral)   Resp 19   LMP 08/06/2018 (Approximate)   SpO2 99%   Physical Exam    Constitutional: She appears well-developed and well-nourished. No distress.  HENT:  Head: Atraumatic.  Eyes: Pupils are equal, round, and reactive to light.  Neck: Normal range of motion.  Cardiovascular: Normal rate, regular rhythm, normal heart sounds and intact distal pulses. Exam reveals no friction rub.  No murmur heard. Pulmonary/Chest: Effort normal and breath sounds normal. No stridor. No respiratory distress. She has no wheezes. She has no rales.  Abdominal: Soft. Bowel sounds are normal. She exhibits no distension and no mass. There is no tenderness. There is no guarding.  Musculoskeletal: Normal range of motion. She exhibits no edema or deformity.  Tenderness to bilateral upper extremity at joints.  Full range of motion without difficulty.  Neurological: She is alert.  Bilateral extremities intact sensation.  Skin: Skin is warm and dry. She is not diaphoretic.  No edema, erythema, ecchymosis or warmth to bilateral upper or lower extremity.  Psychiatric: She has a normal mood and affect.  Nursing note and vitals reviewed.    ED Treatments / Results  Labs (all labs ordered are listed, but only abnormal results are displayed) Labs Reviewed  COMPREHENSIVE METABOLIC PANEL  CBC WITH DIFFERENTIAL/PLATELET  RETICULOCYTES  I-STAT BETA HCG BLOOD, ED ([facility], [facility], [facility] ONLY)    EKG None  Radiology No results found.  Procedures Procedures (including critical care time)  Medications Ordered in ED Medications  HYDROmorphone (DILAUDID) injection 2 mg (has no administration in time range)    Or  HYDROmorphone (DILAUDID) injection 2 mg (has no administration in time range)  HYDROmorphone (DILAUDID) injection 2 mg (has no administration in time range)    Or  HYDROmorphone (DILAUDID) injection 2 mg (has no administration in time range)  HYDROmorphone (DILAUDID) injection 2 mg (has no administration in time range)    Or  HYDROmorphone (DILAUDID) injection 2 mg (has no  administration in time range)  HYDROmorphone (DILAUDID) injection 2 mg (has no administration in time range)    Or  HYDROmorphone (DILAUDID) injection 2 mg (has no administration in time range)  diphenhydrAMINE (BENADRYL) injection 25 mg (has no administration in time range)  ketorolac (TORADOL) 30 MG/ML injection 30 mg (has no administration in time range)     Initial Impression / Assessment and Plan / ED Course  I have reviewed the triage vital signs and the nursing notes.  Pertinent labs & imaging results that were available during my care of the patient were reviewed by me and considered in my medical decision making (see chart for details).  22 year old female who appears otherwise well presents for evaluation of sickle cell pain crisis.  Patient states his pain is typical for sickle cell crisis pain.  Pain is located to her upper extremities.  Afebrile, nonseptic, non-ill-appearing.  Normal musculoskeletal exam.  Neurovascularly intact.  Low suspicion for acute chest syndrome as she has no chest pain and no shortness of breath.  Will obtain labs, pain medicine and reevaluate.  WBC at 12.0.  Elevated retics at 353, metabolic panel Negative, HCG negative.   On Reevaluation after multiple doses of pain medicine patient with continued 10/10 pain.  Hemodynamically stable.  Will consult to hospitalist for admission for pain management.  Consulted with Dr. Venetia Constable with [facility].  He agrees for admission of patient.   Final Clinical Impressions(s) / ED Diagnoses   Final diagnoses:  None    ED Discharge Orders    None       Ashten Sarnowski A, PA-C 09/06/18 2312    Orlie Dakin, MD 09/07/18 [phone]

## 2018-09-06 NOTE — ED Provider Notes (Signed)
Complains of left arm pain and bilateral knee pain for the past 2 days typical of her sickle cell pain.  She denies any fever denies chest pain denies shortness of breath.  She last used crack cocaine by smoking it 2 days ago.  She denies any history of IV drug use.   Orlie Dakin, MD 09/06/18 [phone]

## 2018-09-06 NOTE — ED Triage Notes (Signed)
Pt reports she is here for [facility]. Pt reports she is homeless and does not take home medications but she does use crack. Pt reports she is having pain in her arms.

## 2018-09-06 NOTE — ED Provider Notes (Signed)
[facility] DEPT Provider Note   CSN: [id number] Arrival date & time: 09/06/18  02-22-44  History   Chief Complaint Chief Complaint  Patient presents with  . Sickle Cell Pain Crisis   HPI [name] is a 22 y.o. female with a past medical history significant for sickle cell anemia, homelessness who presents for evaluation of sickle cell crisis.  Patient states her pain began yesterday evening.  Pain is rated a 10/10.  Does not take anything for pain.  Patient states she uses crack cocaine to resolve her pain.  Last use approximately 2 days ago. Describes her pain as aching.  States her pain is of typical sickle cell crisis pain.  Has not taken OTC medications for her pain.  Denies fever, chills, nausea, vomiting, chest pain, shortness of breath, abdominal pain, dysuria.  Denies aggravating or alleviating symptoms.  History obtained from patient.  No interpreter was used.  HPI  Past Medical History:  Diagnosis Date  . Acute kidney injury ([place]) 05/15/2016  . Amenorrhea   . Anemia    SICKLE CELL  . Asthma   . Drug-induced pruritus 02/26/2015  . GERD (gastroesophageal reflux disease)   . Headache   . Hyperbilirubinemia 02/26/2015  . Sickle cell disease [facility])     Patient Active Problem List   Diagnosis Date Noted  . Elevated liver enzymes 05/17/2018  . Sickle cell anemia ([place]) 05/16/2018  . Sickle cell crisis ([place]) 05/16/2018  . Sickle cell anemia with crisis ([place]) 12/16/2017  . Substance abuse ([place]) 12/16/2017  . Homelessness 02/12/2017  . Protein-calorie malnutrition, severe 10/21/2016  . Anemia due to other cause   . Hb-SS disease without crisis ([place])   . Hereditary hemolytic anemia ([place])   . 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 Tobacco abuse   . Leukocytosis   . Sickle cell pain crisis ([place]) 02/15/2013  . Abdominal pain 07/25/2012  . Asthma, mild intermittent 12/14/2010    Past Surgical History:    Procedure Laterality Date  . SPLENECTOMY     Age 38 for sequestration  . TONSILLECTOMY     Age 73     OB History   None      Home Medications    Prior to Admission medications   Medication Sig Start Date End Date Taking? Authorizing Provider  folic acid (FOLVITE) 1 MG tablet Take 1 tablet (1 mg total) by mouth daily. Patient not taking: Reported on 09/06/2018 05/20/18 05/20/19  Dorena Dew, FNP  ibuprofen (ADVIL,MOTRIN) 600 MG tablet Take 1 tablet (600 mg total) by mouth every 8 (eight) hours as needed. Patient not taking: Reported on 09/06/2018 05/20/18   Dorena Dew, FNP  oxyCODONE (OXY IR/ROXICODONE) 5 MG immediate release tablet Take 1 tablet (5 mg total) by mouth every 6 (six) hours as needed for moderate pain or severe pain. Patient not taking: Reported on 09/06/2018 05/20/18   Dorena Dew, FNP    Family History Family History  Problem Relation Age of Onset  . Hypertension Paternal Grandfather   . Sickle cell trait Father   . Cancer Mother        Died in 02-21-2009    Social History Social History   Tobacco Use  . Smoking status: Current Every Day Smoker    Packs/day: 0.50    Types: Cigarettes  . Smokeless tobacco: Never Used  Substance Use Topics  . Alcohol use: No    Alcohol/week: 6.0 standard drinks  Types: 6 Shots of liquor per week    Comment: Denies 06/12/2014  . Drug use: Yes    Types: Marijuana, Cocaine     Allergies   Patient has no known allergies.   Review of Systems Review of Systems  Constitutional: Negative.   Respiratory: Negative.   Cardiovascular: Negative.   Gastrointestinal: Negative.   Musculoskeletal:       Pain to bilateral upper and bilateral lower extremity.  Skin: Negative.   Neurological: Negative.   All other systems reviewed and are negative.    Physical Exam Updated Vital Signs BP 108/66   Pulse 87   Temp 98.7 F (37.1 C) (Oral)   Resp 19   LMP 08/06/2018 (Approximate)   SpO2 99%   Physical Exam   Constitutional: She appears well-developed and well-nourished. No distress.  HENT:  Head: Atraumatic.  Eyes: Pupils are equal, round, and reactive to light.  Neck: Normal range of motion.  Cardiovascular: Normal rate and intact distal pulses.  Pulmonary/Chest: No respiratory distress.  Abdominal: She exhibits no distension.  Musculoskeletal: Normal range of motion.  Tenderness to bilateral upper and lower extremity.  Full range of motion without difficulty.  5/5 strength bilateral upper extremity  Neurological: She is alert.  Intact sensation to sharp and dull bilateral upper and lower extremity.  Skin: Skin is warm and dry. She is not diaphoretic.  No edema, erythema, ecchymosis or warmth.  Psychiatric: She has a normal mood and affect.  Nursing note and vitals reviewed.    ED Treatments / Results  Labs (all labs ordered are listed, but only abnormal results are displayed) Labs Reviewed  COMPREHENSIVE METABOLIC PANEL - Abnormal; Notable for the following components:      Result Value   Total Bilirubin 4.3 (*)    All other components within normal limits  CBC WITH DIFFERENTIAL/PLATELET - Abnormal; Notable for the following components:   RBC 2.28 (*)    Hemoglobin 8.2 (*)    HCT 23.7 (*)    MCV 103.9 (*)    MCH 36.0 (*)    RDW 18.9 (*)    Platelets 563 (*)    All other components within normal limits  RETICULOCYTES - Abnormal; Notable for the following components:   Retic Ct Pct 15.8 (*)    RBC. 2.28 (*)    Retic Count, Absolute 359.6 (*)    Immature Retic Fract 30.4 (*)    All other components within normal limits  I-STAT BETA HCG BLOOD, ED ([facility], [facility], [facility] ONLY)    EKG None  Radiology No results found.  Procedures .Critical Care Performed by: Nettie Elm, PA-C Authorized by: Nettie Elm, PA-C   Critical care provider statement:    Critical care time (minutes):  45   Critical care was necessary to treat or prevent imminent or life-threatening  deterioration of the following conditions:  Circulatory failure   Critical care was time spent personally by me on the following activities:  Discussions with consultants, evaluation of patient's response to treatment, examination of patient, ordering and performing treatments and interventions, ordering and review of laboratory studies, ordering and review of radiographic studies, pulse oximetry, re-evaluation of patient's condition, obtaining history from patient or surrogate and review of old charts   (including critical care time)  Medications Ordered in ED Medications  HYDROmorphone (DILAUDID) injection 2 mg (has no administration in time range)    Or  HYDROmorphone (DILAUDID) injection 2 mg (has no administration in time range)  0.45 % sodium  chloride infusion (has no administration in time range)  HYDROmorphone (DILAUDID) injection 2 mg ( Intravenous See Alternative 09/06/18 1526)    Or  HYDROmorphone (DILAUDID) injection 2 mg (2 mg Subcutaneous Given 09/06/18 1526)  HYDROmorphone (DILAUDID) injection 2 mg (2 mg Intravenous Given 09/06/18 1600)    Or  HYDROmorphone (DILAUDID) injection 2 mg ( Subcutaneous See Alternative 09/06/18 1600)  HYDROmorphone (DILAUDID) injection 2 mg (2 mg Intravenous Given 09/06/18 1635)    Or  HYDROmorphone (DILAUDID) injection 2 mg ( Subcutaneous See Alternative 09/06/18 1635)  diphenhydrAMINE (BENADRYL) injection 25 mg (25 mg Intravenous Given 09/06/18 1600)  ketorolac (TORADOL) 30 MG/ML injection 30 mg (30 mg Intravenous Given 09/06/18 1559)  diphenhydrAMINE (BENADRYL) capsule 25 mg (25 mg Oral Given 09/06/18 1715)     Initial Impression / Assessment and Plan / ED Course  I have reviewed the triage vital signs and the nursing notes.  Pertinent labs & imaging results that were available during my care of the patient were reviewed by me and considered in my medical decision making (see chart for details).  22 year old female who appears otherwise well  presents for evaluation of sickle cell pain crisis. Afebrile, non septic, non ill appearing. Pain of typical sickle cell crisis located to bilateral upper and lower extremities.  Denies chest pain, shortness of breath, no suspicion for acute chest syndrome.  History of polysubstance abuse.  She does not have care established with sickle cell clinic.  Labs without leukocytosis, HCg negative, elevated retic count, metabolic panel negative. On reevaluation patient continued at 9/10 after three rounds of medication. Patient requesting admission at this time.  Will consult with hospitalist for admission.  Consult with [facility] hospitalist Dr. Venetia Constable who agrees for admission for pain management.   Final Clinical Impressions(s) / ED Diagnoses   Final diagnoses:  None    ED Discharge Orders    None       Dory Demont A, PA-C 09/06/18 1721    Orlie Dakin, MD 09/07/18 [phone]

## 2018-09-06 NOTE — ED Notes (Signed)
Pt becoming increasingly aggressive on the phone with someone and screaming profanity. Pt instructed that she needs to keep conversation appropriate.

## 2018-09-06 NOTE — ED Notes (Signed)
Complete sets of vitals have been attempted multiple times but the patient continues to rip off equipment. Education attempted multiple times by staff, however patient still continues to take off equipment before vitals can be completed.

## 2018-09-07 ENCOUNTER — Encounter (HOSPITAL_COMMUNITY): Payer: Self-pay

## 2018-09-07 ENCOUNTER — Other Ambulatory Visit: Payer: Self-pay

## 2018-09-07 LAB — BASIC METABOLIC PANEL
Anion gap: 5 (ref 5–15)
BUN: 13 mg/dL (ref 6–20)
CHLORIDE: 112 mmol/L — AB (ref 98–111)
CO2: 26 mmol/L (ref 22–32)
CREATININE: 0.81 mg/dL (ref 0.44–1.00)
Calcium: 9.2 mg/dL (ref 8.9–10.3)
GFR calc Af Amer: >60 mL/min (ref >60)
Glucose, Bld: 95 mg/dL (ref 70–99)
Potassium: 4.2 mmol/L (ref 3.5–5.1)
SODIUM: 143 mmol/L (ref 135–145)

## 2018-09-07 LAB — CBC
HCT: 20.2 % — ABNORMAL LOW (ref 36.0–46.0)
Hemoglobin: 7.2 g/dL — ABNORMAL LOW (ref 12.0–15.0)
MCH: 37.1 pg — AB (ref 26.0–34.0)
MCHC: 35.6 g/dL (ref 30.0–36.0)
MCV: 104.1 fL — ABNORMAL HIGH (ref 80.0–100.0)
PLATELETS: 451 10*3/uL — AB (ref 150–400)
RBC: 1.94 MIL/uL — ABNORMAL LOW (ref 3.87–5.11)
RDW: 19.6 % — ABNORMAL HIGH (ref 11.5–15.5)
WBC: 14.4 10*3/uL — ABNORMAL HIGH (ref 4.0–10.5)
nRBC: 3.5 % — ABNORMAL HIGH (ref 0.0–0.2)

## 2018-09-07 NOTE — Progress Notes (Signed)
Pharmacy IV to PO conversion  The patient is ordered Diphenhydramine by the intravenous route with a linked PO option available. Based on criteria approved by the Pharmacy and [place], the IV option is being discontinued.   Not prescribed to treat or prevent a severe allergic reaction   Not prescribed as premedication prior to receiving blood product, biologic medication, antimicrobial, or chemotherapy agent   The patient has tolerated at least one dose of an oral or enteral medication   The patient has no evidence of active gastrointestinal bleeding or impaired GI absorption (gastrectomy, short bowel, patient on TNA or NPO).   The patient is not undergoing procedural sedation  If you have any questions about this conversion, please contact the Pharmacy Department (ext [phone]).  Thank you.  Reuel Boom, PharmD, BCPS [phone] 09/07/2018, 9:08 AM

## 2018-09-07 NOTE — Progress Notes (Signed)
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  Patient has pain at 9 out of 10 today.  Mainly in her back and legs.  No fever or chills no nausea vomiting or diarrhea.  She is on Dilaudid PCA with Toradol and IV fluids.  White count has gone up and Hb has dropped slightly.  Objective: Vital signs in last 24 hours: Temp:  [98 F (36.7 C)-98.6 F (37 C)] 98.2 F (36.8 C) (11/09 1005) Pulse Rate:  [55-98] 59 (11/09 1005) Resp:  [12-18] 18 (11/09 1133) BP: (104-134)/(54-78) 104/64 (11/09 1005) SpO2:  [95 %-100 %] 96 % (11/09 1133) Weight:  [55.5 kg-56.5 kg] 56.5 kg (11/09 0412) Weight change:  Last BM Date: 09/05/18  Intake/Output from previous day: 11/08 0701 - 11/09 0700 In: 1082.6 [I.V.:1082.6] Out: -  Intake/Output this shift: No intake/output data recorded.  General appearance: alert, cooperative, appears stated age and no distress Head: Normocephalic, without obvious abnormality, atraumatic Eyes: conjunctivae/corneas clear. PERRL, EOM's intact. Fundi benign. Neck: no adenopathy, no carotid bruit, no JVD, supple, symmetrical, trachea midline and thyroid not enlarged, symmetric, no tenderness/mass/nodules Back: symmetric, no curvature. ROM normal. No CVA tenderness. Resp: clear to auscultation bilaterally Cardio: regular rate and rhythm, S1, S2 normal, no murmur, click, rub or gallop GI: soft, non-tender; bowel sounds normal; no masses,  no organomegaly Extremities: extremities normal, atraumatic, no cyanosis or edema Pulses: 2+ and symmetric Skin: Skin color, texture, turgor normal. No rashes or lesions Neurologic: Grossly normal  Lab Results: Recent Labs    09/06/18 1556 09/07/18 0431  WBC 12.0* 14.4*  HGB 8.2* 7.2*  HCT 23.7* 20.2*  PLT 563* 451*   BMET Recent Labs    09/06/18 1556 09/07/18 0431  NA 136 143  K 3.8 4.2  CL 103 112*  CO2 26 26  GLUCOSE 76 95  BUN 9 13  CREATININE 0.95 0.81  CALCIUM 8.9  9.2    Studies/Results: No results found.  Medications: I have reviewed the patient's current medications.  Assessment/Plan: [name] admitted with sickle cell painful crisis.  #1 sickle cell painful crisis: Patient is doing much better today.  Pain is down to 8 out of 10 from 10 out of 10.  She is homeless and has had issues with medications.  Continue current Dilaudid PCA with Toradol and IV fluids.  Once stable may discharge on oral pain medications.  #2 sickle cell anemia: Hemoglobin dropped from 8.2-7.2 probably due to hemodilution.  Continue monitoring.  No transfusion at this point.  #3 thrombocytosis: Secondary to vaso-occlusive crisis.  Continue monitoring.  #4 homelessness: Education officer, museum involvement.  #5 leukocytosis: Secondary to vaso-occlusive crisis.  So far stable at this point.  LOS: 1 day   ,LAWAL 09/07/2018, 3:30 PM

## 2018-09-08 LAB — CBC WITH DIFFERENTIAL/PLATELET
BAND NEUTROPHILS: 0 %
BLASTS: 0 %
Basophils Absolute: 0 10*3/uL (ref 0.0–0.1)
Basophils Relative: 0 %
EOS ABS: 0.2 10*3/uL (ref 0.0–0.5)
EOS PCT: 1 %
HEMATOCRIT: 20.5 % — AB (ref 36.0–46.0)
HEMOGLOBIN: 7.5 g/dL — AB (ref 12.0–15.0)
LYMPHS PCT: 48 %
Lymphs Abs: 8.2 10*3/uL — ABNORMAL HIGH (ref 0.7–4.0)
MCH: 37.7 pg — ABNORMAL HIGH (ref 26.0–34.0)
MCHC: 36.6 g/dL — ABNORMAL HIGH (ref 30.0–36.0)
MCV: 103 fL — ABNORMAL HIGH (ref 80.0–100.0)
MONOS PCT: 7 %
Metamyelocytes Relative: 0 %
Monocytes Absolute: 1.2 10*3/uL — ABNORMAL HIGH (ref 0.1–1.0)
Myelocytes: 1 %
NEUTROS ABS: 7.6 10*3/uL (ref 1.7–7.7)
Neutrophils Relative %: 43 %
OTHER: 0 %
PLATELETS: 529 10*3/uL — AB (ref 150–400)
Promyelocytes Relative: 0 %
RBC: 1.99 MIL/uL — AB (ref 3.87–5.11)
RDW: 22.1 % — ABNORMAL HIGH (ref 11.5–15.5)
WBC: 17.2 10*3/uL — AB (ref 4.0–10.5)
nRBC: 12 /100 WBC — ABNORMAL HIGH
nRBC: 7.6 % — ABNORMAL HIGH (ref 0.0–0.2)

## 2018-09-08 LAB — COMPREHENSIVE METABOLIC PANEL
ALK PHOS: 60 U/L (ref 38–126)
ALT: 30 U/L (ref 0–44)
AST: 29 U/L (ref 15–41)
Albumin: 3.9 g/dL (ref 3.5–5.0)
Anion gap: 7 (ref 5–15)
BUN: 16 mg/dL (ref 6–20)
CALCIUM: 8.9 mg/dL (ref 8.9–10.3)
CO2: 24 mmol/L (ref 22–32)
Chloride: 105 mmol/L (ref 98–111)
Creatinine, Ser: 1.06 mg/dL — ABNORMAL HIGH (ref 0.44–1.00)
GFR calc Af Amer: >60 mL/min (ref >60)
Glucose, Bld: 119 mg/dL — ABNORMAL HIGH (ref 70–99)
Potassium: 4.9 mmol/L (ref 3.5–5.1)
SODIUM: 136 mmol/L (ref 135–145)
Total Bilirubin: 4.2 mg/dL — ABNORMAL HIGH (ref 0.3–1.2)
Total Protein: 6.7 g/dL (ref 6.5–8.1)

## 2018-09-08 NOTE — Progress Notes (Signed)
Subjective: Patient doing better except for pain in left arm which is at 7/10. On dilaudid PCA as well as Toradol and IVF. No fever, no NVD.  Objective: Vital signs in last 24 hours: Temp:  [98.4 F (36.9 C)-98.8 F (37.1 C)] 98.7 F (37.1 C) (11/10 0449) Pulse Rate:  [61-66] 61 (11/10 0449) Resp:  [14-18] 15 (11/10 0457) BP: (103-118)/(56-64) 103/60 (11/10 0449) SpO2:  [92 %-100 %] 98 % (11/10 0457) Weight:  [57.7 kg] 57.7 kg (11/10 0357) Weight change: 2.2 kg Last BM Date: 09/05/18  Intake/Output from previous day: 11/09 0701 - 11/10 0700 In: 1900 [P.O.:1900] Out: 3900 [Urine:3900] Intake/Output this shift: No intake/output data recorded.  General appearance: alert, cooperative, appears stated age and no distress Head: Normocephalic, without obvious abnormality, atraumatic Eyes: conjunctivae/corneas clear. PERRL, EOM's intact. Fundi benign. Neck: no adenopathy, no carotid bruit, no JVD, supple, symmetrical, trachea midline and thyroid not enlarged, symmetric, no tenderness/mass/nodules Back: symmetric, no curvature. ROM normal. No CVA tenderness. Resp: clear to auscultation bilaterally Cardio: regular rate and rhythm, S1, S2 normal, no murmur, click, rub or gallop GI: soft, non-tender; bowel sounds normal; no masses,  no organomegaly Extremities: extremities normal, atraumatic, no cyanosis or edema Pulses: 2+ and symmetric Skin: Skin color, texture, turgor normal. No rashes or lesions Neurologic: Grossly normal  Lab Results: Recent Labs    09/07/18 0431 09/08/18 0825  WBC 14.4* PENDING  HGB 7.2* 7.5*  HCT 20.2* 20.5*  PLT 451* 529*   BMET Recent Labs    09/07/18 0431 09/08/18 0825  NA 143 136  K 4.2 4.9  CL 112* 105  CO2 26 24  GLUCOSE 95 119*  BUN 13 16  CREATININE 0.81 1.06*  CALCIUM 9.2 8.9    Studies/Results: No results found.  Medications: I have reviewed the patient's current medications.  Assessment/Plan: [name] admitted with  sickle cell painful crisis.  #1 sickle cell painful crisis: Patient is improving.  Continue current Dilaudid PCA with Toradol and IV fluids.  Once stable may discharge in next 24 hours on oral pain medications.  #2 sickle cell anemia: Hemoglobin is stable at 7.5.  Continue monitoring.  No transfusion at this point.  #3 thrombocytosis: Secondary to vaso-occlusive crisis.  Continue monitoring.  #4 homelessness: Education officer, museum involvement if needed. She has family that live locally.  #5 leukocytosis: Secondary to vaso-occlusive crisis.  So far stable at this point.   LOS: 2 days   Becki Mccaskill,LAWAL 09/08/2018, 10:48 AM

## 2018-09-09 DIAGNOSIS — D72823 Leukemoid reaction: Secondary | ICD-10-CM

## 2018-09-09 DIAGNOSIS — D5700 Hb-SS disease with crisis, unspecified: Principal | ICD-10-CM

## 2018-09-09 DIAGNOSIS — Z590 Homelessness: Secondary | ICD-10-CM

## 2018-09-09 LAB — RAPID URINE DRUG SCREEN, HOSP PERFORMED
Amphetamines: NOT DETECTED
Barbiturates: NOT DETECTED
Benzodiazepines: NOT DETECTED
Cocaine: POSITIVE — AB
OPIATES: POSITIVE — AB
Tetrahydrocannabinol: NOT DETECTED

## 2018-09-09 LAB — CBC
HEMATOCRIT: 18.6 % — AB (ref 36.0–46.0)
Hemoglobin: 6.7 g/dL — CL (ref 12.0–15.0)
MCH: 36.8 pg — ABNORMAL HIGH (ref 26.0–34.0)
MCHC: 36 g/dL (ref 30.0–36.0)
MCV: 102.2 fL — AB (ref 80.0–100.0)
NRBC: 5.7 % — AB (ref 0.0–0.2)
Platelets: 447 10*3/uL — ABNORMAL HIGH (ref 150–400)
RBC: 1.82 MIL/uL — AB (ref 3.87–5.11)
RDW: 22.5 % — ABNORMAL HIGH (ref 11.5–15.5)
WBC: 16.6 10*3/uL — AB (ref 4.0–10.5)

## 2018-09-09 MED ORDER — ACETAMINOPHEN 500 MG PO TABS
1000.0000 mg | ORAL_TABLET | Freq: Four times a day (QID) | ORAL | Status: DC | PRN
  Administered 2018-09-09 (×2): 1000 mg via ORAL
  Filled 2018-09-09 (×2): qty 2

## 2018-09-09 MED ORDER — MAGNESIUM CITRATE PO SOLN
1.0000 | Freq: Once | ORAL | Status: AC
  Administered 2018-09-09: 1 via ORAL
  Filled 2018-09-09: qty 296

## 2018-09-09 MED ORDER — OXYCODONE-ACETAMINOPHEN 5-325 MG PO TABS
1.0000 | ORAL_TABLET | ORAL | Status: DC | PRN
  Administered 2018-09-10 (×2): 1 via ORAL
  Filled 2018-09-09 (×2): qty 1

## 2018-09-09 MED ORDER — DEXTROSE-NACL 5-0.45 % IV SOLN
INTRAVENOUS | Status: DC
  Administered 2018-09-09: 1000 mL via INTRAVENOUS

## 2018-09-09 MED ORDER — NICOTINE 21 MG/24HR TD PT24
21.0000 mg | MEDICATED_PATCH | Freq: Every day | TRANSDERMAL | Status: DC
  Administered 2018-09-09 – 2018-09-10 (×2): 21 mg via TRANSDERMAL
  Filled 2018-09-09 (×3): qty 1

## 2018-09-09 NOTE — Progress Notes (Deleted)
WASTED 60MG /ML MORPHINE FOR A MORPHINE 100ML 1ML/MG  BAG, WITH JAYDEN RUSSELL,RN.

## 2018-09-09 NOTE — Progress Notes (Signed)
Subjective: [name], 22 year old female with a history of sickle cell anemia, chronic pain syndrome, polysubstance abuse, and homelessness was admitted and sickle cell crisis.  Patient states that pain intensity did not improve overnight.  Pain is primarily to left upper arm which is consistent with previous sickle cell crisis.  Patient states that she does not have home medication and was inquiring whether she would be discharged with a prescription for opiate medications. Current pain intensity 7/10.  Patient afebrile.  She denies chest pain, headache, dyspnea, dysuria, nausea, vomiting, or diarrhea.  Objective:  Vital signs in last 24 hours:  Vitals:   09/09/18 0403 09/09/18 0507 09/09/18 0952 09/09/18 1000  BP: 108/64   104/62  Pulse: 74   73  Resp: 14 17 11 16   Temp: 98.2 F (36.8 C)   98.3 F (36.8 C)  TempSrc: Oral   Oral  SpO2: 94% 92% 94% 94%  Weight:      Height:        Intake/Output from previous day:   Intake/Output Summary (Last 24 hours) at 09/09/2018 1055 Last data filed at 09/08/2018 2001 Gross per 24 hour  Intake 957 ml  Output 900 ml  Net 57 ml    Physical Exam: General: Alert, awake, oriented x3, in no acute distress.  HEENT: [place]/AT PEERL, EOMI Neck: Trachea midline,  no masses, no thyromegal,y no JVD, no carotid bruit OROPHARYNX:  Moist, No exudate/ erythema/lesions.  Heart: Regular rate and rhythm, without murmurs, rubs, gallops, PMI non-displaced, no heaves or thrills on palpation.  Lungs: Clear to auscultation, no wheezing or rhonchi noted. No increased vocal fremitus resonant to percussion  Abdomen: Soft, nontender, nondistended, positive bowel sounds, no masses no hepatosplenomegaly noted..  Neuro: No focal neurological deficits noted cranial nerves II through XII grossly intact. DTRs 2+ bilaterally upper and lower extremities. Strength 5 out of 5 in bilateral upper and lower extremities. Musculoskeletal: No warm swelling or erythema  around joints, no spinal tenderness noted. Psychiatric: Patient alert and oriented x3, good insight and cognition, good recent to remote recall. Lymph node survey: No cervical axillary or inguinal lymphadenopathy noted.  Lab Results:  Basic Metabolic Panel:    Component Value Date/Time   NA 136 09/08/2018 0825   K 4.9 09/08/2018 0825   CL 105 09/08/2018 0825   CO2 24 09/08/2018 0825   BUN 16 09/08/2018 0825   CREATININE 1.06 (H) 09/08/2018 0825   GLUCOSE 119 (H) 09/08/2018 0825   CALCIUM 8.9 09/08/2018 0825   CBC:    Component Value Date/Time   WBC 16.6 (H) 09/09/2018 0925   HGB 6.7 (LL) 09/09/2018 0925   HCT 18.6 (L) 09/09/2018 0925   PLT 447 (H) 09/09/2018 0925   MCV 102.2 (H) 09/09/2018 0925   NEUTROABS 7.6 09/08/2018 0825   LYMPHSABS 8.2 (H) 09/08/2018 0825   MONOABS 1.2 (H) 09/08/2018 0825   EOSABS 0.2 09/08/2018 0825   BASOSABS 0.0 09/08/2018 0825    No results found for this or any previous visit (from the past 240 hour(s)).  Studies/Results: No results found.  Medications: Scheduled Meds: . heparin  5,000 Units Subcutaneous Q8H  . HYDROmorphone   Intravenous Q4H  . Influenza vac split quadrivalent PF  0.5 mL Intramuscular Tomorrow-1000  . magnesium citrate  1 Bottle Oral Once  . polyethylene glycol  17 g Oral Daily   Continuous Infusions: PRN Meds:.[DISCONTINUED] diphenhydrAMINE **OR** diphenhydrAMINE, hydrOXYzine, ketorolac, naloxone **AND** sodium chloride flush, ondansetron **OR** ondansetron (ZOFRAN) IV, oxyCODONE-acetaminophen  Assessment/Plan: Active Problems:  Sickle cell pain crisis (HCC)   Leukocytosis   Protein-calorie malnutrition, severe   Homelessness   Substance abuse ([place])   Sickle cell anemia (HCC)   Sickle cell crisis (HCC)  Sickle cell anemia with pain crisis: Continue hypotonic IV fluids Continue custom dose Dilaudid PCA at 1.25 mg/h. Percocet 5-3 25 every 4 hours as needed for moderate to severe breakthrough pain. Monitor  vital signs very closely Reevaluate pain scale consistently in context of functioning IV Toradol 15 mg every 6 hours  Anemia of chronic disease: Hemoglobin is currently 6.7, which is below baseline.  Patient is not symptomatic.  Maintaining oxygen saturation above 90% on room air and afebrile.  Patient denies chest pain or dyspnea.  No medical indication for blood transfusion at this time.  If hemoglobin drops below 6.0, will transfuse 1 unit of packed red blood cells.  Continue to monitor hemoglobin closely.  Possible hemodilution, will decrease IV fluids.  Leukocytosis: WBC is elevated at 16,600.  Suspected to be reactive.  Patient afebrile.  No signs of infection.  We will continue to monitor closely.  History of polysubstance abuse: Urine drug screen pending, will review results as they become available.  Previous drug screen positive for cocaine.  Patient counseled at length.  Admits to using crack cocaine 2 days prior to admission.  Social work consult pending.  Homelessness: Social work consult pending. Will contact [employer], [place] sickle cell agency for assistance with drug rehabilitation placement.  Discussed plan with patient at length.  Code Status: Full Code Family Communication: N/A Disposition Plan: Not yet ready for discharge  [place], MSN, FNP-C Patient [facility] [address] [place], [place] [address] [phone]  If 5PM-7AM, please contact night-coverage.  09/09/2018, 10:55 AM  LOS: 3 days

## 2018-09-09 NOTE — Progress Notes (Signed)
CSW met with patient for homelessness resources consult. Patient is familiar with local shelters and the [facility]. Patient has had Medicaid in the past, but her coverage lapsed within the past year. Patient agreeable for CSW to consult financial counseling for assistance with Medicaid application.  CSW left voicemail for financial counseling.  CSW signing off. Please reconsult if social work needs arise.   Financial Counseling  [place], [place] Social Worker [phone]

## 2018-09-09 NOTE — Clinical Social Work Note (Signed)
Clinical Social Work Assessment  Patient Details  Name: [name] MRN: [id number] Date of Birth: 01/03/1996  Date of referral:  09/09/18               Reason for consult:  [employer], Housing Concerns/Homelessness                Permission sought to share information with:  Tourist information centre manager, Other Permission granted to share information::  Yes, Verbal Permission Granted  Name::        Agency::     Relationship::  Case management, Financial Counseling  Contact Information:     Housing/Transportation Living arrangements for the past 2 months:  No permanent address, Homeless Source of Information:  Patient Patient Interpreter Needed:  None Criminal Activity/Legal Involvement Pertinent to Current Situation/Hospitalization:  No - Comment as needed Significant Relationships:  None Lives with:  Self Do you feel safe going back to the place where you live?  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 Consulted for homelessness issues.  Social Worker assessment / plan:   CSW met with patient at bedside to introduce self, role, and purpose of consult.  Patient reports being homeless and "staying here and there" for "awhile," greater than one year. Patient provided limited information but endorsed floating between friend's houses. Patient reports she has no social supports, no one she can turn to for help or encouragement.  The patient is considered with health insurance and acquiring medications. Per patient, she has received SSI and Medicaid in the past, but lost both approximately one year ago. Patient states she will need a copy of her medical records to reinstate both SSI and Medicaid.   Since losing Medicaid and SSI, patient reports she has been unable to participate in consistent medical treatment for her sickle cell disease.   Patient  agreeable to [place] consulting case management for possible assistance with medication and financial counseling to assist the patient in re-applying for medicaid.   Employment status:  Unemployed, Disabled (Comment on whether or not currently receiving Disability) Insurance information:  Medicare, Self Pay (Medicaid Pending) PT Recommendations:  Not assessed at this time Information / Referral to community resources:  Shelter  Patient/Family's Response to care:  Patient was thankful of CSW intervention.  Patient/Family's Understanding of and Emotional Response to Diagnosis, Current Treatment, and Prognosis:  Patient hopeful to have Medicaid and SSI reinstated and resume regular wellness appointments.   Emotional Assessment Appearance:  Appears stated age Attitude/Demeanor/Rapport:    Affect (typically observed):  Accepting, Calm, Pleasant Orientation:  Oriented to Self, Oriented to Place, Oriented to  Time, Oriented to Situation Alcohol / Substance use:  Not Applicable Psych involvement (Current and /or in the community):  No (Comment)  Discharge Needs  Concerns to be addressed:  Medication Concerns, Homelessness, Financial / Insurance Concerns Readmission within the last 30 days:  No Current discharge risk:  Homeless, Chronically ill Barriers to Discharge:  Homeless with medical needs, Inadequate or no insurance   Joellen [place], [place] 09/09/2018, 2:34 PM

## 2018-09-10 DIAGNOSIS — E43 Unspecified severe protein-calorie malnutrition: Secondary | ICD-10-CM

## 2018-09-10 LAB — CBC
HEMATOCRIT: 20 % — AB (ref 36.0–46.0)
HEMOGLOBIN: 7.1 g/dL — AB (ref 12.0–15.0)
MCH: 36 pg — AB (ref 26.0–34.0)
MCHC: 35.5 g/dL (ref 30.0–36.0)
MCV: 101.5 fL — ABNORMAL HIGH (ref 80.0–100.0)
Platelets: 528 10*3/uL — ABNORMAL HIGH (ref 150–400)
RBC: 1.97 MIL/uL — AB (ref 3.87–5.11)
RDW: 22.5 % — ABNORMAL HIGH (ref 11.5–15.5)
WBC: 15.9 10*3/uL — ABNORMAL HIGH (ref 4.0–10.5)
nRBC: 3.9 % — ABNORMAL HIGH (ref 0.0–0.2)

## 2018-09-10 MED ORDER — GABAPENTIN 300 MG PO CAPS
300.0000 mg | ORAL_CAPSULE | Freq: Three times a day (TID) | ORAL | Status: DC
  Administered 2018-09-10 – 2018-09-11 (×4): 300 mg via ORAL
  Filled 2018-09-10 (×4): qty 1

## 2018-09-10 MED ORDER — LIP MEDEX EX OINT
TOPICAL_OINTMENT | CUTANEOUS | Status: AC
  Administered 2018-09-10: 19:00:00
  Filled 2018-09-10: qty 7

## 2018-09-10 NOTE — Progress Notes (Cosign Needed)
Subjective: [name], 22 year old female with a history of sickle cell anemia, chronic pain syndrome, polysubstance abuse, and homelessness was admitted and sickle cell crisis.  Patient states that pain intensity did not improve overnight.    Patient says that pain to left upper arm has worsened. Current pain intensity is 8/10.   Patient afebrile.  She denies chest pain, headache, dyspnea, dysuria, nausea, vomiting, or diarrhea.  Objective:  Vital signs in last 24 hours:  Vitals:   09/10/18 0319 09/10/18 0638 09/10/18 0735 09/10/18 0945  BP:  (!) 116/58  121/63  Pulse:  68  97  Resp: 15 16 14 16   Temp:  98.4 F (36.9 C)  98.4 F (36.9 C)  TempSrc:  Oral  Oral  SpO2: 98% 98%  94%  Weight:      Height:        Intake/Output from previous day:   Intake/Output Summary (Last 24 hours) at 09/10/2018 1134 Last data filed at 09/10/2018 0300 Gross per 24 hour  Intake 843.37 ml  Output -  Net 843.37 ml    Physical Exam: General: Alert, awake, oriented x3, in no acute distress.  HEENT: [place]/AT [place]ER[place], EOMI Neck: Trachea midline,  no masses, no thyromegal,y no JVD, no carotid bruit OROPHARYNX:  Moist, No exudate/ erythema/lesions.  Heart: Regular rate and rhythm, without murmurs, rubs, gallops, PMI non-displaced, no heaves or thrills on palpation.  Lungs: Clear to auscultation, no wheezing or rhonchi noted. No increased vocal fremitus resonant to percussion  Abdomen: Soft, nontender, nondistended, positive bowel sounds, no masses no hepatosplenomegaly noted..  Neuro: No focal neurological deficits noted cranial nerves II through XII grossly intact. DTRs 2+ bilaterally upper and lower extremities. Strength 5 out of 5 in bilateral upper and lower extremities. Musculoskeletal: No warm swelling or erythema around joints, no spinal tenderness noted. Psychiatric: Patient alert and oriented x3, good insight and cognition, good recent to remote recall. Lymph node survey: No cervical  axillary or inguinal lymphadenopathy noted.  Lab Results:  Basic Metabolic Panel:    Component Value Date/Time   NA 136 09/08/2018 0825   K 4.9 09/08/2018 0825   CL 105 09/08/2018 0825   CO2 24 09/08/2018 0825   BUN 16 09/08/2018 0825   CREATININE 1.06 (H) 09/08/2018 0825   GLUCOSE 119 (H) 09/08/2018 0825   CALCIUM 8.9 09/08/2018 0825   CBC:    Component Value Date/Time   WBC 15.9 (H) 09/10/2018 0536   HGB 7.1 (L) 09/10/2018 0536   HCT 20.0 (L) 09/10/2018 0536   PLT 528 (H) 09/10/2018 0536   MCV 101.5 (H) 09/10/2018 0536   NEUTROABS 7.6 09/08/2018 0825   LYMPHSABS 8.2 (H) 09/08/2018 0825   MONOABS 1.2 (H) 09/08/2018 0825   EOSABS 0.2 09/08/2018 0825   BASOSABS 0.0 09/08/2018 0825    No results found for this or any previous visit (from the past 240 hour(s)).  Studies/Results: No results found.  Medications: Scheduled Meds: . gabapentin  300 mg Oral TID  . heparin  5,000 Units Subcutaneous Q8H  . HYDROmorphone   Intravenous Q4H  . nicotine  21 mg Transdermal Daily  . polyethylene glycol  17 g Oral Daily   Continuous Infusions: PRN Meds:.acetaminophen, [DISCONTINUED] diphenhydrAMINE **OR** diphenhydrAMINE, hydrOXYzine, ketorolac, naloxone **AND** sodium chloride flush, ondansetron **OR** ondansetron (ZOFRAN) IV, oxyCODONE-acetaminophen  Assessment/Plan: Active Problems:   Sickle cell pain crisis (HCC)   Leukocytosis   Protein-calorie malnutrition, severe   Homelessness   Substance abuse (HCC)   Sickle cell anemia ([place])  Sickle cell crisis (HCC)  Sickle cell anemia with pain crisis: Continue hypotonic IV fluids Continue custom dose Dilaudid PCA at 1.25 mg/h. Percocet 5-3 25 every 4 hours as needed for moderate to severe breakthrough pain. Monitor vital signs very closely Reevaluate pain scale consistently in context of functioning IV Toradol 15 mg every 6 hours  Anemia of chronic disease: Hemoglobin is currently 6.7, which is below baseline.  Patient is  not symptomatic.  Maintaining oxygen saturation above 90% on room air and afebrile.  Patient denies chest pain or dyspnea.  No medical indication for blood transfusion at this time.  If hemoglobin drops below 6.0, will transfuse 1 unit of packed red blood cells.  Continue to monitor hemoglobin closely.  Possible hemodilution, will decrease IV fluids.  Leukocytosis: WBC stable. Suspected to be reactive.  Patient afebrile.  No signs of infection.  We will continue to monitor closely.  History of polysubstance abuse: Urine drug screen results positive for cocaine. Patient was accepted to [facility] for drug rehabilitation. She now states that she is not going, she will be discharging home with her grandmother.   Patient counseled at length.  Admits to using crack cocaine 2 days prior to admission.    [facility] [place] sickle cell agency for assisted with finding drug rehabilitation placement.  Discussed plan with patient at length.  Code Status: Full Code Family Communication: N/A Disposition Plan: Not yet ready for discharge  [place], MSN, FNP-C Patient [facility] [address] [place], [place] [address] [phone]  If 5PM-7AM, please contact night-coverage.  09/10/2018, 11:34 AM  LOS: 4 days

## 2018-09-11 DIAGNOSIS — F1910 Other psychoactive substance abuse, uncomplicated: Secondary | ICD-10-CM

## 2018-09-11 MED ORDER — GABAPENTIN 300 MG PO CAPS: 300.0000 mg | ORAL_CAPSULE | Freq: Three times a day (TID) | ORAL | 0 refills | Status: DC

## 2018-09-11 MED ORDER — IBUPROFEN 600 MG PO TABS: 600.0000 mg | ORAL_TABLET | Freq: Three times a day (TID) | ORAL | 0 refills | Status: DC | PRN

## 2018-09-11 NOTE — Discharge Instructions (Signed)
Chemical Dependency Chemical dependency is an addiction to drugs or alcohol. People with this addiction repeatedly seek out and use drugs or alcohol despite negative consequences to the health and safety of themselves and others. Addiction changes the way the brain works. Because of these changes, addiction is a chronic condition. The medical term for addiction or chemical dependency is substance use disorder. The disorder can be mild, moderate, or severe. People can be dependent on a range of substances. These include alcohol, prescription medicines, and illegal or street drugs, such as marijuana, heroin, and cocaine. What are the causes? This condition is caused by the effect that the abused substance has on the brain. What increases the risk? The following factors may make you more likely to develop this condition:  Havinga family history of chemical dependency.  Having mental health issues, such as depression, anxiety, or bipolar disorder.  Living in an environment where drugs and alcohol are easily available.  Using drugs or alcohol at a young age.  Having friends who use drugs or alcohol.  Having poor social skills.  Tending to be aggressive or impulsive.  What are the signs or symptoms? Symptoms may vary depending on the substance that you are addicted to. Symptoms may include the following: Physical Symptoms  The inability to limit the use of drugs or alcohol.  Having nausea, sweating, shakiness, and anxiety when you are not using alcohol or drugs.  Needing a greater amount of drugs or alcohol to get the same effect (developing tolerance).  A change in: ? Sleeping habits. ? Eating or appetite. ? Appearance or how you care for yourself. Emotional Symptoms  Angry outbursts.  Periods of sadness and tearfulness.  Isolation. Relationship Problems  Loved ones suggesting that you have a problem.  Increased fights.  Forgetting commitments.  Having affairs or  one-night stands. Problems Related to Irresponsibility  Legal problems.  Irresponsibility with money.  Missing work.  Poor decision making. How is this diagnosed? This condition may be diagnosed based on your symptoms, your medical history, and a physical exam. You may also have blood tests and urine tests. How is this treated? Treatment for this condition depends on the substance that you are addicted to and whether your dependency is mild, moderate, or severe. Treatment options may include:  Stopping substance use safely. This may require taking medicines and being closely observed for several days.  Taking part in group and individual counseling with mental health providers who help people with chemical dependency.  Staying at a residential treatment center for several days or weeks.  Attending daily counseling sessions at a treatment center.  Taking medicine as told by your health care provider to: ? Ease symptoms and prevent complications during withdrawal. ? Treat other mental health issues, such as depression or anxiety. ? Block cravings by causing the same effects as the substance. ? Block the effects of the substance or replace good sensations with unpleasant ones.  Going to a support group to share your experience with others who are going through the same thing. These groups are an important part of long-term recovery for many people. They include 12-step programs like Alcoholics Anonymous and Narcotics Anonymous.  Recovery can be a long process. Many people who undergo treatment start using the substance again after stopping. This is called a relapse. If you have a relapse, it does not mean that treatment will not work. Follow these instructions at home:  Avoid temptations or triggers that you associate with your use of the  substance.  Learn and practice techniques for managing stress.  Have a plan for vulnerable moments.  Get phone numbers of those who are willing  to help and who are committed to your recovery.  Know when and where the meetings that you have chosen will occur.  Take over-the-counter and prescription medicines only as told by your health care provider.  Keep all follow-up visits as told by your health care provider. This is important. Contact a health care provider if:  You cannot take your medicines as told.  Your symptoms get worse.  You have trouble resisting the urge to use drugs or alcohol.  You are in pain, shaking, sweating, or feeling generally unwell.  You are losing weight without trying to. Get help right away if:  You lose consciousness.  Your breathing is slow.  Your pulse is slow or jumpy.  You have serious thoughts about hurting yourself or someone else.  You have a relapse. This information is not intended to replace advice given to you by your health care provider. Make sure you discuss any questions you have with your health care provider. Document Released: 10/10/2001 Document Revised: 11/22/2015 Document Reviewed: 06/23/2015 Elsevier Interactive Patient Education  2018 [place]. Sickle Cell Anemia, Adult Sickle cell anemia is a condition where your red blood cells are shaped like sickles. Red blood cells carry oxygen through the body. Sickle-shaped red blood cells do not live as long as normal red blood cells. They also clump together and block blood from flowing through the blood vessels. These things prevent the body from getting enough oxygen. Sickle cell anemia causes organ damage and pain. It also increases the risk of infection. Follow these instructions at home:  Drink enough fluid to keep your pee (urine) clear or pale yellow. Drink more in hot weather and during exercise.  Do not smoke. Smoking lowers oxygen levels in the blood.  Only take over-the-counter or prescription medicines as told by your doctor.  Take antibiotic medicines as told by your doctor. Make sure you finish them even  if you start to feel better.  Take supplements as told by your doctor.  Consider wearing a medical alert bracelet. This tells anyone caring for you in an emergency of your condition.  When traveling, keep your medical information, doctors' names, and the medicines you take with you at all times.  If you have a fever, do not take fever medicines right away. This could cover up a problem. Tell your doctor.  Keep all follow-up visits with your doctor. Sickle cell anemia requires regular medical care. Contact a doctor if: You have a fever. Get help right away if:  You feel dizzy or faint.  You have new belly (abdominal) pain, especially on the left side near the stomach area.  You have a lasting, often uncomfortable and painful erection of the penis (priapism). If it is not treated right away, you will become unable to have sex (impotence).  You have numbness in your arms or legs or you have a hard time moving them.  You have a hard time talking.  You have a fever or lasting symptoms for more than 2-3 days.  You have a fever and your symptoms suddenly get worse.  You have signs or symptoms of infection. These include: ? Chills. ? Being more tired than normal (lethargy). ? Irritability. ? Poor eating. ? Throwing up (vomiting).  You have pain that is not helped with medicine.  You have shortness of breath.  You  have pain in your chest.  You are coughing up pus-like or bloody mucus.  You have a stiff neck.  Your feet or hands swell or have pain.  Your belly looks bloated.  Your joints hurt. This information is not intended to replace advice given to you by your health care provider. Make sure you discuss any questions you have with your health care provider. Document Released: 08/06/2013 Document Revised: 03/23/2016 Document Reviewed: 05/28/2013 Elsevier Interactive Patient Education  2017 [employer].

## 2018-09-12 ENCOUNTER — Telehealth: Payer: Self-pay | Admitting: Family Medicine

## 2018-09-12 ENCOUNTER — Encounter (HOSPITAL_COMMUNITY): Payer: Self-pay | Admitting: General Practice

## 2018-09-12 ENCOUNTER — Emergency Department (HOSPITAL_COMMUNITY)
Admission: EM | Admit: 2018-09-12 | Discharge: 2018-09-12 | Disposition: A | Discharge status: Other Acute Inpatient Hospital | Payer: Medicare Other | Attending: Emergency Medicine | Admitting: Emergency Medicine

## 2018-09-12 ENCOUNTER — Non-Acute Institutional Stay (HOSPITAL_BASED_OUTPATIENT_CLINIC_OR_DEPARTMENT_OTHER)
Admission: AD | Admit: 2018-09-12 | Discharge: 2018-09-12 | Disposition: A | Discharge status: Home / Self Care | Payer: Medicare Other | Source: Ambulatory Visit | Attending: Internal Medicine | Admitting: Internal Medicine

## 2018-09-12 DIAGNOSIS — D5700 Hb-SS disease with crisis, unspecified: Secondary | ICD-10-CM | POA: Diagnosis present

## 2018-09-12 DIAGNOSIS — F17210 Nicotine dependence, cigarettes, uncomplicated: Secondary | ICD-10-CM | POA: Insufficient documentation

## 2018-09-12 DIAGNOSIS — Z79899 Other long term (current) drug therapy: Secondary | ICD-10-CM | POA: Insufficient documentation

## 2018-09-12 LAB — CBC WITH DIFFERENTIAL/PLATELET
Abs Immature Granulocytes: 0.13 10*3/uL — ABNORMAL HIGH (ref 0.00–0.07)
Basophils Absolute: 0.1 10*3/uL (ref 0.0–0.1)
Basophils Relative: 0 %
EOS PCT: 0 %
Eosinophils Absolute: 0.1 10*3/uL (ref 0.0–0.5)
HCT: 19.8 % — ABNORMAL LOW (ref 36.0–46.0)
HEMOGLOBIN: 7 g/dL — AB (ref 12.0–15.0)
Immature Granulocytes: 1 %
LYMPHS ABS: 6.3 10*3/uL — AB (ref 0.7–4.0)
LYMPHS PCT: 37 %
MCH: 36.1 pg — AB (ref 26.0–34.0)
MCHC: 35.4 g/dL (ref 30.0–36.0)
MCV: 102.1 fL — AB (ref 80.0–100.0)
Monocytes Absolute: 1.5 10*3/uL — ABNORMAL HIGH (ref 0.1–1.0)
Monocytes Relative: 9 %
Neutro Abs: 9.1 10*3/uL — ABNORMAL HIGH (ref 1.7–7.7)
Neutrophils Relative %: 53 %
PLATELETS: 555 10*3/uL — AB (ref 150–400)
RBC: 1.94 MIL/uL — ABNORMAL LOW (ref 3.87–5.11)
RDW: 19.4 % — ABNORMAL HIGH (ref 11.5–15.5)
WBC: 17.1 10*3/uL — ABNORMAL HIGH (ref 4.0–10.5)
nRBC: 2.7 % — ABNORMAL HIGH (ref 0.0–0.2)

## 2018-09-12 LAB — URINALYSIS, ROUTINE W REFLEX MICROSCOPIC
BILIRUBIN URINE: NEGATIVE
Bacteria, UA: NONE SEEN
Glucose, UA: NEGATIVE mg/dL
Ketones, ur: NEGATIVE mg/dL
NITRITE: NEGATIVE
Protein, ur: NEGATIVE mg/dL
SPECIFIC GRAVITY, URINE: 1.01 (ref 1.005–1.030)
pH: 6 (ref 5.0–8.0)

## 2018-09-12 LAB — COMPREHENSIVE METABOLIC PANEL
ALT: 73 U/L — AB (ref 0–44)
ANION GAP: 8 (ref 5–15)
AST: 64 U/L — ABNORMAL HIGH (ref 15–41)
Albumin: 4.4 g/dL (ref 3.5–5.0)
Alkaline Phosphatase: 83 U/L (ref 38–126)
BUN: 20 mg/dL (ref 6–20)
CALCIUM: 9.5 mg/dL (ref 8.9–10.3)
CHLORIDE: 104 mmol/L (ref 98–111)
CO2: 26 mmol/L (ref 22–32)
CREATININE: 0.79 mg/dL (ref 0.44–1.00)
Glucose, Bld: 99 mg/dL (ref 70–99)
Potassium: 5.5 mmol/L — ABNORMAL HIGH (ref 3.5–5.1)
SODIUM: 138 mmol/L (ref 135–145)
Total Bilirubin: 4.4 mg/dL — ABNORMAL HIGH (ref 0.3–1.2)
Total Protein: 8.3 g/dL — ABNORMAL HIGH (ref 6.5–8.1)

## 2018-09-12 LAB — RAPID URINE DRUG SCREEN, HOSP PERFORMED
AMPHETAMINES: NOT DETECTED
BARBITURATES: NOT DETECTED
BENZODIAZEPINES: NOT DETECTED
COCAINE: POSITIVE — AB
Opiates: NOT DETECTED
Tetrahydrocannabinol: NOT DETECTED

## 2018-09-12 LAB — RETICULOCYTES
IMMATURE RETIC FRACT: 31.5 % — AB (ref 2.3–15.9)
RBC.: 1.94 MIL/uL — ABNORMAL LOW (ref 3.87–5.11)
RETIC COUNT ABSOLUTE: 332.3 10*3/uL — AB (ref 19.0–186.0)
RETIC CT PCT: 17.1 % — AB (ref 0.4–3.1)

## 2018-09-12 MED ORDER — SODIUM CHLORIDE 0.45 % IV SOLN
INTRAVENOUS | Status: DC
  Administered 2018-09-12: 05:00:00 via INTRAVENOUS

## 2018-09-12 MED ORDER — HYDROMORPHONE HCL 2 MG/ML IJ SOLN
2.0000 mg | Freq: Once | INTRAMUSCULAR | Status: AC
  Administered 2018-09-12: 2 mg via SUBCUTANEOUS
  Filled 2018-09-12: qty 1

## 2018-09-12 MED ORDER — HYDROMORPHONE HCL 1 MG/ML IJ SOLN
1.0000 mg | INTRAMUSCULAR | Status: AC
  Administered 2018-09-12: 1 mg via INTRAVENOUS
  Filled 2018-09-12: qty 1

## 2018-09-12 MED ORDER — DEXTROSE-NACL 5-0.45 % IV SOLN
INTRAVENOUS | Status: DC
  Administered 2018-09-12: 11:00:00 via INTRAVENOUS

## 2018-09-12 MED ORDER — OXYCODONE HCL 5 MG PO CAPS: 5.0000 mg | ORAL_CAPSULE | Freq: Four times a day (QID) | ORAL | 0 refills | Status: DC | PRN

## 2018-09-12 MED ORDER — HYDROMORPHONE HCL 1 MG/ML IJ SOLN
0.5000 mg | INTRAMUSCULAR | Status: AC
  Administered 2018-09-12: 0.5 mg via INTRAVENOUS
  Filled 2018-09-12: qty 1

## 2018-09-12 MED ORDER — HYDROMORPHONE HCL 1 MG/ML IJ SOLN: 1.0000 mg | INTRAMUSCULAR | Status: AC

## 2018-09-12 MED ORDER — OXYCODONE HCL 5 MG PO CAPS: 5.0000 mg | ORAL_CAPSULE | Freq: Four times a day (QID) | ORAL | 0 refills | Status: AC | PRN

## 2018-09-12 MED ORDER — KETOROLAC TROMETHAMINE 15 MG/ML IJ SOLN
15.0000 mg | INTRAMUSCULAR | Status: AC
  Administered 2018-09-12: 15 mg via INTRAVENOUS
  Filled 2018-09-12: qty 1

## 2018-09-12 MED ORDER — OXYCODONE HCL 5 MG PO TABS
20.0000 mg | ORAL_TABLET | Freq: Once | ORAL | Status: AC
  Administered 2018-09-12: 20 mg via ORAL
  Filled 2018-09-12: qty 4

## 2018-09-12 MED ORDER — KETOROLAC TROMETHAMINE 30 MG/ML IJ SOLN
30.0000 mg | Freq: Once | INTRAMUSCULAR | Status: AC
  Administered 2018-09-12: 30 mg via INTRAVENOUS
  Filled 2018-09-12: qty 1

## 2018-09-12 MED ORDER — OXYCODONE HCL 5 MG PO TABS
20.0000 mg | ORAL_TABLET | Freq: Once | ORAL | Status: DC
  Administered 2018-09-12: 20 mg via ORAL
  Filled 2018-09-12: qty 4

## 2018-09-12 MED ORDER — HYDROMORPHONE HCL 1 MG/ML IJ SOLN: 0.5000 mg | INTRAMUSCULAR | Status: AC

## 2018-09-12 MED FILL — oxyCODONE HCL 5 MG TABS: 5 | 3 days supply | Qty: 12 | Fill #0

## 2018-09-12 NOTE — Discharge Instructions (Signed)
Sickle Cell Anemia, Adult °Sickle cell anemia is a condition where your red blood cells are shaped like sickles. Red blood cells carry oxygen through the body. Sickle-shaped red blood cells do not live as long as normal red blood cells. They also clump together and block blood from flowing through the blood vessels. These things prevent the body from getting enough oxygen. Sickle cell anemia causes organ damage and pain. It also increases the risk of infection. °Follow these instructions at home: °· Drink enough fluid to keep your pee (urine) clear or pale yellow. Drink more in hot weather and during exercise. °· Do not smoke. Smoking lowers oxygen levels in the blood. °· Only take over-the-counter or prescription medicines as told by your doctor. °· Take antibiotic medicines as told by your doctor. Make sure you finish them even if you start to feel better. °· Take supplements as told by your doctor. °· Consider wearing a medical alert bracelet. This tells anyone caring for you in an emergency of your condition. °· When traveling, keep your medical information, doctors' names, and the medicines you take with you at all times. °· If you have a fever, do not take fever medicines right away. This could cover up a problem. Tell your doctor. °· Keep all follow-up visits with your doctor. Sickle cell anemia requires regular medical care. °Contact a doctor if: °You have a fever. °Get help right away if: °· You feel dizzy or faint. °· You have new belly (abdominal) pain, especially on the left side near the stomach area. °· You have a lasting, often uncomfortable and painful erection of the penis (priapism). If it is not treated right away, you will become unable to have sex (impotence). °· You have numbness in your arms or legs or you have a hard time moving them. °· You have a hard time talking. °· You have a fever or lasting symptoms for more than 2-3 days. °· You have a fever and your symptoms suddenly get  worse. °· You have signs or symptoms of infection. These include: °? Chills. °? Being more tired than normal (lethargy). °? Irritability. °? Poor eating. °? Throwing up (vomiting). °· You have pain that is not helped with medicine. °· You have shortness of breath. °· You have pain in your chest. °· You are coughing up pus-like or bloody mucus. °· You have a stiff neck. °· Your feet or hands swell or have pain. °· Your belly looks bloated. °· Your joints hurt. °This information is not intended to replace advice given to you by your health care provider. Make sure you discuss any questions you have with your health care provider. °Document Released: 08/06/2013 Document Revised: 03/23/2016 Document Reviewed: 05/28/2013 °Elsevier Interactive Patient Education © 2017 [employer] ° °

## 2018-09-12 NOTE — Discharge Summary (Signed)
[facility] Discharge Summary   Patient ID: REDITH DRACH MRN: [id number] DOB/AGE: 05/04/1996 22 y.o.  Admit date: 09/12/2018 Discharge date: 09/12/2018  Primary Care Physician:  Patient, No Pcp Per  Admission Diagnoses:  Active Problems:   Sickle cell crisis [facility])   Discharge Medications:  Allergies as of 09/12/2018   No Known Allergies     Medication List    TAKE these medications   folic acid 1 MG tablet Commonly known as:  FOLVITE Take 1 tablet (1 mg total) by mouth daily.   gabapentin 300 MG capsule Commonly known as:  NEURONTIN Take 1 capsule (300 mg total) by mouth 3 (three) times daily.   ibuprofen 600 MG tablet Commonly known as:  ADVIL,MOTRIN Take 1 tablet (600 mg total) by mouth every 8 (eight) hours as needed.   oxycodone 5 MG capsule Commonly known as:  OXY-IR Take 1 capsule (5 mg total) by mouth every 6 (six) hours as needed for up to 3 days.        Consults:  None  Significant Diagnostic Studies:  No results found.   [facility] Course: [name] was admitted to day infusion center for pain management and extended observation.  Patient was recently discharged from inpatient services on 09/11/2018.  Patient returned to emergency department several hours later.  Pain persisted despite treatment with IV Dilaudid, so patient was transitioned to day hospital. While in day hospital hypotonic IV fluids continued at 50 mL/h  Toradol 30 mg IV x1 Dilaudid 2 mg subcutaneously x2 Oxycodone 20 mg x 1. Pain intensity decreased to 6/10.  Patient remained hemodynamically stable throughout admission.  Will discharge home with grandmother in stable condition.   With regard to patient's positive urine drug screen, patient was referred to [facility] sickle cell agency case management.  Patient was excepted into drug rehabilitation at a [facility] in [facility].  She refused admission.  Patient states  that last cocaine use was 3 days prior to admission as a substitute for pain medication being that she does not have a medical provider.  Patient has been offered assistance with finding a provider but has been a no-show for appointments.  On occasion patient's remained in the hospital until discharge and an appointment was made, but she refuses to follow-up.  Case management is assisting with barriers by offering transportation to appointments.  Patient given prescription for oxycodone 5 mg every 6 hours #12.  Also prior to discharge on 09/11/2018 prescriptions for ibuprofen 800 every 8 hours for mild to moderate pain and gabapentin 300 mg 3 times daily.  Patient advised to continue to hydrate with 64 ounces of water per day.  Patient was provided a bus voucher.  However,  she says that her grandmother will be coming to pick her up.   The patient was given clear instructions to go to ER or return to medical center if symptoms do not improve, worsen or new problems develop. The patient verbalized understanding.    Physical Exam at Discharge:  BP 113/61 (BP Location: Left Arm)   Pulse 70   Temp 97.8 F (36.6 C) (Oral)   Resp 16   LMP 09/06/2018   SpO2 96%  Physical Exam  Constitutional: She appears well-developed and well-nourished.  HENT:  Head: Normocephalic and atraumatic.  Eyes: Pupils are equal, round, and reactive to light.  Cardiovascular: Normal rate, regular rhythm and normal heart sounds.  Pulmonary/Chest: Effort normal and breath sounds normal.  Abdominal:  Soft. Bowel sounds are normal.  Skin: Skin is warm and dry.  Psychiatric: She has a normal mood and affect. Her behavior is normal. Judgment and thought content normal.    Disposition at Discharge: Discharge disposition: 01-Home or Self Care       Discharge Orders: Discharge Instructions    Discharge patient   Complete by:  As directed    Discharge disposition:  01-Home or Self Care   Discharge patient date:   09/12/2018      Condition at Discharge:   Stable  Time spent on Discharge:  Greater than 30 minutes.  Signed:  Donia Pounds  APRN, MSN, FNP-C [facility] [address] [place], [place] [address] [phone]  09/12/2018, 4:16 PM

## 2018-09-12 NOTE — H&P (Signed)
[facility] History and Physical   Date: 09/12/2018  Patient name: [name] Medical record number: [id number] Date of birth: 06-29-1996 Age: 22 y.o. Gender: female PCP: Patient, No Pcp Per  Attending physician: Tresa Garter, MD  Chief Complaint: Generalized pain  History of Present Illness: Evania Lyne, a 22 year old female with a history of sickle cell anemia, anemia of chronic disease, and cocaine use presents complaining of generalized pain that is consistent with typical sickle cell crisis. Patient was discharged from inpatient services on 08/11/2018. She is currently homeless and was discharged home with an uncle that agreed to take her in. Patient presented to the emergency department overnight. She says that pain worsened following a long bus ride. She says that she did not pick up medications because she knew that they would not help with pain. She was given prescription for Ibuprofen and Gabapentin. Patient admits to occasionally using crack cocaine. She last used 1 week ago. She transitioned from the emergency department this am. Patient was treated with IV dilaudid and IV Toradol. Pain persists despite treatment. Pain intensity 8/10 on arrival to clinic.   Patient denies headache, chest pain, heart palpitations, dysuria, nausea, vomiting, or diarrhea.   Meds: Medications Prior to Admission  Medication Sig Dispense Refill Last Dose  . folic acid (FOLVITE) 1 MG tablet Take 1 tablet (1 mg total) by mouth daily. (Patient not taking: Reported on 09/06/2018) 30 tablet 1 Completed Course at Unknown time  . gabapentin (NEURONTIN) 300 MG capsule Take 1 capsule (300 mg total) by mouth 3 (three) times daily. 90 capsule 0 not started  . ibuprofen (ADVIL,MOTRIN) 600 MG tablet Take 1 tablet (600 mg total) by mouth every 8 (eight) hours as needed. 30 tablet 0     Allergies: Patient has no known allergies. Past Medical History:  Diagnosis Date  .  Acute kidney injury ([place]) 05/15/2016  . Amenorrhea   . Anemia    SICKLE CELL  . Asthma   . Drug-induced pruritus 02/26/2015  . GERD (gastroesophageal reflux disease)   . Headache   . Hyperbilirubinemia 02/26/2015  . Sickle cell disease ([place])    Past Surgical History:  Procedure Laterality Date  . 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 TONSILLECTOMY     Age 81   Family History  Problem Relation Age of Onset  . Hypertension Paternal Grandfather   . Sickle cell trait Father   . Cancer Mother        Died in February 09, 2009   Social History   Socioeconomic History  . Marital status: Single    Spouse name: Not on file  . Number of children: Not on file  . Years of education: Not on file  . Highest education level: Not on file  Occupational History  . Occupation: unemployed  Social Needs  . Financial resource strain: Not on file  . Food insecurity:    Worry: Not on file    Inability: Not on file  . Transportation needs:    Medical: Not on file    Non-medical: Not on file  Tobacco Use  . Smoking status: Current Every Day Smoker    Packs/day: 0.50    Years: 10.00    Pack years: 5.00    Types: Cigarettes  . Smokeless tobacco: Never Used  Substance and Sexual Activity  . Alcohol use: No    Alcohol/week: 6.0 standard drinks    Types: 6 Shots of liquor per week  Comment: Denies 06/12/2014  . Drug use: Yes    Types: Marijuana, Cocaine  . Sexual activity: Yes    Birth control/protection: Condom    Comment: Stable relationship, trying X2 years for pregnancy; [name] is boyfriend (44)  Lifestyle  . Physical activity:    Days per week: Not on file    Minutes per session: Not on file  . Stress: Not on file  Relationships  . Social connections:    Talks on phone: Not on file    Gets together: Not on file    Attends religious service: Not on file    Active member of club or organization: Not on file    Attends meetings of clubs or organizations: Not on file    Relationship  status: Not on file  . Intimate partner violence:    Fear of current or ex partner: Not on file    Emotionally abused: Not on file    Physically abused: Not on file    Forced sexual activity: Not on file  Other Topics Concern  . Not on file  Social History Narrative   Pt is doing much better in school [employer] 11th grade)   Really wants to go to college now after talking to her cousin.  Pt has aspiration of being a lawyer/physician/nurse/owning business after school.   Pt is now living with her father again.           Mom died at age 11 and patient has a lot of fears abotu dying young and really wants to be a mom but has other goals. She was 13 at time of moms death and does not have much contact with her 23 year old biological sister which saddens her.                   Review of Systems  Constitutional: Negative.   HENT: Negative.   Eyes: Negative.   Respiratory: Negative.   Cardiovascular: Negative.   Gastrointestinal: Negative.   Genitourinary: Negative.   Musculoskeletal: Positive for back pain and joint pain.  Skin: Negative.   Neurological: Negative.   Endo/Heme/Allergies: Negative.   Psychiatric/Behavioral: Negative.  Negative for depression and suicidal ideas.   Physical Exam: There were no vitals taken for this visit. Physical Exam  Constitutional: She appears well-developed and well-nourished.  HENT:  Head: Normocephalic and atraumatic.  Eyes: Pupils are equal, round, and reactive to light.  Neck: Normal range of motion.  Cardiovascular: Normal rate, regular rhythm and normal heart sounds.  Pulmonary/Chest: Effort normal and breath sounds normal.  Abdominal: Soft. Bowel sounds are normal.  Musculoskeletal: Normal range of motion.  Neurological: She is alert.  Skin: Skin is warm and dry.  Psychiatric: She has a normal mood and affect. Her behavior is normal. Thought content normal.    Lab results: Results for orders placed or performed during the hospital  encounter of 09/12/18 (from the past 24 hour(s))  Comprehensive metabolic panel     Status: Abnormal   Collection Time: 09/12/18  4:15 AM  Result Value Ref Range   Sodium 138 135 - 145 mmol/L   Potassium 5.5 (H) 3.5 - 5.1 mmol/L   Chloride 104 98 - 111 mmol/L   CO2 26 22 - 32 mmol/L   Glucose, Bld 99 70 - 99 mg/dL   BUN 20 6 - 20 mg/dL   Creatinine, Ser 0.79 0.44 - 1.00 mg/dL   Calcium 9.5 8.9 - 10.3 mg/dL   Total Protein 8.3 (H) 6.5 -  8.1 g/dL   Albumin 4.4 3.5 - 5.0 g/dL   AST 64 (H) 15 - 41 U/L   ALT 73 (H) 0 - 44 U/L   Alkaline Phosphatase 83 38 - 126 U/L   Total Bilirubin 4.4 (H) 0.3 - 1.2 mg/dL   GFR calc non Af Amer >60 >60 mL/min   GFR calc Af Amer >60 >60 mL/min   Anion gap 8 5 - 15  Rapid urine drug screen (hospital performed)     Status: Abnormal   Collection Time: 09/12/18  4:41 AM  Result Value Ref Range   Opiates NONE DETECTED NONE DETECTED   Cocaine POSITIVE (A) NONE DETECTED   Benzodiazepines NONE DETECTED NONE DETECTED   Amphetamines NONE DETECTED NONE DETECTED   Tetrahydrocannabinol NONE DETECTED NONE DETECTED   Barbiturates NONE DETECTED NONE DETECTED  Urinalysis, Routine w reflex microscopic     Status: Abnormal   Collection Time: 09/12/18  4:41 AM  Result Value Ref Range   Color, Urine YELLOW YELLOW   APPearance CLEAR CLEAR   Specific Gravity, Urine 1.010 1.005 - 1.030   pH 6.0 5.0 - 8.0   Glucose, UA NEGATIVE NEGATIVE mg/dL   Hgb urine dipstick SMALL (A) NEGATIVE   Bilirubin Urine NEGATIVE NEGATIVE   Ketones, ur NEGATIVE NEGATIVE mg/dL   Protein, ur NEGATIVE NEGATIVE mg/dL   Nitrite NEGATIVE NEGATIVE   Leukocytes, UA SMALL (A) NEGATIVE   RBC / HPF 0-5 0 - 5 RBC/hpf   WBC, UA 0-5 0 - 5 WBC/hpf   Bacteria, UA NONE SEEN NONE SEEN   Squamous Epithelial / LPF 0-5 0 - 5  CBC with Differential/Platelet     Status: Abnormal   Collection Time: 09/12/18  5:20 AM  Result Value Ref Range   WBC 17.1 (H) 4.0 - 10.5 K/uL   RBC 1.94 (L) 3.87 - 5.11 MIL/uL    Hemoglobin 7.0 (L) 12.0 - 15.0 g/dL   HCT 19.8 (L) 36.0 - 46.0 %   MCV 102.1 (H) 80.0 - 100.0 fL   MCH 36.1 (H) 26.0 - 34.0 pg   MCHC 35.4 30.0 - 36.0 g/dL   RDW 19.4 (H) 11.5 - 15.5 %   Platelets 555 (H) 150 - 400 K/uL   nRBC 2.7 (H) 0.0 - 0.2 %   Neutrophils Relative % 53 %   Neutro Abs 9.1 (H) 1.7 - 7.7 K/uL   Lymphocytes Relative 37 %   Lymphs Abs 6.3 (H) 0.7 - 4.0 K/uL   Monocytes Relative 9 %   Monocytes Absolute 1.5 (H) 0.1 - 1.0 K/uL   Eosinophils Relative 0 %   Eosinophils Absolute 0.1 0.0 - 0.5 K/uL   Basophils Relative 0 %   Basophils Absolute 0.1 0.0 - 0.1 K/uL   Immature Granulocytes 1 %   Abs Immature Granulocytes 0.13 (H) 0.00 - 0.07 K/uL   Tammy Sours Bodies PRESENT    Polychromasia PRESENT    Sickle Cells MARKED    Target Cells PRESENT   Reticulocytes     Status: Abnormal   Collection Time: 09/12/18  5:20 AM  Result Value Ref Range   Retic Ct Pct 17.1 (H) 0.4 - 3.1 %   RBC. 1.94 (L) 3.87 - 5.11 MIL/uL   Retic Count, Absolute 332.3 (H) 19.0 - 186.0 K/uL   Immature Retic Fract 31.5 (H) 2.3 - 15.9 %    Imaging results:  No results found.   Assessment & Plan:  Patient will be admitted to the day infusion center for extended  observation  Start IV D5.45 for cellular rehydration at 50/hr  Start Toradol 15 mg times one  Dilaudid 2 mg SQ times one  Oxycodone 20 mg times one   Patient will be re-evaluated for pain intensity in the context of function and relationship to baseline as care progresses.  If no significant pain relief, will transfer patient to inpatient services for a higher level of care.   Reviewed labs, WBCs elevated, patient afebrile. No infectious process suspected.   Contacted [facility] for case management assistance with homelessness.       Donia Pounds  APRN, MSN, FNP-C [facility] [address] [place],  [address] [phone]  09/12/2018, 10:37  AM

## 2018-09-12 NOTE — ED Notes (Signed)
Triage in downtime folder

## 2018-09-12 NOTE — Progress Notes (Signed)
Patient admitted to the day hospital for treatment of sickle cell pain crisis from the ER. Patient reported pain rated 8/10 in the back and both legs. Patient hydrated with IV fluids, sub Q Diluadid, IV Toradol and PO Oxycodone. At discharge patient reported her pain at 6/10. Discharge instructions given to patient. Patient agreed to go and pick prescription medication already sent to the pharmacy. Patient given a bus pass she said she will use to go home. Patient alert, oriented and ambulatory at discharge. Patient told to go to the ER if she feels sick.

## 2018-09-12 NOTE — ED Notes (Signed)
Pt more alert at this time. Soda, cheese stick, and sandwich given.

## 2018-09-12 NOTE — Discharge Summary (Addendum)
Physician Discharge Summary  [name] IWP:[id number] DOB: 1996/04/04 DOA: 09/06/2018  PCP: Patient, No Pcp Per  Admit date: 09/06/2018  Discharge date: 09/12/2018  Discharge Diagnoses:  Active Problems:   Sickle cell pain crisis (HCC)   Leukocytosis   Protein-calorie malnutrition, severe   Homelessness   Substance abuse ([place])   Sickle cell anemia (HCC)   Sickle cell crisis [facility])   Discharge Condition: Stable  Disposition:  Follow-up [place] Follow up.   Specialty:  Internal Medicine Why:  Make follow up appointment to establish primary care MD. Contact information: [place] [phone]       [facility] Follow up.   Why:  Use this website for medication coupons         Pt is discharged home in good condition and is to follow up with Patient, No Pcp Per this week to have labs evaluated. [name] is instructed to increase activity slowly and balance with rest for the next few days, and use prescribed medication to complete treatment of pain  Diet: Regular Wt Readings from Last 3 Encounters:  09/09/18 59.4 kg  05/18/18 65.4 kg  03/05/18 56.7 kg    History of present illness:  [name], a 22 year old female with a medical history significant for sickle cell anemia, hemoglobin [place] and polysubstance abuse.  Patient admits to using crack cocaine 2 days prior to admission.  Also, patient states that she is currently homeless.  She was previously discharged from the hospital on 05/20/2018 for sickle cell crisis.  Patient comes in complaining of left shoulder pain and bilateral lower extremity pain that began yesterday.  Current pain intensity 10/10 and patient is not on any home medications for pain.  Patient denies fever, chills, nausea, vomiting, chest pain, shortness of breath, abdominal pain, or dysuria.  Patient states that pain is not alleviated or  aggravated by anything.  ER course: Patient afebrile, WBCs 12 point.  Vital signs unremarkable.  Pain persists despite treatment, patient admitted and sickle cell crisis.  Hospital Course:  Sickle cell anemia with pain crisis Patient was admitted for sickle cell pain crisis and managed appropriately with IVF, IV Dilaudid via PCA and IV Toradol, as well as other adjunct therapies per sickle cell pain management protocols.  Patient opiate nave, not on home medications prior to admission.  Received Dilaudid PCA at 1.25 mg/h.  Patient was not maximizing PCA.  Pain intensity at discharge 4/10.  Anemia of chronic disease: Hemoglobin decreased to 6.7, decreased IV fluids.  Suspected to be related to hemodilution.  Return to baseline at 7.1.  Patient asymptomatic.  Maintaining oxygen saturation above 90% on room air and afebrile.  Patient denies chest pain or dyspnea.  There was no medical indication for blood transfusion during admission.  Patient advised to establish care at the patient care center.  Discussed at length.  Recommend repeating CBC and CMP in 1 week.  History of polysubstance abuse: Urine drug screen positive during admission.  Patient counseled at length.  Admits to crack cocaine use 2 days prior to admission.  Consulted with Enid Cutter at [facility] sickle cell agency, who was able to get patient into a [facility], a drug rehabilitation facility in [place].  Patient refused admission.  She states that she was not willing to go to [facility].  Patient has been offered assistance with finding a provider but has been a no-show for appointments.  On  occasion patient's remained in the hospital until discharge and an appointment was made, but she refuses to follow-up.  Case management is assisting with barriers by offering transportation to appointments.  Homelessness: Patient states that she has spoken to family.  Her grandmother is willing to allow her to stay for short period of time.   Transportation has been set up with [place] [facility] agency.  Patient was discharged home today in a hemodynamically stable condition.   Discharge Exam: Vitals:   09/11/18 1013 09/11/18 1329  BP: (!) 111/54 112/68  Pulse: 87 60  Resp: 16 16  Temp: 99.7 F (37.6 C) 99.2 F (37.3 C)  SpO2: 95% 94%   Vitals:   09/11/18 0356 09/11/18 0806 09/11/18 1013 09/11/18 1329  BP:   (!) 111/54 112/68  Pulse:   87 60  Resp: 14 14 16 16   Temp:   99.7 F (37.6 C) 99.2 F (37.3 C)  TempSrc:   Oral Oral  SpO2: 98% 98% 95% 94%  Weight:      Height:       Physical Exam  Constitutional: She is oriented to person, place, and time. She appears well-developed and well-nourished.  Eyes: Pupils are equal, round, and reactive to light.  Neck: Normal range of motion.  Cardiovascular: Normal rate, regular rhythm and normal heart sounds.  Pulmonary/Chest: Effort normal and breath sounds normal.  Abdominal: Soft. Bowel sounds are normal.  Musculoskeletal: Normal range of motion.  Neurological: She is alert and oriented to person, place, and time.  Skin: Skin is warm and dry.  Psychiatric: She has a normal mood and affect. Her speech is normal and behavior is normal. Judgment and thought content normal. Cognition and memory are normal. She is inattentive.    Discharge Instructions  Discharge Instructions    Discharge patient   Complete by:  As directed    Discharge disposition:  01-Home or Self Care   Discharge patient date:  09/11/2018     Allergies as of 09/11/2018   No Known Allergies     Medication List    TAKE these medications   folic acid 1 MG tablet Commonly known as:  FOLVITE Take 1 tablet (1 mg total) by mouth daily.   gabapentin 300 MG capsule Commonly known as:  NEURONTIN Take 1 capsule (300 mg total) by mouth 3 (three) times daily.   ibuprofen 600 MG tablet Commonly known as:  ADVIL,MOTRIN Take 1 tablet (600 mg total) by mouth every 8 (eight) hours as needed.        The results of significant diagnostics from this hospitalization (including imaging, microbiology, ancillary and laboratory) are listed below for reference.    Significant Diagnostic Studies: No results found.  Microbiology: No results found for this or any previous visit (from the past 240 hour(s)).   Labs: Basic Metabolic Panel: Recent Labs  Lab 09/06/18 1556 09/07/18 0431 09/08/18 0825 09/12/18 0415  NA 136 143 136 138  K 3.8 4.2 4.9 5.5*  CL 103 112* 105 104  CO2 26 26 24 26   GLUCOSE 76 95 119* 99  BUN 9 13 16 20   CREATININE 0.95 0.81 1.06* 0.79  CALCIUM 8.9 9.2 8.9 9.5   Liver Function Tests: Recent Labs  Lab 09/06/18 1556 09/08/18 0825 09/12/18 0415  AST 28 29 64*  ALT 40 30 73*  ALKPHOS 60 60 83  BILITOT 4.3* 4.2* 4.4*  PROT 7.5 6.7 8.3*  ALBUMIN 4.2 3.9 4.4   No results for input(s): LIPASE, AMYLASE  in the last 168 hours. No results for input(s): AMMONIA in the last 168 hours. CBC: Recent Labs  Lab 09/06/18 1556 09/07/18 0431 09/08/18 0825 09/09/18 0925 09/10/18 0536 09/12/18 0520  WBC 12.0* 14.4* 17.2* 16.6* 15.9* 17.1*  NEUTROABS 5.7  --  7.6  --   --  9.1*  HGB 8.2* 7.2* 7.5* 6.7* 7.1* 7.0*  HCT 23.7* 20.2* 20.5* 18.6* 20.0* 19.8*  MCV 103.9* 104.1* 103.0* 102.2* 101.5* 102.1*  PLT 563* 451* 529* 447* 528* 555*   Cardiac Enzymes: No results for input(s): CKTOTAL, CKMB, CKMBINDEX, TROPONINI in the last 168 hours. BNP: Invalid input(s): POCBNP CBG: No results for input(s): GLUCAP in the last 168 hours.  Time coordinating discharge: 50 minutes  Signed: Donia Pounds  APRN, MSN, FNP-C [facility] [address] [place], [place] [address] [phone]  [facility] 09/12/2018, 8:19 AM

## 2018-09-12 NOTE — ED Notes (Signed)
Pt unable to stay awake at this time, will wake up and fall back asleep. PA made aware and at bedside.

## 2018-09-12 NOTE — Telephone Encounter (Signed)
Opened in error.   Cynthia Sarinana Moore Mackie Holness  APRN, MSN, FNP-C [facility] [address]  [place], [place] [address] [phone]  

## 2018-09-12 NOTE — ED Notes (Signed)
Attempted to start IV, pt pulled back arm upon insertion, 2nd attempt infiltrated. RN Mortimer Fries at bedside to attempt IV.

## 2018-09-12 NOTE — ED Provider Notes (Addendum)
[facility] DEPT Provider Note   CSN: [id number] Arrival date & time: 09/12/18  0149     History   Chief Complaint No chief complaint on file.   HPI [name] is a 22 y.o. female with a history of Hb-SS disease, Sickle cell anemia, asthma, and splenectomy who presents to the emergency department with a chief complaint of sickle cell pain crisis.  The patient reports she has not been taking any medications for her pain prior to arrival, but has used crack cocaine to help with pain with no improvement.  She reports that she is having pain in her bilateral thighs, calves, arms, and low back, which she states is consistent with her usual sickle cell pain crises.  No modifying factors.  She denies chest pain, dyspnea, fever, chills, dysuria, vaginal bleeding, diarrhea, vomiting, or abdominal pain.  The history is provided by the patient. No language interpreter was used.    Past Medical History:  Diagnosis Date  . Acute kidney injury ([place]) 05/15/2016  . Amenorrhea   . Anemia    SICKLE CELL  . Asthma   . Drug-induced pruritus 02/26/2015  . GERD (gastroesophageal reflux disease)   . Headache   . Hyperbilirubinemia 02/26/2015  . Sickle cell disease [facility])     Patient Active Problem List   Diagnosis Date Noted  . Elevated liver enzymes 05/17/2018  . Sickle cell anemia ([place]) 05/16/2018  . Sickle cell crisis ([place]) 05/16/2018  . Sickle cell anemia with crisis ([place]) 12/16/2017  . Substance abuse ([place]) 12/16/2017  . Homelessness 02/12/2017  . Protein-calorie malnutrition, severe 10/21/2016  . Anemia due to other cause   . Hb-SS disease without crisis ([place])   . 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 Tobacco abuse   . Leukocytosis   . Sickle cell pain crisis ([place]) 02/15/2013  . Abdominal pain 07/25/2012  . Asthma, mild intermittent 12/14/2010    Past Surgical History:    Procedure Laterality Date  . 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 TONSILLECTOMY     Age 64     OB History   None      Home Medications    Prior to Admission medications   Medication Sig Start Date End Date Taking? Authorizing Provider  folic acid (FOLVITE) 1 MG tablet Take 1 tablet (1 mg total) by mouth daily. Patient not taking: Reported on 09/06/2018 05/20/18 05/20/19  Dorena Dew, FNP  gabapentin (NEURONTIN) 300 MG capsule Take 1 capsule (300 mg total) by mouth 3 (three) times daily. 09/11/18   Dorena Dew, FNP  ibuprofen (ADVIL,MOTRIN) 600 MG tablet Take 1 tablet (600 mg total) by mouth every 8 (eight) hours as needed. 09/11/18   Dorena Dew, FNP    Family History Family History  Problem Relation Age of Onset  . Hypertension Paternal Grandfather   . Sickle cell trait Father   . Cancer Mother        Died in Jan 31, 2009    Social History Social History   Tobacco Use  . Smoking status: Current Every Day Smoker    Packs/day: 0.50    Years: 10.00    Pack years: 5.00    Types: Cigarettes  . Smokeless tobacco: Never Used  Substance Use Topics  . Alcohol use: No    Alcohol/week: 6.0 standard drinks    Types: 6 Shots of liquor per week  Comment: Denies 06/12/2014  . Drug use: Yes    Types: Marijuana, Cocaine     Allergies   Patient has no known allergies.   Review of Systems Review of Systems  Constitutional: Negative for activity change, chills and fever.  Respiratory: Negative for cough and shortness of breath.   Cardiovascular: Negative for chest pain and palpitations.  Gastrointestinal: Negative for abdominal pain, diarrhea, nausea and vomiting.  Genitourinary: Negative for dysuria and urgency.  Musculoskeletal: Positive for arthralgias, back pain and myalgias. Negative for gait problem, neck pain and neck stiffness.  Skin: Negative for rash.  Allergic/Immunologic: Negative for immunocompromised state.  Neurological: Negative for  syncope, weakness, numbness and headaches.  Psychiatric/Behavioral: Negative for confusion.     Physical Exam Updated Vital Signs BP 117/62   Pulse 78   Temp 98 F (36.7 C) (Oral)   Resp 17   SpO2 96%   Physical Exam  Constitutional: No distress.  HENT:  Head: Normocephalic.  Eyes: Conjunctivae are normal.  Neck: Neck supple.  Cardiovascular: Normal rate, regular rhythm, normal heart sounds and intact distal pulses. Exam reveals no gallop and no friction rub.  No murmur heard. Pulmonary/Chest: Effort normal. No stridor. No respiratory distress. She has no wheezes. She has no rales. She exhibits no tenderness.  Abdominal: Soft. She exhibits no distension.  Musculoskeletal: She exhibits tenderness. She exhibits no edema or deformity.  Diffusely tender to palpation in the bilateral low back, arms, and legs.  She is neurovascularly intact.  No overlying erythema, edema, or warmth.  Neurological: She is alert.  Drowsy, but but easily arousable to voice.  Skin: Skin is warm. No rash noted.  Psychiatric: Her behavior is normal.  Nursing note and vitals reviewed.  ED Treatments / Results  Labs (all labs ordered are listed, but only abnormal results are displayed) Labs Reviewed  RAPID URINE DRUG SCREEN, HOSP PERFORMED - Abnormal; Notable for the following components:      Result Value   Cocaine POSITIVE (*)    All other components within normal limits  COMPREHENSIVE METABOLIC PANEL - Abnormal; Notable for the following components:   Potassium 5.5 (*)    Total Protein 8.3 (*)    AST 64 (*)    ALT 73 (*)    Total Bilirubin 4.4 (*)    All other components within normal limits  URINALYSIS, ROUTINE W REFLEX MICROSCOPIC - Abnormal; Notable for the following components:   Hgb urine dipstick SMALL (*)    Leukocytes, UA SMALL (*)    All other components within normal limits  CBC WITH DIFFERENTIAL/PLATELET - Abnormal; Notable for the following components:   WBC 17.1 (*)    RBC 1.94  (*)    Hemoglobin 7.0 (*)    HCT 19.8 (*)    MCV 102.1 (*)    MCH 36.1 (*)    RDW 19.4 (*)    Platelets 555 (*)    nRBC 2.7 (*)    Neutro Abs 9.1 (*)    Lymphs Abs 6.3 (*)    Monocytes Absolute 1.5 (*)    Abs Immature Granulocytes 0.13 (*)    All other components within normal limits  RETICULOCYTES - Abnormal; Notable for the following components:   Retic Ct Pct 17.1 (*)    RBC. 1.94 (*)    Retic Count, Absolute 332.3 (*)    Immature Retic Fract 31.5 (*)    All other components within normal limits  CBC WITH DIFFERENTIAL/PLATELET    EKG None  Radiology No  results found.  Procedures Procedures (including critical care time)  Medications Ordered in ED Medications  0.45 % sodium chloride infusion ( Intravenous New Bag/Given 09/12/18 0444)  HYDROmorphone (DILAUDID) injection 1 mg (has no administration in time range)    Or  HYDROmorphone (DILAUDID) injection 1 mg (has no administration in time range)  ketorolac (TORADOL) 15 MG/ML injection 15 mg (15 mg Intravenous Given 09/12/18 0442)  HYDROmorphone (DILAUDID) injection 0.5 mg (0.5 mg Intravenous Given 09/12/18 0441)    Or  HYDROmorphone (DILAUDID) injection 0.5 mg ( Subcutaneous See Alternative 09/12/18 0441)  HYDROmorphone (DILAUDID) injection 1 mg (1 mg Intravenous Given 09/12/18 0521)    Or  HYDROmorphone (DILAUDID) injection 1 mg ( Subcutaneous See Alternative 09/12/18 0521)  HYDROmorphone (DILAUDID) injection 1 mg (1 mg Intravenous Given 09/12/18 0603)    Or  HYDROmorphone (DILAUDID) injection 1 mg ( Subcutaneous See Alternative 09/12/18 0603)     Initial Impression / Assessment and Plan / ED Course  I have reviewed the triage vital signs and the nursing notes.  Pertinent labs & imaging results that were available during my care of the patient were reviewed by me and considered in my medical decision making (see chart for details).     22 year old female with a history of Hb-SS disease, Sickle cell anemia,  asthma, and splenectomy resenting with pain in her bilateral arms, legs, and low back that she states is consistent with her typical sickle cell pain crises.  On initial examination, the patient was drowsy, but easily arousable to voice.  The patient denied any IV drug use, pain medications prior to arrival, or alcohol use.  Discussed with the patient that I was concerned about giving her pain medication with her apparent somnolence, and she immediately became much more awake and alert as well as a fully participating in the history and physical exam.  Labs with leukocytosis of 17.1, increased from risk recent admission for where she was discharged yesterday.  UDS is positive for cocaine.  Potassium is 5.5, treated with fluids.  There are marked sickle cells, Tammy Sours bodies, polychromasia, and target cells on the smear.  Half-normal saline, Dilaudid, and Toradol have been ordered.  Will plan for reevaluation and suspect that if the patient's pain has not improved by fourth dose of pain medicine that she will be appropriate for the sickle cell day clinic.  Low suspicion for acute chest or underlying infectious etiology at this time.  Patient recheck.  She is sitting comfortably and eating a sandwich.  She reports her pain has worsened despite having received 2 doses of Dilaudid.    Patient care transferred to PA Geiple at the end of my shift. Patient presentation, ED course, and plan of care discussed with review of all pertinent labs and imaging. Please see his/her note for further details regarding further ED course and disposition.  Final Clinical Impressions(s) / ED Diagnoses   Final diagnoses:  None    ED Discharge Orders    None          Joanne Gavel, PA-C 09/12/18 0735    Molpus, Jenny Reichmann, MD 09/12/18 2247

## 2018-09-12 NOTE — ED Provider Notes (Signed)
Handoff from [place] PA-C at 7 AM.  Patient presents with arm, low back, and leg pain.  No chest pain or fever.  Labs are reassuring.  She continues to have pain after treatment here.  Currently out of pain medications and she is homeless.  Previously on oxycodone as needed.  Spoke with NP.  Patient will be transferred to the sickle cell clinic for further treatment.  BP 134/68   Pulse 65   Temp 98 F (36.7 C) (Oral)   Resp 17   SpO2 97%     Carlisle Cater, PA-C 09/12/18 [id number]    Tegeler, Gwenyth Allegra, MD 09/12/18 [phone]

## 2018-10-17 ENCOUNTER — Encounter (HOSPITAL_COMMUNITY): Payer: Self-pay | Admitting: Emergency Medicine

## 2018-10-17 ENCOUNTER — Encounter (HOSPITAL_COMMUNITY): Payer: Self-pay

## 2018-10-17 ENCOUNTER — Emergency Department (HOSPITAL_COMMUNITY)
Admission: EM | Admit: 2018-10-17 | Discharge: 2018-10-17 | Disposition: A | Discharge status: Home / Self Care | Payer: Medicare Other | Attending: Emergency Medicine | Admitting: Emergency Medicine

## 2018-10-17 ENCOUNTER — Other Ambulatory Visit: Payer: Self-pay

## 2018-10-17 ENCOUNTER — Emergency Department (HOSPITAL_COMMUNITY): Payer: Medicare Other

## 2018-10-17 ENCOUNTER — Emergency Department (HOSPITAL_COMMUNITY)
Admission: EM | Admit: 2018-10-17 | Discharge: 2018-10-18 | Disposition: A | Discharge status: Home / Self Care | Payer: Medicare Other | Source: Home / Self Care | Attending: Emergency Medicine | Admitting: Emergency Medicine

## 2018-10-17 DIAGNOSIS — F17210 Nicotine dependence, cigarettes, uncomplicated: Secondary | ICD-10-CM | POA: Diagnosis not present

## 2018-10-17 DIAGNOSIS — R0789 Other chest pain: Secondary | ICD-10-CM | POA: Insufficient documentation

## 2018-10-17 DIAGNOSIS — D571 Sickle-cell disease without crisis: Secondary | ICD-10-CM | POA: Diagnosis not present

## 2018-10-17 DIAGNOSIS — J45909 Unspecified asthma, uncomplicated: Secondary | ICD-10-CM | POA: Diagnosis not present

## 2018-10-17 DIAGNOSIS — M545 Low back pain: Secondary | ICD-10-CM | POA: Insufficient documentation

## 2018-10-17 DIAGNOSIS — D5700 Hb-SS disease with crisis, unspecified: Secondary | ICD-10-CM

## 2018-10-17 DIAGNOSIS — R079 Chest pain, unspecified: Secondary | ICD-10-CM | POA: Diagnosis present

## 2018-10-17 LAB — ETHANOL: Alcohol, Ethyl (B): <10 mg/dL (ref <10)

## 2018-10-17 LAB — COMPREHENSIVE METABOLIC PANEL
ALK PHOS: 70 U/L (ref 38–126)
ALT: 18 U/L (ref 0–44)
ALT: 16 U/L (ref 0–44)
AST: 26 U/L (ref 15–41)
AST: 26 U/L (ref 15–41)
Albumin: 4.3 g/dL (ref 3.5–5.0)
Albumin: 4.4 g/dL (ref 3.5–5.0)
Alkaline Phosphatase: 68 U/L (ref 38–126)
Anion gap: 12 (ref 5–15)
Anion gap: 11 (ref 5–15)
BUN: 12 mg/dL (ref 6–20)
BUN: 9 mg/dL (ref 6–20)
CALCIUM: 9.7 mg/dL (ref 8.9–10.3)
CO2: 21 mmol/L — ABNORMAL LOW (ref 22–32)
CO2: 21 mmol/L — ABNORMAL LOW (ref 22–32)
Calcium: 8.9 mg/dL (ref 8.9–10.3)
Chloride: 108 mmol/L (ref 98–111)
Chloride: 107 mmol/L (ref 98–111)
Creatinine, Ser: 0.87 mg/dL (ref 0.44–1.00)
Creatinine, Ser: 0.8 mg/dL (ref 0.44–1.00)
GFR calc Af Amer: >60 mL/min (ref >60)
GFR calc non Af Amer: >60 mL/min (ref >60)
GFR calc non Af Amer: >60 mL/min (ref >60)
Glucose, Bld: 105 mg/dL — ABNORMAL HIGH (ref 70–99)
Glucose, Bld: 129 mg/dL — ABNORMAL HIGH (ref 70–99)
Potassium: 4.3 mmol/L (ref 3.5–5.1)
Potassium: 4.8 mmol/L (ref 3.5–5.1)
Sodium: 141 mmol/L (ref 135–145)
Sodium: 139 mmol/L (ref 135–145)
TOTAL PROTEIN: 7.4 g/dL (ref 6.5–8.1)
Total Bilirubin: 4.3 mg/dL — ABNORMAL HIGH (ref 0.3–1.2)
Total Bilirubin: 4.4 mg/dL — ABNORMAL HIGH (ref 0.3–1.2)
Total Protein: 7.4 g/dL (ref 6.5–8.1)

## 2018-10-17 LAB — I-STAT TROPONIN, ED
Troponin i, poc: 0 ng/mL (ref 0.00–0.08)
Troponin i, poc: 0.01 ng/mL (ref 0.00–0.08)

## 2018-10-17 LAB — CBC
HCT: 27.4 % — ABNORMAL LOW (ref 36.0–46.0)
Hemoglobin: 9.5 g/dL — ABNORMAL LOW (ref 12.0–15.0)
MCH: 36.3 pg — ABNORMAL HIGH (ref 26.0–34.0)
MCHC: 34.7 g/dL (ref 30.0–36.0)
MCV: 104.6 fL — ABNORMAL HIGH (ref 80.0–100.0)
Platelets: 558 10*3/uL — ABNORMAL HIGH (ref 150–400)
RBC: 2.62 MIL/uL — ABNORMAL LOW (ref 3.87–5.11)
RDW: 18.8 % — ABNORMAL HIGH (ref 11.5–15.5)
WBC: 19.4 10*3/uL — ABNORMAL HIGH (ref 4.0–10.5)
nRBC: 3.5 % — ABNORMAL HIGH (ref 0.0–0.2)

## 2018-10-17 LAB — I-STAT BETA HCG BLOOD, ED (MC, WL, AP ONLY): I-stat hCG, quantitative: <5 m[IU]/mL (ref <5)

## 2018-10-17 LAB — RAPID URINE DRUG SCREEN, HOSP PERFORMED
Amphetamines: NOT DETECTED
Barbiturates: NOT DETECTED
Benzodiazepines: NOT DETECTED
Cocaine: POSITIVE — AB
Opiates: NOT DETECTED
Tetrahydrocannabinol: NOT DETECTED

## 2018-10-17 LAB — RETICULOCYTES
Immature Retic Fract: 32.4 % — ABNORMAL HIGH (ref 2.3–15.9)
RBC.: 2.57 MIL/uL — ABNORMAL LOW (ref 3.87–5.11)
Retic Count, Absolute: 377 10*3/uL — ABNORMAL HIGH (ref 19.0–186.0)
Retic Ct Pct: 14.7 % — ABNORMAL HIGH (ref 0.4–3.1)

## 2018-10-17 MED ORDER — HYDROMORPHONE HCL 1 MG/ML IJ SOLN: 2.0000 mg | INTRAMUSCULAR | Status: AC

## 2018-10-17 MED ORDER — ACETAMINOPHEN 325 MG PO TABS
650.0000 mg | ORAL_TABLET | Freq: Once | ORAL | Status: AC
  Administered 2018-10-17: 650 mg via ORAL
  Filled 2018-10-17: qty 2

## 2018-10-17 MED ORDER — KETOROLAC TROMETHAMINE 30 MG/ML IJ SOLN
30.0000 mg | Freq: Once | INTRAMUSCULAR | Status: AC
  Administered 2018-10-17: 30 mg via INTRAVENOUS
  Filled 2018-10-17: qty 1

## 2018-10-17 MED ORDER — ONDANSETRON HCL 4 MG/2ML IJ SOLN: 4.0000 mg | INTRAMUSCULAR | Status: DC | PRN

## 2018-10-17 MED ORDER — HYDROMORPHONE HCL 1 MG/ML IJ SOLN
2.0000 mg | INTRAMUSCULAR | Status: AC
  Administered 2018-10-17: 2 mg via INTRAVENOUS
  Filled 2018-10-17: qty 2

## 2018-10-17 MED ORDER — DIPHENHYDRAMINE HCL 50 MG/ML IJ SOLN
25.0000 mg | Freq: Once | INTRAMUSCULAR | Status: AC
  Administered 2018-10-17: 25 mg via INTRAVENOUS
  Filled 2018-10-17: qty 1

## 2018-10-17 NOTE — ED Notes (Signed)
Bed: WA08 Expected date:  Expected time:  Means of arrival:  Comments: EMS-chest pain/back pain-sickle cell

## 2018-10-17 NOTE — ED Notes (Signed)
Pt continues to pull off BP cuff and leads, stating its hurting her too bad to keep them on.  Continuing to educate pt on importance of keeping these on.

## 2018-10-17 NOTE — ED Notes (Signed)
Patient returned from x-ray. X-Ray tech reports pt asked for pain med while there. Pt now back in room, snoring, VS WNL. Will inform provider and continue to monitor.

## 2018-10-17 NOTE — Discharge Instructions (Addendum)
Please stop using cocaine  Follow up with a primary care physician to manage your sickle cell disease

## 2018-10-17 NOTE — ED Notes (Signed)
MD spoke with pt regarding discharged. Before RN could enter room with AVS pt had removed her IV and started to walk out. Pt stopped to ensure removal of IV, RN offered to apply gauze to site, pt refused. Pt asked if she would come back to room for discharge VS and to sign for discharge. Pt refused. Pt also refused to take her AVS papers. Pt informed RN would make a note and shred AVS for privacy. Pt continued to walk out.

## 2018-10-17 NOTE — ED Notes (Signed)
Patient transported to X-ray 

## 2018-10-17 NOTE — ED Notes (Signed)
Patient moaning in pain and rocking back and forth in bed. Pt also eating [place] sandwich and drinking coke. Pt ambulatory to restroom for urine sample.

## 2018-10-17 NOTE — ED Notes (Signed)
Called pt to go back to room. No response.  

## 2018-10-17 NOTE — ED Triage Notes (Signed)
[facility] wall pain and lower back pain, tender to touch, sudden onset about 20 minutes ago. Hx of sickle cell but states pain feels different than typical crisis. Pt not sure when last [place] crisis was. Pt asked EMS for pain med multiple times. When EMS stated they were unable to give pain med at that time pt refused to answer majority or remaining questions. Denies n/v, dizziness, SOB or strenuous activity or recent injury.

## 2018-10-17 NOTE — ED Provider Notes (Signed)
[facility] EMERGENCY DEPARTMENT Provider Note   CSN: [id number] Arrival date & time: 10/17/18  1833   History   Chief Complaint Chief Complaint  Patient presents with  . Chest Pain  . Sickle Cell Pain Crisis    HPI [name] is a 22 y.o. female with past medical history significant for sickle cell, chronic headache, homelessness who presents for evaluation of pain.  Patient states her pain is of her typical sickle cell pain.  She has pain to her chest, bilateral upper extremities, lower back and lower legs.  States her pain is a 10/10.  Pain does not radiate.  Describes the pain as a dull aching.  Denies pleuritic chest pain.  She states the pain in her chest "hurts when I press on it."  States she has taken oxycodone and tramadol without relief of her symptoms.  Denies fever, chills, nausea, vomiting, cough, abdominal pain, diarrhea, dysuria.  Denies associated diaphoresis, radiation of chest pain, lightheaded or dizziness.  Patient states she was seen earlier today and was discharged and she was still in pain.  Patient states her pain has continued.  Denies additional aggravating or alleviating factors.  Denies IV drug use, history of malignancy, saddle paresthesia, bowel or bladder incontinence, unintended weight loss.  Patient states her pain is her "typical goal sickle cell pain."  Has known polysubstance abuse.  Patient states she does not have a PCP.   History obtained from patient.  No interpreter was used.  HPI  Past Medical History:  Diagnosis Date  . Acute kidney injury ([place]) 05/15/2016  . Amenorrhea   . Anemia    SICKLE CELL  . Asthma   . Drug-induced pruritus 02/26/2015  . GERD (gastroesophageal reflux disease)   . Headache   . Hyperbilirubinemia 02/26/2015  . Sickle cell disease [facility])     Patient Active Problem List   Diagnosis Date Noted  . Elevated liver enzymes 05/17/2018  . Sickle cell anemia ([place]) 05/16/2018  . Sickle cell crisis  ([place]) 05/16/2018  . Sickle cell anemia with crisis ([place]) 12/16/2017  . Substance abuse ([place]) 12/16/2017  . Homelessness 02/12/2017  . Protein-calorie malnutrition, severe 10/21/2016  . Anemia due to other cause   . Hb-SS disease without crisis ([place])   . Hereditary hemolytic anemia ([place])   . 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 Tobacco abuse   . Leukocytosis   . Sickle cell pain crisis ([place]) 02/15/2013  . Abdominal pain 07/25/2012  . Asthma, mild intermittent 12/14/2010    Past Surgical History:  Procedure Laterality Date  . 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 TONSILLECTOMY     Age 26     OB History   No obstetric history on file.      Home Medications    Prior to Admission medications   Medication Sig Start Date End Date Taking? Authorizing Provider  folic acid (FOLVITE) 1 MG tablet Take 1 tablet (1 mg total) by mouth daily. Patient not taking: Reported on 09/06/2018 05/20/18 05/20/19  Dorena Dew, FNP  gabapentin (NEURONTIN) 300 MG capsule Take 1 capsule (300 mg total) by mouth 3 (three) times daily. Patient not taking: Reported on 10/17/2018 09/11/18   Dorena Dew, FNP  ibuprofen (ADVIL,MOTRIN) 600 MG tablet Take 1 tablet (600 mg total) by mouth every 8 (eight) hours as needed. Patient not taking: Reported on 10/17/2018 09/11/18   Dorena Dew, FNP    Family  History Family History  Problem Relation Age of Onset  . Hypertension Paternal Grandfather   . Sickle cell trait Father   . Cancer Mother        Died in 02/17/2009    Social History Social History   Tobacco Use  . Smoking status: Current Every Day Smoker    Packs/day: 0.50    Years: 10.00    Pack years: 5.00    Types: Cigarettes  . Smokeless tobacco: Never Used  Substance Use Topics  . Alcohol use: No    Alcohol/week: 6.0 standard drinks    Types: 6 Shots of liquor per week    Comment: Denies 06/12/2014  . Drug use: Yes    Types: Marijuana, Cocaine      Allergies   Patient has no known allergies.   Review of Systems Review of Systems  Constitutional: Negative.   HENT: Negative.   Respiratory: Negative.   Cardiovascular: Positive for chest pain.  Gastrointestinal: Negative.   Genitourinary: Negative.   Musculoskeletal: Positive for back pain.       Bilateral upper and lower extremity pain.  Skin: Negative.   Neurological: Negative.   All other systems reviewed and are negative.    Physical Exam Updated Vital Signs BP 137/81   Pulse 64 Comment: pt refusing BP, ripping off cuff  Temp 97.8 F (36.6 C) (Oral)   Resp (!) 24   Ht 5\' 4"  (1.626 m)   Wt 61.2 kg   LMP  (LMP Unknown) Comment: pt too drowsy to answer.   SpO2 100% Comment: pt refusing BP, ripping off cuff  BMI 23.17 kg/m   Physical Exam Vitals signs and nursing note reviewed.  Constitutional:      General: She is not in acute distress.    Appearance: She is well-developed. She is not ill-appearing, toxic-appearing or diaphoretic.     Comments: Patient is moving around in bed grabbing her arms and her legs and her lower back stating "I am in pain"  HENT:     Head: Normocephalic and atraumatic.     Mouth/Throat:     Mouth: Mucous membranes are moist.  Eyes:     Pupils: Pupils are equal, round, and reactive to light.  Neck:     Musculoskeletal: Normal range of motion.     Comments: Full ROM without pain  Cardiovascular:     Rate and Rhythm: Normal rate.     Pulses: Normal pulses.     Heart sounds: Normal heart sounds. No murmur. No friction rub. No gallop.   Pulmonary:     Effort: Pulmonary effort is normal. No respiratory distress.     Breath sounds: Normal breath sounds. No stridor. No wheezing, rhonchi or rales.  Chest:     Chest wall: No tenderness.  Abdominal:     General: There is no distension.     Tenderness: There is no abdominal tenderness.  Musculoskeletal: Normal range of motion.     Right lower leg: No edema.     Left lower leg: No  edema.     Comments: All extremities without difficulty and without ataxia.  Patient is ambulatory in room without any difficulty.  No focal pain. Full range of motion of the T-spine and L-spine with flexion, hyperextension, and lateral flexion. No midline tenderness or stepoffs. No tenderness to palpation of the spinous processes of the T-spine or L-spine.  Negative straight  leg raise.  No midline tenderness to lumbar spine.  Skin:    General: Skin  is warm and dry.     Findings: No lesion or rash.  Neurological:     Mental Status: She is alert.     Comments:  Speech is clear and goal oriented, follows commands Normal 5/5 strength in upper and lower extremities bilaterally including dorsiflexion and plantar flexion, strong and equal grip strength Sensation normal to light and sharp touch Moves extremities without ataxia, coordination intact Normal gait Normal balance No Clonus    ED Treatments / Results  Labs (all labs ordered are listed, but only abnormal results are displayed) Labs Reviewed  COMPREHENSIVE METABOLIC PANEL - Abnormal; Notable for the following components:      Result Value   CO2 21 (*)    Glucose, Bld 129 (*)    Total Bilirubin 4.4 (*)    All other components within normal limits  RETICULOCYTES - Abnormal; Notable for the following components:   Retic Ct Pct 14.7 (*)    RBC. 2.57 (*)    Retic Count, Absolute 377.0 (*)    Immature Retic Fract 32.4 (*)    All other components within normal limits  CBC WITH DIFFERENTIAL/PLATELET  I-STAT BETA HCG BLOOD, ED ([facility], [facility], [facility] ONLY)  I-STAT TROPONIN, ED    EKG EKG Interpretation  Date/Time:  Thursday October 17 2018 18:47:02 EST Ventricular Rate:  62 PR Interval:  154 QRS Duration: 90 QT Interval:  430 QTC Calculation: 436 R Axis:   82 Text Interpretation:  Normal sinus rhythm with sinus arrhythmia Moderate voltage criteria for LVH, may be normal variant Nonspecific T wave abnormality Abnormal ECG Confirmed by  Fredia Sorrow [phone]) on 10/17/2018 9:52:06 PM   Radiology Dg Chest 2 View  Result Date: 10/17/2018 CLINICAL DATA:  Right-sided chest pain.  Sickle cell pain crisis. EXAM: CHEST - 2 VIEW COMPARISON:  Chest x-ray from same day at 11:13. FINDINGS: The patient is rotated to the right. The heart size and mediastinal contours are within normal limits. Normal pulmonary vascularity. No focal consolidation, pleural effusion, or pneumothorax. No acute osseous abnormality. IMPRESSION: No active cardiopulmonary disease. Electronically Signed   By: Titus Dubin M.D.   On: 10/17/2018 21:53   Dg Chest 2 View  Result Date: 10/17/2018 CLINICAL DATA:  Chest pain. EXAM: CHEST - 2 VIEW COMPARISON:  Radiographs of May 14, 2017. FINDINGS: The heart size and mediastinal contours are within normal limits. Both lungs are clear. No pneumothorax or pleural effusion is noted. The visualized skeletal structures are unremarkable. IMPRESSION: No active cardiopulmonary disease. Electronically Signed   By: Marijo Conception, M.D.   On: 10/17/2018 11:19    Procedures Procedures (including critical care time)  Medications Ordered in ED Medications  HYDROmorphone (DILAUDID) injection 2 mg ( Intravenous See Alternative 10/17/18 2337)    Or  HYDROmorphone (DILAUDID) injection 2 mg (2 mg Subcutaneous Not Given 10/17/18 2337)  ondansetron (ZOFRAN) injection 4 mg (has no administration in time range)  HYDROmorphone (DILAUDID) injection 2 mg (2 mg Intravenous Given 10/17/18 2117)    Or  HYDROmorphone (DILAUDID) injection 2 mg ( Subcutaneous See Alternative 10/17/18 2117)  HYDROmorphone (DILAUDID) injection 2 mg (2 mg Intravenous Given 10/17/18 2234)    Or  HYDROmorphone (DILAUDID) injection 2 mg ( Subcutaneous See Alternative 10/17/18 2234)  diphenhydrAMINE (BENADRYL) injection 25 mg (25 mg Intravenous Given 10/17/18 2106)  ketorolac (TORADOL) 30 MG/ML injection 30 mg (30 mg Intravenous Given 10/17/18 2234)      Initial Impression / Assessment and Plan / ED Course  I have reviewed the  triage vital signs and the nursing notes.  Pertinent labs & imaging results that were available during my care of the patient were reviewed by me and considered in my medical decision making (see chart for details).  22 year old female who appears otherwise well presents for evaluation of sickle cell pain crisis.  Patient states her pain is typical of her sickle cell pain.  Pain located to her bilateral upper and lower extremities, chest and lower back.  Patient was seen earlier today, however was drowsy and was difficult to awaken.  Patient does have known substance use.  Was [place] home at that time.  Patient presents with increasing pain.  Patient did have chest pain at that time, however was found to have a chest wall pain.  Low suspicion for ACS at that time.  She has been taking her home oxycodone and tramadol for pain without relief of symptoms.  Nonfocal neurologic exam without neurologic deficits.  Given patient has chest pain will do chest pain rule out as well as provide pain management for sickle cell crisis and reevaluate.  hCG negative, troponin negative, metabolic panel with mild elevation in glucose at 129, bilirubin 4.4, increased retic count.  Chest x-ray negative. CBC from earlier today with WBC at 19, however  On reevaluation patient has received 2 doses of pain medicine.  Patient voices improvement in pain.  Patient only arousable to loud voice.  Patient falls asleep when asking her questions.  Answers very simple questions with only 1 word answers.  Moves all 4 extremities without difficulty.  Patient extremely difficult to arouse on reevaluation.  I do not feel that patient needs additional doses of pain medicine at this time given somnolense.  Given her ongoing polysubstance use as well as her difficulty with arousal do not think she needs to be DC'd home with additional opioid pain medicine.  She is  hemodynamically stable.  Low suspicion for ACS, PE, dissection causing patient's symptoms.  Patient is hemodynamically stable.  Will need to be monitored given increased somnolence.  Anticipate DC home with follow-up with sickle cell clinic.  Patient care transferred to Lorre Munroe, PA-C at shift change, who will determine ultimate disposition of patient.      Final Clinical Impressions(s) / ED Diagnoses   Final diagnoses:  Sickle cell anemia with pain [facility])    ED Discharge Orders    None       Hugo Lybrand A, PA-C 10/18/18 0021    Fredia Sorrow, MD 10/18/18 2333

## 2018-10-17 NOTE — ED Provider Notes (Signed)
[facility] DEPT Provider Note   CSN: [id number] Arrival date & time: 10/17/18  1021     History   Chief Complaint Chief Complaint  Patient presents with  . Chest Pain    chest wall pain  . Back Pain    mid to lower    Level 5 caveat: Altered mental status  HPI ZIAN MOHAMED is a 22 y.o. female.  HPI Patient is a 22 year old female with a history of sickle cell disease who presents to the emergency department via EMS.  For EMS she reportedly described onset of anterior chest discomfort with moderate to severe pain.  Patient came from a friend's house.  Patient is unable to stay awake for the history.  She requires stimulation in order to awake when she awakes she reports her chest hurts and then she falls back off to sleep and began snoring.  Per the medical record the patient has a longstanding history of noncompliance with medications and noncompliance with follow-up appointments.  She currently is without a primary care physician.  She has no reported pain medication at home for breakthrough pain.  She is known to abuse cocaine with her last 3 urine drug screens being positive for cocaine.  Her vital signs are stable on arrival to the emergency department.  No meds given by EMS   Past Medical History:  Diagnosis Date  . Acute kidney injury ([place]) 05/15/2016  . Amenorrhea   . Anemia    SICKLE CELL  . Asthma   . Drug-induced pruritus 02/26/2015  . GERD (gastroesophageal reflux disease)   . Headache   . Hyperbilirubinemia 02/26/2015  . Sickle cell disease [facility])     Patient Active Problem List   Diagnosis Date Noted  . Elevated liver enzymes 05/17/2018  . Sickle cell anemia ([place]) 05/16/2018  . Sickle cell crisis ([place]) 05/16/2018  . Sickle cell anemia with crisis ([place]) 12/16/2017  . Substance abuse ([place]) 12/16/2017  . Homelessness 02/12/2017  . Protein-calorie malnutrition, severe 10/21/2016  . Anemia due to other cause   . Hb-SS  disease without crisis ([place])   . Hereditary hemolytic anemia ([place])   . 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 Tobacco abuse   . Leukocytosis   . Sickle cell pain crisis ([place]) 02/15/2013  . Abdominal pain 07/25/2012  . Asthma, mild intermittent 12/14/2010    Past Surgical History:  Procedure Laterality Date  . 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 TONSILLECTOMY     Age 70     OB History   No obstetric history on file.      Home Medications    Prior to Admission medications   Medication Sig Start Date End Date Taking? Authorizing Provider  folic acid (FOLVITE) 1 MG tablet Take 1 tablet (1 mg total) by mouth daily. Patient not taking: Reported on 09/06/2018 05/20/18 05/20/19  Dorena Dew, FNP  gabapentin (NEURONTIN) 300 MG capsule Take 1 capsule (300 mg total) by mouth 3 (three) times daily. Patient not taking: Reported on 10/17/2018 09/11/18   Dorena Dew, FNP  ibuprofen (ADVIL,MOTRIN) 600 MG tablet Take 1 tablet (600 mg total) by mouth every 8 (eight) hours as needed. Patient not taking: Reported on 10/17/2018 09/11/18   Dorena Dew, FNP    Family History Family History  Problem Relation Age of Onset  . Hypertension Paternal Grandfather   . Sickle cell trait Father   . Cancer Mother  Died in 2010    Social History Social History   Tobacco Use  . Smoking status: Current Every Day Smoker    Packs/day: 0.50    Years: 10.00    Pack years: 5.00    Types: Cigarettes  . Smokeless tobacco: Never Used  Substance Use Topics  . Alcohol use: No    Alcohol/week: 6.0 standard drinks    Types: 6 Shots of liquor per week    Comment: Denies 06/12/2014  . Drug use: Yes    Types: Marijuana, Cocaine     Allergies   Patient has no known allergies.   Review of Systems Review of Systems  Unable to perform ROS: Mental status change     Physical Exam Updated Vital Signs BP 129/85   Pulse (!) 57   Temp 97.8 F  (36.6 C)   Resp (!) 30   Ht 5\' 5"  (1.651 m)   Wt 56.7 kg   LMP  (LMP Unknown) Comment: pt too drowsy to answer.   SpO2 100%   BMI 20.80 kg/m   Physical Exam Vitals signs and nursing note reviewed.  Constitutional:      General: She is not in acute distress.    Appearance: She is well-developed.  HENT:     Head: Normocephalic and atraumatic.  Neck:     Musculoskeletal: Normal range of motion.  Cardiovascular:     Rate and Rhythm: Normal rate and regular rhythm.     Heart sounds: Normal heart sounds.  Pulmonary:     Effort: Pulmonary effort is normal.     Breath sounds: Normal breath sounds.  Chest:     Chest wall: Tenderness present.  Abdominal:     General: There is no distension.     Palpations: Abdomen is soft.     Tenderness: There is no abdominal tenderness.  Musculoskeletal: Normal range of motion.     Right lower leg: No edema.     Left lower leg: No edema.  Skin:    General: Skin is warm and dry.  Neurological:     Mental Status: She is alert.     Comments: Opens eyes to loud voice.  Answers very simple questions with one-word answers.  Noted to move all 4 extremities spontaneously  Psychiatric:        Judgment: Judgment normal.      ED Treatments / Results  Labs (all labs ordered are listed, but only abnormal results are displayed) Labs Reviewed  CBC - Abnormal; Notable for the following components:      Result Value   WBC 19.4 (*)    RBC 2.62 (*)    Hemoglobin 9.5 (*)    HCT 27.4 (*)    MCV 104.6 (*)    MCH 36.3 (*)    RDW 18.8 (*)    Platelets 558 (*)    nRBC 3.5 (*)    All other components within normal limits  COMPREHENSIVE METABOLIC PANEL - Abnormal; Notable for the following components:   CO2 21 (*)    Glucose, Bld 105 (*)    Total Bilirubin 4.3 (*)    All other components within normal limits  ETHANOL  RAPID URINE DRUG SCREEN, HOSP PERFORMED  I-STAT TROPONIN, ED  I-STAT BETA HCG BLOOD, ED ([facility], [facility], [facility] ONLY)    EKG EKG  Interpretation  Date/Time:  Thursday October 17 2018 10:55:39 EST Ventricular Rate:  71 PR Interval:    QRS Duration: 90 QT Interval:  408 QTC Calculation: 444 R  Axis:   78 Text Interpretation:  Sinus rhythm Probable LVH with secondary repol abnrm No significant change was found Confirmed by Jola Schmidt [phone]) on 10/17/2018 12:33:44 PM   Radiology Dg Chest 2 View  Result Date: 10/17/2018 CLINICAL DATA:  Chest pain. EXAM: CHEST - 2 VIEW COMPARISON:  Radiographs of May 14, 2017. FINDINGS: The heart size and mediastinal contours are within normal limits. Both lungs are clear. No pneumothorax or pleural effusion is noted. The visualized skeletal structures are unremarkable. IMPRESSION: No active cardiopulmonary disease. Electronically Signed   By: Marijo Conception, M.D.   On: 10/17/2018 11:19    Procedures Procedures (including critical care time)  Medications Ordered in ED Medications  acetaminophen (TYLENOL) tablet 650 mg (650 mg Oral Given 10/17/18 1210)  ketorolac (TORADOL) 30 MG/ML injection 30 mg (30 mg Intravenous Given 10/17/18 1210)     Initial Impression / Assessment and Plan / ED Course  I have reviewed the triage vital signs and the nursing notes.  Pertinent labs & imaging results that were available during my care of the patient were reviewed by me and considered in my medical decision making (see chart for details).     Patient initially extremely difficult to arouse here in the emergency department.  My comfort level prescribing opioid pain medication for anterior chest wall pain versus sickle cell related pain is very low.  Given her ongoing substance abuse and her difficulty arousing on arrival the emergency department I do not think opioid medication is warranted.  She is more awake now and is tearful.  Her vital signs are stable.  Doubt ACS.  Doubt PE.  Doubt dissection.  No signs of acute chest on chest x-ray.  Labs including her hemoglobin and white count are  baseline for the patient.  At this time I feels though medical screen examination is been complete.  The patient continues to complain of pain but I do not think she needs opioid pain medication at this time.  She was given Tylenol and Toradol.  She has been referred to primary care.  No life-threatening emergency present  Final Clinical Impressions(s) / ED Diagnoses   Final diagnoses:  Chest wall pain    ED Discharge Orders    None       Jola Schmidt, MD 10/17/18 1300

## 2018-10-17 NOTE — ED Notes (Signed)
Patient is requesting [employer] to drink, however patient  is now beginning to cry and scream stating it hurts, it hurts. RN asked patient what is hurting and patient is bent over on bed standing stating her back, chest and arms hurt.

## 2018-10-17 NOTE — ED Triage Notes (Signed)
Pt from home with chest pain and sickle cell pain. Seen earlier and pain still unresolved.  A&Ox4 ambulatory to triage.

## 2018-10-17 NOTE — ED Notes (Signed)
EMS reports pt was seen x1 month by MD who gave her RX for pain med but pt states she has not filled it.

## 2018-10-17 NOTE — ED Notes (Signed)
Pt back from X-ray.  

## 2018-10-17 NOTE — ED Notes (Signed)
Pt appears too drowsy to answer most questions well. Pt initially moaning and resistant to assist RN in putting gown on pt. Pt would not verbally rate her pain. When asked if pt took any medications PTA she shook her head no, when asked again she said "yes, Advil". Shortly after that pt began snoring. Pt woken by RN in attempt to finish Triage questions, was only able to give some yes/ now answers. Pt now transported to x-ray.

## 2018-10-18 ENCOUNTER — Other Ambulatory Visit: Payer: Self-pay

## 2018-10-18 ENCOUNTER — Emergency Department (HOSPITAL_COMMUNITY)
Admission: EM | Admit: 2018-10-18 | Discharge: 2018-10-18 | Disposition: A | Discharge status: Home / Self Care | Payer: Medicare Other | Source: Home / Self Care | Attending: Emergency Medicine | Admitting: Emergency Medicine

## 2018-10-18 ENCOUNTER — Inpatient Hospital Stay (HOSPITAL_COMMUNITY)
Admission: AD | Admit: 2018-10-18 | Discharge: 2018-10-24 | DRG: 812 | Disposition: A | Discharge status: Home / Self Care | Payer: Medicare Other | Attending: Internal Medicine | Admitting: Internal Medicine

## 2018-10-18 ENCOUNTER — Encounter (HOSPITAL_COMMUNITY): Payer: Self-pay

## 2018-10-18 ENCOUNTER — Ambulatory Visit (HOSPITAL_COMMUNITY): Admission: RE | Admit: 2018-10-18 | Payer: Self-pay | Source: Ambulatory Visit

## 2018-10-18 ENCOUNTER — Emergency Department (HOSPITAL_COMMUNITY)
Admission: EM | Admit: 2018-10-18 | Discharge: 2018-10-18 | Disposition: A | Discharge status: Home / Self Care | Payer: Medicare Other | Attending: Emergency Medicine | Admitting: Emergency Medicine

## 2018-10-18 DIAGNOSIS — F1410 Cocaine abuse, uncomplicated: Secondary | ICD-10-CM

## 2018-10-18 DIAGNOSIS — Z590 Homelessness: Secondary | ICD-10-CM

## 2018-10-18 DIAGNOSIS — D5700 Hb-SS disease with crisis, unspecified: Principal | ICD-10-CM | POA: Diagnosis present

## 2018-10-18 DIAGNOSIS — J45909 Unspecified asthma, uncomplicated: Secondary | ICD-10-CM | POA: Insufficient documentation

## 2018-10-18 DIAGNOSIS — F411 Generalized anxiety disorder: Secondary | ICD-10-CM

## 2018-10-18 DIAGNOSIS — D571 Sickle-cell disease without crisis: Secondary | ICD-10-CM | POA: Diagnosis present

## 2018-10-18 DIAGNOSIS — K219 Gastro-esophageal reflux disease without esophagitis: Secondary | ICD-10-CM | POA: Diagnosis present

## 2018-10-18 DIAGNOSIS — F1414 Cocaine abuse with cocaine-induced mood disorder: Secondary | ICD-10-CM | POA: Diagnosis present

## 2018-10-18 DIAGNOSIS — G894 Chronic pain syndrome: Secondary | ICD-10-CM | POA: Diagnosis present

## 2018-10-18 DIAGNOSIS — D638 Anemia in other chronic diseases classified elsewhere: Secondary | ICD-10-CM

## 2018-10-18 DIAGNOSIS — F17210 Nicotine dependence, cigarettes, uncomplicated: Secondary | ICD-10-CM | POA: Diagnosis present

## 2018-10-18 DIAGNOSIS — Z9119 Patient's noncompliance with other medical treatment and regimen: Secondary | ICD-10-CM

## 2018-10-18 DIAGNOSIS — J4520 Mild intermittent asthma, uncomplicated: Secondary | ICD-10-CM | POA: Insufficient documentation

## 2018-10-18 DIAGNOSIS — F1494 Cocaine use, unspecified with cocaine-induced mood disorder: Secondary | ICD-10-CM

## 2018-10-18 DIAGNOSIS — D72829 Elevated white blood cell count, unspecified: Secondary | ICD-10-CM

## 2018-10-18 DIAGNOSIS — Z832 Family history of diseases of the blood and blood-forming organs and certain disorders involving the immune mechanism: Secondary | ICD-10-CM

## 2018-10-18 DIAGNOSIS — F1110 Opioid abuse, uncomplicated: Secondary | ICD-10-CM | POA: Diagnosis present

## 2018-10-18 LAB — CBC
HCT: 22.9 % — ABNORMAL LOW (ref 36.0–46.0)
Hemoglobin: 7.9 g/dL — ABNORMAL LOW (ref 12.0–15.0)
MCH: 36.1 pg — ABNORMAL HIGH (ref 26.0–34.0)
MCHC: 34.5 g/dL (ref 30.0–36.0)
MCV: 104.6 fL — ABNORMAL HIGH (ref 80.0–100.0)
Platelets: 497 10*3/uL — ABNORMAL HIGH (ref 150–400)
RBC: 2.19 MIL/uL — ABNORMAL LOW (ref 3.87–5.11)
RDW: 19.4 % — ABNORMAL HIGH (ref 11.5–15.5)
WBC: 26.2 10*3/uL — ABNORMAL HIGH (ref 4.0–10.5)
nRBC: 6.8 % — ABNORMAL HIGH (ref 0.0–0.2)

## 2018-10-18 LAB — URINALYSIS, ROUTINE W REFLEX MICROSCOPIC
Bilirubin Urine: NEGATIVE
Glucose, UA: NEGATIVE mg/dL
Ketones, ur: NEGATIVE mg/dL
Nitrite: NEGATIVE
Protein, ur: NEGATIVE mg/dL
Specific Gravity, Urine: 1.015 (ref 1.005–1.030)
pH: 6 (ref 5.0–8.0)

## 2018-10-18 LAB — CREATININE, SERUM
Creatinine, Ser: 0.8 mg/dL (ref 0.44–1.00)
GFR calc Af Amer: >60 mL/min (ref >60)
GFR calc non Af Amer: >60 mL/min (ref >60)

## 2018-10-18 LAB — CBC WITH DIFFERENTIAL/PLATELET
BAND NEUTROPHILS: 0 %
Basophils Absolute: 0 10*3/uL (ref 0.0–0.1)
Basophils Relative: 0 %
Eosinophils Absolute: 0 10*3/uL (ref 0.0–0.5)
Eosinophils Relative: 0 %
HCT: 23.6 % — ABNORMAL LOW (ref 36.0–46.0)
Hemoglobin: 8.3 g/dL — ABNORMAL LOW (ref 12.0–15.0)
Lymphocytes Relative: 23 %
Lymphs Abs: 6.4 10*3/uL — ABNORMAL HIGH (ref 0.7–4.0)
MCH: 35 pg — ABNORMAL HIGH (ref 26.0–34.0)
MCHC: 35.2 g/dL (ref 30.0–36.0)
MCV: 99.6 fL (ref 80.0–100.0)
Monocytes Absolute: 1.4 10*3/uL — ABNORMAL HIGH (ref 0.1–1.0)
Monocytes Relative: 5 %
Neutro Abs: 16.6 10*3/uL — ABNORMAL HIGH (ref 1.7–7.7)
Neutrophils Relative %: 60 %
Platelets: 501 10*3/uL — ABNORMAL HIGH (ref 150–400)
RBC: 2.37 MIL/uL — ABNORMAL LOW (ref 3.87–5.11)
RDW: 18.4 % — ABNORMAL HIGH (ref 11.5–15.5)
WBC: 27.6 10*3/uL — ABNORMAL HIGH (ref 4.0–10.5)
nRBC: 5.3 % — ABNORMAL HIGH (ref 0.0–0.2)

## 2018-10-18 LAB — URINALYSIS, MICROSCOPIC (REFLEX)

## 2018-10-18 LAB — LACTATE DEHYDROGENASE: LDH: 546 U/L — ABNORMAL HIGH (ref 98–192)

## 2018-10-18 MED ORDER — ONDANSETRON HCL 4 MG/2ML IJ SOLN: 4.0000 mg | INTRAMUSCULAR | Status: DC | PRN

## 2018-10-18 MED ORDER — POLYETHYLENE GLYCOL 3350 17 G PO PACK: 17.0000 g | PACK | Freq: Every day | ORAL | Status: DC | PRN

## 2018-10-18 MED ORDER — SODIUM CHLORIDE 0.45 % IV SOLN
INTRAVENOUS | Status: DC
  Administered 2018-10-18 – 2018-10-20 (×6): via INTRAVENOUS

## 2018-10-18 MED ORDER — HYDROMORPHONE HCL 1 MG/ML IJ SOLN
0.5000 mg | Freq: Once | INTRAMUSCULAR | Status: AC
  Administered 2018-10-18: 0.5 mg via SUBCUTANEOUS
  Filled 2018-10-18: qty 1

## 2018-10-18 MED ORDER — SODIUM CHLORIDE 0.45 % IV SOLN: INTRAVENOUS | Status: DC

## 2018-10-18 MED ORDER — KETOROLAC TROMETHAMINE 15 MG/ML IJ SOLN: 15.0000 mg | Freq: Four times a day (QID) | INTRAMUSCULAR | Status: DC

## 2018-10-18 MED ORDER — SODIUM CHLORIDE 0.45 % IV BOLUS: 1000.0000 mL | Freq: Once | INTRAVENOUS | Status: DC

## 2018-10-18 MED ORDER — ACETAMINOPHEN 500 MG PO TABS
1000.0000 mg | ORAL_TABLET | Freq: Four times a day (QID) | ORAL | Status: DC | PRN
  Administered 2018-10-19 – 2018-10-23 (×6): 1000 mg via ORAL
  Filled 2018-10-18 (×7): qty 2

## 2018-10-18 MED ORDER — OXYCODONE HCL 5 MG PO TABS
10.0000 mg | ORAL_TABLET | Freq: Once | ORAL | Status: AC
  Administered 2018-10-18: 10 mg via ORAL
  Filled 2018-10-18: qty 2

## 2018-10-18 MED ORDER — HYDROMORPHONE HCL 1 MG/ML IJ SOLN
1.0000 mg | Freq: Once | INTRAMUSCULAR | Status: AC
  Administered 2018-10-18: 1 mg via SUBCUTANEOUS
  Filled 2018-10-18: qty 1

## 2018-10-18 MED ORDER — ACETAMINOPHEN 500 MG PO TABS
1000.0000 mg | ORAL_TABLET | Freq: Once | ORAL | Status: AC
  Administered 2018-10-18: 1000 mg via ORAL
  Filled 2018-10-18: qty 2

## 2018-10-18 MED ORDER — HYDROMORPHONE HCL 1 MG/ML IJ SOLN: 2.0000 mg | Freq: Four times a day (QID) | INTRAMUSCULAR | Status: DC | PRN

## 2018-10-18 MED ORDER — ONDANSETRON HCL 4 MG PO TABS: 4.0000 mg | ORAL_TABLET | ORAL | Status: DC | PRN

## 2018-10-18 MED ORDER — SENNOSIDES-DOCUSATE SODIUM 8.6-50 MG PO TABS
1.0000 | ORAL_TABLET | Freq: Two times a day (BID) | ORAL | Status: DC
  Administered 2018-10-18 – 2018-10-22 (×9): 1 via ORAL
  Filled 2018-10-18 (×11): qty 1

## 2018-10-18 MED ORDER — KETOROLAC TROMETHAMINE 15 MG/ML IJ SOLN
15.0000 mg | Freq: Four times a day (QID) | INTRAMUSCULAR | Status: AC
  Administered 2018-10-18 – 2018-10-23 (×20): 15 mg via INTRAVENOUS
  Filled 2018-10-18 (×20): qty 1

## 2018-10-18 MED ORDER — HYDROMORPHONE HCL 1 MG/ML IJ SOLN
1.0000 mg | Freq: Four times a day (QID) | INTRAMUSCULAR | Status: DC | PRN
  Administered 2018-10-18 – 2018-10-24 (×18): 1 mg via SUBCUTANEOUS
  Filled 2018-10-18 (×18): qty 1

## 2018-10-18 MED ORDER — HYDROXYZINE HCL 10 MG PO TABS
10.0000 mg | ORAL_TABLET | ORAL | Status: DC | PRN
  Administered 2018-10-18: 10 mg via ORAL
  Filled 2018-10-18 (×2): qty 1

## 2018-10-18 MED ORDER — ONDANSETRON HCL 4 MG PO TABS: 4.0000 mg | ORAL_TABLET | Freq: Once | ORAL | Status: DC

## 2018-10-18 MED ORDER — OXYCODONE HCL 5 MG PO TABS
10.0000 mg | ORAL_TABLET | ORAL | Status: DC
  Administered 2018-10-18 – 2018-10-21 (×15): 10 mg via ORAL
  Filled 2018-10-18 (×16): qty 2

## 2018-10-18 MED ORDER — ENOXAPARIN SODIUM 40 MG/0.4ML ~~LOC~~ SOLN
40.0000 mg | SUBCUTANEOUS | Status: DC
  Administered 2018-10-21 – 2018-10-22 (×2): 40 mg via SUBCUTANEOUS
  Filled 2018-10-18 (×3): qty 0.4

## 2018-10-18 NOTE — ED Notes (Signed)
Patient now tearful, crying out in pain.   MD made aware.

## 2018-10-18 NOTE — ED Notes (Signed)
Pt is refusing to sign for discharge and is refusing to leave.

## 2018-10-18 NOTE — Progress Notes (Signed)
Patient came to the waiting room of Patient [name] for triage. Patient reports pain in arms, chest and back rated 10/10. Patient recently came from [place]. Denies fever, nausea, vomiting, diarrhea and abdominal pain. Patient reports the she does not have any prescribed pain medications.  [place], [place] notified. Patient to come to the day hospital for pain management.

## 2018-10-18 NOTE — ED Notes (Signed)
Patient escorted out by security and [employer]. All of patient's belongings with her at this time.

## 2018-10-18 NOTE — Discharge Instructions (Addendum)
These follow-up with your primary care provider for health preventative care and maintenance.  You can go to the Sickle Cell clinic if you are unable to control your pain control with ibuprofen and Tylenol at home. They open at 8 AM.   Return to the emergency department if you develop high fever, new numbness or weakness, changes in your vision, significant shortness of breath, or other new, concerning symptoms.

## 2018-10-18 NOTE — ED Notes (Addendum)
Pt is refusing this Probation officer to start IV access. Pt is thrashing around in bed, and throwing the blanket over herself. Writer advised to pt that she needs in IV for admission and fluids.

## 2018-10-18 NOTE — ED Triage Notes (Signed)
Patient has been seen for same complaint at [facility] within the last few hours. Patient left AMA yesterday from this facility and refused admission at [facility] this morning. +sickle cell pain all over.

## 2018-10-18 NOTE — Progress Notes (Signed)
Patient admitted to the day infusion hospital from [facility] for sickle cell pain crisis. Patient initially reported generalized pain rated 10/10. For pain pain management, patient given a total of 1.5 mg Dilaudid sub-q, 20 mg Oxycodone, 1000 mg Tylenol, 15 mg Toradol and patient hydrated with IV fluids. Patient continued to endorse pain rated 7/10. [place], [place] notified. Orders received to transfer patient to inpatient unit. Report called to [address] [place]. Patient transferred to [address] in wheelchair. Alert, oriented and stable at transfer.

## 2018-10-18 NOTE — ED Provider Notes (Signed)
[facility] DEPT Provider Note   CSN: [id number] Arrival date & time: 10/18/18  0424     History   Chief Complaint Chief Complaint  Patient presents with  . Sickle Cell Pain Crisis    HPI [name] is a 22 y.o. female.  Level V caveat, pt is agitated.   Pt presenting with sickle cell pain crisis. Pt was seen x2 yesterday in the ED asking for pain medication. Pt was offered admission at [facility] x3 staff 1.5 hrs ago, and refused at that time. Notes, labs, and imaging reviewed from previous 2 visits. Pt with increased WBC from 19 to 27. CXR negative x2, UA negative. UDS positive for cocaine yesterday. When asked why she didn't want to be admitted, pt states "because they tripping." she is unable to tell me when her pain started, where it is, or if she has associated symptoms. Pt states she went to a friends house after she left [place]. She denies using drugs between hospital visits.   HPI  Past Medical History:  Diagnosis Date  . Acute kidney injury ([place]) 05/15/2016  . Amenorrhea   . Anemia    SICKLE CELL  . Asthma   . Drug-induced pruritus 02/26/2015  . GERD (gastroesophageal reflux disease)   . Headache   . Hyperbilirubinemia 02/26/2015  . Sickle cell disease [facility])     Patient Active Problem List   Diagnosis Date Noted  . Elevated liver enzymes 05/17/2018  . Sickle cell anemia ([place]) 05/16/2018  . Sickle cell crisis ([place]) 05/16/2018  . Sickle cell anemia with crisis ([place]) 12/16/2017  . Substance abuse ([place]) 12/16/2017  . Homelessness 02/12/2017  . Protein-calorie malnutrition, severe 10/21/2016  . Anemia due to other cause   . Hb-SS disease without crisis ([place])   . Hereditary hemolytic anemia ([place])   . 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 Tobacco abuse   . Leukocytosis   . Sickle cell pain crisis ([place]) 02/15/2013  . Abdominal pain 07/25/2012  . Asthma, mild intermittent 12/14/2010      Past Surgical History:  Procedure Laterality Date  . 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 TONSILLECTOMY     Age 63     OB History   No obstetric history on file.      Home Medications    Prior to Admission medications   Medication Sig Start Date End Date Taking? Authorizing Provider  folic acid (FOLVITE) 1 MG tablet Take 1 tablet (1 mg total) by mouth daily. Patient not taking: Reported on 09/06/2018 05/20/18 05/20/19  Dorena Dew, FNP  gabapentin (NEURONTIN) 300 MG capsule Take 1 capsule (300 mg total) by mouth 3 (three) times daily. Patient not taking: Reported on 10/17/2018 09/11/18   Dorena Dew, FNP  ibuprofen (ADVIL,MOTRIN) 600 MG tablet Take 1 tablet (600 mg total) by mouth every 8 (eight) hours as needed. Patient not taking: Reported on 10/17/2018 09/11/18   Dorena Dew, FNP    Family History Family History  Problem Relation Age of Onset  . Hypertension Paternal Grandfather   . Sickle cell trait Father   . Cancer Mother        Died in 11-Feb-2009    Social History Social History   Tobacco Use  . Smoking status: Current Every Day Smoker    Packs/day: 0.50    Years: 10.00    Pack years: 5.00    Types: Cigarettes  .  Smokeless tobacco: Never Used  Substance Use Topics  . Alcohol use: No    Alcohol/week: 6.0 standard drinks    Types: 6 Shots of liquor per week    Comment: Denies 06/12/2014  . Drug use: Yes    Types: Marijuana, Cocaine     Allergies   Patient has no known allergies.   Review of Systems Review of Systems  Unable to perform ROS: Other  Musculoskeletal: Positive for myalgias.   Pt is altered  Physical Exam Updated Vital Signs BP 134/86   Pulse (!) 52   Resp 20   Ht 5\' 4"  (1.626 m)   Wt 61.2 kg   LMP  (LMP Unknown) Comment: pt too drowsy to answer.   SpO2 100%   BMI 23.17 kg/m   Physical Exam Vitals signs and nursing note reviewed.  Constitutional:      General: She is not in acute distress.     Appearance: She is well-developed.     Comments: Pt appears dehydrated but nontoxic  HENT:     Head: Normocephalic and atraumatic.     Comments: MM moist    Mouth/Throat:     Mouth: Mucous membranes are dry.     Comments: MM dry Eyes:     Conjunctiva/sclera: Conjunctivae normal.     Pupils: Pupils are equal, round, and reactive to light.  Neck:     Musculoskeletal: Normal range of motion and neck supple.  Cardiovascular:     Rate and Rhythm: Normal rate and regular rhythm.  Pulmonary:     Effort: Pulmonary effort is normal. No respiratory distress.     Breath sounds: Normal breath sounds. No wheezing.     Comments: Clear lung sounds in all fields Abdominal:     General: There is no distension.     Palpations: Abdomen is soft.     Tenderness: There is no abdominal tenderness.  Musculoskeletal: Normal range of motion.  Skin:    General: Skin is warm and dry.     Capillary Refill: Capillary refill takes less than 2 seconds.  Neurological:     Mental Status: She is alert.     GCS: GCS eye subscore is 4. GCS verbal subscore is 4. GCS motor subscore is 6.     Comments: Pt will fall asleep mid-sentence, and then suddenly become very agitated and hyperactive      ED Treatments / Results  Labs (all labs ordered are listed, but only abnormal results are displayed) Labs Reviewed - No data to display  EKG None  Radiology Dg Chest 2 View  Result Date: 10/17/2018 CLINICAL DATA:  Right-sided chest pain.  Sickle cell pain crisis. EXAM: CHEST - 2 VIEW COMPARISON:  Chest x-ray from same day at 11:13. FINDINGS: The patient is rotated to the right. The heart size and mediastinal contours are within normal limits. Normal pulmonary vascularity. No focal consolidation, pleural effusion, or pneumothorax. No acute osseous abnormality. IMPRESSION: No active cardiopulmonary disease. Electronically Signed   By: Titus Dubin M.D.   On: 10/17/2018 21:53   Dg Chest 2 View  Result Date:  10/17/2018 CLINICAL DATA:  Chest pain. EXAM: CHEST - 2 VIEW COMPARISON:  Radiographs of May 14, 2017. FINDINGS: The heart size and mediastinal contours are within normal limits. Both lungs are clear. No pneumothorax or pleural effusion is noted. The visualized skeletal structures are unremarkable. IMPRESSION: No active cardiopulmonary disease. Electronically Signed   By: Marijo Conception, M.D.   On: 10/17/2018 11:19  Procedures Procedures (including critical care time)  Medications Ordered in ED Medications  sodium chloride 0.45 % bolus 1,000 mL (has no administration in time range)  HYDROmorphone (DILAUDID) injection 1 mg (1 mg Subcutaneous Given 10/18/18 0546)     Initial Impression / Assessment and Plan / ED Course  I have reviewed the triage vital signs and the nursing notes.  Pertinent labs & imaging results that were available during my care of the patient were reviewed by me and considered in my medical decision making (see chart for details).     Patient return to the ER for the third time in 24 hours for pain.  Patient left [facility] an hour and a half ago after refusing admission.  Upon evaluation, patient alternating between falling asleep mid sentence and becoming agitated and hyperactive with aggressive behavior.  I am concerned about drug-seeking behavior.  However, reviewed previous notes and labs, which did show increasing white count.  This may be reactive, as there is no obvious source.  Patient does appear dehydrated, will order fluid and obtain UDS.   Informed by RN that pt is refusing IV. As pt was [facility] for [facility] at [facility], will call for admission.   Discussed with Dr. Tamala Julian, who deferred admission until further pain protocol was done in the ED.  Pt becoming more and more agitated. refusing workup/IV. Discussed with attending, Dr. Stark Jock agrees to plan. As pt is displaying drug seeking behavior, I am concerning about given her 3 rounds of narcotics  in the ED, will give 1 mg dilaudid subq and have pt f/u with sickle cell clinic as needed in 2 hrs. Discussed with pt, who agrees to plan.   Final Clinical Impressions(s) / ED Diagnoses   Final diagnoses:  Sickle cell pain crisis [facility])    ED Discharge Orders    None       Franchot Heidelberg, PA-C 10/18/18 6712    Veryl Speak, MD 10/18/18 [phone]

## 2018-10-18 NOTE — ED Notes (Signed)
Patient walking throughout ED crying out in pain. States "yall haven't given me anything since I've been here. You're not doing anything for me. I want to go. Just let me go". MD aware of patient wanting to leave. Patient up for discharged.

## 2018-10-18 NOTE — ED Notes (Signed)
RN transported patient to sickle cell clinic via wheelchair at this time.

## 2018-10-18 NOTE — H&P (Signed)
H&P  [name], is a 22 y.o. female  MRN: [id number]   DOB - [birth year]  Admit Date - 10/18/2018  Outpatient Primary MD for the patient is Dorena Dew, FNP  No chief complaint on file.     HPI:   [name]  is a 22 y.o. female with a medical history significant for sickle cell anemia, chronic pain syndrome, asthma, homelessness, and cocaine abuse presents complaining of generalized pain for 3 days.  Patient states the pain is primarily to chest, back, and lower extremities.  Patient has been treated and evaluated in the emergency department multiple times over the past several days without sustained relief.  She characterizes pain as 10 out of 10, aching and constant.  Patient also endorses chest pains.  Patient has had serial chest x-rays that showed no acute cardiopulmonary process.  Patient does not have primary care provider to prescribe home pain medications due to past cocaine use.  Patient endorses crack cocaine use, last used 3 days ago.  She denies IV drug use.  UDS on 10/17/2018 was positive for cocaine.  She currently denies headache, dizziness, shortness of breath, paresthesias, weight loss, fever, or chills.  Sickle cell day infusion center course: WBCs 27.6, patient currently afebrile.  Chest x-ray shows no acute cardiopulmonary process.  Patient maintaining oxygen saturation above 90% on RA. Hemoglobin 8.3, which is consistent with baseline of 7.0-8.0. LDH elevated at 546  No IV opiates initiated due to positive cocaine.  Pain persists despite treatment with oxycodone, IV fluids, IV Toradol, Tylenol, and subcutaneous Dilaudid.  Patient admitted for further pain management.    Review of systems:  Review of Systems  Constitutional: Negative for chills and fever.  HENT: Negative.   Eyes: Negative.   Respiratory: Negative for sputum production and shortness of breath.   Cardiovascular: Positive for chest pain.   Gastrointestinal: Positive for nausea.  Genitourinary: Negative.   Musculoskeletal: Positive for back pain, falls and joint pain.  Skin: Negative.   Neurological: Negative.  Negative for dizziness and headaches.  Endo/Heme/Allergies: Negative.   Psychiatric/Behavioral: The patient is nervous/anxious.     A full 10 point Review of Systems was done, except as stated above, all other Review of Systems were negative.  With Past History of the following :   Past Medical History:  Diagnosis Date  . Acute kidney injury ([place]) 05/15/2016  . Amenorrhea   . Anemia    SICKLE CELL  . Asthma   . Drug-induced pruritus 02/26/2015  . GERD (gastroesophageal reflux disease)   . Headache   . Hyperbilirubinemia 02/26/2015  . Sickle cell disease ([place])       Past Surgical History:  Procedure Laterality Date  . 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 TONSILLECTOMY     Age 40     Social History:   Social History   Tobacco Use  . Smoking status: Current Every Day Smoker    Packs/day: 0.50    Years: 10.00    Pack years: 5.00    Types: Cigarettes  . Smokeless tobacco: Never Used  Substance Use Topics  . Alcohol use: No    Alcohol/week: 6.0 standard drinks    Types: 6 Shots of liquor per week    Comment: Denies 06/12/2014     Lives - At home   Family History :   Family History  Problem Relation Age of Onset  . Hypertension Paternal Grandfather   . Sickle cell  trait Father   . Cancer Mother        Died in 02/18/09     Home Medications:   Prior to Admission medications   Not on File     Allergies:   No Known Allergies   Physical Exam:   Vitals:   Vitals:   10/18/18 1610 10/18/18 1733  BP: (!) 122/98 (!) 130/92  Pulse: 73 71  Resp: 16 (!) 22  Temp: 98.5 F (36.9 C) 98.7 F (37.1 C)  SpO2: 97% 96%    Physical Exam: Physical Exam Constitutional:      General: She is in acute distress.     Appearance: She is ill-appearing.  HENT:     Head: Normocephalic.      Right Ear: Tympanic membrane normal.     Nose: Nose normal.     Mouth/Throat:     Mouth: Mucous membranes are dry.     Pharynx: Oropharynx is clear.  Neck:     Musculoskeletal: Normal range of motion.  Cardiovascular:     Rate and Rhythm: Normal rate and regular rhythm.     Pulses: Normal pulses.  Pulmonary:     Effort: Pulmonary effort is normal.     Breath sounds: Normal breath sounds.  Musculoskeletal: Normal range of motion.  Skin:    General: Skin is warm.  Neurological:     General: No focal deficit present.     Mental Status: She is alert and oriented to person, place, and time.  Psychiatric:        Attention and Perception: She is inattentive.        Mood and Affect: Mood is anxious. Affect is angry, tearful and inappropriate.        Behavior: Behavior is agitated.        Judgment: Judgment is inappropriate.      Data Review:   CBC Recent Labs  Lab 10/17/18 1203 10/18/18 0105 10/18/18 1051  WBC 19.4* 27.6* PENDING  HGB 9.5* 8.3* 7.9*  HCT 27.4* 23.6* 22.9*  PLT 558* 501* 497*  MCV 104.6* 99.6 104.6*  MCH 36.3* 35.0* 36.1*  MCHC 34.7 35.2 34.5  RDW 18.8* 18.4* 19.4*  LYMPHSABS  --  6.4*  --   MONOABS  --  1.4*  --   EOSABS  --  0.0  --   BASOSABS  --  0.0  --    ------------------------------------------------------------------------------------------------------------------  Chemistries  Recent Labs  Lab 10/17/18 1203 10/17/18 1900  NA 141 139  K 4.3 4.8  CL 108 107  CO2 21* 21*  GLUCOSE 105* 129*  BUN 12 9  CREATININE 0.87 0.80  CALCIUM 8.9 9.7  AST 26 26  ALT 18 16  ALKPHOS 68 70  BILITOT 4.3* 4.4*   ------------------------------------------------------------------------------------------------------------------ estimated creatinine clearance is 94.4 mL/min (by C-G formula based on SCr of 0.8 mg/dL). ------------------------------------------------------------------------------------------------------------------ No results for  input(s): TSH, T4TOTAL, T3FREE, THYROIDAB in the last 72 hours.  Invalid input(s): FREET3  Coagulation profile No results for input(s): INR, PROTIME in the last 168 hours. ------------------------------------------------------------------------------------------------------------------- No results for input(s): DDIMER in the last 72 hours. -------------------------------------------------------------------------------------------------------------------  Cardiac Enzymes No results for input(s): CKMB, TROPONINI, MYOGLOBIN in the last 168 hours.  Invalid input(s): CK ------------------------------------------------------------------------------------------------------------------ No results found for: BNP  ---------------------------------------------------------------------------------------------------------------  Urinalysis    Component Value Date/Time   COLORURINE YELLOW 10/18/2018 0242   APPEARANCEUR CLOUDY (A) 10/18/2018 0242   LABSPEC 1.015 10/18/2018 0242   PHURINE 6.0 10/18/2018 0242   GLUCOSEU NEGATIVE 10/18/2018 0242  HGBUR TRACE (A) 10/18/2018 0242   BILIRUBINUR NEGATIVE 10/18/2018 0242   KETONESUR NEGATIVE 10/18/2018 0242   PROTEINUR NEGATIVE 10/18/2018 0242   UROBILINOGEN 1.0 08/11/2015 1948   NITRITE NEGATIVE 10/18/2018 0242   LEUKOCYTESUR TRACE (A) 10/18/2018 0242    ----------------------------------------------------------------------------------------------------------------   Imaging Results:    Dg Chest 2 View  Result Date: 10/17/2018 CLINICAL DATA:  Right-sided chest pain.  Sickle cell pain crisis. EXAM: CHEST - 2 VIEW COMPARISON:  Chest x-ray from same day at 11:13. FINDINGS: The patient is rotated to the right. The heart size and mediastinal contours are within normal limits. Normal pulmonary vascularity. No focal consolidation, pleural effusion, or pneumothorax. No acute osseous abnormality. IMPRESSION: No active cardiopulmonary disease.  Electronically Signed   By: Titus Dubin M.D.   On: 10/17/2018 21:53   Dg Chest 2 View  Result Date: 10/17/2018 CLINICAL DATA:  Chest pain. EXAM: CHEST - 2 VIEW COMPARISON:  Radiographs of May 14, 2017. FINDINGS: The heart size and mediastinal contours are within normal limits. Both lungs are clear. No pneumothorax or pleural effusion is noted. The visualized skeletal structures are unremarkable. IMPRESSION: No active cardiopulmonary disease. Electronically Signed   By: Marijo Conception, M.D.   On: 10/17/2018 11:19     Assessment & Plan:  Active Problems:   Sickle cell pain crisis (HCC)   Sickle cell anemia (HCC)  Sickle cell anemia with pain crisis: Admit, continue IVF D5 0.45% saline at 75 mL/h IV Toradol 15 mg every 6 hours Tylenol 1000 mg every 6 hours as needed for mild to moderate pain Oxycodone 10 mg every 4 hours while awake Dilaudid 1 mg SQ every 6 hours as needed for severe breakthrough pain Incentive spirometry Maintain oxygen saturation above 90% Monitor vital signs closely Reevaluate pain in the context of functioning  Leukocytosis: WBCs 27.6, patient currently afebrile.  Suspected to be reactive.  Continue to monitor closely.  CBC in a.m. follow urine culture  History of polysubstance abuse: UDS positive for cocaine on 10/17/2018.  Refrain from initiating IV pain medications. Discussed inpatient drug rehabilitation at length, patient declines.  Generalized anxiety: Patient has increased anxiety, she denies suicidal or homicidal intent. Patient may warrant psychiatric consult Hydroxyzine 10 mg every 4 hours as needed for anxiety  Homelessness: Patient refuses to discuss homelessness. Social work consult    DVT Prophylaxis: Subcut Lovenox   AM Labs Ordered, also please review Full Orders  Family Communication: Admission, patient's condition and plan of care including tests being ordered have been discussed with the patient who indicate understanding and  agree with the plan and Code Status.  Code Status: Full Code  Consults called: None    Admission status: Inpatient    Time spent in minutes : 50 minutes  [place], MSN, FNP-C [facility] [address] [place], [place] [address] [phone]  10/18/2018 at 6:39 PM

## 2018-10-18 NOTE — ED Provider Notes (Signed)
Patient with sickle cell pain.  Patient signed out to me at shift change.  Patient medicated with multiple doses of dilaudid.  Is sobering up now.    Leukocytosis to 27.6.  Unclear etiology, could be reactive.  Denies dysuria, abdominal pain or cough.  Patient is alert and oriented.  Still complaining of severe pain.  Patient stating that she wants to go.  I did offer admission.  Patient repeatedly states, "I want to go, I want to go, I want to go."    She was offered admission by 3 different people including myself.  Ultimately chose to go.   Montine Circle, PA-C 10/18/18 1610    Fredia Sorrow, MD 10/18/18 [phone]

## 2018-10-18 NOTE — Discharge Instructions (Addendum)
Go to the sickle cell clinic at 8 as needed for sickle cell pain. Make sure you are drinking water and staying well hydrated.  Stop using cocaine.  Return to the ER with any new, worsening, or concerning symptoms.

## 2018-10-18 NOTE — ED Triage Notes (Signed)
Patient arrives ambulatory by [facility]-just left [facility] ED after being seen here initially at [facility]-patient received multiple doses of Dilaudid at [facility] and then declined admission by 3 different staff.

## 2018-10-18 NOTE — ED Notes (Signed)
Bed: WA09 Expected date:  Expected time:  Means of arrival:  Comments: EMS sickle cell

## 2018-10-18 NOTE — H&P (Signed)
[facility] History and Physical   Date: 10/18/2018  Patient name: [name] Medical record number: [id number] Date of birth: 1996/04/06 Age: 22 y.o. Gender: female PCP: Dorena Dew, FNP  Attending physician: Tresa Garter, MD  Chief Complaint: Generalized pain  History of Present Illness: [name], a 22 year old female with a medical history significant for sickle cell anemia, chronic pain syndrome, asthma, homelessness, and cocaine abuse presents complaining of generalized pain. Patient states that pain is primarily to chest, back, and lower extremities. She says that pain crisis started several days ago. Patient has been seen in the emergency department multiple times over the past several days. She characterizes pain as 10/10, aching, and constant. She says that she does not have pain medications at home. She does not have a PCP to prescribe medications.. She has been taking Aleve and Tylenol without relief. Patient says that she was discharged in pain. Patient denies IV drug use but endorses using crack cocaine 3 days ago. UDS on 10/17/2018 was positive for cocaine. She denies headache shortness of breath, paresthesias, weight loss, fever, or chills.   Meds: No medications prior to admission.    Allergies: Patient has no known allergies. Past Medical History:  Diagnosis Date  . Acute kidney injury ([place]) 05/15/2016  . Amenorrhea   . Anemia    SICKLE CELL  . Asthma   . Drug-induced pruritus 02/26/2015  . GERD (gastroesophageal reflux disease)   . Headache   . Hyperbilirubinemia 02/26/2015  . Sickle cell disease ([place])    Past Surgical History:  Procedure Laterality Date  . 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 TONSILLECTOMY     Age 57   Family History  Problem Relation Age of Onset  . Hypertension Paternal Grandfather   . Sickle cell trait Father   . Cancer Mother        Died in February 17, 2009   Social History    Socioeconomic History  . Marital status: Single    Spouse name: Not on file  . Number of children: Not on file  . Years of education: Not on file  . Highest education level: Not on file  Occupational History  . Occupation: unemployed  Social Needs  . Financial resource strain: Not on file  . Food insecurity:    Worry: Not on file    Inability: Not on file  . Transportation needs:    Medical: Not on file    Non-medical: Not on file  Tobacco Use  . Smoking status: Current Every Day Smoker    Packs/day: 0.50    Years: 10.00    Pack years: 5.00    Types: Cigarettes  . Smokeless tobacco: Never Used  Substance and Sexual Activity  . Alcohol use: No    Alcohol/week: 6.0 standard drinks    Types: 6 Shots of liquor per week    Comment: Denies 06/12/2014  . Drug use: Yes    Types: Marijuana, Cocaine  . Sexual activity: Yes    Birth control/protection: Condom    Comment: Stable relationship, trying X2 years for pregnancy; [name] is boyfriend (23)  Lifestyle  . Physical activity:    Days per week: Not on file    Minutes per session: Not on file  . Stress: Not on file  Relationships  . Social connections:    Talks on phone: Not on file    Gets together: Not on file    Attends religious service:  Not on file    Active member of club or organization: Not on file    Attends meetings of clubs or organizations: Not on file    Relationship status: Not on file  . Intimate partner violence:    Fear of current or ex partner: Not on file    Emotionally abused: Not on file    Physically abused: Not on file    Forced sexual activity: Not on file  Other Topics Concern  . Not on file  Social History Narrative   Pt is doing much better in school [employer] 11th grade)   Really wants to go to college now after talking to her cousin.  Pt has aspiration of being a lawyer/physician/nurse/owning business after school.   Pt is now living with her father again.           Mom died at age 66 and  patient has a lot of fears abotu dying young and really wants to be a mom but has other goals. She was 13 at time of moms death and does not have much contact with her 23 year old biological sister which saddens her.                  Review of Systems  Constitutional: Positive for malaise/fatigue. Negative for chills and fever.  HENT: Negative.   Eyes: Negative.   Respiratory: Negative for cough, sputum production and shortness of breath.   Cardiovascular: Positive for chest pain. Negative for palpitations and leg swelling.  Gastrointestinal: Negative for nausea and vomiting.  Genitourinary: Negative.   Musculoskeletal: Positive for back pain and joint pain.  Skin: Negative for itching and rash.  Neurological: Negative.   Psychiatric/Behavioral: Negative for suicidal ideas. The patient is nervous/anxious.    Physical Exam: There were no vitals taken for this visit. Physical Exam Vitals reviewed: Patient writhing in pain.  Constitutional:      General: She is in acute distress.     Appearance: She is ill-appearing.  HENT:     Head: Normocephalic.     Right Ear: Tympanic membrane normal.     Left Ear: Tympanic membrane normal.     Nose: Nose normal.  Eyes:     Pupils: Pupils are equal, round, and reactive to light.  Cardiovascular:     Rate and Rhythm: Normal rate and regular rhythm.     Pulses: Normal pulses.  Pulmonary:     Effort: Pulmonary effort is normal.     Breath sounds: Normal breath sounds.  Abdominal:     General: Abdomen is flat.     Palpations: Abdomen is soft.  Musculoskeletal: Normal range of motion.  Skin:    General: Skin is warm and dry.  Neurological:     General: No focal deficit present.     Mental Status: She is alert.  Psychiatric:        Attention and Perception: She does not perceive visual hallucinations.        Mood and Affect: Mood is anxious. Affect is angry and tearful.        Behavior: Behavior is agitated.        Thought Content:  Thought content does not include homicidal or suicidal ideation.        Judgment: Judgment is impulsive.      Lab results: Results for orders placed or performed during the hospital encounter of 10/17/18 (from the past 24 hour(s))  Comprehensive metabolic panel     Status: Abnormal  Collection Time: 10/17/18  7:00 PM  Result Value Ref Range   Sodium 139 135 - 145 mmol/L   Potassium 4.8 3.5 - 5.1 mmol/L   Chloride 107 98 - 111 mmol/L   CO2 21 (L) 22 - 32 mmol/L   Glucose, Bld 129 (H) 70 - 99 mg/dL   BUN 9 6 - 20 mg/dL   Creatinine, Ser 0.80 0.44 - 1.00 mg/dL   Calcium 9.7 8.9 - 10.3 mg/dL   Total Protein 7.4 6.5 - 8.1 g/dL   Albumin 4.4 3.5 - 5.0 g/dL   AST 26 15 - 41 U/L   ALT 16 0 - 44 U/L   Alkaline Phosphatase 70 38 - 126 U/L   Total Bilirubin 4.4 (H) 0.3 - 1.2 mg/dL   GFR calc non Af Amer >60 >60 mL/min   GFR calc Af Amer >60 >60 mL/min   Anion gap 11 5 - 15  I-Stat Troponin, ED (not at [facility])     Status: None   Collection Time: 10/17/18  7:05 PM  Result Value Ref Range   Troponin i, poc 0.01 0.00 - 0.08 ng/mL   Comment 3          I-Stat beta hCG blood, ED     Status: None   Collection Time: 10/17/18  7:06 PM  Result Value Ref Range   I-stat hCG, quantitative <5.0 <5 mIU/mL   Comment 3          Reticulocytes     Status: Abnormal   Collection Time: 10/17/18  8:59 PM  Result Value Ref Range   Retic Ct Pct 14.7 (H) 0.4 - 3.1 %   RBC. 2.57 (L) 3.87 - 5.11 MIL/uL   Retic Count, Absolute 377.0 (H) 19.0 - 186.0 K/uL   Immature Retic Fract 32.4 (H) 2.3 - 15.9 %  CBC with Differential/Platelet     Status: Abnormal   Collection Time: 10/18/18  1:05 AM  Result Value Ref Range   WBC 27.6 (H) 4.0 - 10.5 K/uL   RBC 2.37 (L) 3.87 - 5.11 MIL/uL   Hemoglobin 8.3 (L) 12.0 - 15.0 g/dL   HCT 23.6 (L) 36.0 - 46.0 %   MCV 99.6 80.0 - 100.0 fL   MCH 35.0 (H) 26.0 - 34.0 pg   MCHC 35.2 30.0 - 36.0 g/dL   RDW 18.4 (H) 11.5 - 15.5 %   Platelets 501 (H) 150 - 400 K/uL   nRBC 5.3 (H)  0.0 - 0.2 %   Neutrophils Relative % 60 %   Neutro Abs 16.6 (H) 1.7 - 7.7 K/uL   Band Neutrophils 0 %   Lymphocytes Relative 23 %   Lymphs Abs 6.4 (H) 0.7 - 4.0 K/uL   Monocytes Relative 5 %   Monocytes Absolute 1.4 (H) 0.1 - 1.0 K/uL   Eosinophils Relative 0 %   Eosinophils Absolute 0.0 0.0 - 0.5 K/uL   Basophils Relative 0 %   Basophils Absolute 0.0 0.0 - 0.1 K/uL   WBC Morphology VACUOLATED NEUTROPHILS    RBC Morphology Sickle cells present   Urinalysis, Routine w reflex microscopic     Status: Abnormal   Collection Time: 10/18/18  2:42 AM  Result Value Ref Range   Color, Urine YELLOW YELLOW   APPearance CLOUDY (A) CLEAR   Specific Gravity, Urine 1.015 1.005 - 1.030   pH 6.0 5.0 - 8.0   Glucose, UA NEGATIVE NEGATIVE mg/dL   Hgb urine dipstick TRACE (A) NEGATIVE   Bilirubin Urine NEGATIVE NEGATIVE  Ketones, ur NEGATIVE NEGATIVE mg/dL   Protein, ur NEGATIVE NEGATIVE mg/dL   Nitrite NEGATIVE NEGATIVE   Leukocytes, UA TRACE (A) NEGATIVE  Urinalysis, Microscopic (reflex)     Status: Abnormal   Collection Time: 10/18/18  2:42 AM  Result Value Ref Range   RBC / HPF 0-5 0 - 5 RBC/hpf   WBC, UA 0-5 0 - 5 WBC/hpf   Bacteria, UA RARE (A) NONE SEEN   Squamous Epithelial / LPF 0-5 0 - 5    Imaging results:  Dg Chest 2 View  Result Date: 10/17/2018 CLINICAL DATA:  Right-sided chest pain.  Sickle cell pain crisis. EXAM: CHEST - 2 VIEW COMPARISON:  Chest x-ray from same day at 11:13. FINDINGS: The patient is rotated to the right. The heart size and mediastinal contours are within normal limits. Normal pulmonary vascularity. No focal consolidation, pleural effusion, or pneumothorax. No acute osseous abnormality. IMPRESSION: No active cardiopulmonary disease. Electronically Signed   By: Titus Dubin M.D.   On: 10/17/2018 21:53   Dg Chest 2 View  Result Date: 10/17/2018 CLINICAL DATA:  Chest pain. EXAM: CHEST - 2 VIEW COMPARISON:  Radiographs of May 14, 2017. FINDINGS: The heart  size and mediastinal contours are within normal limits. Both lungs are clear. No pneumothorax or pleural effusion is noted. The visualized skeletal structures are unremarkable. IMPRESSION: No active cardiopulmonary disease. Electronically Signed   By: Marijo Conception, M.D.   On: 10/17/2018 11:19     Assessment & Plan:  Patient will be admitted to the day infusion center for extended observation  Start IV D5.45 for cellular rehydration at 125/hr  Start Toradol 15 mg times 1  Tylenol 1000 mg times 1  Oxycodone 10 mg times 1  Dilaudid 0.5 mg SQ times 1  Patient will be re-evaluated for pain intensity in the context of function and relationship to baseline as care progresses.  If no significant pain relief, will transfer patient to inpatient services for a higher level of care.   Will review CMP, LDH and CBC w/differential   Donia Pounds  APRN, MSN, FNP-C [facility] [address] [place], [place] [address] [phone]   10/18/2018, 10:10 AM

## 2018-10-18 NOTE — ED Notes (Signed)
Informed patient she is up for discharge and we need to remove her IV. She allowed [place] to remove her IV but refuses to leave.   Security made aware.

## 2018-10-18 NOTE — ED Provider Notes (Signed)
[facility] DEPT Provider Note   CSN: [id number] Arrival date & time: 10/18/18  0757     History   Chief Complaint Chief Complaint  Patient presents with  . Sickle Cell Pain Crisis    HPI [name] is a 22 y.o. female with a history of sickle cell anemia who presents to the emergency department for her fourth ED visit in the last 24 hours.  She states she is having sickle cell pain.  She is complaining of chest pain and a headache.  She denies dyspnea, fever, chills, visual changes, numbness, or weakness.  She denies GU symptoms, neck pain or stiffness, body aches, or URI symptoms.  She refuses to answer questions regarding triggers for sickle cell pain crisis.  She refuses to answer questions regarding symptoms typical for her sickle cell pain crises.  She refuses to answer questions regarding aggravating or alleviating factors.  She also refuses to answer questions about her home medications, primary care provider, last dose of pain medicine.   The remainder of the patient's history of present illness is obtained from reviewing her medical record.  She was first seen yesterday morning around 10 AM when she came to the ED by EMS with anterior chest discomfort.  She was unable to stay awake for the history at that time.   Level 5 caveat secondary to patient's limited participation due to not willing to answer questions or provide additional history.   The history is provided by the patient and medical records. No language interpreter was used.    Past Medical History:  Diagnosis Date  . Acute kidney injury ([place]) 05/15/2016  . Amenorrhea   . Anemia    SICKLE CELL  . Asthma   . Drug-induced pruritus 02/26/2015  . GERD (gastroesophageal reflux disease)   . Headache   . Hyperbilirubinemia 02/26/2015  . Sickle cell disease [facility])     Patient Active Problem List   Diagnosis Date Noted  . Elevated liver enzymes 05/17/2018  . Sickle cell  anemia ([place]) 05/16/2018  . Sickle cell crisis ([place]) 05/16/2018  . Sickle cell anemia with crisis ([place]) 12/16/2017  . Substance abuse ([place]) 12/16/2017  . Homelessness 02/12/2017  . Protein-calorie malnutrition, severe 10/21/2016  . Anemia due to other cause   . Hb-SS disease without crisis ([place])   . 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 Tobacco abuse   . Leukocytosis   . Sickle cell pain crisis ([place]) 02/15/2013  . Abdominal pain 07/25/2012  . Asthma, mild intermittent 12/14/2010    Past Surgical History:  Procedure Laterality Date  . 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 TONSILLECTOMY     Age 24     OB History   No obstetric history on file.      Home Medications    Prior to Admission medications   Not on File    Family History Family History  Problem Relation Age of Onset  . Hypertension Paternal Grandfather   . Sickle cell trait Father   . Cancer Mother        Died in January 31, 2009    Social History Social History   Tobacco Use  . Smoking status: Current Every Day Smoker    Packs/day: 0.50    Years: 10.00    Pack years: 5.00    Types: Cigarettes  . Smokeless tobacco: Never Used  Substance Use Topics  . Alcohol  use: No    Alcohol/week: 6.0 standard drinks    Types: 6 Shots of liquor per week    Comment: Denies 06/12/2014  . Drug use: Yes    Types: Marijuana, Cocaine     Allergies   Patient has no known allergies.   Review of Systems Review of Systems  Unable to perform ROS: Other (Limited participation from the patient; refusing to answer questions)  Constitutional: Negative for activity change, chills and fever.  Respiratory: Negative for shortness of breath.   Cardiovascular: Positive for chest pain.  Gastrointestinal: Negative for abdominal pain and vomiting.  Genitourinary: Negative for dysuria, hematuria, vaginal bleeding, vaginal discharge and vaginal pain.  Musculoskeletal:  Negative for back pain, myalgias, neck pain and neck stiffness.  Skin: Negative for rash.  Neurological: Positive for headaches.     Physical Exam Updated Vital Signs BP (!) 131/91   Pulse 91   Temp 98.3 F (36.8 C)   Resp 16   LMP  (LMP Unknown) Comment: pt too drowsy to answer.   SpO2 99%   Physical Exam Vitals signs and nursing note reviewed.  Constitutional:      General: She is not in acute distress.    Appearance: She is not ill-appearing or diaphoretic.     Comments: She is in no acute distress.  She does not appear ill or toxic.  She is not diaphoretic.  Intermittently sleeping and snoring throughout the interview and exam.  Easily arousable to voice.  HENT:     Head: Normocephalic.  Eyes:     General:        Right eye: No discharge.        Left eye: No discharge.     Conjunctiva/sclera: Conjunctivae normal.     Comments: Pupils are equal round and reactive.  No discharge bilaterally. Eyes are not injected.   Neck:     Musculoskeletal: Neck supple.     Comments: Full active and passive range of motion of the neck.  Neck is supple. Cardiovascular:     Rate and Rhythm: Normal rate and regular rhythm.     Heart sounds: No murmur. No friction rub. No gallop.   Pulmonary:     Effort: Pulmonary effort is normal. No respiratory distress.     Comments: Lungs are clear to auscultation bilaterally without rhonchi, rales, or wheezes.  No nasal flaring, accessory muscle use, or retractions.  Symmetric chest expansion.  No tenderness is appreciated upon palpation of the chest wall. Abdominal:     General: Abdomen is flat. There is no distension.     Palpations: Abdomen is soft. There is no mass.     Tenderness: There is no abdominal tenderness. There is no right CVA tenderness, left CVA tenderness, guarding or rebound.     Hernia: No hernia is present.     Comments: Abdomen is soft, nontender, nondistended.  Musculoskeletal:     Right lower leg: No edema.     Left lower  leg: No edema.  Skin:    General: Skin is warm.     Coloration: Skin is not pale.     Findings: No erythema or rash.  Neurological:     Mental Status: She is alert.     Comments: Follows commands when the patient chooses to participate in the exam.  She will intermittently voluntarily close her eyes; Portions of the facial nerve appear intact.  GCS 14.  Moves all 4 extremities.  When response is voluntary, speaks in complete, fluent  sentences without increased work of breathing. Ambulatory in the hallway without difficulty or ataxia.   Psychiatric:        Behavior: Behavior is uncooperative.    ED Treatments / Results  Labs (all labs ordered are listed, but only abnormal results are displayed) Labs Reviewed - No data to display  EKG None  Radiology Dg Chest 2 View  Result Date: 10/17/2018 CLINICAL DATA:  Right-sided chest pain.  Sickle cell pain crisis. EXAM: CHEST - 2 VIEW COMPARISON:  Chest x-ray from same day at 11:13. FINDINGS: The patient is rotated to the right. The heart size and mediastinal contours are within normal limits. Normal pulmonary vascularity. No focal consolidation, pleural effusion, or pneumothorax. No acute osseous abnormality. IMPRESSION: No active cardiopulmonary disease. Electronically Signed   By: Titus Dubin M.D.   On: 10/17/2018 21:53   Dg Chest 2 View  Result Date: 10/17/2018 CLINICAL DATA:  Chest pain. EXAM: CHEST - 2 VIEW COMPARISON:  Radiographs of May 14, 2017. FINDINGS: The heart size and mediastinal contours are within normal limits. Both lungs are clear. No pneumothorax or pleural effusion is noted. The visualized skeletal structures are unremarkable. IMPRESSION: No active cardiopulmonary disease. Electronically Signed   By: Marijo Conception, M.D.   On: 10/17/2018 11:19    Procedures Procedures (including critical care time)  Medications Ordered in ED Medications - No data to display   Initial Impression / Assessment and Plan / ED  Course  I have reviewed the triage vital signs and the nursing notes.  Pertinent labs & imaging results that were available during my care of the patient were reviewed by me and considered in my medical decision making (see chart for details).     22 year old female with history of sickle cell anemia presenting to the ER for the fourth time in the last 24 hours with complaints of sickle cell pain crisis.  Of note, she was discharged approximately 1.5 hours ago.  She is endorsing anterior chest wall pain and a headache.  I do not see any obvious neurological deficits on exam, but limited exam secondary to patient cooperation.  She is having no other signs or symptoms of acute chest.  She has had 2 chest x-rays within the last 24 hours, which have both been normal. Hgb 8.3, improved from baseline. Baseline appears to be 6.5-75. EKG from previous ER visit with NSR and sinus arrhythmia.  She has no tachycardia.  She is normotensive.  She is afebrile and is not hypoxic.  I have a very low suspicion for acute chest at this time.  No meningeal signs on exam.  Low suspicion for CVA. At this time, I do not feel comfortable administering the patient narcotic medication based on how intermittently drowsy she is.  GCS is 14.  Based on her work-up in the last 24 hours, I do not feel that further urgent or emergent work-up is indicated.  I have advised the patient to follow-up with primary care.  She can also follow-up with the sickle cell day clinic, which is now open at this time.  She has been given strict return precautions to the emergency department for new or worsening symptoms.  Final Clinical Impressions(s) / ED Diagnoses   Final diagnoses:  Sickle cell anemia with pain [facility])    ED Discharge Orders    None       Joanne Gavel, PA-C 10/18/18 [place], Ankit, MD 10/18/18 [phone]

## 2018-10-18 NOTE — ED Notes (Signed)
Patient sitting up. When asked about her pain she mumbles and falls back asleep. VSS.

## 2018-10-19 LAB — CBC
HEMATOCRIT: 23.9 % — AB (ref 36.0–46.0)
Hemoglobin: 8.5 g/dL — ABNORMAL LOW (ref 12.0–15.0)
MCH: 35.9 pg — ABNORMAL HIGH (ref 26.0–34.0)
MCHC: 35.6 g/dL (ref 30.0–36.0)
MCV: 100.8 fL — ABNORMAL HIGH (ref 80.0–100.0)
Platelets: 449 10*3/uL — ABNORMAL HIGH (ref 150–400)
RBC: 2.37 MIL/uL — ABNORMAL LOW (ref 3.87–5.11)
RDW: 18.8 % — ABNORMAL HIGH (ref 11.5–15.5)
WBC: 18 10*3/uL — ABNORMAL HIGH (ref 4.0–10.5)
nRBC: 11 % — ABNORMAL HIGH (ref 0.0–0.2)

## 2018-10-19 LAB — BASIC METABOLIC PANEL
Anion gap: 9 (ref 5–15)
BUN: 8 mg/dL (ref 6–20)
CO2: 24 mmol/L (ref 22–32)
Calcium: 8.8 mg/dL — ABNORMAL LOW (ref 8.9–10.3)
Chloride: 102 mmol/L (ref 98–111)
Creatinine, Ser: 0.63 mg/dL (ref 0.44–1.00)
GFR calc Af Amer: >60 mL/min (ref >60)
GFR calc non Af Amer: >60 mL/min (ref >60)
Glucose, Bld: 90 mg/dL (ref 70–99)
POTASSIUM: 4.2 mmol/L (ref 3.5–5.1)
Sodium: 135 mmol/L (ref 135–145)

## 2018-10-19 NOTE — Plan of Care (Signed)
Patient continues to c/o pain in bilateral arms but states it has improved somewhat.  Patient tolerating regular diet.  Grandfather and aunt at bedside earlier in the shift, very concerned that patient be given resources by social work since she is homeless.  They do not want her to end up "back on the street".

## 2018-10-19 NOTE — Progress Notes (Signed)
Subjective: Patient is a 22 year old with history of sickle cell disease, asthma, polysubstance abuse and homelessness who was admitted with sickle cell painful crisis.  Patient is complaining 7 out of 10 pain.  She had leukocytosis.  Also significant nausea.  She still uses cocaine and her urine drug screen was positive for cocaine.  Objective: Vital signs in last 24 hours: Temp:  [98.1 F (36.7 C)-99.3 F (37.4 C)] 99.3 F (37.4 C) (12/21 1342) Pulse Rate:  [71-91] 90 (12/21 1342) Resp:  [16-22] 18 (12/21 1342) BP: (122-152)/(82-98) 129/82 (12/21 1342) SpO2:  [95 %-99 %] 95 % (12/21 1342) Weight:  [61.2 kg] 61.2 kg (12/20 1842) Weight change:  Last BM Date: (Pt. refused to answer)  Intake/Output from previous day: 12/20 0701 - 12/21 0700 In: 1383.7 [P.O.:480; I.V.:903.7] Out: -  Intake/Output this shift: No intake/output data recorded.  General appearance: alert and cooperative Neck: no adenopathy, no carotid bruit, no JVD, supple, symmetrical, trachea midline and thyroid not enlarged, symmetric, no tenderness/mass/nodules Back: symmetric, no curvature. ROM normal. No CVA tenderness. Resp: clear to auscultation bilaterally Cardio: regular rate and rhythm, S1, S2 normal, no murmur, click, rub or gallop GI: soft, non-tender; bowel sounds normal; no masses,  no organomegaly Extremities: extremities normal, atraumatic, no cyanosis or edema Pulses: 2+ and symmetric Neurologic: Grossly normal  Lab Results: Recent Labs    10/18/18 1051 10/19/18 0457  WBC 26.2* 18.0*  HGB 7.9* 8.5*  HCT 22.9* 23.9*  PLT 497* 449*   BMET Recent Labs    10/17/18 1900 10/18/18 1051 10/19/18 0457  NA 139  --  135  K 4.8  --  4.2  CL 107  --  102  CO2 21*  --  24  GLUCOSE 129*  --  90  BUN 9  --  8  CREATININE 0.80 0.80 0.63  CALCIUM 9.7  --  8.8*    Studies/Results: Dg Chest 2 View  Result Date: 10/17/2018 CLINICAL DATA:  Right-sided chest pain.  Sickle cell pain crisis. EXAM:  CHEST - 2 VIEW COMPARISON:  Chest x-ray from same day at 11:13. FINDINGS: The patient is rotated to the right. The heart size and mediastinal contours are within normal limits. Normal pulmonary vascularity. No focal consolidation, pleural effusion, or pneumothorax. No acute osseous abnormality. IMPRESSION: No active cardiopulmonary disease. Electronically Signed   By: Titus Dubin M.D.   On: 10/17/2018 21:53    Medications: I have reviewed the patient's current medications.  Assessment/Plan: A 22 year old admitted with sickle cell painful crisis.  #1 sickle cell anemia with crisis: Patient is still having significant pain.  She is mainly on oral oxycodone with subcutaneous Dilaudid every 4 hours due to patient's noncompliance and drug abuse.  We will continue this regimen until patient improves.  #2 leukocytosis: Most likely secondary to vaso-occlusive crisis.  Monitor white count.  #3 polysubstance abuse: Again this patient was positive for cocaine this admission.  Counseling was again provided.  #4 sickle cell anemia: Her H&H appears to be at baseline.  Continue to monitor.   #5 homelessness: Patient is refusing to discuss options for her homelessness.  We will still get social worker involvement.  LOS: 0 days   GARBA,LAWAL 10/19/2018, 2:35 PM

## 2018-10-20 NOTE — Plan of Care (Signed)
Patient continues to rate pain in arms at a 8 or 9.  Patient calm and cooperative, tolerating diet without issue.  Patients aunt plans to be at hospital early on 12/23 to try and talk to doctor and social work regarding patients social situation.

## 2018-10-20 NOTE — Progress Notes (Signed)
Subjective: Patient is still complaining of 7 out of 10 pain in her back and legs.  Denies any nausea or vomiting.  Denies any other complaint.  She is able to eat and drink without any problem.  Objective: Vital signs in last 24 hours: Temp:  [98.1 F (36.7 C)-99.7 F (37.6 C)] 98.1 F (36.7 C) (12/22 2117) Pulse Rate:  [78-92] 78 (12/22 2117) Resp:  [14-18] 14 (12/22 2117) BP: (119-145)/(76-94) 119/76 (12/22 2117) SpO2:  [94 %-100 %] 94 % (12/22 2117) Weight change:  Last BM Date: (patient does not know date of last BM )  Intake/Output from previous day: 12/21 0701 - 12/22 0700 In: 2568.5 [P.O.:480; I.V.:2088.5] Out: -  Intake/Output this shift: No intake/output data recorded.  General appearance: alert and cooperative Neck: no adenopathy, no carotid bruit, no JVD, supple, symmetrical, trachea midline and thyroid not enlarged, symmetric, no tenderness/mass/nodules Back: symmetric, no curvature. ROM normal. No CVA tenderness. Resp: clear to auscultation bilaterally Cardio: regular rate and rhythm, S1, S2 normal, no murmur, click, rub or gallop GI: soft, non-tender; bowel sounds normal; no masses,  no organomegaly Extremities: extremities normal, atraumatic, no cyanosis or edema Pulses: 2+ and symmetric Neurologic: Grossly normal  Lab Results: Recent Labs    10/18/18 1051 10/19/18 0457  WBC 26.2* 18.0*  HGB 7.9* 8.5*  HCT 22.9* 23.9*  PLT 497* 449*   BMET Recent Labs    10/18/18 1051 10/19/18 0457  NA  --  135  K  --  4.2  CL  --  102  CO2  --  24  GLUCOSE  --  90  BUN  --  8  CREATININE 0.80 0.63  CALCIUM  --  8.8*    Studies/Results: No results found.  Medications: I have reviewed the patient's current medications.  Assessment/Plan: A 22 year old admitted with sickle cell painful crisis.  #1 sickle cell anemia with crisis: Patient is still having significant pain.  We will continue oral oxycodone with subcutaneous Dilaudid every 4 hours due to  patient's noncompliance and drug abuse.  We will continue this regimen until patient improves.  #2 leukocytosis: White count has improved down from 26,000-18,000.  Continue monitoring most likely secondary to vaso-occlusive crisis.  Monitor white count.  #3 polysubstance abuse: Again this patient was positive for cocaine this admission.  Counseling was again provided.  #4 sickle cell anemia: Her H&H appears to be at baseline.  Continue to monitor.   #5 homelessness: Patient is refusing to discuss options for her homelessness.  We will still get social worker involvement.  LOS: 0 days   GARBA,LAWAL 10/20/2018, 10:18 PM

## 2018-10-21 DIAGNOSIS — D638 Anemia in other chronic diseases classified elsewhere: Secondary | ICD-10-CM

## 2018-10-21 LAB — CBC
HCT: 19.2 % — ABNORMAL LOW (ref 36.0–46.0)
Hemoglobin: 6.6 g/dL — CL (ref 12.0–15.0)
MCH: 35.7 pg — ABNORMAL HIGH (ref 26.0–34.0)
MCHC: 34.4 g/dL (ref 30.0–36.0)
MCV: 103.8 fL — ABNORMAL HIGH (ref 80.0–100.0)
NRBC: 2.8 % — AB (ref 0.0–0.2)
Platelets: 390 10*3/uL (ref 150–400)
RBC: 1.85 MIL/uL — ABNORMAL LOW (ref 3.87–5.11)
RDW: 17.4 % — ABNORMAL HIGH (ref 11.5–15.5)
WBC: 14 10*3/uL — ABNORMAL HIGH (ref 4.0–10.5)

## 2018-10-21 LAB — BASIC METABOLIC PANEL
ANION GAP: 7 (ref 5–15)
BUN: 14 mg/dL (ref 6–20)
CO2: 24 mmol/L (ref 22–32)
Calcium: 8.4 mg/dL — ABNORMAL LOW (ref 8.9–10.3)
Chloride: 106 mmol/L (ref 98–111)
Creatinine, Ser: 0.66 mg/dL (ref 0.44–1.00)
GFR calc non Af Amer: >60 mL/min (ref >60)
Glucose, Bld: 119 mg/dL — ABNORMAL HIGH (ref 70–99)
Potassium: 4.4 mmol/L (ref 3.5–5.1)
SODIUM: 137 mmol/L (ref 135–145)

## 2018-10-21 MED ORDER — OXYCODONE HCL 5 MG PO TABS
15.0000 mg | ORAL_TABLET | ORAL | Status: DC
  Administered 2018-10-21 – 2018-10-24 (×14): 15 mg via ORAL
  Filled 2018-10-21 (×14): qty 3

## 2018-10-21 NOTE — Progress Notes (Signed)
CRITICAL VALUE ALERT  Critical Value:  Hgb 6.6  Date & Time Notied:  10/21/18 @ 0941  Provider Notified:Yes  Orders Received/Actions taken: Yes

## 2018-10-21 NOTE — Progress Notes (Addendum)
Subjective: Patient states that pain intensity has not been controlled throughout admission.  Current pain intensity is 9/10.  Patient writhing in pain on arrival.  She is requesting IV pain medications.  Urine drug screen on 10/17/2018 was positive for cocaine. Patient states that she is not interested in discussing cocaine use or homelessness at this time. She denies headache, dizziness, shortness of breath, paresthesias, weight loss, fevers or chills.  Today hemoglobin has decreased to 6.6, which is below baseline of 7.0-8.0.  Objective:  Vital signs in last 24 hours:  Vitals:   10/20/18 2117 10/21/18 0153 10/21/18 0604 10/21/18 1401  BP: 119/76 (!) 107/52 109/63 104/64  Pulse: 78 80 83 93  Resp: 14 14 14 18   Temp: 98.1 F (36.7 C) 98.7 F (37.1 C) 98.9 F (37.2 C) 99.3 F (37.4 C)  TempSrc: Oral Oral Oral Oral  SpO2: 94% 95% 96% 96%  Weight:      Height:        Intake/Output from previous day:   Intake/Output Summary (Last 24 hours) at 10/21/2018 1537 Last data filed at 10/21/2018 1403 Gross per 24 hour  Intake 1994.96 ml  Output -  Net 1994.96 ml    Physical Exam: General: Alert, awake, oriented x3, in no acute distress.  HEENT: [place]/AT PEERL, EOMI Neck: Trachea midline,  no masses, no thyromegal,y no JVD, no carotid bruit OROPHARYNX:  Moist, No exudate/ erythema/lesions.  Heart: Regular rate and rhythm, without murmurs, rubs, gallops, PMI non-displaced, no heaves or thrills on palpation.  Lungs: Clear to auscultation, no wheezing or rhonchi noted. No increased vocal fremitus resonant to percussion  Abdomen: Soft, nontender, nondistended, positive bowel sounds, no masses no hepatosplenomegaly noted..  Neuro: No focal neurological deficits noted cranial nerves II through XII grossly intact. DTRs 2+ bilaterally upper and lower extremities. Strength 5 out of 5 in bilateral upper and lower extremities. Musculoskeletal: No warm swelling or erythema around joints, no spinal  tenderness noted. Psychiatric: Patient alert and oriented x3, good insight and cognition, good recent to remote recall. Lymph node survey: No cervical axillary or inguinal lymphadenopathy noted.  Lab Results:  Basic Metabolic Panel:    Component Value Date/Time   NA 137 10/21/2018 0918   K 4.4 10/21/2018 0918   CL 106 10/21/2018 0918   CO2 24 10/21/2018 0918   BUN 14 10/21/2018 0918   CREATININE 0.66 10/21/2018 0918   GLUCOSE 119 (H) 10/21/2018 0918   CALCIUM 8.4 (L) 10/21/2018 0918   CBC:    Component Value Date/Time   WBC 14.0 (H) 10/21/2018 0918   HGB 6.6 (LL) 10/21/2018 0918   HCT 19.2 (L) 10/21/2018 0918   PLT 390 10/21/2018 0918   MCV 103.8 (H) 10/21/2018 0918   NEUTROABS 16.6 (H) 10/18/2018 0105   LYMPHSABS 6.4 (H) 10/18/2018 0105   MONOABS 1.4 (H) 10/18/2018 0105   EOSABS 0.0 10/18/2018 0105   BASOSABS 0.0 10/18/2018 0105    No results found for this or any previous visit (from the past 240 hour(s)).  Studies/Results: No results found.  Medications: Scheduled Meds: . enoxaparin (LOVENOX) injection  40 mg Subcutaneous Q24H  . ketorolac  15 mg Intravenous Q6H  . oxyCODONE  10 mg Oral Q4H while awake  . senna-docusate  1 tablet Oral BID   Continuous Infusions: . sodium chloride 75 mL/hr at 10/20/18 2323   PRN Meds:.acetaminophen, HYDROmorphone (DILAUDID) injection, hydrOXYzine, ondansetron **OR** ondansetron (ZOFRAN) IV, polyethylene glycol Assessment/Plan: Active Problems:   Sickle cell pain crisis (HCC)   Sickle  cell anemia (HCC)   Generalized anxiety disorder   Cocaine abuse (HCC) Sickle cell anemia with pain crisis: Decrease IV fluids to KVO Continue IV Toradol 15 mg every 6 hours Tylenol 1000 mg every 6 hours as needed for mild to moderate pain Increase oxycodone to 15 mg every 4 hours while awake Continue Dilaudid 1 mg subcu every 4 hours as needed for severe breakthrough pain Maintain oxygen saturation above 90% Monitor vital signs  closely Reevaluate pain frequently Leukocytosis: Improved since admission.  Patient continues to be afebrile.  Reactive and related to vaso-occlusive sickle cell crisis.  Continue to monitor closely.  CBC in a.m.  Anemia of chronic disease: Hemoglobin today is 6.6, which is below baseline.  Suspected to be hemodilution, will decrease IV fluids.  Repeat CBC in a.m. and type and screen.  If hemoglobin below 6.0, transfuse 1 unit of PRBCs.  Generalized anxiety: Patient is at increased anxiety throughout admission.  She currently denies suicidal homicidal intent Stable.  No further interventions warranted at this time.  Code Status: Full Code Family Communication: N/A Disposition Plan: Not yet ready for discharge  [place], MSN, FNP-C Patient [place] [address] [place], [place] [address] [phone]  If 5PM-7AM, please contact night-coverage.  10/21/2018, 3:37 PM  LOS: 0 days

## 2018-10-22 DIAGNOSIS — Z008 Encounter for other general examination: Secondary | ICD-10-CM

## 2018-10-22 DIAGNOSIS — F1494 Cocaine use, unspecified with cocaine-induced mood disorder: Secondary | ICD-10-CM

## 2018-10-22 LAB — PREPARE RBC (CROSSMATCH)

## 2018-10-22 LAB — CBC
HEMATOCRIT: 18 % — AB (ref 36.0–46.0)
Hemoglobin: 6.1 g/dL — CL (ref 12.0–15.0)
MCH: 34.5 pg — ABNORMAL HIGH (ref 26.0–34.0)
MCHC: 33.9 g/dL (ref 30.0–36.0)
MCV: 101.7 fL — ABNORMAL HIGH (ref 80.0–100.0)
Platelets: 409 10*3/uL — ABNORMAL HIGH (ref 150–400)
RBC: 1.77 MIL/uL — ABNORMAL LOW (ref 3.87–5.11)
RDW: 17.7 % — ABNORMAL HIGH (ref 11.5–15.5)
WBC: 14 10*3/uL — ABNORMAL HIGH (ref 4.0–10.5)
nRBC: 2.6 % — ABNORMAL HIGH (ref 0.0–0.2)

## 2018-10-22 LAB — HEMOGLOBIN AND HEMATOCRIT, BLOOD
HCT: 21.5 % — ABNORMAL LOW (ref 36.0–46.0)
HEMOGLOBIN: 7.5 g/dL — AB (ref 12.0–15.0)

## 2018-10-22 MED ORDER — IBUPROFEN 800 MG PO TABS: 800.0000 mg | ORAL_TABLET | Freq: Three times a day (TID) | ORAL | 0 refills | Status: DC | PRN

## 2018-10-22 MED ORDER — OXYCODONE HCL 10 MG PO TABS: 10.0000 mg | ORAL_TABLET | ORAL | 0 refills | Status: AC | PRN

## 2018-10-22 MED ORDER — LORAZEPAM 2 MG/ML IJ SOLN
0.5000 mg | Freq: Once | INTRAMUSCULAR | Status: AC
  Administered 2018-10-22: 0.5 mg via INTRAVENOUS
  Filled 2018-10-22: qty 1

## 2018-10-22 MED ORDER — LIP MEDEX EX OINT
TOPICAL_OINTMENT | CUTANEOUS | Status: AC
  Filled 2018-10-22: qty 7

## 2018-10-22 MED ORDER — SODIUM CHLORIDE 0.9% IV SOLUTION
Freq: Once | INTRAVENOUS | Status: AC
  Administered 2018-10-22: 11:00:00 via INTRAVENOUS

## 2018-10-22 NOTE — Consult Note (Signed)
[facility] Face-to-Face Psychiatry Consult   Reason for Consult:  Angry, agitated and anxious Referring Physician:  Dr. Doreene Burke  Patient Identification: [name] MRN:  [id number] Principal Diagnosis: Cocaine-induced mood disorder ([place]) Diagnosis:  Active Problems:   Sickle cell pain crisis (HCC)   Anemia of chronic disease   Sickle cell anemia (HCC)   Generalized anxiety disorder   Cocaine abuse ([place])   Total Time spent with patient: 30 minutes  Subjective:   [name] is a 22 y.o. female patient admitted with sickle cell pain crisis.  HPI:   Per chart review, patient was admitted with sickle cell pain crisis. It has been difficult to adequately manage her pain. She has been requesting IV pain medications. UDS was positive for cocaine. She is not interested in discussing cocaine use or homelessness per recent progress note. She has been angry and belligerent towards staff. Of note, she was last seen in 02/2015 for similar presentation by psychiatry. At this time she did not warrant medication management and was psychiatrically cleared.  On interview, [name] is drowsy and provides minimal responses to questions.  She reports worsening pain from sickle cell disease for the past 2 weeks.  She reports feeling anxious in the setting of pain and medical problems.  She endorses generalized worries and tachycardia due to anxiety.  She denies a prior psychiatric history.  She denies SI, HI or AVH.  She denies problems with sleep or appetite.  She denies a history of manic symptoms (decreased need for sleep, increased energy, pressured speech or euphoria).  She has not taken psychotropic medications in the past.  She denies substance abuse although UDS was positive for cocaine on admission and has also been positive at multiple hospitalizations in the past (7/18, 11/11 and 11/14).   Past Psychiatric History: Denies   Risk to Self:  None. Denies SI.  Risk to Others:  None.  Denies HI. Prior Inpatient Therapy:  Denies  Prior Outpatient Therapy:  Denies   Past Medical History:  Past Medical History:  Diagnosis Date  . Acute kidney injury ([place]) 05/15/2016  . Amenorrhea   . Anemia    SICKLE CELL  . Asthma   . Drug-induced pruritus 02/26/2015  . GERD (gastroesophageal reflux disease)   . Headache   . Hyperbilirubinemia 02/26/2015  . Sickle cell disease ([place])     Past Surgical History:  Procedure Laterality Date  . 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 TONSILLECTOMY     Age 64   Family History:  Family History  Problem Relation Age of Onset  . Hypertension Paternal Grandfather   . Sickle cell trait Father   . Cancer Mother        Died in 38   Family Psychiatric  History: Denies  Social History:  Social History   Substance and Sexual Activity  Alcohol Use No  . Alcohol/week: 6.0 standard drinks  . Types: 6 Shots of liquor per week   Comment: Denies 06/12/2014     Social History   Substance and Sexual Activity  Drug Use Yes  . Types: Marijuana, Cocaine    Social History   Socioeconomic History  . Marital status: Single    Spouse name: Not on file  . Number of children: Not on file  . Years of education: Not on file  . Highest education level: Not on file  Occupational History  . Occupation: unemployed  Social Needs  . Financial resource strain: Not  on file  . Food insecurity:    Worry: Not on file    Inability: Not on file  . Transportation needs:    Medical: Not on file    Non-medical: Not on file  Tobacco Use  . Smoking status: Current Every Day Smoker    Packs/day: 0.50    Years: 10.00    Pack years: 5.00    Types: Cigarettes  . Smokeless tobacco: Never Used  Substance and Sexual Activity  . Alcohol use: No    Alcohol/week: 6.0 standard drinks    Types: 6 Shots of liquor per week    Comment: Denies 06/12/2014  . Drug use: Yes    Types: Marijuana, Cocaine  . Sexual activity: Yes    Birth control/protection:  Condom    Comment: Stable relationship, trying X2 years for pregnancy; [name] is boyfriend (52)  Lifestyle  . Physical activity:    Days per week: Not on file    Minutes per session: Not on file  . Stress: Not on file  Relationships  . Social connections:    Talks on phone: Not on file    Gets together: Not on file    Attends religious service: Not on file    Active member of club or organization: Not on file    Attends meetings of clubs or organizations: Not on file    Relationship status: Not on file  Other Topics Concern  . Not on file  Social History Narrative   Pt is doing much better in school [employer] 11th grade)   Really wants to go to college now after talking to her cousin.  Pt has aspiration of being a lawyer/physician/nurse/owning business after school.   Pt is now living with her father again.           Mom died at age 80 and patient has a lot of fears abotu dying young and really wants to be a mom but has other goals. She was 13 at time of moms death and does not have much contact with her 23 year old biological sister which saddens her.                   Additional Social History: She lives alone. She does not have children and has never been married. She is unemployed. She denies alcohol or illicit substance use. UDS is positive for cocaine.     Allergies:  No Known Allergies  Labs:  Results for orders placed or performed during the hospital encounter of 10/18/18 (from the past 48 hour(s))  CBC     Status: Abnormal   Collection Time: 10/21/18  9:18 AM  Result Value Ref Range   WBC 14.0 (H) 4.0 - 10.5 K/uL   RBC 1.85 (L) 3.87 - 5.11 MIL/uL   Hemoglobin 6.6 (LL) 12.0 - 15.0 g/dL    Comment: REPEATED TO VERIFY THIS CRITICAL RESULT HAS VERIFIED AND BEEN CALLED TO D GUNDLACH,RN BY JACQUELYN HOLMES ON 12 23 2019 AT 0941, AND HAS BEEN READ BACK. CRITICAL RESULTS VERIFIED    HCT 19.2 (L) 36.0 - 46.0 %   MCV 103.8 (H) 80.0 - 100.0 fL   MCH 35.7 (H) 26.0 - 34.0 pg    MCHC 34.4 30.0 - 36.0 g/dL   RDW 17.4 (H) 11.5 - 15.5 %   Platelets 390 150 - 400 K/uL   nRBC 2.8 (H) 0.0 - 0.2 %    Comment: Performed at [facility], [address] [place],  [place] [address]  Basic metabolic panel     Status: Abnormal   Collection Time: 10/21/18  9:18 AM  Result Value Ref Range   Sodium 137 135 - 145 mmol/L   Potassium 4.4 3.5 - 5.1 mmol/L   Chloride 106 98 - 111 mmol/L   CO2 24 22 - 32 mmol/L   Glucose, Bld 119 (H) 70 - 99 mg/dL   BUN 14 6 - 20 mg/dL   Creatinine, Ser 0.66 0.44 - 1.00 mg/dL   Calcium 8.4 (L) 8.9 - 10.3 mg/dL   GFR calc non Af Amer >60 >60 mL/min   GFR calc Af Amer >60 >60 mL/min   Anion gap 7 5 - 15    Comment: Performed at [facility], [place] [address] [place], [place] [address]  CBC     Status: Abnormal   Collection Time: 10/22/18  5:37 AM  Result Value Ref Range   WBC 14.0 (H) 4.0 - 10.5 K/uL   RBC 1.77 (L) 3.87 - 5.11 MIL/uL   Hemoglobin 6.1 (LL) 12.0 - 15.0 g/dL    Comment: REPEATED TO VERIFY CRITICAL VALUE NOTED.  VALUE IS CONSISTENT WITH PREVIOUSLY REPORTED AND CALLED VALUE.    HCT 18.0 (L) 36.0 - 46.0 %   MCV 101.7 (H) 80.0 - 100.0 fL   MCH 34.5 (H) 26.0 - 34.0 pg   MCHC 33.9 30.0 - 36.0 g/dL   RDW 17.7 (H) 11.5 - 15.5 %   Platelets 409 (H) 150 - 400 K/uL   nRBC 2.6 (H) 0.0 - 0.2 %    Comment: Performed at [facility], [place] [address], [place], [place] [address]  Type and screen [place]     Status: None (Preliminary result)   Collection Time: 10/22/18  5:37 AM  Result Value Ref Range   ABO/RH(D) B POS    Antibody Screen NEG    Sample Expiration 10/25/2018    Unit Number [id number]    Blood Component Type RED CELLS,LR    Unit division 00    Status of Unit ISSUED    Donor AG Type      NEGATIVE FOR C ANTIGEN NEGATIVE FOR E ANTIGEN NEGATIVE FOR KELL ANTIGEN   Transfusion Status OK TO TRANSFUSE    Crossmatch Result      Compatible Performed  at [facility], [place] [address], [place], [place] [address]   Prepare RBC     Status: None   Collection Time: 10/22/18  8:21 AM  Result Value Ref Range   Order Confirmation      ORDER PROCESSED BY BLOOD BANK Performed at [facility] [address] [place], [place] [address]     Current Facility-Administered Medications  Medication Dose Route Frequency Provider Last Rate Last Dose  . 0.45 % sodium chloride infusion   Intravenous Continuous Dorena Dew, FNP 10 mL/hr at 10/21/18 1539    . acetaminophen (TYLENOL) tablet 1,000 mg  1,000 mg Oral Q6H PRN Dorena Dew, FNP   1,000 mg at 10/20/18 1825  . enoxaparin (LOVENOX) injection 40 mg  40 mg Subcutaneous Q24H Dorena Dew, FNP   40 mg at 10/21/18 1708  . HYDROmorphone (DILAUDID) injection 1 mg  1 mg Subcutaneous Q6H PRN Dorena Dew, FNP   1 mg at 10/22/18 7322  . hydrOXYzine (ATARAX/VISTARIL) tablet 10 mg  10 mg Oral Q4H PRN Dorena Dew, FNP   10 mg at 10/18/18 1804  . ketorolac (  TORADOL) 15 MG/ML injection 15 mg  15 mg Intravenous Q6H Dorena Dew, FNP   15 mg at 10/22/18 1125  . ondansetron (ZOFRAN) tablet 4 mg  4 mg Oral Q4H PRN Dorena Dew, FNP       Or  . ondansetron [facility]) injection 4 mg  4 mg Intravenous Q4H PRN Dorena Dew, FNP      . oxyCODONE (Oxy IR/ROXICODONE) immediate release tablet 15 mg  15 mg Oral Q4H while awake Dorena Dew, FNP   15 mg at 10/22/18 1025  . polyethylene glycol (MIRALAX / GLYCOLAX) packet 17 g  17 g Oral Daily PRN Dorena Dew, FNP      . senna-docusate (Senokot-S) tablet 1 tablet  1 tablet Oral BID Dorena Dew, FNP   1 tablet at 10/22/18 1025    Musculoskeletal: Strength & Muscle Tone: within normal limits Gait & Station: UTA since patient is lying in bed. Patient leans: N/A  Psychiatric Specialty Exam: Physical Exam  Nursing note and vitals reviewed. Constitutional: She is oriented to person,  place, and time. She appears well-developed and well-nourished.  HENT:  Head: Normocephalic and atraumatic.  Neck: Normal range of motion.  Respiratory: Effort normal.  Musculoskeletal: Normal range of motion.  Neurological: She is alert and oriented to person, place, and time.  Psychiatric: She has a normal mood and affect. Her speech is normal and behavior is normal. Judgment and thought content normal. Cognition and memory are normal.    Review of Systems  Cardiovascular: Positive for chest pain.  Gastrointestinal: Negative for abdominal pain, constipation, diarrhea, nausea and vomiting.  Psychiatric/Behavioral: Positive for substance abuse. Negative for depression, hallucinations and suicidal ideas. The patient is nervous/anxious. The patient does not have insomnia.   All other systems reviewed and are negative.   Blood pressure 106/63, pulse 87, temperature 97.7 F (36.5 C), temperature source Oral, resp. rate 16, height 5\' 4"  (1.626 m), weight 61.2 kg, SpO2 95 %.Body mass index is 23.17 kg/m.  General Appearance: Fairly Groomed, young, African American female, wearing a hospital gown with hair in locs who is lying in bed. NAD.   Eye Contact:  Fair. She is drowsy and falls asleep multiple times throughout interview.   Speech:  Clear and Coherent and Normal Rate  Volume:  Normal  Mood:  Anxious  Affect:  Appropriate  Thought Process:  Goal Directed, Linear and Descriptions of Associations: Intact  Orientation:  Full (Time, Place, and Person)  Thought Content:  Logical  Suicidal Thoughts:  No  Homicidal Thoughts:  No  Memory:  Immediate;   Good Recent;   Good Remote;   Good  Judgement:  Fair  Insight:  Fair  Psychomotor Activity:  Normal  Concentration:  Concentration: Good and Attention Span: Good  Recall:  Good  Fund of Knowledge:  Good  Language:  Good  Akathisia:  No  Handed:  Right  AIMS (if indicated):   N/A  Assets:  Communication Skills Desire for  Improvement Housing  ADL's:  Intact  Cognition:  WNL  Sleep:   Okay   Assessment:  [name] is a 22 y.o. female who was admitted with sickle cell pain crisis. It has been difficult for the primary team to manage her care due to agitation. She is calm and appropriate on interview. She does endorse anxiety. She denies substance abuse although UDS is positive for cocaine. Recommend Gabapentin for cocaine use and anxiety. She denies SI, HI or AVH and does  not warrant inpatient psychiatric hospitalization at this time.   Treatment Plan Summary: -Start Gabapentin 100 mg TID for cocaine use/anxiety. Can increase up to 300 mg TID. -Please provide patient with outpatient mental health resources for further medication management. -Psychiatry will sign off on patient at this time. Please consult psychiatry again as needed.   Disposition: No evidence of imminent risk to self or others at present.   Patient does not meet criteria for psychiatric inpatient admission.  Faythe Dingwall, DO 10/22/2018 12:54 PM

## 2018-10-22 NOTE — Progress Notes (Addendum)
Subjective: Patient states that pain intensity has not been controlled throughout admission.  Current pain intensity is 10/10.  Patient writhing in pain on arrival.  She is requesting IV pain medications.  Urine drug screen on 10/17/2018 was positive for cocaine. Patient states that she is not interested in discussing cocaine use or homelessness at this time. She is very belligerent and angry. She says, "my heart hurts".   She denies headache, dizziness, shortness of breath, paresthesias, weight loss, fevers or chills.  Today hemoglobin has decreased to 6.1, which is below baseline of 7.0-8.0.  Objective:  Vital signs in last 24 hours:  Vitals:   10/21/18 2122 10/22/18 0211 10/22/18 0608 10/22/18 0853  BP: 108/61 (!) 99/55 (!) 93/55 (!) 103/51  Pulse: 83 77 74 79  Resp: 18 16 16 16   Temp: 99.1 F (37.3 C) 98 F (36.7 C) 97.8 F (36.6 C) 98.2 F (36.8 C)  TempSrc: Oral Oral Oral Oral  SpO2: 94% 95% 95% 94%  Weight:      Height:        Intake/Output from previous day:   Intake/Output Summary (Last 24 hours) at 10/22/2018 1021 Last data filed at 10/22/2018 0700 Gross per 24 hour  Intake 1280.35 ml  Output -  Net 1280.35 ml    Physical Exam: General: Alert, awake, oriented x3, in apparent distress.  HEENT: [place]/AT PEERL, EOMI Neck: Trachea midline,  no masses, no thyromegal,y no JVD, no carotid bruit OROPHARYNX:  Moist, No exudate/ erythema/lesions.  Heart: Regular rate and rhythm, without murmurs, rubs, gallops, PMI non-displaced, no heaves or thrills on palpation.  Lungs: Clear to auscultation, no wheezing or rhonchi noted. No increased vocal fremitus resonant to percussion  Abdomen: Soft, nontender, nondistended, positive bowel sounds, no masses no hepatosplenomegaly noted..  Neuro: No focal neurological deficits noted cranial nerves II through XII grossly intact. DTRs 2+ bilaterally upper and lower extremities. Strength 5 out of 5 in bilateral upper and lower  extremities. Musculoskeletal: No warm swelling or erythema around joints, no spinal tenderness noted. Psychiatric: Patient alert and oriented x3, Anxious, agitated, tearful affect  Lymph node survey: No cervical axillary or inguinal lymphadenopathy noted.  Lab Results:  Basic Metabolic Panel:    Component Value Date/Time   NA 137 10/21/2018 0918   K 4.4 10/21/2018 0918   CL 106 10/21/2018 0918   CO2 24 10/21/2018 0918   BUN 14 10/21/2018 0918   CREATININE 0.66 10/21/2018 0918   GLUCOSE 119 (H) 10/21/2018 0918   CALCIUM 8.4 (L) 10/21/2018 0918   CBC:    Component Value Date/Time   WBC 14.0 (H) 10/22/2018 0537   HGB 6.1 (LL) 10/22/2018 0537   HCT 18.0 (L) 10/22/2018 0537   PLT 409 (H) 10/22/2018 0537   MCV 101.7 (H) 10/22/2018 0537   NEUTROABS 16.6 (H) 10/18/2018 0105   LYMPHSABS 6.4 (H) 10/18/2018 0105   MONOABS 1.4 (H) 10/18/2018 0105   EOSABS 0.0 10/18/2018 0105   BASOSABS 0.0 10/18/2018 0105    No results found for this or any previous visit (from the past 240 hour(s)).  Studies/Results: No results found.  Medications: Scheduled Meds: . sodium chloride   Intravenous Once  . enoxaparin (LOVENOX) injection  40 mg Subcutaneous Q24H  . ketorolac  15 mg Intravenous Q6H  . oxyCODONE  15 mg Oral Q4H while awake  . senna-docusate  1 tablet Oral BID   Continuous Infusions: . sodium chloride 10 mL/hr at 10/21/18 1539   PRN Meds:.acetaminophen, HYDROmorphone (DILAUDID) injection, hydrOXYzine, ondansetron **  OR** ondansetron (ZOFRAN) IV, polyethylene glycol Assessment/Plan: Active Problems:   Sickle cell pain crisis (HCC)   Anemia of chronic disease   Sickle cell anemia (HCC)   Generalized anxiety disorder   Cocaine abuse (HCC)   Sickle cell anemia with pain crisis: Decrease IV fluids to KVO Continue IV Toradol 15 mg every 6 hours Tylenol 1000 mg every 6 hours as needed for mild to moderate pain Increased oxycodone to 15 mg every 4 hours while awake Continue  Dilaudid 1 mg subcutaneous every 4 hours as needed for severe breakthrough pain Maintain oxygen saturation above 90% Monitor vital signs closely Reevaluate pain frequently   Leukocytosis: Improved since admission.  Patient continues to be afebrile.  Reactive and related to vaso-occlusive sickle cell crisis.  Continue to monitor closely.  CBC in a.m.  Anemia of chronic disease: Hemoglobin today is 6.1, which is below baseline.  Will transfuse 1 unit of packed red blood cells.   Generalized anxiety: Patient has increased anxiety, irritability, and agitation. She is beligerant and using profanity to staff.  She currently denies suicidal homicidal intent Ativan 0.5 mg times one for increased agitation.   Polysubstance abuse:  All notes including H&P, demographics, and labs faxed to [place].   Homelessness: Social work consult placed. Spoke with patient's aunt Rosemary Holms, who states that family is not willing to provide housing until patient completes drug rehabilitation.   Code Status: Full Code Family Communication: N/A Disposition Plan: Not yet ready for discharge. Discharge planned for 10/23/2018  Donia Pounds  APRN, MSN, FNP-C Patient [address], [place] [address] [phone]  If 5PM-7AM, please contact night-coverage.  10/22/2018, 10:21 AM  LOS: 0 days

## 2018-10-22 NOTE — Plan of Care (Signed)
Patient continues to c/o pain in bilateral arms, tolerating regular diet without issue.  Received 1 unit PRBC's this shift with improvement in hemoglobin.  Anticipate DC 10/23/18.

## 2018-10-23 DIAGNOSIS — Z832 Family history of diseases of the blood and blood-forming organs and certain disorders involving the immune mechanism: Secondary | ICD-10-CM | POA: Diagnosis not present

## 2018-10-23 DIAGNOSIS — F411 Generalized anxiety disorder: Secondary | ICD-10-CM | POA: Diagnosis present

## 2018-10-23 DIAGNOSIS — Z590 Homelessness: Secondary | ICD-10-CM | POA: Diagnosis not present

## 2018-10-23 DIAGNOSIS — Z9119 Patient's noncompliance with other medical treatment and regimen: Secondary | ICD-10-CM | POA: Diagnosis not present

## 2018-10-23 DIAGNOSIS — F17210 Nicotine dependence, cigarettes, uncomplicated: Secondary | ICD-10-CM | POA: Diagnosis present

## 2018-10-23 DIAGNOSIS — F1414 Cocaine abuse with cocaine-induced mood disorder: Secondary | ICD-10-CM | POA: Diagnosis present

## 2018-10-23 DIAGNOSIS — D638 Anemia in other chronic diseases classified elsewhere: Secondary | ICD-10-CM | POA: Diagnosis present

## 2018-10-23 DIAGNOSIS — F1110 Opioid abuse, uncomplicated: Secondary | ICD-10-CM | POA: Diagnosis present

## 2018-10-23 DIAGNOSIS — D5700 Hb-SS disease with crisis, unspecified: Secondary | ICD-10-CM | POA: Diagnosis present

## 2018-10-23 DIAGNOSIS — K219 Gastro-esophageal reflux disease without esophagitis: Secondary | ICD-10-CM | POA: Diagnosis present

## 2018-10-23 DIAGNOSIS — G894 Chronic pain syndrome: Secondary | ICD-10-CM | POA: Diagnosis present

## 2018-10-23 DIAGNOSIS — D72829 Elevated white blood cell count, unspecified: Secondary | ICD-10-CM | POA: Diagnosis present

## 2018-10-23 LAB — CBC WITH DIFFERENTIAL/PLATELET
Abs Immature Granulocytes: 0.09 10*3/uL — ABNORMAL HIGH (ref 0.00–0.07)
Basophils Absolute: 0 10*3/uL (ref 0.0–0.1)
Basophils Relative: 0 %
Eosinophils Absolute: 0.3 10*3/uL (ref 0.0–0.5)
Eosinophils Relative: 2 %
HCT: 22.2 % — ABNORMAL LOW (ref 36.0–46.0)
Hemoglobin: 7.6 g/dL — ABNORMAL LOW (ref 12.0–15.0)
Immature Granulocytes: 1 %
Lymphocytes Relative: 32 %
Lymphs Abs: 4.2 10*3/uL — ABNORMAL HIGH (ref 0.7–4.0)
MCH: 33.6 pg (ref 26.0–34.0)
MCHC: 34.2 g/dL (ref 30.0–36.0)
MCV: 98.2 fL (ref 80.0–100.0)
Monocytes Absolute: 0.7 10*3/uL (ref 0.1–1.0)
Monocytes Relative: 5 %
Neutro Abs: 7.8 10*3/uL — ABNORMAL HIGH (ref 1.7–7.7)
Neutrophils Relative %: 60 %
Platelets: 501 10*3/uL — ABNORMAL HIGH (ref 150–400)
RBC: 2.26 MIL/uL — ABNORMAL LOW (ref 3.87–5.11)
RDW: 18.1 % — ABNORMAL HIGH (ref 11.5–15.5)
WBC: 13.1 10*3/uL — ABNORMAL HIGH (ref 4.0–10.5)
nRBC: 2.3 % — ABNORMAL HIGH (ref 0.0–0.2)

## 2018-10-23 LAB — TYPE AND SCREEN
ABO/RH(D): B POS
Antibody Screen: NEGATIVE
Unit division: 0

## 2018-10-23 LAB — BASIC METABOLIC PANEL
Anion gap: 6 (ref 5–15)
BUN: 18 mg/dL (ref 6–20)
CO2: 25 mmol/L (ref 22–32)
Calcium: 8.7 mg/dL — ABNORMAL LOW (ref 8.9–10.3)
Chloride: 108 mmol/L (ref 98–111)
Creatinine, Ser: 0.8 mg/dL (ref 0.44–1.00)
GFR calc Af Amer: >60 mL/min (ref >60)
GFR calc non Af Amer: >60 mL/min (ref >60)
Glucose, Bld: 124 mg/dL — ABNORMAL HIGH (ref 70–99)
Potassium: 4.4 mmol/L (ref 3.5–5.1)
Sodium: 139 mmol/L (ref 135–145)

## 2018-10-23 LAB — BPAM RBC
Blood Product Expiration Date: 202002012359
ISSUE DATE / TIME: 201912241131
Unit Type and Rh: 5100

## 2018-10-23 LAB — MAGNESIUM: Magnesium: 2.3 mg/dL (ref 1.7–2.4)

## 2018-10-23 MED ORDER — ONDANSETRON HCL 4 MG PO TABS: 4.0000 mg | ORAL_TABLET | ORAL | 0 refills | Status: DC | PRN

## 2018-10-23 MED ORDER — SENNOSIDES-DOCUSATE SODIUM 8.6-50 MG PO TABS: 1.0000 | ORAL_TABLET | Freq: Two times a day (BID) | ORAL | 0 refills | Status: AC

## 2018-10-23 MED ORDER — GABAPENTIN 100 MG PO CAPS: 100.0000 mg | ORAL_CAPSULE | Freq: Three times a day (TID) | ORAL | 0 refills | Status: DC

## 2018-10-23 NOTE — Discharge Summary (Addendum)
Physician Discharge Summary  [name] [facility] PFX:[id number] DOB: [birth year] DOA: 10/18/2018  PCP: Dorena Dew, FNP  Admit date: 10/18/2018 Discharge date: 10/23/2018  Admitted From: Family's home Disposition: Family's home  Recommendations for Outpatient Follow-up:  1. Follow up with PCP in 1 week with repeat CBC/BMP 2. Comply with medications and follow-up 3. Follow-up in the ED if symptoms worsen or new appear   Home Health: No Equipment/Devices: None Discharge Condition: Stable CODE STATUS: Full Diet recommendation: Regular  Brief/Interim Summary: 22 year old female with history of sickle cell anemia, chronic pain syndrome, asthma, homelessness and cocaine abuse presented with generalized pain.  She was admitted for sickle cell pain crisis started on intravenous and oral pain meds.  She also had leukocytosis which was probably reactive.  She had to be transfused 1 unit of packed red cells because of drop in hemoglobin.  Psychiatry has evaluated the patient as well.  Patient is stable for discharge today with outpatient follow-up with PCP.  Discharge Diagnoses:  Principal Problem:   Cocaine-induced mood disorder (HCC) Active Problems:   Sickle cell pain crisis (HCC)   Anemia of chronic disease   Sickle cell anemia (HCC)   Generalized anxiety disorder   Cocaine abuse (HCC)  Sickle cell anemia with pain crisis  -Treated with intravenous Dilaudid along with oral oxycodone.  Pain has much improved.  Discharge patient home on ibuprofen and oral oxycodone as needed.  Outpatient follow-up with PCP -Patient was transfused 1 unit of packed red cells on 10/22/2018.  Hemoglobin is 7.6 today.  Outpatient follow-up  Leukocytosis -Most likely reactive from above.  Improving.  Polysubstance abuse -Patient was counseled regarding the same during the hospitalization.  Social worker consult  Anxiety  -We will start gabapentin 100 mg 3 times a day as per psychiatry  recommendations for cocaine use/anxiety.  Can increase up to 300 mg 3 times a day as an outpatient.  Homelessness -Education officer, museum consult    Discharge Instructions  Discharge Instructions    Call MD for:  difficulty breathing, headache or visual disturbances   Complete by:  As directed    Call MD for:  extreme fatigue   Complete by:  As directed    Call MD for:  hives   Complete by:  As directed    Call MD for:  persistant dizziness or light-headedness   Complete by:  As directed    Call MD for:  persistant nausea and vomiting   Complete by:  As directed    Call MD for:  severe uncontrolled pain   Complete by:  As directed    Call MD for:  temperature >100.4   Complete by:  As directed    Diet general   Complete by:  As directed    Increase activity slowly   Complete by:  As directed      Allergies as of 10/24/2018   No Known Allergies     Medication List    TAKE these medications   alum & mag hydroxide-simeth 200-200-20 MG/5ML suspension Commonly known as:  MAALOX/MYLANTA Take 30 mLs by mouth every 4 (four) hours as needed for indigestion or heartburn.   gabapentin 100 MG capsule Commonly known as:  NEURONTIN Take 1 capsule (100 mg total) by mouth 3 (three) times daily.   ibuprofen 800 MG tablet Commonly known as:  ADVIL,MOTRIN Take 1 tablet (800 mg total) by mouth every 8 (eight) hours as needed.   ondansetron 4 MG tablet Commonly known as:  ZOFRAN Take  1 tablet (4 mg total) by mouth every 4 (four) hours as needed for nausea.   Oxycodone HCl 10 MG Tabs Take 1 tablet (10 mg total) by mouth every 4 (four) hours as needed for up to 3 days for severe pain.   pantoprazole 40 MG tablet Commonly known as:  PROTONIX Take 1 tablet (40 mg total) by mouth daily.   senna-docusate 8.6-50 MG tablet Commonly known as:  Senokot-S Take 1 tablet by mouth 2 (two) times daily for 10 days.       Follow-up Information    Dorena Dew, FNP Follow up.   Specialty:   Family Medicine Why:  at earliest convenience Contact information: [address]. [place] [address] [phone]          No Known Allergies  Consultations:  Psychiatry   Procedures/Studies: Dg Chest 2 View  Result Date: 10/17/2018 CLINICAL DATA:  Right-sided chest pain.  Sickle cell pain crisis. EXAM: CHEST - 2 VIEW COMPARISON:  Chest x-ray from same day at 11:13. FINDINGS: The patient is rotated to the right. The heart size and mediastinal contours are within normal limits. Normal pulmonary vascularity. No focal consolidation, pleural effusion, or pneumothorax. No acute osseous abnormality. IMPRESSION: No active cardiopulmonary disease. Electronically Signed   By: Titus Dubin M.D.   On: 10/17/2018 21:53   Dg Chest 2 View  Result Date: 10/17/2018 CLINICAL DATA:  Chest pain. EXAM: CHEST - 2 VIEW COMPARISON:  Radiographs of May 14, 2017. FINDINGS: The heart size and mediastinal contours are within normal limits. Both lungs are clear. No pneumothorax or pleural effusion is noted. The visualized skeletal structures are unremarkable. IMPRESSION: No active cardiopulmonary disease. Electronically Signed   By: Marijo Conception, M.D.   On: 10/17/2018 11:19      Subjective: Patient seen and examined at bedside.  She denies any worsening pain, nausea or vomiting or fevers.  She thinks that she is ready to go home today.  Discharge Exam: Vitals:   10/22/18 2115 10/23/18 0529  BP: 95/60 (!) 102/59  Pulse: 79 71  Resp: 16 16  Temp: 99 F (37.2 C) 99 F (37.2 C)  SpO2: 94% 94%   Vitals:   10/22/18 1157 10/22/18 1425 10/22/18 2115 10/23/18 0529  BP: 106/63 108/64 95/60 (!) 102/59  Pulse: 87 (!) 101 79 71  Resp: 16 16 16 16   Temp: 97.7 F (36.5 C) 98.8 F (37.1 C) 99 F (37.2 C) 99 F (37.2 C)  TempSrc: Oral Oral Oral Oral  SpO2: 95% 97% 94% 94%  Weight:      Height:        General: Pt is alert, awake, not in acute distress.  Looks older than stated  age Cardiovascular: rate controlled, S1/S2 + Respiratory: bilateral decreased breath sounds at bases Abdominal: Soft, NT, ND, bowel sounds + Extremities: no edema, no cyanosis    The results of significant diagnostics from this hospitalization (including imaging, microbiology, ancillary and laboratory) are listed below for reference.     Microbiology: No results found for this or any previous visit (from the past 240 hour(s)).   Labs: BNP (last 3 results) No results for input(s): BNP in the last 8760 hours. Basic Metabolic Panel: Recent Labs  Lab 10/17/18 1203 10/17/18 1900 10/18/18 1051 10/19/18 0457 10/21/18 0918 10/23/18 0825  NA 141 139  --  135 137 139  K 4.3 4.8  --  4.2 4.4 4.4  CL 108 107  --  102  106 108  CO2 21* 21*  --  24 24 25   GLUCOSE 105* 129*  --  90 119* 124*  BUN 12 9  --  8 14 18   CREATININE 0.87 0.80 0.80 0.63 0.66 0.80  CALCIUM 8.9 9.7  --  8.8* 8.4* 8.7*  MG  --   --   --   --   --  2.3   Liver Function Tests: Recent Labs  Lab 10/17/18 1203 10/17/18 1900  AST 26 26  ALT 18 16  ALKPHOS 68 70  BILITOT 4.3* 4.4*  PROT 7.4 7.4  ALBUMIN 4.3 4.4   No results for input(s): LIPASE, AMYLASE in the last 168 hours. No results for input(s): AMMONIA in the last 168 hours. CBC: Recent Labs  Lab 10/18/18 0105 10/18/18 1051 10/19/18 0457 10/21/18 0918 10/22/18 0537 10/22/18 1707 10/23/18 0825  WBC 27.6* 26.2* 18.0* 14.0* 14.0*  --  13.1*  NEUTROABS 16.6*  --   --   --   --   --  7.8*  HGB 8.3* 7.9* 8.5* 6.6* 6.1* 7.5* 7.6*  HCT 23.6* 22.9* 23.9* 19.2* 18.0* 21.5* 22.2*  MCV 99.6 104.6* 100.8* 103.8* 101.7*  --  98.2  PLT 501* 497* 449* 390 409*  --  501*   Cardiac Enzymes: No results for input(s): CKTOTAL, CKMB, CKMBINDEX, TROPONINI in the last 168 hours. BNP: Invalid input(s): POCBNP CBG: No results for input(s): GLUCAP in the last 168 hours. D-Dimer No results for input(s): DDIMER in the last 72 hours. Hgb A1c No results for  input(s): HGBA1C in the last 72 hours. Lipid Profile No results for input(s): CHOL, HDL, LDLCALC, TRIG, CHOLHDL, LDLDIRECT in the last 72 hours. Thyroid function studies No results for input(s): TSH, T4TOTAL, T3FREE, THYROIDAB in the last 72 hours.  Invalid input(s): FREET3 Anemia work up No results for input(s): VITAMINB12, FOLATE, FERRITIN, TIBC, IRON, RETICCTPCT in the last 72 hours. Urinalysis    Component Value Date/Time   COLORURINE YELLOW 10/18/2018 0242   APPEARANCEUR CLOUDY (A) 10/18/2018 0242   LABSPEC 1.015 10/18/2018 0242   PHURINE 6.0 10/18/2018 0242   GLUCOSEU NEGATIVE 10/18/2018 0242   HGBUR TRACE (A) 10/18/2018 0242   BILIRUBINUR NEGATIVE 10/18/2018 0242   KETONESUR NEGATIVE 10/18/2018 0242   PROTEINUR NEGATIVE 10/18/2018 0242   UROBILINOGEN 1.0 08/11/2015 1948   NITRITE NEGATIVE 10/18/2018 0242   LEUKOCYTESUR TRACE (A) 10/18/2018 0242   Sepsis Labs Invalid input(s): PROCALCITONIN,  WBC,  LACTICIDVEN Microbiology No results found for this or any previous visit (from the past 240 hour(s)).   Time coordinating discharge: 35 minutes  SIGNED:   Aline August, MD  [facility] 10/23/2018, 10:59 AM Pager: [phone]  If 7PM-7AM, please contact night-coverage [email or web address] Password [email or web address]

## 2018-10-23 NOTE — Progress Notes (Signed)
Met with pt about housing concerns- pt states she has not had stable housing since December 29, 2011 when her mother died. Has been staying with her boyfriend and grandmother "and hotels and shelters when I need to." States she lost her disability/Medicaid due to "getting letters about it needing to be renewed but they were going to my grandmother's house so I didn't get them in time." States she is working with case worker at sickle cell clinic working on requirements to reinstate it. Pt to be DC'd today and boyfriend is coming to pick her up- will be staying with her grandmother. Pt denies need for SA treatment resources (cocaine use) - CSW included some in AVS.  Sharren Bridge, MSW, LCSW Clinical Social Work 10/23/2018 [phone] coverage for [phone]

## 2018-10-23 NOTE — Discharge Instructions (Signed)
Will schedule follow up in 1 week at [facility].  Also, contact [place] with assistance to get insurance reestablished, housing/transportation constraints.     Sickle Cell Anemia, Adult Sickle cell anemia is a condition where your red blood cells are shaped like sickles. Red blood cells carry oxygen through the body. Sickle-shaped cells do not live as long as normal red blood cells. They also clump together and block blood from flowing through the blood vessels. This prevents the body from getting enough oxygen. Sickle cell anemia causes organ damage and pain. It also increases the risk of infection. Follow these instructions at home: Medicines  Take over-the-counter and prescription medicines only as told by your doctor.  If you were prescribed an antibiotic medicine, take it as told by your doctor. Do not stop taking the antibiotic even if you start to feel better.  If you develop a fever, do not take medicines to lower the fever right away. Tell your doctor about the fever. Managing pain, stiffness, and swelling  Try these methods to help with pain: ? Use a heating pad. ? Take a warm bath. ? Distract yourself, such as by watching TV. Eating and drinking  Drink enough fluid to keep your pee (urine) clear or pale yellow. Drink more in hot weather and during exercise.  Limit or avoid alcohol.  Eat a healthy diet. Eat plenty of fruits, vegetables, whole grains, and lean protein.  Take vitamins and supplements as told by your doctor. Traveling  When traveling, keep these with you: ? Your medical information. ? The names of your doctors. ? Your medicines.  If you need to take an airplane, talk to your doctor first. Activity  Rest often.  Avoid exercises that make your heart beat much faster, such as jogging. General instructions  Do not use products that have nicotine or tobacco, such as cigarettes and e-cigarettes. If you need help  quitting, ask your doctor.  Consider wearing a medical alert bracelet.  Avoid being in high places (high altitudes), such as mountains.  Avoid very hot or cold temperatures.  Avoid places where the temperature changes a lot.  Keep all follow-up visits as told by your doctor. This is important. Contact a doctor if:  A joint hurts.  Your feet or hands hurt or swell.  You feel tired (fatigued). Get help right away if:  You have symptoms of infection. These include: ? Fever. ? Chills. ? Being very tired. ? Irritability. ? Poor eating. ? Throwing up (vomiting).  You feel dizzy or faint.  You have new stomach pain, especially on the left side.  You have a an erection (priapism) that lasts more than 4 hours.  You have numbness in your arms or legs.  You have a hard time moving your arms or legs.  You have trouble talking.  You have pain that does not go away when you take medicine.  You are short of breath.  You are breathing fast.  You have a long-term cough.  You have pain in your chest.  You have a bad headache.  You have a stiff neck.  Your stomach looks bloated even though you did not eat much.  Your skin is pale.  You suddenly cannot see well. Summary  Sickle cell anemia is a condition where your red blood cells are shaped like sickles.  Follow your doctor's advice on ways to manage pain, food to eat, activities to do, and steps to take for  safe travel.  Get medical help right away if you have any signs of infection, such as a fever. This information is not intended to replace advice given to you by your health care provider. Make sure you discuss any questions you have with your health care provider. Document Released: 08/06/2013 Document Revised: 11/21/2016 Document Reviewed: 11/21/2016 Elsevier Interactive Patient Education  2019 [employer].  Substance use treatment: Address: [address], [place], [place] [address] Phone: [phone]

## 2018-10-23 NOTE — Progress Notes (Signed)
Patient stating chest pain is 10/10. "Same pain as has been having". Also states she "want to stay one more night" as she "might just turn around and come back in". MD notified. PRN given. Orders obtained. Eulas Post, RN

## 2018-10-24 MED ORDER — PANTOPRAZOLE SODIUM 40 MG PO TBEC
40.0000 mg | DELAYED_RELEASE_TABLET | Freq: Every day | ORAL | Status: DC
  Administered 2018-10-24: 40 mg via ORAL
  Filled 2018-10-24: qty 1

## 2018-10-24 MED ORDER — ALUM & MAG HYDROXIDE-SIMETH 200-200-20 MG/5ML PO SUSP: 30.0000 mL | ORAL | Status: DC | PRN

## 2018-10-24 MED ORDER — PANTOPRAZOLE SODIUM 40 MG PO TBEC: 40.0000 mg | DELAYED_RELEASE_TABLET | Freq: Every day | ORAL | 0 refills | Status: DC

## 2018-10-24 MED ORDER — ALUM & MAG HYDROXIDE-SIMETH 200-200-20 MG/5ML PO SUSP: 30.0000 mL | ORAL | 0 refills | Status: DC | PRN

## 2018-10-24 MED FILL — oxyCODONE HCL 10 MG TABS: 10 | 3 days supply | Qty: 15 | Fill #0

## 2018-10-24 MED FILL — PANTOPRAZOLE SOD DR 40 MG T: 40 | 15 days supply | Qty: 15 | Fill #0

## 2018-10-24 MED FILL — IBUPROFEN 800 MG TAB: 800 | 10 days supply | Qty: 30 | Fill #0

## 2018-10-24 MED FILL — ONDANSETRON HCL 4 MG TABLET: 4 | 7 days supply | Qty: 20 | Fill #0

## 2018-10-24 MED FILL — GABAPENTIN 100 MG CAPSULE: 100 | 10 days supply | Qty: 30 | Fill #0

## 2018-10-24 NOTE — Progress Notes (Signed)
Patient continues to c/o chest pain left side rating it 9/10. No radiation. "Same pain I've been having". "Hard to take a deep breath". PRN recently given. EKG obtained. [name]

## 2018-10-24 NOTE — Progress Notes (Signed)
Patient ID: [name], female   DOB: Feb 05, 1996, 22 y.o.   MRN: [id number] Patient was supposed to be discharged on 10/23/2018 but complained of worsening pain.  Discharge was held.  Patient seen and examined at bedside.  Nursing staff reports that patient would be sleepy but after she would wake up from sleep, she was started complaining of severe pain and then would feel better after Dilaudid and oral pain meds.  EKG done this morning was reviewed and showed normal sinus rhythm, no ST-T wave changes.  Patient seen and examined at bedside, she is sleepy, wakes up slightly and states that she is okay to go home but when I asked her about her current pain, she states that she has 8 out of 10 pain, nonspecific.  Will discontinue Dilaudid.  Will add Protonix orally and Maalox as needed.  Will discharge the patient home.  Follow-up with PCP.  Please refer to the discharge summary done by me on 10/23/2018 for full details.

## 2019-03-01 ENCOUNTER — Emergency Department (HOSPITAL_COMMUNITY): Payer: Medicaid Other

## 2019-03-01 ENCOUNTER — Emergency Department (HOSPITAL_COMMUNITY)
Admission: EM | Admit: 2019-03-01 | Discharge: 2019-03-01 | Disposition: A | Discharge status: Home / Self Care | Payer: Medicaid Other | Attending: Emergency Medicine | Admitting: Emergency Medicine

## 2019-03-01 ENCOUNTER — Encounter (HOSPITAL_COMMUNITY): Payer: Self-pay | Admitting: Emergency Medicine

## 2019-03-01 ENCOUNTER — Other Ambulatory Visit: Payer: Self-pay

## 2019-03-01 DIAGNOSIS — D5700 Hb-SS disease with crisis, unspecified: Secondary | ICD-10-CM | POA: Insufficient documentation

## 2019-03-01 DIAGNOSIS — Z79899 Other long term (current) drug therapy: Secondary | ICD-10-CM | POA: Diagnosis not present

## 2019-03-01 DIAGNOSIS — J45909 Unspecified asthma, uncomplicated: Secondary | ICD-10-CM | POA: Insufficient documentation

## 2019-03-01 DIAGNOSIS — F1410 Cocaine abuse, uncomplicated: Secondary | ICD-10-CM | POA: Diagnosis not present

## 2019-03-01 DIAGNOSIS — F17210 Nicotine dependence, cigarettes, uncomplicated: Secondary | ICD-10-CM | POA: Insufficient documentation

## 2019-03-01 DIAGNOSIS — M79605 Pain in left leg: Secondary | ICD-10-CM | POA: Diagnosis present

## 2019-03-01 LAB — I-STAT BETA HCG BLOOD, ED (MC, WL, AP ONLY): I-stat hCG, quantitative: <5 m[IU]/mL (ref <5)

## 2019-03-01 LAB — ETHANOL: Alcohol, Ethyl (B): <10 mg/dL (ref <10)

## 2019-03-01 LAB — COMPREHENSIVE METABOLIC PANEL
ALT: 10 U/L (ref 0–44)
AST: 15 U/L (ref 15–41)
Albumin: 4.2 g/dL (ref 3.5–5.0)
Alkaline Phosphatase: 69 U/L (ref 38–126)
Anion gap: 7 (ref 5–15)
BUN: 5 mg/dL — ABNORMAL LOW (ref 6–20)
CO2: 27 mmol/L (ref 22–32)
Calcium: 8.9 mg/dL (ref 8.9–10.3)
Chloride: 107 mmol/L (ref 98–111)
Creatinine, Ser: 0.9 mg/dL (ref 0.44–1.00)
GFR calc Af Amer: >60 mL/min (ref >60)
GFR calc non Af Amer: >60 mL/min (ref >60)
Glucose, Bld: 101 mg/dL — ABNORMAL HIGH (ref 70–99)
Potassium: 3.4 mmol/L — ABNORMAL LOW (ref 3.5–5.1)
Sodium: 141 mmol/L (ref 135–145)
Total Bilirubin: 4.7 mg/dL — ABNORMAL HIGH (ref 0.3–1.2)
Total Protein: 7.2 g/dL (ref 6.5–8.1)

## 2019-03-01 LAB — CBC WITH DIFFERENTIAL/PLATELET
Abs Immature Granulocytes: 0.07 10*3/uL (ref 0.00–0.07)
Basophils Absolute: 0.1 10*3/uL (ref 0.0–0.1)
Basophils Relative: 1 %
Eosinophils Absolute: 0.1 10*3/uL (ref 0.0–0.5)
Eosinophils Relative: 1 %
HCT: 26.1 % — ABNORMAL LOW (ref 36.0–46.0)
Hemoglobin: 9.4 g/dL — ABNORMAL LOW (ref 12.0–15.0)
Immature Granulocytes: 1 %
Lymphocytes Relative: 21 %
Lymphs Abs: 2.7 10*3/uL (ref 0.7–4.0)
MCH: 36.3 pg — ABNORMAL HIGH (ref 26.0–34.0)
MCHC: 36 g/dL (ref 30.0–36.0)
MCV: 100.8 fL — ABNORMAL HIGH (ref 80.0–100.0)
Monocytes Absolute: 1 10*3/uL (ref 0.1–1.0)
Monocytes Relative: 8 %
Neutro Abs: 9.2 10*3/uL — ABNORMAL HIGH (ref 1.7–7.7)
Neutrophils Relative %: 68 %
Platelets: 512 10*3/uL — ABNORMAL HIGH (ref 150–400)
RBC: 2.59 MIL/uL — ABNORMAL LOW (ref 3.87–5.11)
RDW: 18.3 % — ABNORMAL HIGH (ref 11.5–15.5)
WBC: 13.2 10*3/uL — ABNORMAL HIGH (ref 4.0–10.5)
nRBC: 1.5 % — ABNORMAL HIGH (ref 0.0–0.2)

## 2019-03-01 LAB — RAPID URINE DRUG SCREEN, HOSP PERFORMED
Amphetamines: NOT DETECTED
Barbiturates: NOT DETECTED
Benzodiazepines: NOT DETECTED
Cocaine: POSITIVE — AB
Opiates: NOT DETECTED
Tetrahydrocannabinol: NOT DETECTED

## 2019-03-01 LAB — SALICYLATE LEVEL: Salicylate Lvl: <7 mg/dL (ref 2.8–30.0)

## 2019-03-01 LAB — RETICULOCYTES
Immature Retic Fract: 33 % — ABNORMAL HIGH (ref 2.3–15.9)
RBC.: 2.59 MIL/uL — ABNORMAL LOW (ref 3.87–5.11)
Retic Count, Absolute: 356.9 10*3/uL — ABNORMAL HIGH (ref 19.0–186.0)
Retic Ct Pct: 13.8 % — ABNORMAL HIGH (ref 0.4–3.1)

## 2019-03-01 LAB — D-DIMER, QUANTITATIVE: D-Dimer, Quant: 0.84 ug/mL-FEU — ABNORMAL HIGH (ref 0.00–0.50)

## 2019-03-01 LAB — ACETAMINOPHEN LEVEL: Acetaminophen (Tylenol), Serum: <10 ug/mL — ABNORMAL LOW (ref 10–30)

## 2019-03-01 MED ORDER — HYDROMORPHONE HCL 1 MG/ML IJ SOLN: 1.0000 mg | INTRAMUSCULAR | Status: DC | PRN

## 2019-03-01 MED ORDER — NAPROXEN 375 MG PO TABS
375.0000 mg | ORAL_TABLET | Freq: Once | ORAL | Status: AC
  Administered 2019-03-01: 375 mg via ORAL
  Filled 2019-03-01: qty 1

## 2019-03-01 MED ORDER — SODIUM CHLORIDE (PF) 0.9 % IJ SOLN
INTRAMUSCULAR | Status: AC
  Filled 2019-03-01: qty 50

## 2019-03-01 MED ORDER — IOHEXOL 350 MG/ML SOLN
100.0000 mL | Freq: Once | INTRAVENOUS | Status: AC | PRN
  Administered 2019-03-01: 100 mL via INTRAVENOUS

## 2019-03-01 MED ORDER — SODIUM CHLORIDE 0.45 % IV SOLN
INTRAVENOUS | Status: DC
  Administered 2019-03-01: 15:00:00 via INTRAVENOUS

## 2019-03-01 MED ORDER — HYDROMORPHONE HCL 1 MG/ML IJ SOLN
1.0000 mg | INTRAMUSCULAR | Status: DC | PRN
  Filled 2019-03-01: qty 1

## 2019-03-01 NOTE — ED Notes (Signed)
XR at bedside

## 2019-03-01 NOTE — ED Notes (Signed)
[name]

## 2019-03-01 NOTE — ED Notes (Signed)
Patient transported to CT 

## 2019-03-01 NOTE — ED Notes (Signed)
Unsuccessful IV attempt x2 by this RN. Additional unsuccessful attempt by [facility] RN.

## 2019-03-01 NOTE — ED Notes (Signed)
Patient A&Ox4 at time of discharge.

## 2019-03-01 NOTE — ED Notes (Signed)
Patient given sandwich and soda. 

## 2019-03-01 NOTE — ED Provider Notes (Signed)
Patient care assumed at 1500.  Pt with hx/o sickle cell anemia here with pain similar to prior pain crises.  Hgb improved compared to priors.  CXR clear.  Patient with persistent pain following initial treatment with dilaudid.  Will provide additional meds and recheck.    Recheck at [employer] patient increasingly somnolent, arouses to painful stimuli, complains to pain to bilateral legs.  Pt did not receive any doses of dilaudid, only naproxen.  Narcotics canceled due to patient's somnolence.  Pupils are midsized and reactive.  Will check additional labs.    Recheck at 2100.  Pt arouses more easily, denies current complaints.  Discussed positive UDS.  Pt does not comment on recent cocaine use, goes back to sleep.    Recheck at 2130. Patient is able to ambulate without difficulty, eating a third sandwich. Discussed with patient cocaine use as well as sickle cell pain crisis. Discussed recommendation for avoiding drugs as this may precipitated pain crisis. Discussed outpatient follow-up and return precautions.   Quintella Reichert, MD 03/01/19 2144

## 2019-03-01 NOTE — ED Notes (Signed)
Patient given sandwich.  

## 2019-03-01 NOTE — ED Notes (Signed)
Patient given water

## 2019-03-01 NOTE — ED Notes (Signed)
ED Provider at bedside. 

## 2019-03-01 NOTE — ED Triage Notes (Signed)
Pt c/o sickle cell pain in bilateral lower extremities. Pt states it began last night and she has taken a tylenol pm this morning. Pt appears drowsy.

## 2019-03-01 NOTE — ED Notes (Signed)
Patient alert and ambulatory without difficulty at this time. Given another sandwich.

## 2019-03-09 NOTE — ED Provider Notes (Signed)
[facility] DEPT Provider Note   CSN: [id number] Arrival date & time: 03/01/19  1319    History   Chief Complaint Chief Complaint  Patient presents with  . Leg Pain  . Sickle Cell Pain Crisis    HPI [name] is a 23 y.o. female.     HPI  23 year old female comes in a chief complaint of leg pain.  Patient has history of sickle cell anemia.  She reports that her current sickle cell pain started 1 day ago and has progressed.  The pain is similar to her sickle cell pain and is located in the legs, which is a typical location for her to have pain crisis.  She denies any chest pain, cough, fevers, nausea, vomiting, abdominal pain, UTI-like symptoms.  Patient is unsure why she had the pain crisis, suspects that it could be related to change in weather.  Past Medical History:  Diagnosis Date  . Acute kidney injury ([place]) 05/15/2016  . Amenorrhea   . Anemia    SICKLE CELL  . Asthma   . Drug-induced pruritus 02/26/2015  . GERD (gastroesophageal reflux disease)   . Headache   . Hyperbilirubinemia 02/26/2015  . Sickle cell disease [facility])     Patient Active Problem List   Diagnosis Date Noted  . Cocaine-induced mood disorder ([place])   . Generalized anxiety disorder   . Cocaine abuse ([place])   . Elevated liver enzymes 05/17/2018  . Sickle cell anemia ([place]) 05/16/2018  . Sickle cell crisis ([place]) 05/16/2018  . Sickle cell anemia with crisis ([place]) 12/16/2017  . Substance abuse ([place]) 12/16/2017  . Homelessness 02/12/2017  . Protein-calorie malnutrition, severe 10/21/2016  . Anemia of chronic disease   . Hb-SS disease without crisis ([place])   . Hereditary hemolytic anemia ([place])   . 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 Tobacco abuse   . Leukocytosis   . Sickle cell pain crisis ([place]) 02/15/2013  . Abdominal pain 07/25/2012  . Asthma, mild intermittent 12/14/2010    Past Surgical History:  Procedure Laterality Date   . 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 TONSILLECTOMY     Age 38     OB History   No obstetric history on file.      Home Medications    Prior to Admission medications   Medication Sig Start Date End Date Taking? Authorizing Provider  alum & mag hydroxide-simeth (MAALOX/MYLANTA) 200-200-20 MG/5ML suspension Take 30 mLs by mouth every 4 (four) hours as needed for indigestion or heartburn. 10/24/18  Yes Aline August, MD  gabapentin (NEURONTIN) 100 MG capsule Take 1 capsule (100 mg total) by mouth 3 (three) times daily. 10/23/18  Yes Aline August, MD  ibuprofen (ADVIL,MOTRIN) 800 MG tablet Take 1 tablet (800 mg total) by mouth every 8 (eight) hours as needed. 10/22/18  Yes Dorena Dew, FNP  ondansetron (ZOFRAN) 4 MG tablet Take 1 tablet (4 mg total) by mouth every 4 (four) hours as needed for nausea. 10/23/18  Yes Aline August, MD  pantoprazole (PROTONIX) 40 MG tablet Take 1 tablet (40 mg total) by mouth daily. 10/24/18  Yes Aline August, MD    Family History Family History  Problem Relation Age of Onset  . Hypertension Paternal Grandfather   . Sickle cell trait Father   . Cancer Mother        Died in 02-03-2009    Social History Social History   Tobacco Use  .  Smoking status: Current Every Day Smoker    Packs/day: 0.50    Years: 10.00    Pack years: 5.00    Types: Cigarettes  . Smokeless tobacco: Never Used  Substance Use Topics  . Alcohol use: No    Alcohol/week: 6.0 standard drinks    Types: 6 Shots of liquor per week    Comment: Denies 06/12/2014  . Drug use: Yes    Types: Marijuana, Cocaine     Allergies   Patient has no known allergies.   Review of Systems Review of Systems  Constitutional: Positive for activity change. Negative for chills and fever.  Cardiovascular: Negative for chest pain.  Gastrointestinal: Negative for nausea and vomiting.  Musculoskeletal: Positive for arthralgias and myalgias.  All other systems reviewed and are  negative.    Physical Exam Updated Vital Signs BP 101/63   Pulse 67   Temp 99.2 F (37.3 C) (Rectal)   Resp 20   Wt 68 kg   LMP 02/10/2019 (Approximate)   SpO2 99%   BMI 25.75 kg/m   Physical Exam Vitals signs and nursing note reviewed.  Constitutional:      Appearance: She is well-developed.  HENT:     Head: Normocephalic and atraumatic.  Neck:     Musculoskeletal: Normal range of motion and neck supple.  Cardiovascular:     Rate and Rhythm: Normal rate.  Pulmonary:     Effort: Pulmonary effort is normal.  Abdominal:     General: Bowel sounds are normal.  Musculoskeletal:        General: No swelling, tenderness or deformity.  Skin:    General: Skin is warm and dry.  Neurological:     Mental Status: She is alert and oriented to person, place, and time.      ED Treatments / Results  Labs (all labs ordered are listed, but only abnormal results are displayed) Labs Reviewed  RETICULOCYTES - Abnormal; Notable for the following components:      Result Value   Retic Ct Pct 13.8 (*)    RBC. 2.59 (*)    Retic Count, Absolute 356.9 (*)    Immature Retic Fract 33.0 (*)    All other components within normal limits  COMPREHENSIVE METABOLIC PANEL - Abnormal; Notable for the following components:   Potassium 3.4 (*)    Glucose, Bld 101 (*)    BUN 5 (*)    Total Bilirubin 4.7 (*)    All other components within normal limits  CBC WITH DIFFERENTIAL/PLATELET - Abnormal; Notable for the following components:   WBC 13.2 (*)    RBC 2.59 (*)    Hemoglobin 9.4 (*)    HCT 26.1 (*)    MCV 100.8 (*)    MCH 36.3 (*)    RDW 18.3 (*)    Platelets 512 (*)    nRBC 1.5 (*)    Neutro Abs 9.2 (*)    All other components within normal limits  RAPID URINE DRUG SCREEN, HOSP PERFORMED - Abnormal; Notable for the following components:   Cocaine POSITIVE (*)    All other components within normal limits  ACETAMINOPHEN LEVEL - Abnormal; Notable for the following components:    Acetaminophen (Tylenol), Serum <10 (*)    All other components within normal limits  D-DIMER, QUANTITATIVE (NOT AT [facility]) - Abnormal; Notable for the following components:   D-Dimer, Quant 0.84 (*)    All other components within normal limits  ETHANOL  SALICYLATE LEVEL  I-STAT BETA HCG BLOOD, ED (  [facility], [facility], [facility] ONLY)    EKG None  Radiology No results found.  Procedures Procedures (including critical care time)  Medications Ordered in ED Medications  naproxen (NAPROSYN) tablet 375 mg (375 mg Oral Given 03/01/19 1437)  iohexol (OMNIPAQUE) 350 MG/ML injection 100 mL (100 mLs Intravenous Contrast Given 03/01/19 2007)     Initial Impression / Assessment and Plan / ED Course  I have reviewed the triage vital signs and the nursing notes.  Pertinent labs & imaging results that were available during my care of the patient were reviewed by me and considered in my medical decision making (see chart for details).        23 year old comes in a chief complaint of leg pain. Patient is having bilateral leg pain similar to her sickle cell pain crisis.  She has no fevers, chills and review of system is not consistent with any infectious etiology of the underlying process.  Patient sickle cell overall is well controlled.  I ordered subcu Dilaudid and Naprosyn.  Patient will be signed out to Dr. Ralene Bathe, who will discharge the patient if she is feeling better.  Labs are pending at this time.  Final Clinical Impressions(s) / ED Diagnoses   Final diagnoses:  Sickle cell anemia with crisis [facility])  Cocaine abuse [facility])    ED Discharge Orders    None       Varney Biles, MD 03/09/19 2320

## 2019-03-10 ENCOUNTER — Other Ambulatory Visit: Payer: Self-pay

## 2019-03-10 ENCOUNTER — Emergency Department (HOSPITAL_COMMUNITY)
Admission: EM | Admit: 2019-03-10 | Discharge: 2019-03-10 | Disposition: A | Discharge status: Home / Self Care | Payer: Medicaid Other | Attending: Emergency Medicine | Admitting: Emergency Medicine

## 2019-03-10 DIAGNOSIS — J45909 Unspecified asthma, uncomplicated: Secondary | ICD-10-CM | POA: Diagnosis not present

## 2019-03-10 DIAGNOSIS — F17210 Nicotine dependence, cigarettes, uncomplicated: Secondary | ICD-10-CM | POA: Diagnosis not present

## 2019-03-10 DIAGNOSIS — Z79899 Other long term (current) drug therapy: Secondary | ICD-10-CM | POA: Diagnosis not present

## 2019-03-10 DIAGNOSIS — R400 Somnolence: Secondary | ICD-10-CM | POA: Diagnosis not present

## 2019-03-10 DIAGNOSIS — D5700 Hb-SS disease with crisis, unspecified: Secondary | ICD-10-CM | POA: Insufficient documentation

## 2019-03-10 DIAGNOSIS — R52 Pain, unspecified: Secondary | ICD-10-CM | POA: Diagnosis present

## 2019-03-10 LAB — COMPREHENSIVE METABOLIC PANEL
ALT: 12 U/L (ref 0–44)
AST: 25 U/L (ref 15–41)
Albumin: 4.8 g/dL (ref 3.5–5.0)
Alkaline Phosphatase: 79 U/L (ref 38–126)
Anion gap: 10 (ref 5–15)
BUN: 11 mg/dL (ref 6–20)
CO2: 24 mmol/L (ref 22–32)
Calcium: 9.4 mg/dL (ref 8.9–10.3)
Chloride: 106 mmol/L (ref 98–111)
Creatinine, Ser: 1.01 mg/dL — ABNORMAL HIGH (ref 0.44–1.00)
GFR calc Af Amer: >60 mL/min (ref >60)
GFR calc non Af Amer: >60 mL/min (ref >60)
Glucose, Bld: 109 mg/dL — ABNORMAL HIGH (ref 70–99)
Potassium: 3.6 mmol/L (ref 3.5–5.1)
Sodium: 140 mmol/L (ref 135–145)
Total Bilirubin: 5.2 mg/dL — ABNORMAL HIGH (ref 0.3–1.2)
Total Protein: 8.5 g/dL — ABNORMAL HIGH (ref 6.5–8.1)

## 2019-03-10 LAB — CBC WITH DIFFERENTIAL/PLATELET
Abs Immature Granulocytes: 0.05 10*3/uL (ref 0.00–0.07)
Basophils Absolute: 0 10*3/uL (ref 0.0–0.1)
Basophils Relative: 0 %
Eosinophils Absolute: 0 10*3/uL (ref 0.0–0.5)
Eosinophils Relative: 1 %
HCT: 28 % — ABNORMAL LOW (ref 36.0–46.0)
Hemoglobin: 9.5 g/dL — ABNORMAL LOW (ref 12.0–15.0)
Immature Granulocytes: 0 %
Lymphocytes Relative: 33 %
Lymphs Abs: 5 10*3/uL — ABNORMAL HIGH (ref 0.7–4.0)
MCH: 36.5 pg — ABNORMAL HIGH (ref 26.0–34.0)
MCHC: 36.5 g/dL — ABNORMAL HIGH (ref 30.0–36.0)
MCV: 100 fL (ref 80.0–100.0)
Monocytes Absolute: 1 10*3/uL (ref 0.1–1.0)
Monocytes Relative: 8 %
Neutro Abs: 8.5 10*3/uL — ABNORMAL HIGH (ref 1.7–7.7)
Neutrophils Relative %: 58 %
Platelets: 531 10*3/uL — ABNORMAL HIGH (ref 150–400)
RBC: 2.8 MIL/uL — ABNORMAL LOW (ref 3.87–5.11)
RDW: 17.8 % — ABNORMAL HIGH (ref 11.5–15.5)
WBC: 13.6 10*3/uL — ABNORMAL HIGH (ref 4.0–10.5)
nRBC: 1.2 % — ABNORMAL HIGH (ref 0.0–0.2)

## 2019-03-10 LAB — RETICULOCYTES
Immature Retic Fract: 22.4 % — ABNORMAL HIGH (ref 2.3–15.9)
RBC.: 2.8 MIL/uL — ABNORMAL LOW (ref 3.87–5.11)
Retic Count, Absolute: 416 10*3/uL — ABNORMAL HIGH (ref 19.0–186.0)
Retic Ct Pct: 14.9 % — ABNORMAL HIGH (ref 0.4–3.1)

## 2019-03-10 LAB — I-STAT BETA HCG BLOOD, ED (MC, WL, AP ONLY): I-stat hCG, quantitative: <5 m[IU]/mL (ref <5)

## 2019-03-10 MED ORDER — KETOROLAC TROMETHAMINE 15 MG/ML IJ SOLN
15.0000 mg | INTRAMUSCULAR | Status: AC
  Administered 2019-03-10: 15 mg via INTRAVENOUS
  Filled 2019-03-10: qty 1

## 2019-03-10 NOTE — ED Triage Notes (Addendum)
Pt BIBA  home c/o SCC.  Pt c/o BL legs and arms pain.  Denies any other complaints.   Pt falling asleep during triage.

## 2019-03-10 NOTE — ED Notes (Signed)
Bed: TD97 Expected date:  Expected time:  Means of arrival:  Comments: EMS [facility]

## 2019-03-10 NOTE — ED Notes (Signed)
Discharge paperwork reviewed with pt, pt verbalized understanding.  Pt will be escorted to ED entrance via wheelchair.

## 2019-03-10 NOTE — ED Notes (Signed)
Pt continuing to request pain medications.  Pt also continuing to fall asleep intermittently while this RN is in the room. Pt easily awoken, will fall back asleep during interaction with this RN.  MD Regenia Skeeter made aware.

## 2019-03-10 NOTE — ED Notes (Signed)
ED Provider at bedside. 

## 2019-03-10 NOTE — ED Provider Notes (Addendum)
[facility] DEPT Provider Note   CSN: [id number] Arrival date & time: 03/10/19  0847    History   Chief Complaint Chief Complaint  Patient presents with  . Sickle Cell Pain Crisis    HPI [name] is a 23 y.o. female.     HPI  23 year old female presents with sickle cell pain crisis. It started yesterday.  She reports pain in her neck as well as bilateral anterior thighs.  This is a recurrent problem and similar to many prior episodes.  No fevers or cough.  No chest pain or shortness of breath.  No vomiting.  She denies taking anything for pain.  No other complaints.  Past Medical History:  Diagnosis Date  . Acute kidney injury ([place]) 05/15/2016  . Amenorrhea   . Anemia    SICKLE CELL  . Asthma   . Drug-induced pruritus 02/26/2015  . GERD (gastroesophageal reflux disease)   . Headache   . Hyperbilirubinemia 02/26/2015  . Sickle cell disease [facility])     Patient Active Problem List   Diagnosis Date Noted  . Cocaine-induced mood disorder ([place])   . Generalized anxiety disorder   . Cocaine abuse ([place])   . Elevated liver enzymes 05/17/2018  . Sickle cell anemia ([place]) 05/16/2018  . Sickle cell crisis ([place]) 05/16/2018  . Sickle cell anemia with crisis ([place]) 12/16/2017  . Substance abuse ([place]) 12/16/2017  . Homelessness 02/12/2017  . Protein-calorie malnutrition, severe 10/21/2016  . Anemia of chronic disease   . Hb-SS disease without crisis ([place])   . Hereditary hemolytic anemia ([place])   . 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 Tobacco abuse   . Leukocytosis   . Sickle cell pain crisis ([place]) 02/15/2013  . Abdominal pain 07/25/2012  . Asthma, mild intermittent 12/14/2010    Past Surgical History:  Procedure Laterality Date  . SPLENECTOMY     Age 31 for sequestration  . TONSILLECTOMY     Age 5     OB History   No obstetric history on file.      Home Medications    Prior to Admission  medications   Medication Sig Start Date End Date Taking? Authorizing Provider  alum & mag hydroxide-simeth (MAALOX/MYLANTA) 200-200-20 MG/5ML suspension Take 30 mLs by mouth every 4 (four) hours as needed for indigestion or heartburn. 10/24/18  Yes Aline August, MD  gabapentin (NEURONTIN) 100 MG capsule Take 1 capsule (100 mg total) by mouth 3 (three) times daily. 10/23/18  Yes Aline August, MD  ibuprofen (ADVIL,MOTRIN) 800 MG tablet Take 1 tablet (800 mg total) by mouth every 8 (eight) hours as needed. 10/22/18  Yes Dorena Dew, FNP  ondansetron (ZOFRAN) 4 MG tablet Take 1 tablet (4 mg total) by mouth every 4 (four) hours as needed for nausea. 10/23/18  Yes Aline August, MD  pantoprazole (PROTONIX) 40 MG tablet Take 1 tablet (40 mg total) by mouth daily. 10/24/18  Yes Aline August, MD    Family History Family History  Problem Relation Age of Onset  . Hypertension Paternal Grandfather   . Sickle cell trait Father   . Cancer Mother        Died in 01/27/09    Social History Social History   Tobacco Use  . Smoking status: Current Every Day Smoker    Packs/day: 0.50    Years: 10.00    Pack years: 5.00    Types: Cigarettes  . Smokeless tobacco: Never Used  Substance Use  Topics  . Alcohol use: No    Alcohol/week: 6.0 standard drinks    Types: 6 Shots of liquor per week    Comment: Denies 06/12/2014  . Drug use: Yes    Types: Marijuana, Cocaine     Allergies   Patient has no known allergies.   Review of Systems Review of Systems  Constitutional: Negative for fever.  Respiratory: Negative for cough and shortness of breath.   Cardiovascular: Negative for chest pain.  Gastrointestinal: Negative for vomiting.  Musculoskeletal: Positive for myalgias and neck pain.  All other systems reviewed and are negative.    Physical Exam Updated Vital Signs BP 107/64   Pulse 83   Temp 98.1 F (36.7 C)   Resp 19   Ht 5\' 4"  (1.626 m)   Wt 63.5 kg   LMP 02/10/2019  (Approximate)   SpO2 97%   BMI 24.03 kg/m   Physical Exam Vitals signs and nursing note reviewed.  Constitutional:      General: She is not in acute distress.    Appearance: She is well-developed. She is not ill-appearing or diaphoretic.     Comments: Asleep Wakes up to calling her name, though is somnolent and easily falls asleep  HENT:     Head: Normocephalic and atraumatic.     Right Ear: External ear normal.     Left Ear: External ear normal.     Nose: Nose normal.  Eyes:     General:        Right eye: No discharge.        Left eye: No discharge.  Neck:     Musculoskeletal: Normal range of motion. Muscular tenderness (bilateral, trapezius) present. No neck rigidity.  Cardiovascular:     Rate and Rhythm: Normal rate and regular rhythm.     Heart sounds: Normal heart sounds.  Pulmonary:     Effort: Pulmonary effort is normal.     Breath sounds: Normal breath sounds.  Abdominal:     General: There is no distension.     Palpations: Abdomen is soft.     Tenderness: There is no abdominal tenderness.  Musculoskeletal:     Comments: Mild tenderness diffusely throughout bilateral anterior thighs  Skin:    General: Skin is warm and dry.  Psychiatric:        Mood and Affect: Mood is not anxious.      ED Treatments / Results  Labs (all labs ordered are listed, but only abnormal results are displayed) Labs Reviewed  CBC WITH DIFFERENTIAL/PLATELET - Abnormal; Notable for the following components:      Result Value   WBC 13.6 (*)    RBC 2.80 (*)    Hemoglobin 9.5 (*)    HCT 28.0 (*)    MCH 36.5 (*)    MCHC 36.5 (*)    RDW 17.8 (*)    Platelets 531 (*)    nRBC 1.2 (*)    Neutro Abs 8.5 (*)    Lymphs Abs 5.0 (*)    All other components within normal limits  COMPREHENSIVE METABOLIC PANEL - Abnormal; Notable for the following components:   Glucose, Bld 109 (*)    Creatinine, Ser 1.01 (*)    Total Protein 8.5 (*)    Total Bilirubin 5.2 (*)    All other components  within normal limits  RETICULOCYTES - Abnormal; Notable for the following components:   Retic Ct Pct 14.9 (*)    RBC. 2.80 (*)    Retic Count, Absolute  416.0 (*)    Immature Retic Fract 22.4 (*)    All other components within normal limits  URINALYSIS, ROUTINE W REFLEX MICROSCOPIC  I-STAT BETA HCG BLOOD, ED ([facility], [facility], [facility] ONLY)    EKG None  Radiology No results found.  Procedures Procedures (including critical care time)  Medications Ordered in ED Medications  ketorolac (TORADOL) 15 MG/ML injection 15 mg (15 mg Intravenous Given 03/10/19 0927)     Initial Impression / Assessment and Plan / ED Course  I have reviewed the triage vital signs and the nursing notes.  Pertinent labs & imaging results that were available during my care of the patient were reviewed by me and considered in my medical decision making (see chart for details).        Patient's labs are near her typical baseline including elevated WBC.  Hemoglobin stable.  Other labs are reassuring.  She denies any urinary symptoms set up we have to get a urine, especially with no infectious type symptoms such as fever or vomiting or abdominal pain.  She otherwise has been falling asleep quite quickly here though she is not altered when she awakens.  I do not think narcotics are indicated given this. Somnolence appears similar to when she was here on 5/2. I doubt acute CNS pathology, likely drug related.  She reports some improvement with the Toradol and at this point with stable vitals, she appears stable for discharge home with return precautions.  Final Clinical Impressions(s) / ED Diagnoses   Final diagnoses:  Sickle cell pain crisis [facility])    ED Discharge Orders    None       Sherwood Gambler, MD 03/10/19 1043    Sherwood Gambler, MD 03/10/19 1043

## 2019-03-26 ENCOUNTER — Inpatient Hospital Stay (HOSPITAL_COMMUNITY)
Admission: EM | Admit: 2019-03-26 | Discharge: 2019-03-28 | DRG: 812 | Disposition: A | Discharge status: Home / Self Care | Payer: Medicaid Other | Attending: Internal Medicine | Admitting: Internal Medicine

## 2019-03-26 ENCOUNTER — Other Ambulatory Visit: Payer: Self-pay

## 2019-03-26 DIAGNOSIS — F1290 Cannabis use, unspecified, uncomplicated: Secondary | ICD-10-CM | POA: Diagnosis present

## 2019-03-26 DIAGNOSIS — R748 Abnormal levels of other serum enzymes: Secondary | ICD-10-CM | POA: Diagnosis present

## 2019-03-26 DIAGNOSIS — F1910 Other psychoactive substance abuse, uncomplicated: Secondary | ICD-10-CM | POA: Diagnosis present

## 2019-03-26 DIAGNOSIS — Z20828 Contact with and (suspected) exposure to other viral communicable diseases: Secondary | ICD-10-CM | POA: Diagnosis present

## 2019-03-26 DIAGNOSIS — Z8249 Family history of ischemic heart disease and other diseases of the circulatory system: Secondary | ICD-10-CM | POA: Diagnosis not present

## 2019-03-26 DIAGNOSIS — J4520 Mild intermittent asthma, uncomplicated: Secondary | ICD-10-CM | POA: Diagnosis present

## 2019-03-26 DIAGNOSIS — Z832 Family history of diseases of the blood and blood-forming organs and certain disorders involving the immune mechanism: Secondary | ICD-10-CM

## 2019-03-26 DIAGNOSIS — Z79899 Other long term (current) drug therapy: Secondary | ICD-10-CM

## 2019-03-26 DIAGNOSIS — K219 Gastro-esophageal reflux disease without esophagitis: Secondary | ICD-10-CM | POA: Diagnosis present

## 2019-03-26 DIAGNOSIS — D5700 Hb-SS disease with crisis, unspecified: Principal | ICD-10-CM | POA: Diagnosis present

## 2019-03-26 DIAGNOSIS — D72829 Elevated white blood cell count, unspecified: Secondary | ICD-10-CM | POA: Diagnosis not present

## 2019-03-26 DIAGNOSIS — D72823 Leukemoid reaction: Secondary | ICD-10-CM

## 2019-03-26 DIAGNOSIS — G894 Chronic pain syndrome: Secondary | ICD-10-CM | POA: Diagnosis present

## 2019-03-26 DIAGNOSIS — Z9081 Acquired absence of spleen: Secondary | ICD-10-CM

## 2019-03-26 DIAGNOSIS — D638 Anemia in other chronic diseases classified elsewhere: Secondary | ICD-10-CM | POA: Diagnosis present

## 2019-03-26 DIAGNOSIS — Z720 Tobacco use: Secondary | ICD-10-CM

## 2019-03-26 DIAGNOSIS — F1410 Cocaine abuse, uncomplicated: Secondary | ICD-10-CM | POA: Diagnosis present

## 2019-03-26 DIAGNOSIS — F17210 Nicotine dependence, cigarettes, uncomplicated: Secondary | ICD-10-CM | POA: Diagnosis present

## 2019-03-26 DIAGNOSIS — F17200 Nicotine dependence, unspecified, uncomplicated: Secondary | ICD-10-CM | POA: Diagnosis present

## 2019-03-26 DIAGNOSIS — F411 Generalized anxiety disorder: Secondary | ICD-10-CM | POA: Diagnosis present

## 2019-03-26 DIAGNOSIS — Z590 Homelessness: Secondary | ICD-10-CM | POA: Diagnosis not present

## 2019-03-26 LAB — RAPID URINE DRUG SCREEN, HOSP PERFORMED
Amphetamines: NOT DETECTED
Barbiturates: NOT DETECTED
Benzodiazepines: NOT DETECTED
Cocaine: NOT DETECTED
Opiates: NOT DETECTED
Tetrahydrocannabinol: NOT DETECTED

## 2019-03-26 LAB — COMPREHENSIVE METABOLIC PANEL
ALT: 67 U/L — ABNORMAL HIGH (ref 0–44)
AST: 52 U/L — ABNORMAL HIGH (ref 15–41)
Albumin: 3.9 g/dL (ref 3.5–5.0)
Alkaline Phosphatase: 85 U/L (ref 38–126)
Anion gap: 6 (ref 5–15)
BUN: 10 mg/dL (ref 6–20)
CO2: 26 mmol/L (ref 22–32)
Calcium: 9 mg/dL (ref 8.9–10.3)
Chloride: 108 mmol/L (ref 98–111)
Creatinine, Ser: 0.74 mg/dL (ref 0.44–1.00)
GFR calc Af Amer: >60 mL/min (ref >60)
GFR calc non Af Amer: >60 mL/min (ref >60)
Glucose, Bld: 100 mg/dL — ABNORMAL HIGH (ref 70–99)
Potassium: 4.2 mmol/L (ref 3.5–5.1)
Sodium: 140 mmol/L (ref 135–145)
Total Bilirubin: 2.4 mg/dL — ABNORMAL HIGH (ref 0.3–1.2)
Total Protein: 6.9 g/dL (ref 6.5–8.1)

## 2019-03-26 LAB — CBC WITH DIFFERENTIAL/PLATELET
Abs Immature Granulocytes: 0.1 10*3/uL — ABNORMAL HIGH (ref 0.00–0.07)
Basophils Absolute: 0.1 10*3/uL (ref 0.0–0.1)
Basophils Relative: 0 %
Eosinophils Absolute: 0.1 10*3/uL (ref 0.0–0.5)
Eosinophils Relative: 1 %
HCT: 23.9 % — ABNORMAL LOW (ref 36.0–46.0)
Hemoglobin: 8.4 g/dL — ABNORMAL LOW (ref 12.0–15.0)
Immature Granulocytes: 1 %
Lymphocytes Relative: 52 %
Lymphs Abs: 6.6 10*3/uL — ABNORMAL HIGH (ref 0.7–4.0)
MCH: 36.7 pg — ABNORMAL HIGH (ref 26.0–34.0)
MCHC: 35.1 g/dL (ref 30.0–36.0)
MCV: 104.4 fL — ABNORMAL HIGH (ref 80.0–100.0)
Monocytes Absolute: 1 10*3/uL (ref 0.1–1.0)
Monocytes Relative: 8 %
Neutro Abs: 4.9 10*3/uL (ref 1.7–7.7)
Neutrophils Relative %: 38 %
Platelets: 500 10*3/uL — ABNORMAL HIGH (ref 150–400)
RBC: 2.29 MIL/uL — ABNORMAL LOW (ref 3.87–5.11)
RDW: 20.3 % — ABNORMAL HIGH (ref 11.5–15.5)
WBC: 12.7 10*3/uL — ABNORMAL HIGH (ref 4.0–10.5)
nRBC: 4.9 % — ABNORMAL HIGH (ref 0.0–0.2)

## 2019-03-26 LAB — SARS CORONAVIRUS 2 BY RT PCR (HOSPITAL ORDER, PERFORMED IN ~~LOC~~ HOSPITAL LAB): SARS Coronavirus 2: NEGATIVE

## 2019-03-26 LAB — I-STAT BETA HCG BLOOD, ED (MC, WL, AP ONLY): I-stat hCG, quantitative: <5 m[IU]/mL (ref <5)

## 2019-03-26 LAB — RETICULOCYTES
Immature Retic Fract: 35.1 % — ABNORMAL HIGH (ref 2.3–15.9)
RBC.: 2.29 MIL/uL — ABNORMAL LOW (ref 3.87–5.11)
Retic Count, Absolute: 543.5 10*3/uL — ABNORMAL HIGH (ref 19.0–186.0)
Retic Ct Pct: 20.9 % — ABNORMAL HIGH (ref 0.4–3.1)

## 2019-03-26 MED ORDER — POLYETHYLENE GLYCOL 3350 17 G PO PACK: 17.0000 g | PACK | Freq: Every day | ORAL | Status: DC | PRN

## 2019-03-26 MED ORDER — ONDANSETRON HCL 4 MG/2ML IJ SOLN
4.0000 mg | Freq: Four times a day (QID) | INTRAMUSCULAR | Status: DC | PRN
  Filled 2019-03-26 (×2): qty 2

## 2019-03-26 MED ORDER — ONDANSETRON HCL 4 MG PO TABS
4.0000 mg | ORAL_TABLET | ORAL | Status: DC | PRN
  Administered 2019-03-27 (×2): 4 mg via ORAL
  Filled 2019-03-26 (×2): qty 1

## 2019-03-26 MED ORDER — DIPHENHYDRAMINE HCL 12.5 MG/5ML PO ELIX
12.5000 mg | ORAL_SOLUTION | Freq: Four times a day (QID) | ORAL | Status: DC | PRN
  Filled 2019-03-26 (×2): qty 5

## 2019-03-26 MED ORDER — BISACODYL 10 MG RE SUPP: 10.0000 mg | Freq: Every day | RECTAL | Status: DC | PRN

## 2019-03-26 MED ORDER — ENOXAPARIN SODIUM 40 MG/0.4ML ~~LOC~~ SOLN
40.0000 mg | Freq: Every day | SUBCUTANEOUS | Status: DC
  Filled 2019-03-26 (×2): qty 0.4

## 2019-03-26 MED ORDER — SODIUM CHLORIDE 0.9 % IV BOLUS
500.0000 mL | Freq: Once | INTRAVENOUS | Status: AC
  Administered 2019-03-26: 22:00:00 500 mL via INTRAVENOUS

## 2019-03-26 MED ORDER — ONDANSETRON HCL 4 MG/2ML IJ SOLN
4.0000 mg | INTRAMUSCULAR | Status: DC | PRN
  Administered 2019-03-27 (×2): 4 mg via INTRAVENOUS

## 2019-03-26 MED ORDER — SENNOSIDES-DOCUSATE SODIUM 8.6-50 MG PO TABS
1.0000 | ORAL_TABLET | Freq: Two times a day (BID) | ORAL | Status: DC
  Administered 2019-03-27 – 2019-03-28 (×3): 1 via ORAL
  Filled 2019-03-26 (×3): qty 1

## 2019-03-26 MED ORDER — HYDROMORPHONE HCL 1 MG/ML IJ SOLN
1.0000 mg | Freq: Once | INTRAMUSCULAR | Status: AC
  Administered 2019-03-26: 20:00:00 1 mg via SUBCUTANEOUS
  Filled 2019-03-26: qty 1

## 2019-03-26 MED ORDER — DIPHENHYDRAMINE HCL 50 MG/ML IJ SOLN
12.5000 mg | Freq: Four times a day (QID) | INTRAMUSCULAR | Status: DC | PRN
  Administered 2019-03-27 – 2019-03-28 (×3): 12.5 mg via INTRAVENOUS
  Filled 2019-03-26 (×3): qty 1

## 2019-03-26 MED ORDER — ALBUTEROL SULFATE (2.5 MG/3ML) 0.083% IN NEBU: 2.5000 mg | INHALATION_SOLUTION | RESPIRATORY_TRACT | Status: DC | PRN

## 2019-03-26 MED ORDER — FOLIC ACID 1 MG PO TABS
1.0000 mg | ORAL_TABLET | Freq: Every day | ORAL | Status: DC
  Administered 2019-03-27 – 2019-03-28 (×2): 1 mg via ORAL
  Filled 2019-03-26 (×2): qty 1

## 2019-03-26 MED ORDER — DEXTROSE-NACL 5-0.45 % IV SOLN
INTRAVENOUS | Status: AC
  Administered 2019-03-26: via INTRAVENOUS

## 2019-03-26 MED ORDER — HYDROMORPHONE 1 MG/ML IV SOLN
INTRAVENOUS | Status: DC
  Administered 2019-03-27: 1.4 mg via INTRAVENOUS
  Administered 2019-03-27: 30 mg via INTRAVENOUS
  Administered 2019-03-27: 1.4 mg via INTRAVENOUS
  Administered 2019-03-28: 1.8 mg via INTRAVENOUS
  Filled 2019-03-26: qty 30

## 2019-03-26 MED ORDER — SODIUM CHLORIDE 0.9% FLUSH: 9.0000 mL | INTRAVENOUS | Status: DC | PRN

## 2019-03-26 MED ORDER — HYDROMORPHONE HCL 1 MG/ML IJ SOLN
1.0000 mg | Freq: Once | INTRAMUSCULAR | Status: AC
  Administered 2019-03-26: 21:00:00 1 mg via SUBCUTANEOUS
  Filled 2019-03-26: qty 1

## 2019-03-26 MED ORDER — GABAPENTIN 100 MG PO CAPS
100.0000 mg | ORAL_CAPSULE | Freq: Three times a day (TID) | ORAL | Status: DC
  Administered 2019-03-27 – 2019-03-28 (×5): 100 mg via ORAL
  Filled 2019-03-26 (×5): qty 1

## 2019-03-26 MED ORDER — NALOXONE HCL 0.4 MG/ML IJ SOLN: 0.4000 mg | INTRAMUSCULAR | Status: DC | PRN

## 2019-03-26 MED ORDER — HYDROXYZINE HCL 25 MG PO TABS
25.0000 mg | ORAL_TABLET | ORAL | Status: DC | PRN
  Administered 2019-03-27: 17:00:00 50 mg via ORAL
  Filled 2019-03-26: qty 2

## 2019-03-26 MED ORDER — KETOROLAC TROMETHAMINE 30 MG/ML IJ SOLN
30.0000 mg | Freq: Four times a day (QID) | INTRAMUSCULAR | Status: DC
  Administered 2019-03-27 – 2019-03-28 (×6): 30 mg via INTRAVENOUS
  Filled 2019-03-26 (×7): qty 1

## 2019-03-26 NOTE — ED Provider Notes (Signed)
[facility] DEPT Provider Note   CSN: [id number] Arrival date & time: 03/26/19  1943    History   Chief Complaint Chief Complaint  Patient presents with  . Sickle Cell Pain Crisis    HPI [name] is a 23 y.o. female with history of sickle cell anemia, polysubstance abuse, homelessness presents today for sickle cell pain crisis.  Patient reports that she had gradual onset of bilateral ankle and arm pain yesterday described as a severe throbbing/sharp pain constant worsened with movement and without alleviating factors.  Patient reports that she took home oxycodone without relief of her pain.  Patient denies abnormal features of her sickle cell pain.  She denies fever/chills, chest pain/shortness of breath, headache, vision changes, abdominal pain, nausea/vomiting, diarrhea or any additional concerns.  Patient arrived via EMS, triage note reports that patient was sleeping during transport and in no obvious distress.     HPI  Past Medical History:  Diagnosis Date  . Acute kidney injury ([place]) 05/15/2016  . Amenorrhea   . Anemia    SICKLE CELL  . Asthma   . Drug-induced pruritus 02/26/2015  . GERD (gastroesophageal reflux disease)   . Headache   . Hyperbilirubinemia 02/26/2015  . Sickle cell disease [facility])     Patient Active Problem List   Diagnosis Date Noted  . Cocaine-induced mood disorder ([place])   . Generalized anxiety disorder   . Cocaine abuse ([place])   . Elevated liver enzymes 05/17/2018  . Sickle cell anemia ([place]) 05/16/2018  . Sickle cell crisis ([place]) 05/16/2018  . Sickle cell anemia with crisis ([place]) 12/16/2017  . Substance abuse ([place]) 12/16/2017  . Homelessness 02/12/2017  . Protein-calorie malnutrition, severe 10/21/2016  . Anemia of chronic disease   . Hb-SS disease without crisis ([place])   . Hereditary hemolytic anemia ([place])   . 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 Tobacco abuse   .  Leukocytosis   . Sickle cell pain crisis ([place]) 02/15/2013  . Abdominal pain 07/25/2012  . Asthma, mild intermittent 12/14/2010    Past Surgical History:  Procedure Laterality Date  . 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 TONSILLECTOMY     Age 47     OB History   No obstetric history on file.      Home Medications    Prior to Admission medications   Medication Sig Start Date End Date Taking? Authorizing Provider  alum & mag hydroxide-simeth (MAALOX/MYLANTA) 200-200-20 MG/5ML suspension Take 30 mLs by mouth every 4 (four) hours as needed for indigestion or heartburn. 10/24/18  Yes Aline August, MD  gabapentin (NEURONTIN) 100 MG capsule Take 1 capsule (100 mg total) by mouth 3 (three) times daily. 10/23/18  Yes Aline August, MD  ibuprofen (ADVIL,MOTRIN) 800 MG tablet Take 1 tablet (800 mg total) by mouth every 8 (eight) hours as needed. Patient not taking: Reported on 03/26/2019 10/22/18   Dorena Dew, FNP  ondansetron (ZOFRAN) 4 MG tablet Take 1 tablet (4 mg total) by mouth every 4 (four) hours as needed for nausea. Patient not taking: Reported on 03/26/2019 10/23/18   Aline August, MD  pantoprazole (PROTONIX) 40 MG tablet Take 1 tablet (40 mg total) by mouth daily. Patient not taking: Reported on 03/26/2019 10/24/18   Aline August, MD    Family History Family History  Problem Relation Age of Onset  . Hypertension Paternal Grandfather   . Sickle cell trait Father   . Cancer  Mother        Died in 2010    Social History Social History   Tobacco Use  . Smoking status: Current Every Day Smoker    Packs/day: 0.50    Years: 10.00    Pack years: 5.00    Types: Cigarettes  . Smokeless tobacco: Never Used  Substance Use Topics  . Alcohol use: No    Alcohol/week: 6.0 standard drinks    Types: 6 Shots of liquor per week    Comment: Denies 06/12/2014  . Drug use: Yes    Types: Marijuana, Cocaine     Allergies   Patient has no known allergies.    Review of Systems Review of Systems  Constitutional: Negative.  Negative for chills and fever.  Eyes: Negative.  Negative for visual disturbance.  Respiratory: Negative.  Negative for cough and shortness of breath.   Cardiovascular: Negative.  Negative for chest pain.  Gastrointestinal: Negative.  Negative for abdominal pain, diarrhea, nausea and vomiting.  Genitourinary: Negative.  Negative for dysuria and hematuria.  Musculoskeletal: Positive for myalgias (Bilateral lower leg and arm pain consistent with prior sickle cell pain crises). Negative for back pain and neck pain.  Neurological: Negative.  Negative for weakness and headaches.  All other systems reviewed and are negative.  Physical Exam Updated Vital Signs BP 116/63   Pulse 68   Temp 98.2 F (36.8 C) (Oral)   Resp 20   Ht 5\' 4"  (1.626 m)   Wt 63.5 kg   SpO2 97%   BMI 24.03 kg/m   Physical Exam Constitutional:      General: She is not in acute distress.    Appearance: Normal appearance. She is well-developed. She is not ill-appearing or diaphoretic.  HENT:     Head: Normocephalic and atraumatic.     Right Ear: External ear normal.     Left Ear: External ear normal.     Nose: Nose normal.  Eyes:     General: Vision grossly intact. Gaze aligned appropriately.     Pupils: Pupils are equal, round, and reactive to light.  Neck:     Musculoskeletal: Normal range of motion.     Trachea: Trachea and phonation normal. No tracheal deviation.  Pulmonary:     Effort: Pulmonary effort is normal. No respiratory distress.  Abdominal:     General: There is no distension.     Palpations: Abdomen is soft.     Tenderness: There is no abdominal tenderness. There is no guarding or rebound.  Musculoskeletal: Normal range of motion.     Comments: No midline C/T/L spinal tenderness to palpation, no paraspinal muscle tenderness, no deformity, crepitus, or step-off noted. No sign of injury to the neck or back. - Hips stable to  compression bilaterally without pain, patient able to bring bilateral knees to chest without pain. - All major joints brought through range of motion without pain or crepitus.  No signs of injury.  Skin:    General: Skin is warm and dry.  Neurological:     Mental Status: She is alert.     GCS: GCS eye subscore is 4. GCS verbal subscore is 5. GCS motor subscore is 6.     Comments: Speech is clear and goal oriented, follows commands Major Cranial nerves without deficit, no facial droop Moves extremities without ataxia, coordination intact  Psychiatric:        Behavior: Behavior normal.    ED Treatments / Results  Labs (all labs ordered are listed,  but only abnormal results are displayed) Labs Reviewed  RETICULOCYTES - Abnormal; Notable for the following components:      Result Value   Retic Ct Pct 20.9 (*)    RBC. 2.29 (*)    Retic Count, Absolute 543.5 (*)    Immature Retic Fract 35.1 (*)    All other components within normal limits  CBC WITH DIFFERENTIAL/PLATELET - Abnormal; Notable for the following components:   WBC 12.7 (*)    RBC 2.29 (*)    Hemoglobin 8.4 (*)    HCT 23.9 (*)    MCV 104.4 (*)    MCH 36.7 (*)    RDW 20.3 (*)    Platelets 500 (*)    nRBC 4.9 (*)    Lymphs Abs 6.6 (*)    Abs Immature Granulocytes 0.10 (*)    All other components within normal limits  COMPREHENSIVE METABOLIC PANEL - Abnormal; Notable for the following components:   Glucose, Bld 100 (*)    AST 52 (*)    ALT 67 (*)    Total Bilirubin 2.4 (*)    All other components within normal limits  SARS CORONAVIRUS 2 (HOSPITAL ORDER, [facility])  RAPID URINE DRUG SCREEN, HOSP PERFORMED  HEPATITIS PANEL, ACUTE  ACETAMINOPHEN LEVEL  ETHANOL  MAGNESIUM  PHOSPHORUS  I-STAT BETA HCG BLOOD, ED ([facility], [facility], [facility] ONLY)    EKG None  Radiology No results found.  Procedures Procedures (including critical care time)  Medications Ordered in ED Medications  HYDROmorphone  (DILAUDID) injection 1 mg (1 mg Subcutaneous Given 03/26/19 2013)  HYDROmorphone (DILAUDID) injection 1 mg (1 mg Subcutaneous Given 03/26/19 2125)  sodium chloride 0.9 % bolus 500 mL (500 mLs Intravenous New Bag/Given 03/26/19 2140)     Initial Impression / Assessment and Plan / ED Course  I have reviewed the triage vital signs and the nursing notes.  Pertinent labs & imaging results that were available during my care of the patient were reviewed by me and considered in my medical decision making (see chart for details).  Clinical Course as of Mar 25 2230  Wed Mar 26, 2019  2220 Discussed with hopsitalist.   [BM]    Clinical Course User Index [BM] Deliah Boston, PA-C   Beta-hCG negative CMP with mildly elevated AST/ALT and elevated bilirubin CBC with hemoglobin 8.4 appears baseline Reticulocyte count with elevated reticulocyte count 543, increased from baseline Vital signs within normal limits COVID-19 test pending - Patient given first dose of subcutaneous Dilaudid, second is ordered. - Patient reassessed sitting comfortably no acute distress.  States no improvement of pain following second dose of subcutaneous Dilaudid is requesting stronger pain medication.  Advised that we will not give extremity and subcutaneous Dilaudid here in the ED, discussed admission with the patient and she wishes to be admitted to the hospital at this time for further pain control.  Consult placed to hospitalist.  Vital signs stable. - COVID-19 negative - Patient has been admitted to hospitalist service for further evaluation and management.   Note: Portions of this report may have been transcribed using voice recognition software. Every effort was made to ensure accuracy; however, inadvertent computerized transcription errors may still be present.  Final Clinical Impressions(s) / ED Diagnoses   Final diagnoses:  Sickle cell pain crisis [facility])    ED Discharge Orders    None       Gari Crown 03/26/19 2231    Julianne Rice, MD 03/26/19 2245

## 2019-03-26 NOTE — ED Triage Notes (Signed)
Per EMS- Pt coming from home with SCC starting yesterday. Prescriptions of oxycodone and morphine, has been self medicating with no relief. Pt A+Ox4, but sleeping during EMS ride. No obvious distress noted. Pt ambulatory with steady gait.   128/74 70HR 22R 98.4  99% RA CBG 114

## 2019-03-26 NOTE — H&P (Signed)
[name] ZOX:[id number] DOB: October 05, 1996 DOA: 03/26/2019     PCP: Dorena Dew, FNP   Outpatient Specialists:  NONE     Patient arrived to ER on 03/26/19 at 1943  Patient coming from: home Lives  With family    Chief Complaint:  Chief Complaint  Patient presents with  . Sickle Cell Pain Crisis    HPI: [name] is a 23 y.o. female with medical history significant of sickle cell anemia asthma GERD    Presented with states her sickle cell crisis started yesterday she has been using oxycodone and morphine self-medicating but did not seem to help her although she is sleepy on arrival to emergency department On EMS arrival heart rate 70 blood pressure 128/74 respirations 22 Reports gradual onset of bilateral ankle and arm pain since yesterday throbbing severe pain which is constant similar to prior she try to use oxycodone did not seem to help no fevers or chills no chest pain or shortness of breath associated there is no nausea or vomiting  reports she smokes Reports does not drink alcohol Reports last cocaine was last month Reports she only uses oxycodone as needed  oxycontin 15mg   and oxycodone 10mg   She run out Of both  She does not have regular doctor and has not more prescription for folic acid or hydroxyurea    Infectious risk factors:  Reports Muscle aches but typical for her sickle cell pain crisis In RAPID COVID TEST NEGATIVE     Regarding pertinent Chronic problems: Consult disease not hydroxyurea   While in ER: Noted to have elevated reticulocyte count up to 20.9 [place] at 8.4 Total bili 2.4 The following Work up has been ordered so far:  Orders Placed This Encounter  Procedures  . SARS Coronavirus 2 (CEPHEID - Performed in [place] hospital lab), [facility]  . Reticulocytes  . CBC with Differential  . Comprehensive metabolic panel  . Urine rapid drug screen (hosp performed)  . Acetaminophen level  . Ethanol  .  Hepatitis panel, acute  . Magnesium  . Phosphorus  . Consult to hospitalist  . I-Stat Beta hCG blood, ED ([facility], [facility], [facility] only)  . Admit to Inpatient (patient's expected length of stay will be greater than 2 midnights or inpatient only procedure)     Following Medications were ordered in ER: Medications  HYDROmorphone (DILAUDID) injection 1 mg (1 mg Subcutaneous Given 03/26/19 2013)  HYDROmorphone (DILAUDID) injection 1 mg (1 mg Subcutaneous Given 03/26/19 2125)  sodium chloride 0.9 % bolus 500 mL (500 mLs Intravenous New Bag/Given 03/26/19 2140)        Consult Orders  (From admission, onward)         Start     Ordered   03/26/19 2141  Consult to hospitalist  Once    Provider:  Toy Baker, MD  Question Answer Comment  Place call to: [facility]   Reason for Consult Admit      03/26/19 2140           Significant initial  Findings: Abnormal Labs Reviewed  RETICULOCYTES - Abnormal; Notable for the following components:      Result Value   Retic Ct Pct 20.9 (*)    RBC. 2.29 (*)    Retic Count, Absolute 543.5 (*)    Immature Retic Fract 35.1 (*)    All other components within normal limits  CBC WITH DIFFERENTIAL/PLATELET - Abnormal; Notable for the following components:   WBC 12.7 (*)  RBC 2.29 (*)    Hemoglobin 8.4 (*)    HCT 23.9 (*)    MCV 104.4 (*)    MCH 36.7 (*)    RDW 20.3 (*)    Platelets 500 (*)    nRBC 4.9 (*)    Lymphs Abs 6.6 (*)    Abs Immature Granulocytes 0.10 (*)    All other components within normal limits  COMPREHENSIVE METABOLIC PANEL - Abnormal; Notable for the following components:   Glucose, Bld 100 (*)    AST 52 (*)    ALT 67 (*)    Total Bilirubin 2.4 (*)    All other components within normal limits    Otherwise labs showing:    Recent Labs  Lab 03/26/19 2015  NA 140  K 4.2  CO2 26  GLUCOSE 100*  BUN 10  CREATININE 0.74  CALCIUM 9.0    Cr    stable,    Lab Results  Component Value Date   CREATININE 0.74  03/26/2019   CREATININE 1.01 (H) 03/10/2019   CREATININE 0.90 03/01/2019    Recent Labs  Lab 03/26/19 2015  AST 52*  ALT 67*  ALKPHOS 85  BILITOT 2.4*  PROT 6.9  ALBUMIN 3.9   Lab Results  Component Value Date   CALCIUM 9.0 03/26/2019   PHOS 3.1 04/19/2017      WBC      Component Value Date/Time   WBC 12.7 (H) 03/26/2019 2015   ANC    Component Value Date/Time   NEUTROABS 4.9 03/26/2019 2015   ALC No results found for: LYMPHOABS    Plt: Lab Results  Component Value Date   PLT 500 (H) 03/26/2019     Lactic Acid, Venous    Component Value Date/Time   LATICACIDVEN 1.26 02/12/2017 2003       Lab Results  Component Value Date   SARSCOV2NAA NEGATIVE 03/26/2019    HG/HCT  Stable     Component Value Date/Time   HGB 8.4 (L) 03/26/2019 2015   HCT 23.9 (L) 03/26/2019 2015   ECG: Not ordered     ED Triage Vitals  Enc Vitals Group     BP 03/26/19 1949 113/71     Pulse Rate 03/26/19 1949 67     Resp 03/26/19 1949 15     Temp 03/26/19 1949 98.2 F (36.8 C)     Temp Source 03/26/19 1949 Oral     SpO2 03/26/19 1949 95 %     Weight 03/26/19 2010 140 lb (63.5 kg)     Height 03/26/19 2010 5\' 4"  (1.626 m)     Head Circumference --      Peak Flow --      Pain Score 03/26/19 2009 8     Pain Loc --      Pain Edu? --      Excl. in [place]? --   TMAX(24)@       Latest  Blood pressure 116/63, pulse 68, temperature 98.2 F (36.8 C), temperature source Oral, resp. rate 20, height 5\' 4"  (1.626 m), weight 63.5 kg, SpO2 97 %.    Hospitalist was called for admission for sickle cell pain crisis   Review of Systems:    Pertinent positives include: Body aches diffuse  Constitutional:  No weight loss, night sweats, Fevers, chills, fatigue, weight loss  HEENT:  No headaches, Difficulty swallowing,Tooth/dental problems,Sore throat,  No sneezing, itching, ear ache, nasal congestion, post nasal drip,  Cardio-vascular:  No chest pain, Orthopnea, PND, anasarca,  dizziness, palpitations.no Bilateral lower extremity swelling  GI:  No heartburn, indigestion, abdominal pain, nausea, vomiting, diarrhea, change in bowel habits, loss of appetite, melena, blood in stool, hematemesis Resp:  no shortness of breath at rest. No dyspnea on exertion, No excess mucus, no productive cough, No non-productive cough, No coughing up of blood.No change in color of mucus.No wheezing. Skin:  no rash or lesions. No jaundice GU:  no dysuria, change in color of urine, no urgency or frequency. No straining to urinate.  No flank pain.  Musculoskeletal:    No decreased range of motion. No back pain.  Psych:  No change in mood or affect. No depression or anxiety. No memory loss.  Neuro: no localizing neurological complaints, no tingling, no weakness, no double vision, no gait abnormality, no slurred speech, no confusion  All systems reviewed and apart from [place] all are negative  Past Medical History:   Past Medical History:  Diagnosis Date  . Acute kidney injury ([place]) 05/15/2016  . Amenorrhea   . Anemia    SICKLE CELL  . Asthma   . Drug-induced pruritus 02/26/2015  . GERD (gastroesophageal reflux disease)   . Headache   . Hyperbilirubinemia 02/26/2015  . Sickle cell disease ([place])       Past Surgical History:  Procedure Laterality Date  . SPLENECTOMY     Age 59 for sequestration  . TONSILLECTOMY     Age 77    Social History:  Ambulatory  independently      reports that she has been smoking cigarettes. She has a 5.00 pack-year smoking history. She has never used smokeless tobacco. She reports current drug use. Drugs: Marijuana and Cocaine. She reports that she does not drink alcohol.     Family History:   Family History  Problem Relation Age of Onset  . Hypertension Paternal Grandfather   . Sickle cell trait Father   . Cancer Mother        Died in 20-Feb-2009    Allergies: No Known Allergies   Prior to Admission medications   Medication Sig Start Date  End Date Taking? Authorizing Provider  alum & mag hydroxide-simeth (MAALOX/MYLANTA) 200-200-20 MG/5ML suspension Take 30 mLs by mouth every 4 (four) hours as needed for indigestion or heartburn. 10/24/18  Yes Aline August, MD  gabapentin (NEURONTIN) 100 MG capsule Take 1 capsule (100 mg total) by mouth 3 (three) times daily. 10/23/18  Yes Aline August, MD  ibuprofen (ADVIL,MOTRIN) 800 MG tablet Take 1 tablet (800 mg total) by mouth every 8 (eight) hours as needed. Patient not taking: Reported on 03/26/2019 10/22/18   Dorena Dew, FNP  ondansetron (ZOFRAN) 4 MG tablet Take 1 tablet (4 mg total) by mouth every 4 (four) hours as needed for nausea. Patient not taking: Reported on 03/26/2019 10/23/18   Aline August, MD  pantoprazole (PROTONIX) 40 MG tablet Take 1 tablet (40 mg total) by mouth daily. Patient not taking: Reported on 03/26/2019 10/24/18   Aline August, MD   Physical Exam: Blood pressure 116/63, pulse 68, temperature 98.2 F (36.8 C), temperature source Oral, resp. rate 20, height 5\' 4"  (1.626 m), weight 63.5 kg, SpO2 97 %. 1. General:  in No  Acute distress   Chronically ill -appearing 2. Psychological: Alert and  Oriented 3. Head/ENT:     Dry Mucous Membranes                          Head Non traumatic, neck  supple                          Poor Dentition 4. SKIN: decreased Skin turgor,  Skin clean Dry and intact no rash 5. Heart: Regular rate and rhythm no Murmur, no Rub or gallop 6. Lungs:  Clear to auscultation bilaterally, no wheezes or crackles   7. Abdomen: Soft,  non-tender, Non distended  bowel sounds present 8. Lower extremities: no clubbing, cyanosis, no  edema 9. Neurologically Grossly intact, moving all 4 extremities equally  10. MSK: Normal range of motion   All other LABS:     Recent Labs  Lab 03/26/19 2015  WBC 12.7*  NEUTROABS 4.9  HGB 8.4*  HCT 23.9*  MCV 104.4*  PLT 500*     Recent Labs  Lab 03/26/19 2015  NA 140  K 4.2  CL 108   CO2 26  GLUCOSE 100*  BUN 10  CREATININE 0.74  CALCIUM 9.0     Recent Labs  Lab 03/26/19 2015  AST 52*  ALT 67*  ALKPHOS 85  BILITOT 2.4*  PROT 6.9  ALBUMIN 3.9       Cultures:    Component Value Date/Time   SDES BLOOD LEFT WRIST 12/21/2016 2030   SPECREQUEST IN PEDIATRIC BOTTLE 3CC 12/21/2016 2030   CULT NO GROWTH 5 DAYS 12/21/2016 2030   REPTSTATUS 12/26/2016 FINAL 12/21/2016 2030     Radiological Exams on Admission: No results found.  Chart has been reviewed    Assessment/Plan  23 y.o. female with medical history significant of sickle cell anemia asthma GERD Admitted for sickle cell  pain crisis  Present on Admission: . Sickle cell crisis ([place])- - will admit per sickle cell protocol,    control pain,    hydrate with IVF D5 .45% Saline @ 100 mls/hour,    Weight based Dilaudid PCA patient states she does not take oxycodone on a regular basis will order low-dose PCA Not on hydroxyurea and folic acid at baseline will need to have follow-up and establish care with sickle cell specialist   Transfuse as needed if Hg drops significantly below baseline.    No evidence of acute chest at this time   Sickle cell team to take over management in AM  . Asthma, mild intermittent mild continue albuterol as needed   . Tobacco abuse -she currently denies . Leukocytosis -in the setting of sickle cell crisis we will continue to monitor at this point no evidence of infectious process . Substance abuse ([place]) -reports last cocaine use was 1 month ago will check U tox . Elevated liver enzymes -will check acute hepatitis panel acetaminophen level alcohol level.  This is in the setting of sickle cell pain crisis continue to monitor and repeat as needed    Other plan as per orders.  DVT prophylaxis:   Lovenox     Code Status:  FULL CODE   as per patient   I had personally discussed CODE STATUS with patient   Family Communication:   Family not at  Bedside    Disposition  Plan:     To home once workup is complete and patient is stable                        Consults called: none  Admission status:  ED Disposition    ED Disposition Condition [place]: [place] [[id number]]  Level of Care: Med-Surg [16]  Covid Evaluation: N/A  Diagnosis: Sickle cell crisis [facility]) [[id number]]  Admitting Physician: Toy Baker [[id number]]  Attending Physician: Toy Baker [[id number]]  Estimated length of stay: 3 - 4 days  Certification:: I certify this patient will need inpatient services for at least 2 midnights  PT Class (Do Not Modify): Inpatient [101]  PT Acc Code (Do Not Modify): Private [1]          inpatient     Expect 2 midnight stay secondary to severity of patient's current illness including     Severe lab/radiological/exam abnormalities including: Anemia elevated reticulocyte count   and extensive comorbidities including:    sickle cell disease    That are currently affecting medical management.   I expect  patient to be hospitalized for 2 midnights requiring inpatient medical care.  Patient is at high risk for adverse outcome (such as loss of life or disability) if not treated.  Indication for inpatient stay as follows:    severe pain requiring acute inpatient management,     Need for , IV pain medications      Level of care     medical floor  Precautions:  NONE Droplet,  No active isolations  PPE: Used by the provider:   P100  eye Goggles,  Gloves      Ladale Sherburn 03/26/2019, 11:15 PM    [facility]     after 2 AM please page floor coverage PA If 7AM-7PM, please contact the day team taking care of the patient using [email or web address]

## 2019-03-26 NOTE — ED Notes (Signed)
Pt ambulated to the bathroom with no assistance. Gait steady  

## 2019-03-26 NOTE — ED Notes (Signed)
Admitting MD at bedside.

## 2019-03-27 ENCOUNTER — Other Ambulatory Visit: Payer: Self-pay

## 2019-03-27 DIAGNOSIS — R748 Abnormal levels of other serum enzymes: Secondary | ICD-10-CM

## 2019-03-27 DIAGNOSIS — D5700 Hb-SS disease with crisis, unspecified: Principal | ICD-10-CM

## 2019-03-27 DIAGNOSIS — D72829 Elevated white blood cell count, unspecified: Secondary | ICD-10-CM

## 2019-03-27 DIAGNOSIS — F1910 Other psychoactive substance abuse, uncomplicated: Secondary | ICD-10-CM

## 2019-03-27 DIAGNOSIS — J4520 Mild intermittent asthma, uncomplicated: Secondary | ICD-10-CM

## 2019-03-27 LAB — MAGNESIUM: Magnesium: 2.1 mg/dL (ref 1.7–2.4)

## 2019-03-27 LAB — PHOSPHORUS: Phosphorus: 5.3 mg/dL — ABNORMAL HIGH (ref 2.5–4.6)

## 2019-03-27 LAB — ACETAMINOPHEN LEVEL: Acetaminophen (Tylenol), Serum: <10 ug/mL — ABNORMAL LOW (ref 10–30)

## 2019-03-27 LAB — ETHANOL: Alcohol, Ethyl (B): <10 mg/dL (ref <10)

## 2019-03-27 NOTE — Progress Notes (Signed)
Subjective: [name], a 23 year old female with a medical history significant for sickle cell disease, chronic pain syndrome, anemia of chronic disease, and history of polysubstance abuse was admitted for sickle cell pain crisis. Patient states that pain has improved some overnight.  Current pain intensity is 7/10 primarily to bilateral ankles, and upper extremities. Patient's goal is 5/10, she states that she cannot manage at home.  She also does not have a PCP and has not been on home opiates or hydroxyurea over the past several months. Patient is mostly opiate nave She states that she is not had any cocaine or illicit drug use over the past month.  She has been residing with her aunt.  She denies fever, headache, shortness of breath, sore throat, persistent cough, dysuria, nausea, vomiting, or diarrhea.  Objective:  Vital signs in last 24 hours:  Vitals:   03/27/19 0318 03/27/19 0402 03/27/19 0749 03/27/19 0934  BP:  (!) 107/35  (!) 107/58  Pulse:  (!) 54  60  Resp: 15  18 14   Temp:    (!) 97.5 F (36.4 C)  TempSrc:    Oral  SpO2: 97% 100% 98% 99%  Weight:      Height:        Intake/Output from previous day:   Intake/Output Summary (Last 24 hours) at 03/27/2019 1145 Last data filed at 03/27/2019 0942 Gross per 24 hour  Intake 120 ml  Output -  Net 120 ml    Physical Exam: General: Alert, awake, oriented x3, in no acute distress.  HEENT: [place]/AT PEERL, EOMI Neck: Trachea midline,  no masses, no thyromegal,y no JVD, no carotid bruit OROPHARYNX:  Moist, No exudate/ erythema/lesions.  Heart: Regular rate and rhythm, without murmurs, rubs, gallops, PMI non-displaced, no heaves or thrills on palpation.  Lungs: Clear to auscultation, no wheezing or rhonchi noted. No increased vocal fremitus resonant to percussion  Abdomen: Soft, nontender, nondistended, positive bowel sounds, no masses no hepatosplenomegaly noted..  Neuro: No focal neurological deficits noted cranial  nerves II through XII grossly intact. DTRs 2+ bilaterally upper and lower extremities. Strength 5 out of 5 in bilateral upper and lower extremities. Musculoskeletal: No warm swelling or erythema around joints, no spinal tenderness noted. Psychiatric: Patient alert and oriented x3, good insight and cognition, good recent to remote recall. Lymph node survey: No cervical axillary or inguinal lymphadenopathy noted.  Lab Results:  Basic Metabolic Panel:    Component Value Date/Time   NA 140 03/26/2019 2015   K 4.2 03/26/2019 2015   CL 108 03/26/2019 2015   CO2 26 03/26/2019 2015   BUN 10 03/26/2019 2015   CREATININE 0.74 03/26/2019 2015   GLUCOSE 100 (H) 03/26/2019 2015   CALCIUM 9.0 03/26/2019 2015   CBC:    Component Value Date/Time   WBC 12.7 (H) 03/26/2019 2015   HGB 8.4 (L) 03/26/2019 2015   HCT 23.9 (L) 03/26/2019 2015   PLT 500 (H) 03/26/2019 2015   MCV 104.4 (H) 03/26/2019 2015   NEUTROABS 4.9 03/26/2019 2015   LYMPHSABS 6.6 (H) 03/26/2019 2015   MONOABS 1.0 03/26/2019 2015   EOSABS 0.1 03/26/2019 2015   BASOSABS 0.1 03/26/2019 2015    Recent Results (from the past 240 hour(s))  SARS Coronavirus 2 (CEPHEID - Performed in [place] hospital lab), Hosp Order     Status: None   Collection Time: 03/26/19  8:15 PM  Result Value Ref Range Status   SARS Coronavirus 2 NEGATIVE NEGATIVE Final    Comment: (NOTE)  If result is NEGATIVE SARS-CoV-2 target nucleic acids are NOT DETECTED. The SARS-CoV-2 RNA is generally detectable in upper and lower  respiratory specimens during the acute phase of infection. The lowest  concentration of SARS-CoV-2 viral copies this assay can detect is 250  copies / mL. A negative result does not preclude SARS-CoV-2 infection  and should not be used as the sole basis for treatment or other  patient management decisions.  A negative result may occur with  improper specimen collection / handling, submission of specimen other  than nasopharyngeal  swab, presence of viral mutation(s) within the  areas targeted by this assay, and inadequate number of viral copies  (<250 copies / mL). A negative result must be combined with clinical  observations, patient history, and epidemiological information. If result is POSITIVE SARS-CoV-2 target nucleic acids are DETECTED. The SARS-CoV-2 RNA is generally detectable in upper and lower  respiratory specimens dur ing the acute phase of infection.  Positive  results are indicative of active infection with SARS-CoV-2.  Clinical  correlation with patient history and other diagnostic information is  necessary to determine patient infection status.  Positive results do  not rule out bacterial infection or co-infection with other viruses. If result is PRESUMPTIVE POSTIVE SARS-CoV-2 nucleic acids MAY BE PRESENT.   A presumptive positive result was obtained on the submitted specimen  and confirmed on repeat testing.  While 2019 novel coronavirus  (SARS-CoV-2) nucleic acids may be present in the submitted sample  additional confirmatory testing may be necessary for epidemiological  and / or clinical management purposes  to differentiate between  SARS-CoV-2 and other Sarbecovirus currently known to infect humans.  If clinically indicated additional testing with an alternate test  methodology [phone]) is advised. The SARS-CoV-2 RNA is generally  detectable in upper and lower respiratory sp ecimens during the acute  phase of infection. The expected result is Negative. Fact Sheet for Patients:  StrictlyIdeas.no Fact Sheet for Healthcare Providers: BankingDealers.co.za This test is not yet approved or cleared by the [place] FDA and has been authorized for detection and/or diagnosis of SARS-CoV-2 by FDA under an Emergency Use Authorization (EUA).  This EUA will remain in effect (meaning this test can be used) for the duration of the COVID-19 declaration  under Section 564(b)(1) of the Act, 21 U.S.C. section 360bbb-3(b)(1), unless the authorization is terminated or revoked sooner. Performed at [facility], [place] [address] [place], [place] [address]     Studies/Results: No results found.  Medications: Scheduled Meds: . enoxaparin (LOVENOX) injection  40 mg Subcutaneous Daily  . folic acid  1 mg Oral Daily  . gabapentin  100 mg Oral TID  . HYDROmorphone   Intravenous Q4H  . ketorolac  30 mg Intravenous Q6H  . senna-docusate  1 tablet Oral BID   Continuous Infusions: . dextrose 5 % and 0.45% NaCl 100 mL/hr at 03/26/19 2351   PRN Meds:.albuterol, bisacodyl, diphenhydrAMINE **OR** diphenhydrAMINE, hydrOXYzine, naloxone **AND** sodium chloride flush, ondansetron (ZOFRAN) IV, ondansetron **OR** ondansetron (ZOFRAN) IV, polyethylene glycol  Assessment/Plan: Active Problems:   Asthma, mild intermittent   Sickle cell pain crisis (HCC)   Tobacco abuse   Leukocytosis   Substance abuse (HCC)   Sickle cell crisis (HCC)   Elevated liver enzymes  Sickle cell anemia with pain crisis: Continue to hydrate with IVF, D5 0.45 at 100 mL/h Patient opiate nave, continue low-dose PCA. Restart hydroxyurea and folic acid Maintain oxygen saturation above 90%  Sickle cell anemia:  Hemoglobin 8.4,  which is consistent with patient's baseline. No clinic indication for blood transfusion at this time. Repeat CBC in am  History of mild intermittent asthma: Stable. Continue albuterol as needed.   History of polysubstance abuse: UDS reviewed, no illicit drugs.  Elevated liver enzymes: Hepatitis panel pending.  Acetaminophen level negative. CMP in a.m., continue to monitor closely.  Most likely related to sickle cell pain crisis.  Code Status: Full Code Family Communication: N/A Disposition Plan: Not yet ready for discharge  [place], MSN, FNP-C Patient [employer] [address]  [place], [place] [address] [phone]  If 5PM-7AM, please contact night-coverage.  03/27/2019, 11:45 AM  LOS: 1 day

## 2019-03-28 DIAGNOSIS — Z720 Tobacco use: Secondary | ICD-10-CM

## 2019-03-28 LAB — HEPATITIS PANEL, ACUTE
HCV Ab: <0.1 s/co ratio (ref 0.0–0.9)
Hep A IgM: NEGATIVE
Hep B C IgM: NEGATIVE
Hepatitis B Surface Ag: NEGATIVE

## 2019-03-28 LAB — BASIC METABOLIC PANEL
Anion gap: 6 (ref 5–15)
BUN: 18 mg/dL (ref 6–20)
CO2: 25 mmol/L (ref 22–32)
Calcium: 8.6 mg/dL — ABNORMAL LOW (ref 8.9–10.3)
Chloride: 106 mmol/L (ref 98–111)
Creatinine, Ser: 0.84 mg/dL (ref 0.44–1.00)
GFR calc Af Amer: >60 mL/min (ref >60)
GFR calc non Af Amer: >60 mL/min (ref >60)
Glucose, Bld: 100 mg/dL — ABNORMAL HIGH (ref 70–99)
Potassium: 4.3 mmol/L (ref 3.5–5.1)
Sodium: 137 mmol/L (ref 135–145)

## 2019-03-28 LAB — CBC
HCT: 22.9 % — ABNORMAL LOW (ref 36.0–46.0)
Hemoglobin: 8.1 g/dL — ABNORMAL LOW (ref 12.0–15.0)
MCH: 37 pg — ABNORMAL HIGH (ref 26.0–34.0)
MCHC: 35.4 g/dL (ref 30.0–36.0)
MCV: 104.6 fL — ABNORMAL HIGH (ref 80.0–100.0)
Platelets: 485 10*3/uL — ABNORMAL HIGH (ref 150–400)
RBC: 2.19 MIL/uL — ABNORMAL LOW (ref 3.87–5.11)
RDW: 19.7 % — ABNORMAL HIGH (ref 11.5–15.5)
WBC: 13 10*3/uL — ABNORMAL HIGH (ref 4.0–10.5)
nRBC: 6.3 % — ABNORMAL HIGH (ref 0.0–0.2)

## 2019-03-28 MED ORDER — FOLIC ACID 1 MG PO TABS: 1.0000 mg | ORAL_TABLET | Freq: Every day | ORAL | 0 refills | Status: DC

## 2019-03-28 MED ORDER — DEXTROSE-NACL 5-0.45 % IV SOLN
INTRAVENOUS | Status: DC
  Administered 2019-03-28: 02:00:00 via INTRAVENOUS

## 2019-03-28 MED ORDER — OXYCODONE-ACETAMINOPHEN 5-325 MG PO TABS: 1.0000 | ORAL_TABLET | Freq: Four times a day (QID) | ORAL | 0 refills | Status: AC | PRN

## 2019-03-28 MED ORDER — IBUPROFEN 800 MG PO TABS: 800.0000 mg | ORAL_TABLET | Freq: Three times a day (TID) | ORAL | 0 refills | Status: DC | PRN

## 2019-03-28 MED ORDER — GABAPENTIN 100 MG PO CAPS: 100.0000 mg | ORAL_CAPSULE | Freq: Three times a day (TID) | ORAL | 0 refills | Status: DC

## 2019-03-28 MED ORDER — ONDANSETRON HCL 4 MG PO TABS: 4.0000 mg | ORAL_TABLET | Freq: Three times a day (TID) | ORAL | 0 refills | Status: DC | PRN

## 2019-03-28 MED FILL — OXYCODONE-ACETAMINOPHEN 5-3: 5-325 | 5 days supply | Qty: 20 | Fill #0

## 2019-03-28 NOTE — Discharge Summary (Addendum)
Physician Discharge Summary  [name] YTK:[id number] DOB: [birth year] DOA: 03/26/2019  PCP: Dorena Dew, FNP  Admit date: 03/26/2019  Discharge date: 03/28/2019  Discharge Diagnoses:  Active Problems:   Asthma, mild intermittent   Sickle cell pain crisis (HCC)   Tobacco abuse   Leukocytosis   Substance abuse (HCC)   Sickle cell crisis ([place])   Elevated liver enzymes   Discharge Condition: Stable  Disposition:  Follow-up Information    Dorena Dew, FNP Follow up in 2 week(s).   Specialty:  Family Medicine Why:  Repeat CBC and CMP during follow up Contact information: [place]. [place] [address] [phone]          Pt is discharged home in good condition and is to follow up with Dorena Dew, FNP in 2 weeks for medication management and to have labs evaluated. LELIANA Bradley is instructed to increase activity slowly and balance with rest for the next few days, and use prescribed medication to complete treatment of pain  Diet: Regular Wt Readings from Last 3 Encounters:  03/26/19 63.5 kg  03/10/19 63.5 kg  03/01/19 68 kg    History of present illness:  [name], a 23 year old female with a medical history significant for sickle cell disease, mild intermittent asthma, and history of polysubstance abuse Presented to ER complaining of bilateral ankle and arm pain patient attributed pain crisis to changes in weather.  Patient has been lost to follow-up with PCP.  She has a history of polysubstance abuse and last used cocaine 1 month ago.  Patient denies headache, chest pain, shortness of breath, nausea, vomiting, or diarrhea.  She denies recent travel, sick contacts, or exposure to COVID-19.  ER course: Hemoglobin 8.4, total bilirubin 2.4.  SARS negative, beta hCG negative.  Pain persists despite IV dilaudid, IV Toradol, and IV fluids. Patient admitted to med-surg for sickle cell pain crisis.  Hospital Course:   Sickle cell anemia with pain crisis:  Patient says that pain has improved and is requesting discharge.  Patient was admitted for sickle cell pain crisis and managed appropriately with IVF, IV Dilaudid via PCA and IV Toradol, as well as other adjunct therapies per sickle cell pain management protocols. Patient has been lost to follow up with PCP and has not had pain medications. She is opiate naive. She was treated with low dose PCA throughout admission. Patient will complete treatment with Percocet 5-325 #20 every 6 hours and needed for moderate to severe pain. PDMP reviewed prior to prescribing, no inconsistencies noted. Also, Ibuprofen 800 every 8 hours for mild to moderate pain.   Pain intensity 5/10. Patient alert, oriented, and ambulating without assistance. She is afebrile and oxygen saturation 98% on RA.   Patient was discharged home today in a hemodynamically stable condition.   Discharge Exam: Vitals:   03/28/19 0726 03/28/19 0955  BP:  (!) 83/43  Pulse:  64  Resp: 16 16  Temp:  97.9 F (36.6 C)  SpO2: 98% 98%   Vitals:   03/28/19 0400 03/28/19 0528 03/28/19 0726 03/28/19 0955  BP:  (!) 102/51  (!) 83/43  Pulse:  (!) 59  64  Resp: 17 17 16 16   Temp:  98.5 F (36.9 C)  97.9 F (36.6 C)  TempSrc:  Oral  Oral  SpO2: 100% 98% 98% 98%  Weight:      Height:       Physical Exam Constitutional:      Appearance:  Normal appearance.  HENT:     Head: Normocephalic.     Nose: Nose normal.     Mouth/Throat:     Mouth: Mucous membranes are moist.  Eyes:     Pupils: Pupils are equal, round, and reactive to light.  Cardiovascular:     Rate and Rhythm: Normal rate and regular rhythm.     Pulses: Normal pulses.     Heart sounds: Normal heart sounds.  Pulmonary:     Effort: Pulmonary effort is normal.     Breath sounds: Normal breath sounds.  Abdominal:     General: Abdomen is flat. Bowel sounds are normal.  Musculoskeletal: Normal range of motion.  Skin:    General: Skin  is warm and dry.  Neurological:     General: No focal deficit present.     Mental Status: She is alert. Mental status is at baseline.  Psychiatric:        Mood and Affect: Mood normal.        Behavior: Behavior normal.        Thought Content: Thought content normal.        Judgment: Judgment normal.     Discharge Instructions  Discharge Instructions    Discharge patient   Complete by:  As directed    Discharge disposition:  01-Home or Self Care   Discharge patient date:  03/28/2019     Allergies as of 03/28/2019   No Known Allergies     Medication List    STOP taking these medications   alum & mag hydroxide-simeth 200-200-20 MG/5ML suspension Commonly known as:  MAALOX/MYLANTA   pantoprazole 40 MG tablet Commonly known as:  PROTONIX     TAKE these medications   folic acid 1 MG tablet Commonly known as:  FOLVITE Take 1 tablet (1 mg total) by mouth daily. Start taking on:  Mar 29, 2019   gabapentin 100 MG capsule Commonly known as:  NEURONTIN Take 1 capsule (100 mg total) by mouth 3 (three) times daily.   ibuprofen 800 MG tablet Commonly known as:  ADVIL Take 1 tablet (800 mg total) by mouth every 8 (eight) hours as needed.   ondansetron 4 MG tablet Commonly known as:  ZOFRAN Take 1 tablet (4 mg total) by mouth every 8 (eight) hours as needed for nausea. What changed:  when to take this   oxyCODONE-acetaminophen 5-325 MG tablet Commonly known as:  Percocet Take 1 tablet by mouth every 6 (six) hours as needed for severe pain.       The results of significant diagnostics from this hospitalization (including imaging, microbiology, ancillary and laboratory) are listed below for reference.    Significant Diagnostic Studies: Ct Head Wo Contrast  Result Date: 03/01/2019 CLINICAL DATA:  Altered level of consciousness EXAM: CT HEAD WITHOUT CONTRAST TECHNIQUE: Contiguous axial images were obtained from the base of the skull through the vertex without intravenous  contrast. COMPARISON:  None. FINDINGS: Brain: No evidence of acute infarction, hemorrhage, hydrocephalus, extra-axial collection or mass lesion/mass effect. Vascular: No hyperdense vessel or unexpected calcification. Skull: No osseous abnormality. Sinuses/Orbits: Visualized paranasal sinuses are clear. Visualized mastoid sinuses are clear. Visualized orbits demonstrate no focal abnormality. Other: None IMPRESSION: No acute intracranial pathology. Electronically Signed   By: Kathreen Devoid   On: 03/01/2019 19:49   Ct Angio Chest Pe W/cm &/or Wo Cm  Result Date: 03/01/2019 CLINICAL DATA:  Dyspnea, elevated D-dimer, suspected pulmonary embolism EXAM: CT ANGIOGRAPHY CHEST WITH CONTRAST TECHNIQUE: Multidetector CT imaging of  the chest was performed using the standard protocol during bolus administration of intravenous contrast. Multiplanar CT image reconstructions and MIPs were obtained to evaluate the vascular anatomy. CONTRAST:  155mL OMNIPAQUE IOHEXOL 350 MG/ML SOLN IV COMPARISON:  None FINDINGS: Cardiovascular: Aorta normal caliber without aneurysm or dissection. Heart unremarkable. No pericardial effusion. Pulmonary arteries well opacified and patent. No evidence of pulmonary embolism. Mediastinum/Nodes: Base of cervical region normal appearance. Esophagus unremarkable. No thoracic adenopathy. Normal sized axillary lymph nodes noted bilaterally. Minimal residual thymic tissue in anterior mediastinum. Lungs/Pleura: Minimal subsegmental atelectasis dependently in LEFT lower lobe. Lungs otherwise clear. No infiltrate, pleural effusion or pneumothorax. Upper Abdomen: Unremarkable Musculoskeletal: Normal appearance Review of the MIP images confirms the above findings. IMPRESSION: Minimal subsegmental atelectasis LEFT lower lobe. No evidence of pulmonary embolism. No other intrathoracic abnormalities. Electronically Signed   By: Lavonia Dana M.D.   On: 03/01/2019 20:21   Dg Chest Port 1 View  Result Date:  03/01/2019 CLINICAL DATA:  Pt c/o sickle cell pain in bilateral lower extremities. Pt states it began last night and she has taken a tylenol pm this morning. H/o asthma. Smoker. EXAM: PORTABLE CHEST - 1 VIEW COMPARISON:  10/17/2018 FINDINGS: Lungs are clear. Stable mild cardiomegaly. No effusion.  No pneumothorax. Visualized bones unremarkable. IMPRESSION: No acute cardiopulmonary disease. Electronically Signed   By: Lucrezia Europe M.D.   On: 03/01/2019 14:49    Microbiology: Recent Results (from the past 240 hour(s))  SARS Coronavirus 2 (CEPHEID - Performed in [place] hospital lab), Hosp Order     Status: None   Collection Time: 03/26/19  8:15 PM  Result Value Ref Range Status   SARS Coronavirus 2 NEGATIVE NEGATIVE Final    Comment: (NOTE) If result is NEGATIVE SARS-CoV-2 target nucleic acids are NOT DETECTED. The SARS-CoV-2 RNA is generally detectable in upper and lower  respiratory specimens during the acute phase of infection. The lowest  concentration of SARS-CoV-2 viral copies this assay can detect is 250  copies / mL. A negative result does not preclude SARS-CoV-2 infection  and should not be used as the sole basis for treatment or other  patient management decisions.  A negative result may occur with  improper specimen collection / handling, submission of specimen other  than nasopharyngeal swab, presence of viral mutation(s) within the  areas targeted by this assay, and inadequate number of viral copies  (<250 copies / mL). A negative result must be combined with clinical  observations, patient history, and epidemiological information. If result is POSITIVE SARS-CoV-2 target nucleic acids are DETECTED. The SARS-CoV-2 RNA is generally detectable in upper and lower  respiratory specimens dur ing the acute phase of infection.  Positive  results are indicative of active infection with SARS-CoV-2.  Clinical  correlation with patient history and other diagnostic information is   necessary to determine patient infection status.  Positive results do  not rule out bacterial infection or co-infection with other viruses. If result is PRESUMPTIVE POSTIVE SARS-CoV-2 nucleic acids MAY BE PRESENT.   A presumptive positive result was obtained on the submitted specimen  and confirmed on repeat testing.  While 2019 novel coronavirus  (SARS-CoV-2) nucleic acids may be present in the submitted sample  additional confirmatory testing may be necessary for epidemiological  and / or clinical management purposes  to differentiate between  SARS-CoV-2 and other Sarbecovirus currently known to infect humans.  If clinically indicated additional testing with an alternate test  methodology [phone]) is advised. The SARS-CoV-2 RNA is generally  detectable in upper and lower respiratory sp ecimens during the acute  phase of infection. The expected result is Negative. Fact Sheet for Patients:  StrictlyIdeas.no Fact Sheet for Healthcare Providers: BankingDealers.co.za This test is not yet approved or cleared by the [place] FDA and has been authorized for detection and/or diagnosis of SARS-CoV-2 by FDA under an Emergency Use Authorization (EUA).  This EUA will remain in effect (meaning this test can be used) for the duration of the COVID-19 declaration under Section 564(b)(1) of the Act, 21 U.S.C. section 360bbb-3(b)(1), unless the authorization is terminated or revoked sooner. Performed at [facility], [place] [address] [place], [place] [address]      Labs: Basic Metabolic Panel: Recent Labs  Lab 03/26/19 2015 03/27/19 0409 03/28/19 0843  NA 140  --  137  K 4.2  --  4.3  CL 108  --  106  CO2 26  --  25  GLUCOSE 100*  --  100*  BUN 10  --  18  CREATININE 0.74  --  0.84  CALCIUM 9.0  --  8.6*  MG  --  2.1  --   PHOS  --  5.3*  --    Liver Function Tests: Recent Labs  Lab 03/26/19 2015  AST 52*   ALT 67*  ALKPHOS 85  BILITOT 2.4*  PROT 6.9  ALBUMIN 3.9   No results for input(s): LIPASE, AMYLASE in the last 168 hours. No results for input(s): AMMONIA in the last 168 hours. CBC: Recent Labs  Lab 03/26/19 2015 03/28/19 0843  WBC 12.7* 13.0*  NEUTROABS 4.9  --   HGB 8.4* 8.1*  HCT 23.9* 22.9*  MCV 104.4* 104.6*  PLT 500* 485*   Cardiac Enzymes: No results for input(s): CKTOTAL, CKMB, CKMBINDEX, TROPONINI in the last 168 hours. BNP: Invalid input(s): POCBNP CBG: No results for input(s): GLUCAP in the last 168 hours.  Time coordinating discharge: 50 minutes  Signed:  Donia Pounds  APRN, MSN, FNP-C [facility] [place], [place] [address] [phone]  [facility] 03/28/2019, 4:53 PM

## 2019-03-28 NOTE — Progress Notes (Signed)
Witnessed Benedetto Goad, RN waste 16mg  of high dose Dilaudid (PCA). Donne Hazel, RN

## 2019-03-28 NOTE — Progress Notes (Signed)
Wasted 16mg  of remainder of highdose DILAUDID PCA (30mg /ml). Wasted in stericycle with Donne Hazel, RN.

## 2019-03-28 NOTE — Discharge Instructions (Signed)
°Resume all home medications.  °Follow up with PCP as previously  scheduled.  ° °Discussed the importance of drinking 64 ounces of water daily to  help prevent pain crises, it is important to drink plenty of water throughout the day. This is because dehydration of red blood cells may lead further sickling.  ° °Avoid all stressors that precipitate sickle cell pain crisis.   ° ° °The patient was given clear instructions to go to ER or return to medical center if symptoms do not improve, worsen or new problems develop.  ° ° ° °Sickle Cell Anemia, Adult °Sickle cell anemia is a condition where your red blood cells are shaped like sickles. Red blood cells carry oxygen through the body. Sickle-shaped cells do not live as long as normal red blood cells. They also clump together and block blood from flowing through the blood vessels. This prevents the body from getting enough oxygen. Sickle cell anemia causes organ damage and pain. It also increases the risk of infection. °Follow these instructions at home: °Medicines °· Take over-the-counter and prescription medicines only as told by your doctor. °· If you were prescribed an antibiotic medicine, take it as told by your doctor. Do not stop taking the antibiotic even if you start to feel better. °· If you develop a fever, do not take medicines to lower the fever right away. Tell your doctor about the fever. °Managing pain, stiffness, and swelling °· Try these methods to help with pain: °? Use a heating pad. °? Take a warm bath. °? Distract yourself, such as by watching TV. °Eating and drinking °· Drink enough fluid to keep your pee (urine) clear or pale yellow. Drink more in hot weather and during exercise. °· Limit or avoid alcohol. °· Eat a healthy diet. Eat plenty of fruits, vegetables, whole grains, and lean protein. °· Take vitamins and supplements as told by your doctor. °Traveling °· When traveling, keep these with you: °? Your medical information. °? The names of  your doctors. °? Your medicines. °· If you need to take an airplane, talk to your doctor first. °Activity °· Rest often. °· Avoid exercises that make your heart beat much faster, such as jogging. °General instructions °· Do not use products that have nicotine or tobacco, such as cigarettes and e-cigarettes. If you need help quitting, ask your doctor. °· Consider wearing a medical alert bracelet. °· Avoid being in high places (high altitudes), such as mountains. °· Avoid very hot or cold temperatures. °· Avoid places where the temperature changes a lot. °· Keep all follow-up visits as told by your doctor. This is important. °Contact a doctor if: °· A joint hurts. °· Your feet or hands hurt or swell. °· You feel tired (fatigued). °Get help right away if: °· You have symptoms of infection. These include: °? Fever. °? Chills. °? Being very tired. °? Irritability. °? Poor eating. °? Throwing up (vomiting). °· You feel dizzy or faint. °· You have new stomach pain, especially on the left side. °· You have a an erection (priapism) that lasts more than 4 hours. °· You have numbness in your arms or legs. °· You have a hard time moving your arms or legs. °· You have trouble talking. °· You have pain that does not go away when you take medicine. °· You are short of breath. °· You are breathing fast. °· You have a long-term cough. °· You have pain in your chest. °· You have a bad headache. °·   You have a stiff neck. °· Your stomach looks bloated even though you did not eat much. °· Your skin is pale. °· You suddenly cannot see well. °Summary °· Sickle cell anemia is a condition where your red blood cells are shaped like sickles. °· Follow your doctor's advice on ways to manage pain, food to eat, activities to do, and steps to take for safe travel. °· Get medical help right away if you have any signs of infection, such as a fever. °This information is not intended to replace advice given to you by your health care provider. Make  sure you discuss any questions you have with your health care provider. °Document Released: 08/06/2013 Document Revised: 11/21/2016 Document Reviewed: 11/21/2016 °Elsevier Interactive Patient Education © 2019 [employer] Inc. ° °

## 2019-03-28 NOTE — Progress Notes (Signed)
RN reviewed discharge instructions with patient and family. All questions answered.   Paperwork given to patient.   NT rolled patient down with all belongings to family car.

## 2019-03-28 NOTE — Progress Notes (Signed)
Pharmacy IV to PO conversion  The patient is ordered Diphenhydramine by the intravenous route with a linked PO option available.  Based on criteria approved by the Pharmacy and [place], the IV option is being discontinued.   Not prescribed to treat or prevent a severe allergic reaction   Not prescribed as premedication prior to receiving blood product, biologic medication, antimicrobial, or chemotherapy agent   The patient has tolerated at least one dose of an oral or enteral medication   The patient has no evidence of active gastrointestinal bleeding or impaired GI absorption (gastrectomy, short bowel, patient on TNA or NPO).   The patient is not undergoing procedural sedation  If you have any questions about this conversion, please contact the Pharmacy Department (ext [phone]).  Thank you.  Reuel Boom, PharmD, BCPS [phone] 03/28/2019, 10:35 AM

## 2019-04-08 ENCOUNTER — Ambulatory Visit: Payer: Self-pay | Admitting: Family Medicine

## 2019-06-08 ENCOUNTER — Emergency Department (HOSPITAL_COMMUNITY): Payer: Medicaid Other

## 2019-06-08 ENCOUNTER — Other Ambulatory Visit: Payer: Self-pay

## 2019-06-08 ENCOUNTER — Emergency Department (HOSPITAL_COMMUNITY)
Admission: EM | Admit: 2019-06-08 | Discharge: 2019-06-08 | Discharge status: Against Medical Advice | Payer: Medicaid Other | Attending: Emergency Medicine | Admitting: Emergency Medicine

## 2019-06-08 DIAGNOSIS — Z79899 Other long term (current) drug therapy: Secondary | ICD-10-CM | POA: Diagnosis not present

## 2019-06-08 DIAGNOSIS — F17210 Nicotine dependence, cigarettes, uncomplicated: Secondary | ICD-10-CM | POA: Diagnosis not present

## 2019-06-08 DIAGNOSIS — Z5320 Procedure and treatment not carried out because of patient's decision for unspecified reasons: Secondary | ICD-10-CM | POA: Insufficient documentation

## 2019-06-08 DIAGNOSIS — R531 Weakness: Secondary | ICD-10-CM | POA: Insufficient documentation

## 2019-06-08 DIAGNOSIS — D571 Sickle-cell disease without crisis: Secondary | ICD-10-CM | POA: Diagnosis not present

## 2019-06-08 LAB — CBG MONITORING, ED: Glucose-Capillary: 91 mg/dL (ref 70–99)

## 2019-06-08 MED ORDER — SODIUM CHLORIDE 0.9 % IV BOLUS: 1000.0000 mL | Freq: Once | INTRAVENOUS | Status: DC

## 2019-06-08 NOTE — ED Provider Notes (Addendum)
[facility] EMERGENCY DEPARTMENT Provider Note   CSN: [id number] Arrival date & time: 06/08/19  1544    History   Chief Complaint Chief Complaint  Patient presents with  . Weakness    HPI [name] is a 23 y.o. female with a past medical history of sickle cell anemia presents to ED for generalized weakness.  All history is provided by EMS who states that patient was called for laundromat where she has been sitting in a chair, slumped over for about an hour.  She notes that she went inside the laundromat to cool down after being outside all day.  Remainder of history is limited as patient is refusing to speak to [place].     HPI  Past Medical History:  Diagnosis Date  . Acute kidney injury ([place]) 05/15/2016  . Amenorrhea   . Anemia    SICKLE CELL  . Asthma   . Drug-induced pruritus 02/26/2015  . GERD (gastroesophageal reflux disease)   . Headache   . Hyperbilirubinemia 02/26/2015  . Sickle cell disease [facility])     Patient Active Problem List   Diagnosis Date Noted  . Cocaine-induced mood disorder ([place])   . Generalized anxiety disorder   . Cocaine abuse ([place])   . Elevated liver enzymes 05/17/2018  . Sickle cell anemia ([place]) 05/16/2018  . Sickle cell crisis ([place]) 05/16/2018  . Sickle cell anemia with crisis ([place]) 12/16/2017  . Substance abuse ([place]) 12/16/2017  . Homelessness 02/12/2017  . Protein-calorie malnutrition, severe 10/21/2016  . Anemia of chronic disease   . Hb-SS disease without crisis ([place])   . 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 Tobacco abuse   . Leukocytosis   . Sickle cell pain crisis ([place]) 02/15/2013  . Abdominal pain 07/25/2012  . Asthma, mild intermittent 12/14/2010    Past Surgical History:  Procedure Laterality Date  . 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 TONSILLECTOMY     Age 68     OB History   No obstetric history on file.      Home  Medications    Prior to Admission medications   Medication Sig Start Date End Date Taking? Authorizing Provider  folic acid (FOLVITE) 1 MG tablet Take 1 tablet (1 mg total) by mouth daily. 03/29/19   Dorena Dew, FNP  gabapentin (NEURONTIN) 100 MG capsule Take 1 capsule (100 mg total) by mouth 3 (three) times daily. 03/28/19   Dorena Dew, FNP  ibuprofen (ADVIL) 800 MG tablet Take 1 tablet (800 mg total) by mouth every 8 (eight) hours as needed. 03/28/19   Dorena Dew, FNP  ondansetron (ZOFRAN) 4 MG tablet Take 1 tablet (4 mg total) by mouth every 8 (eight) hours as needed for nausea. 03/28/19   Dorena Dew, FNP  oxyCODONE-acetaminophen (PERCOCET) 5-325 MG tablet Take 1 tablet by mouth every 6 (six) hours as needed for severe pain. 03/28/19 03/27/20  Dorena Dew, FNP    Family History Family History  Problem Relation Age of Onset  . Hypertension Paternal Grandfather   . Sickle cell trait Father   . Cancer Mother        Died in 16-Feb-2009    Social History Social History   Tobacco Use  . Smoking status: Current Every Day Smoker    Packs/day: 0.50    Years: 10.00    Pack years: 5.00    Types:  Cigarettes  . Smokeless tobacco: Never Used  Substance Use Topics  . Alcohol use: No    Alcohol/week: 6.0 standard drinks    Types: 6 Shots of liquor per week    Comment: Denies 06/12/2014  . Drug use: Yes    Types: Marijuana, Cocaine     Allergies   Patient has no known allergies.   Review of Systems Review of Systems  Unable to perform ROS: Other     Physical Exam Updated Vital Signs BP (!) 99/53   Pulse 68   Temp 98.7 F (37.1 C) (Oral)   Resp 19   Ht 5\' 4"  (1.626 m)   Wt 63.5 kg   SpO2 100%   BMI 24.03 kg/m   Physical Exam Vitals signs and nursing note reviewed.  Constitutional:      General: She is not in acute distress.    Appearance: She is well-developed.  HENT:     Head: Normocephalic and atraumatic.     Nose: Nose normal.  Eyes:      General: No scleral icterus.       Right eye: No discharge.        Left eye: No discharge.     Conjunctiva/sclera: Conjunctivae normal.     Pupils: Pupils are equal, round, and reactive to light.  Neck:     Musculoskeletal: Normal range of motion and neck supple.  Cardiovascular:     Rate and Rhythm: Normal rate and regular rhythm.     Heart sounds: Normal heart sounds. No murmur. No friction rub. No gallop.   Pulmonary:     Effort: Pulmonary effort is normal. No respiratory distress.     Breath sounds: Normal breath sounds.  Abdominal:     General: Bowel sounds are normal. There is no distension.     Palpations: Abdomen is soft.     Tenderness: There is no abdominal tenderness. There is no guarding.  Musculoskeletal: Normal range of motion.  Skin:    General: Skin is warm and dry.     Findings: No rash.  Neurological:     Mental Status: She is alert.     Motor: No abnormal muscle tone.     Coordination: Coordination normal.      ED Treatments / Results  Labs (all labs ordered are listed, but only abnormal results are displayed) Labs Reviewed  COMPREHENSIVE METABOLIC PANEL  CBC WITH DIFFERENTIAL/PLATELET  ETHANOL  RAPID URINE DRUG SCREEN, HOSP PERFORMED  URINALYSIS, ROUTINE W REFLEX MICROSCOPIC  CBG MONITORING, ED  I-STAT BETA HCG BLOOD, ED ([facility], [facility], [facility] ONLY)    EKG None  Radiology Dg Chest 2 View  Result Date: 06/08/2019 CLINICAL DATA:  Syncope EXAM: CHEST - 2 VIEW COMPARISON:  Mar 01, 2019 FINDINGS: Heart size is enlarged. There are prominent interstitial lung markings bilaterally with bibasilar airspace opacities favored to represent a combination of atelectasis and trace bilateral pleural effusions. There is no acute osseous abnormality. There is some elevation of the left hemidiaphragm. IMPRESSION: 1. Cardiomegaly with findings concerning for vascular congestion and possible developing pulmonary edema. 2. Bibasilar atelectasis. Possible trace left-sided pleural  effusion. 3. No pneumothorax. Electronically Signed   By: Constance Holster M.D.   On: 06/08/2019 17:44   Ct Head Wo Contrast  Result Date: 06/08/2019 CLINICAL DATA:  Altered level of consciousness. EXAM: CT HEAD WITHOUT CONTRAST TECHNIQUE: Contiguous axial images were obtained from the base of the skull through the vertex without intravenous contrast. COMPARISON:  Mar 01, 2019 FINDINGS: Brain:  No evidence of acute infarction, hemorrhage, hydrocephalus, extra-axial collection or mass lesion/mass effect. Vascular: No hyperdense vessel or unexpected calcification. Skull: Normal. Negative for fracture or focal lesion. Sinuses/Orbits: No acute finding. Other: None. IMPRESSION: No acute intracranial abnormality. Electronically Signed   By: Fidela Salisbury M.D.   On: 06/08/2019 18:05    Procedures Procedures (including critical care time)  Medications Ordered in ED Medications - No data to display   Initial Impression / Assessment and Plan / ED Course  I have reviewed the triage vital signs and the nursing notes.  Pertinent labs & imaging results that were available during my care of the patient were reviewed by me and considered in my medical decision making (see chart for details).        Patient back to baseline after 3 hours here in the ED. She is able to provide more history, states that she went to [place] to get a pizza, ate it and then went to the laundromat to sleep.  States that she was sleeping and then woke up and then went back to sleep again and next thing she knew EMS was being called on her.  Patient states that she will have blood work drawn if we give her something to eat.  However, patient eloped before lab work could be obtained as she states she feels fine.  CT of the head is negative.  Chest x-ray with concerns for pulmonary edema although patient does not appear to be in respiratory distress. I did see her ambulate throughout the ED without difficulty.  Final  Clinical Impressions(s) / ED Diagnoses   Final diagnoses:  Generalized weakness    ED Discharge Orders    None        Delia Heady, PA-C 06/08/19 2012    Lucrezia Starch, MD 06/09/19 1330

## 2019-06-08 NOTE — ED Triage Notes (Signed)
EMS called to laundromat, by stander states she had been sitting in a chair, slumped over for about an hour. Pt states she had been out in the heat all day. Pt denies drugs and etoh.

## 2019-06-08 NOTE — ED Notes (Signed)
Pt refused bloodwork, left AMA

## 2019-06-08 NOTE — ED Notes (Addendum)
Pt refusing IV and bloodwork. Pt jerks arms away and crosses them in front of her when informed we need to stick her for labs and IV fluids. PA notified.

## 2019-06-09 ENCOUNTER — Encounter (HOSPITAL_COMMUNITY): Payer: Self-pay

## 2019-06-09 ENCOUNTER — Emergency Department (HOSPITAL_COMMUNITY)
Admission: EM | Admit: 2019-06-09 | Discharge: 2019-06-09 | Discharge status: Against Medical Advice | Payer: Medicaid Other | Attending: Emergency Medicine | Admitting: Emergency Medicine

## 2019-06-09 ENCOUNTER — Other Ambulatory Visit: Payer: Self-pay

## 2019-06-09 DIAGNOSIS — Z5320 Procedure and treatment not carried out because of patient's decision for unspecified reasons: Secondary | ICD-10-CM | POA: Insufficient documentation

## 2019-06-09 DIAGNOSIS — F17210 Nicotine dependence, cigarettes, uncomplicated: Secondary | ICD-10-CM | POA: Diagnosis not present

## 2019-06-09 DIAGNOSIS — J45909 Unspecified asthma, uncomplicated: Secondary | ICD-10-CM | POA: Insufficient documentation

## 2019-06-09 DIAGNOSIS — Z79899 Other long term (current) drug therapy: Secondary | ICD-10-CM | POA: Insufficient documentation

## 2019-06-09 DIAGNOSIS — R1084 Generalized abdominal pain: Secondary | ICD-10-CM | POA: Insufficient documentation

## 2019-06-09 MED ORDER — SODIUM CHLORIDE 0.9% FLUSH: 3.0000 mL | Freq: Once | INTRAVENOUS | Status: DC

## 2019-06-09 NOTE — ED Triage Notes (Signed)
Pt reports sickle cell pain in her legs, back, and abdomen. States that it is normal for her crises. She denies taking any medication PTA. Endorses nausea.

## 2019-06-09 NOTE — ED Notes (Signed)
This RN attempted IV start in order to collect lab work.  Pt began jerking arm, requesting RN to stop.  This RN informed her of need for lab work in order to proceed with plan of care.  Pt stating "I feel better, I want to go."  This RN again informed her of necessity for lab work d/t her c/o abdominal pain.  Pt again stating " Im fine, I want to go."  Pt observed to be very sleepy, easily falls asleep during assessment. Pt easily awoken, slurring words. PA Roselyn Reef, made aware.

## 2019-06-09 NOTE — ED Provider Notes (Signed)
[facility] DEPT Provider Note   CSN: [id number] Arrival date & time: 06/09/19  9485    History   Chief Complaint Chief Complaint  Patient presents with  . Sickle Cell Pain Crisis    HPI [name] is a 23 y.o. female.     The history is provided by the patient and medical records. No language interpreter was used.  Sickle Cell Pain Crisis Associated symptoms: nausea and vomiting    [name] is a 23 y.o. female  with a PMH as listed below including sickle cell who presents to the Emergency Department complaining of abdominal pain, nausea and vomiting which began this morning. Patient states this is unusual for her and not c/w her typical sickle cell crisis pain. Denies fever. No bowel changes. No known sick contacts.    Past Medical History:  Diagnosis Date  . Acute kidney injury ([place]) 05/15/2016  . Amenorrhea   . Anemia    SICKLE CELL  . Asthma   . Drug-induced pruritus 02/26/2015  . GERD (gastroesophageal reflux disease)   . Headache   . Hyperbilirubinemia 02/26/2015  . Sickle cell disease [facility])     Patient Active Problem List   Diagnosis Date Noted  . Cocaine-induced mood disorder ([place])   . Generalized anxiety disorder   . Cocaine abuse ([place])   . Elevated liver enzymes 05/17/2018  . Sickle cell anemia ([place]) 05/16/2018  . Sickle cell crisis ([place]) 05/16/2018  . Sickle cell anemia with crisis ([place]) 12/16/2017  . Substance abuse ([place]) 12/16/2017  . Homelessness 02/12/2017  . Protein-calorie malnutrition, severe 10/21/2016  . Anemia of chronic disease   . Hb-SS disease without crisis ([name])   . 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 Tobacco abuse   . Leukocytosis   . Sickle cell pain crisis ([place]) 02/15/2013  . Abdominal pain 07/25/2012  . Asthma, mild intermittent 12/14/2010    Past Surgical History:  Procedure Laterality Date  . SPLENECTOMY      Age 68 for sequestration  . TONSILLECTOMY     Age 21     OB History   No obstetric history on file.      Home Medications    Prior to Admission medications   Medication Sig Start Date End Date Taking? Authorizing Provider  folic acid (FOLVITE) 1 MG tablet Take 1 tablet (1 mg total) by mouth daily. 03/29/19   Dorena Dew, FNP  gabapentin (NEURONTIN) 100 MG capsule Take 1 capsule (100 mg total) by mouth 3 (three) times daily. 03/28/19   Dorena Dew, FNP  ibuprofen (ADVIL) 800 MG tablet Take 1 tablet (800 mg total) by mouth every 8 (eight) hours as needed. 03/28/19   Dorena Dew, FNP  ondansetron (ZOFRAN) 4 MG tablet Take 1 tablet (4 mg total) by mouth every 8 (eight) hours as needed for nausea. 03/28/19   Dorena Dew, FNP  oxyCODONE-acetaminophen (PERCOCET) 5-325 MG tablet Take 1 tablet by mouth every 6 (six) hours as needed for severe pain. 03/28/19 03/27/20  Dorena Dew, FNP    Family History Family History  Problem Relation Age of Onset  . Hypertension Paternal Grandfather   . Sickle cell trait Father   . Cancer Mother        Died in January 24, 2009    Social History Social History   Tobacco Use  . Smoking status: Current Every Day Smoker    Packs/day:  0.50    Years: 10.00    Pack years: 5.00    Types: Cigarettes  . Smokeless tobacco: Never Used  Substance Use Topics  . Alcohol use: No    Alcohol/week: 6.0 standard drinks    Types: 6 Shots of liquor per week    Comment: Denies 06/12/2014  . Drug use: Yes    Types: Marijuana, Cocaine     Allergies   Patient has no known allergies.   Review of Systems Review of Systems  Gastrointestinal: Positive for abdominal pain, nausea and vomiting. Negative for blood in stool, constipation and diarrhea.  All other systems reviewed and are negative.    Physical Exam Updated Vital Signs BP 115/73 (BP Location: Left Arm)   Pulse 73   Temp 97.9 F (36.6 C) (Oral)   Resp 20   Ht 5\' 3"  (1.6 m)   Wt  63 kg   SpO2 100%   BMI 24.62 kg/m   Physical Exam Vitals signs and nursing note reviewed.  Constitutional:      General: She is not in acute distress.    Appearance: She is well-developed.  HENT:     Head: Normocephalic and atraumatic.  Neck:     Musculoskeletal: Neck supple.  Cardiovascular:     Rate and Rhythm: Normal rate and regular rhythm.     Heart sounds: Normal heart sounds. No murmur.  Pulmonary:     Effort: Pulmonary effort is normal. No respiratory distress.     Breath sounds: Normal breath sounds.  Abdominal:     General: There is no distension.     Palpations: Abdomen is soft.     Comments: Generalized abdominal tenderness, R>L.   Skin:    General: Skin is warm and dry.  Neurological:     Mental Status: She is alert and oriented to person, place, and time.      ED Treatments / Results  Labs (all labs ordered are listed, but only abnormal results are displayed) Labs Reviewed  COMPREHENSIVE METABOLIC PANEL  CBC WITH DIFFERENTIAL/PLATELET  RETICULOCYTES  LIPASE, BLOOD  I-STAT BETA HCG BLOOD, ED ([facility], [facility], [facility] ONLY)    EKG None  Radiology Dg Chest 2 View  Result Date: 06/08/2019 CLINICAL DATA:  Syncope EXAM: CHEST - 2 VIEW COMPARISON:  Mar 01, 2019 FINDINGS: Heart size is enlarged. There are prominent interstitial lung markings bilaterally with bibasilar airspace opacities favored to represent a combination of atelectasis and trace bilateral pleural effusions. There is no acute osseous abnormality. There is some elevation of the left hemidiaphragm. IMPRESSION: 1. Cardiomegaly with findings concerning for vascular congestion and possible developing pulmonary edema. 2. Bibasilar atelectasis. Possible trace left-sided pleural effusion. 3. No pneumothorax. Electronically Signed   By: Constance Holster M.D.   On: 06/08/2019 17:44   Ct Head Wo Contrast  Result Date: 06/08/2019 CLINICAL DATA:  Altered level of consciousness. EXAM: CT HEAD WITHOUT CONTRAST TECHNIQUE:  Contiguous axial images were obtained from the base of the skull through the vertex without intravenous contrast. COMPARISON:  Mar 01, 2019 FINDINGS: Brain: No evidence of acute infarction, hemorrhage, hydrocephalus, extra-axial collection or mass lesion/mass effect. Vascular: No hyperdense vessel or unexpected calcification. Skull: Normal. Negative for fracture or focal lesion. Sinuses/Orbits: No acute finding. Other: None. IMPRESSION: No acute intracranial abnormality. Electronically Signed   By: Fidela Salisbury M.D.   On: 06/08/2019 18:05    Procedures Procedures (including critical care time)  Medications Ordered in ED Medications  sodium chloride flush (NS) 0.9 % injection  3 mL (has no administration in time range)     Initial Impression / Assessment and Plan / ED Course  I have reviewed the triage vital signs and the nursing notes.  Pertinent labs & imaging results that were available during my care of the patient were reviewed by me and considered in my medical decision making (see chart for details).       [name] is a 23 y.o. female who presents to ED for abdominal pain, nausea and vomtiing which began today. She tells me that this does not feel similar to her sickle cell crisis pain. She is afebrile and VSS. She does have generalized abdominal tenderness with right-sided pain seeming to be more significant than left.  Initial plan was to obtain blood work and CT scan for further evaluation.  Patient initially seemed agreeable to this plan.  Nursing staff went in to start IV and obtain blood work, but patient did not want this.  I was notified by nursing staff that she said she would like to leave.  I did go into the room and speak with her at this time.  I discussed my concerns with her, especially with her history of sickle cell and abdominal pain not consistent with her sickle cell crises.  I specifically discussed that without labs and CT I cannot guarantee there is  not a life-threatening event occurring.  She still refuses needlestick and asking to go home. Pt is A&Ox4, her own POA and states understanding of my concerns and the possible consequences. She  Understands that she may return at any time for further evaluation and treatment.  Final Clinical Impressions(s) / ED Diagnoses   Final diagnoses:  Generalized abdominal pain    ED Discharge Orders    None       , Ozella Almond, PA-C 06/09/19 3709    Milton Ferguson, MD 06/10/19 [phone]

## 2019-07-08 ENCOUNTER — Inpatient Hospital Stay (HOSPITAL_COMMUNITY)
Admission: EM | Admit: 2019-07-08 | Discharge: 2019-07-09 | DRG: 811 | Discharge status: Against Medical Advice | Payer: Medicaid Other | Attending: Internal Medicine | Admitting: Internal Medicine

## 2019-07-08 ENCOUNTER — Encounter (HOSPITAL_COMMUNITY): Payer: Self-pay

## 2019-07-08 ENCOUNTER — Other Ambulatory Visit: Payer: Self-pay

## 2019-07-08 ENCOUNTER — Emergency Department (HOSPITAL_COMMUNITY): Payer: Medicaid Other

## 2019-07-08 DIAGNOSIS — Z79891 Long term (current) use of opiate analgesic: Secondary | ICD-10-CM | POA: Diagnosis not present

## 2019-07-08 DIAGNOSIS — F1410 Cocaine abuse, uncomplicated: Secondary | ICD-10-CM | POA: Diagnosis present

## 2019-07-08 DIAGNOSIS — J189 Pneumonia, unspecified organism: Secondary | ICD-10-CM | POA: Diagnosis present

## 2019-07-08 DIAGNOSIS — Z79899 Other long term (current) drug therapy: Secondary | ICD-10-CM

## 2019-07-08 DIAGNOSIS — G894 Chronic pain syndrome: Secondary | ICD-10-CM | POA: Diagnosis present

## 2019-07-08 DIAGNOSIS — D5700 Hb-SS disease with crisis, unspecified: Secondary | ICD-10-CM | POA: Diagnosis present

## 2019-07-08 DIAGNOSIS — F17210 Nicotine dependence, cigarettes, uncomplicated: Secondary | ICD-10-CM | POA: Diagnosis present

## 2019-07-08 DIAGNOSIS — F1414 Cocaine abuse with cocaine-induced mood disorder: Secondary | ICD-10-CM | POA: Diagnosis present

## 2019-07-08 DIAGNOSIS — Z9081 Acquired absence of spleen: Secondary | ICD-10-CM | POA: Diagnosis not present

## 2019-07-08 DIAGNOSIS — F411 Generalized anxiety disorder: Secondary | ICD-10-CM | POA: Diagnosis present

## 2019-07-08 DIAGNOSIS — J181 Lobar pneumonia, unspecified organism: Secondary | ICD-10-CM | POA: Diagnosis not present

## 2019-07-08 DIAGNOSIS — Z20828 Contact with and (suspected) exposure to other viral communicable diseases: Secondary | ICD-10-CM | POA: Diagnosis present

## 2019-07-08 DIAGNOSIS — Z791 Long term (current) use of non-steroidal anti-inflammatories (NSAID): Secondary | ICD-10-CM | POA: Diagnosis not present

## 2019-07-08 DIAGNOSIS — D72829 Elevated white blood cell count, unspecified: Secondary | ICD-10-CM | POA: Diagnosis present

## 2019-07-08 DIAGNOSIS — F1494 Cocaine use, unspecified with cocaine-induced mood disorder: Secondary | ICD-10-CM | POA: Diagnosis not present

## 2019-07-08 DIAGNOSIS — Z8249 Family history of ischemic heart disease and other diseases of the circulatory system: Secondary | ICD-10-CM

## 2019-07-08 DIAGNOSIS — R0602 Shortness of breath: Secondary | ICD-10-CM | POA: Diagnosis present

## 2019-07-08 DIAGNOSIS — Z5329 Procedure and treatment not carried out because of patient's decision for other reasons: Secondary | ICD-10-CM | POA: Diagnosis present

## 2019-07-08 DIAGNOSIS — Z832 Family history of diseases of the blood and blood-forming organs and certain disorders involving the immune mechanism: Secondary | ICD-10-CM | POA: Diagnosis not present

## 2019-07-08 LAB — COMPREHENSIVE METABOLIC PANEL
ALT: 22 U/L (ref 0–44)
AST: 18 U/L (ref 15–41)
Albumin: 4.8 g/dL (ref 3.5–5.0)
Alkaline Phosphatase: 71 U/L (ref 38–126)
Anion gap: 12 (ref 5–15)
BUN: 9 mg/dL (ref 6–20)
CO2: 23 mmol/L (ref 22–32)
Calcium: 9.2 mg/dL (ref 8.9–10.3)
Chloride: 101 mmol/L (ref 98–111)
Creatinine, Ser: 0.88 mg/dL (ref 0.44–1.00)
GFR calc Af Amer: >60 mL/min (ref >60)
GFR calc non Af Amer: >60 mL/min (ref >60)
Glucose, Bld: 115 mg/dL — ABNORMAL HIGH (ref 70–99)
Potassium: 3.5 mmol/L (ref 3.5–5.1)
Sodium: 136 mmol/L (ref 135–145)
Total Bilirubin: 6.5 mg/dL — ABNORMAL HIGH (ref 0.3–1.2)
Total Protein: 8.9 g/dL — ABNORMAL HIGH (ref 6.5–8.1)

## 2019-07-08 LAB — LACTIC ACID, PLASMA
Lactic Acid, Venous: 0.7 mmol/L (ref 0.5–1.9)
Lactic Acid, Venous: 0.7 mmol/L (ref 0.5–1.9)

## 2019-07-08 LAB — CBC
HCT: 24.3 % — ABNORMAL LOW (ref 36.0–46.0)
Hemoglobin: 8.7 g/dL — ABNORMAL LOW (ref 12.0–15.0)
MCH: 36 pg — ABNORMAL HIGH (ref 26.0–34.0)
MCHC: 35.8 g/dL (ref 30.0–36.0)
MCV: 100.4 fL — ABNORMAL HIGH (ref 80.0–100.0)
Platelets: 636 10*3/uL — ABNORMAL HIGH (ref 150–400)
RBC: 2.42 MIL/uL — ABNORMAL LOW (ref 3.87–5.11)
RDW: 18.4 % — ABNORMAL HIGH (ref 11.5–15.5)
WBC: 33.7 10*3/uL — ABNORMAL HIGH (ref 4.0–10.5)
nRBC: 0.7 % — ABNORMAL HIGH (ref 0.0–0.2)

## 2019-07-08 LAB — URINALYSIS, ROUTINE W REFLEX MICROSCOPIC
Bilirubin Urine: NEGATIVE
Glucose, UA: NEGATIVE mg/dL
Ketones, ur: NEGATIVE mg/dL
Nitrite: NEGATIVE
Protein, ur: NEGATIVE mg/dL
Specific Gravity, Urine: 1.006 (ref 1.005–1.030)
pH: 5 (ref 5.0–8.0)

## 2019-07-08 LAB — RAPID URINE DRUG SCREEN, HOSP PERFORMED
Amphetamines: NOT DETECTED
Barbiturates: NOT DETECTED
Benzodiazepines: NOT DETECTED
Cocaine: POSITIVE — AB
Opiates: NOT DETECTED
Tetrahydrocannabinol: NOT DETECTED

## 2019-07-08 LAB — RETICULOCYTES
Immature Retic Fract: 30.6 % — ABNORMAL HIGH (ref 2.3–15.9)
RBC.: 2.42 MIL/uL — ABNORMAL LOW (ref 3.87–5.11)
Retic Count, Absolute: 400.5 10*3/uL — ABNORMAL HIGH (ref 19.0–186.0)
Retic Ct Pct: 15.1 % — ABNORMAL HIGH (ref 0.4–3.1)

## 2019-07-08 LAB — CREATININE, SERUM
Creatinine, Ser: 0.9 mg/dL (ref 0.44–1.00)
GFR calc Af Amer: >60 mL/min (ref >60)
GFR calc non Af Amer: >60 mL/min (ref >60)

## 2019-07-08 LAB — SARS CORONAVIRUS 2 BY RT PCR (HOSPITAL ORDER, PERFORMED IN ~~LOC~~ HOSPITAL LAB): SARS Coronavirus 2: NEGATIVE

## 2019-07-08 MED ORDER — OXYCODONE HCL 5 MG PO TABS
10.0000 mg | ORAL_TABLET | ORAL | Status: DC | PRN
  Administered 2019-07-08 (×2): 10 mg via ORAL
  Filled 2019-07-08 (×2): qty 2

## 2019-07-08 MED ORDER — KETOROLAC TROMETHAMINE 15 MG/ML IJ SOLN
15.0000 mg | Freq: Four times a day (QID) | INTRAMUSCULAR | Status: DC
  Administered 2019-07-08 (×2): 15 mg via INTRAVENOUS
  Filled 2019-07-08 (×2): qty 1

## 2019-07-08 MED ORDER — SENNOSIDES-DOCUSATE SODIUM 8.6-50 MG PO TABS
1.0000 | ORAL_TABLET | Freq: Two times a day (BID) | ORAL | Status: DC
  Administered 2019-07-08 (×2): 1 via ORAL
  Filled 2019-07-08 (×2): qty 1

## 2019-07-08 MED ORDER — LORAZEPAM 1 MG PO TABS
1.0000 mg | ORAL_TABLET | Freq: Four times a day (QID) | ORAL | Status: DC | PRN
  Administered 2019-07-08 (×2): 1 mg via ORAL
  Filled 2019-07-08 (×2): qty 1

## 2019-07-08 MED ORDER — HYDROMORPHONE HCL 1 MG/ML IJ SOLN
1.0000 mg | Freq: Once | INTRAMUSCULAR | Status: AC
  Administered 2019-07-08: 1 mg via INTRAVENOUS
  Filled 2019-07-08: qty 1

## 2019-07-08 MED ORDER — ONDANSETRON HCL 4 MG/2ML IJ SOLN
4.0000 mg | Freq: Once | INTRAMUSCULAR | Status: AC
  Administered 2019-07-08: 13:00:00 4 mg via INTRAVENOUS
  Filled 2019-07-08: qty 2

## 2019-07-08 MED ORDER — ENOXAPARIN SODIUM 40 MG/0.4ML ~~LOC~~ SOLN
40.0000 mg | SUBCUTANEOUS | Status: DC
  Filled 2019-07-08 (×2): qty 0.4

## 2019-07-08 MED ORDER — POLYETHYLENE GLYCOL 3350 17 G PO PACK: 17.0000 g | PACK | Freq: Every day | ORAL | Status: DC | PRN

## 2019-07-08 MED ORDER — FOLIC ACID 1 MG PO TABS
1.0000 mg | ORAL_TABLET | Freq: Every day | ORAL | Status: DC
  Administered 2019-07-08: 1 mg via ORAL
  Filled 2019-07-08: qty 1

## 2019-07-08 MED ORDER — SODIUM CHLORIDE 0.9 % IV BOLUS
500.0000 mL | Freq: Once | INTRAVENOUS | Status: AC
  Administered 2019-07-08: 500 mL via INTRAVENOUS

## 2019-07-08 MED ORDER — SODIUM CHLORIDE 0.9 % IV SOLN
2.0000 g | INTRAVENOUS | Status: DC
  Administered 2019-07-08: 2 g via INTRAVENOUS
  Filled 2019-07-08: qty 20

## 2019-07-08 MED ORDER — GABAPENTIN 100 MG PO CAPS
100.0000 mg | ORAL_CAPSULE | Freq: Three times a day (TID) | ORAL | Status: DC
  Administered 2019-07-08: 23:00:00 100 mg via ORAL
  Filled 2019-07-08 (×2): qty 1

## 2019-07-08 MED ORDER — SODIUM CHLORIDE 0.9 % IV SOLN
500.0000 mg | INTRAVENOUS | Status: DC
  Administered 2019-07-08: 500 mg via INTRAVENOUS
  Filled 2019-07-08: qty 500

## 2019-07-08 NOTE — ED Notes (Signed)
Pt provided with dinner tray.

## 2019-07-08 NOTE — ED Notes (Signed)
Pt has asked for 2 sandwiches and Coke, cheese, and crackers. Pt called out and said she needed her bed changed. Pt spilled entire contents of drink in the bed and on the floor, and was found to be sitting in a chair with no pants on, had removed leads, pulse ox and BP cuff. Bed linens changed, all cords reconnected and pt is now back in bed.

## 2019-07-08 NOTE — ED Notes (Signed)
Unsuccessful IV attempt x2.  

## 2019-07-08 NOTE — ED Triage Notes (Signed)
Pt arrives [facility] from home for [facility], chest wall pain. Pt has a hx of asthma and sickle cell disease.

## 2019-07-08 NOTE — H&P (Signed)
H&P  [name], is a 23 y.o. female  MRN: [id number]   DOB - [birth year]  Admit Date - 07/08/2019  Outpatient Primary MD for the patient is Dorena Dew, FNP  Chief Complaint  Patient presents with  . Shortness of Breath      HPI:   [name]  is a 23 y.o. female [place] medical history significant for sickle cell disease, chronic pain syndrome, history of cocaine abuse, and history of generalized anxiety disorder presented to ER with shortness of breath over the past several days.  Patient also endorses "pain all over".  She states that pain and shortness of breath have been worsening over the last several days.  She has not identified any palliative or provocative factors concerning current crisis.  Patient does not have a PCP and does not have home pain medications.  Current pain intensity is 10/10 characterized as constant and throbbing.  Patient endorses persistent cough.  She denies sick contacts, recent travel, or exposure COVID-19.  Patient states that she had a fever at home, she felt hot but did not check her temperature. She reports some chest pain primarily to left, nonradiating.  Pain is mostly to low back and lower extremities.  She denies dizziness, paresthesias, abdominal pain, nausea, vomiting, or diarrhea.  ER course: Chest x-ray shows interval development of patchy airspace opacity in the medial aspect of the right middle lobe.  WBCs 33,000.  Lactic acid negative.  COVID-19 negative.  Blood cultures pending.  Hemoglobin 8.7, appears to be consistent with patient's baseline.  Platelets 636 and total bilirubin 6.5.  Blood pressure 107/61, HR 100, respirations 20, oxygen saturation 92% on RA.  Patient admitted for pain crisis in the presence of CAP versus acute chest syndrome   Review of systems:  In addition to the HPI above, patient reports Physical Exam Constitutional:      Appearance: She is well-developed.  Neck:      Musculoskeletal: Normal range of motion.  Cardiovascular:     Rate and Rhythm: Normal rate and regular rhythm.     Pulses: Normal pulses.  Pulmonary:     Effort: Pulmonary effort is normal.     Breath sounds: Examination of the right-upper field reveals rhonchi. Examination of the left-upper field reveals rhonchi. Rhonchi present. No decreased breath sounds.  Chest:     Chest wall: No tenderness.  Musculoskeletal: Normal range of motion.  Skin:    General: Skin is warm.  Neurological:     General: No focal deficit present.     Mental Status: She is alert.  Psychiatric:        Attention and Perception: She is inattentive. She does not perceive auditory or visual hallucinations.        Mood and Affect: Mood is anxious.        Behavior: Behavior is agitated.        Thought Content: Thought content does not include homicidal or suicidal ideation. Thought content does not include homicidal or suicidal plan.     A full 10 point Review of Systems was done, except as stated above, all other Review of Systems were negative.  With Past History of the following :   Past Medical History:  Diagnosis Date  . Acute kidney injury ([place]) 05/15/2016  . Amenorrhea   . Anemia    SICKLE CELL  . Asthma   . Drug-induced pruritus 02/26/2015  . GERD (gastroesophageal reflux disease)   . Headache   .  Hyperbilirubinemia 02/26/2015  . Sickle cell disease ([place])       Past Surgical History:  Procedure Laterality Date  . 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 TONSILLECTOMY     Age 52     Social History:   Social History   Tobacco Use  . Smoking status: Current Every Day Smoker    Packs/day: 0.50    Years: 10.00    Pack years: 5.00    Types: Cigarettes  . Smokeless tobacco: Never Used  Substance Use Topics  . Alcohol use: Not Currently    Alcohol/week: 6.0 standard drinks    Types: 6 Shots of liquor per week     Lives - At home   Family History :   Family History  Problem  Relation Age of Onset  . Hypertension Paternal Grandfather   . Sickle cell trait Father   . Cancer Mother        Died in 02-07-2009     Home Medications:   Prior to Admission medications   Medication Sig Start Date End Date Taking? Authorizing Provider  folic acid (FOLVITE) 1 MG tablet Take 1 tablet (1 mg total) by mouth daily. 03/29/19  Yes Dorena Dew, FNP  gabapentin (NEURONTIN) 100 MG capsule Take 1 capsule (100 mg total) by mouth 3 (three) times daily. 03/28/19  Yes Dorena Dew, FNP  ibuprofen (ADVIL) 800 MG tablet Take 1 tablet (800 mg total) by mouth every 8 (eight) hours as needed. Patient not taking: Reported on 07/08/2019 03/28/19   Dorena Dew, FNP  ondansetron (ZOFRAN) 4 MG tablet Take 1 tablet (4 mg total) by mouth every 8 (eight) hours as needed for nausea. Patient not taking: Reported on 07/08/2019 03/28/19   Dorena Dew, FNP  oxyCODONE-acetaminophen (PERCOCET) 5-325 MG tablet Take 1 tablet by mouth every 6 (six) hours as needed for severe pain. Patient not taking: Reported on 07/08/2019 03/28/19 03/27/20  Dorena Dew, FNP     Allergies:   No Known Allergies   Physical Exam:   Vitals:   Vitals:   07/08/19 1300 07/08/19 1330  BP: 107/61 104/61  Pulse: 99   Resp: 20   Temp:    SpO2: 92%      Data Review:   CBC Recent Labs  Lab 07/08/19 1243  WBC 33.7*  HGB 8.7*  HCT 24.3*  PLT 636*  MCV 100.4*  MCH 36.0*  MCHC 35.8  RDW 18.4*   ------------------------------------------------------------------------------------------------------------------  Chemistries  Recent Labs  Lab 07/08/19 1243  NA 136  K 3.5  CL 101  CO2 23  GLUCOSE 115*  BUN 9  CREATININE 0.88  CALCIUM 9.2  AST 18  ALT 22  ALKPHOS 71  BILITOT 6.5*   ------------------------------------------------------------------------------------------------------------------ estimated creatinine clearance is 99.4 mL/min (by C-G formula based on SCr of 0.88  mg/dL). ------------------------------------------------------------------------------------------------------------------ No results for input(s): TSH, T4TOTAL, T3FREE, THYROIDAB in the last 72 hours.  Invalid input(s): FREET3  Coagulation profile No results for input(s): INR, PROTIME in the last 168 hours. ------------------------------------------------------------------------------------------------------------------- No results for input(s): DDIMER in the last 72 hours. -------------------------------------------------------------------------------------------------------------------  Cardiac Enzymes No results for input(s): CKMB, TROPONINI, MYOGLOBIN in the last 168 hours.  Invalid input(s): CK ------------------------------------------------------------------------------------------------------------------ No results found for: BNP  ---------------------------------------------------------------------------------------------------------------  Urinalysis    Component Value Date/Time   COLORURINE YELLOW 10/18/2018 0242   APPEARANCEUR CLOUDY (A) 10/18/2018 0242   LABSPEC 1.015 10/18/2018 0242   PHURINE 6.0 10/18/2018 0242   GLUCOSEU NEGATIVE  10/18/2018 0242   HGBUR TRACE (A) 10/18/2018 0242   BILIRUBINUR NEGATIVE 10/18/2018 0242   KETONESUR NEGATIVE 10/18/2018 0242   PROTEINUR NEGATIVE 10/18/2018 0242   UROBILINOGEN 1.0 08/11/2015 1948   NITRITE NEGATIVE 10/18/2018 0242   LEUKOCYTESUR TRACE (A) 10/18/2018 0242    ----------------------------------------------------------------------------------------------------------------   Imaging Results:    Dg Chest Port 1 View  Result Date: 07/08/2019 CLINICAL DATA:  23 year old female with sickle cell disease and history of asthma presents with shortness of breath, cough and chest wall pain. EXAM: PORTABLE CHEST 1 VIEW COMPARISON:  Prior chest x-ray 06/08/2019 FINDINGS: Slightly increased indistinct airspace opacity in the  medial right middle lobe partially obscuring the cardiac margin. Otherwise, stable bronchitic changes and mild hyperinflation. No pulmonary edema, pleural effusion or pneumothorax. No acute osseous abnormality. IMPRESSION: Interval development of patchy airspace opacity in the medial aspect of the right middle lobe. Differential considerations include pneumonia, atelectasis, and, in the setting of sickle cell disease, veno occlusive disease. Electronically Signed   By: Jacqulynn Cadet M.D.   On: 07/08/2019 10:20      Assessment & Plan:  Active Problems:   Sickle cell pain crisis (HCC)   Leukocytosis   Generalized anxiety disorder   Cocaine abuse (HCC)   Cocaine-induced mood disorder ([place])   Community acquired pneumonia  Sickle cell disease with pain crisis: Admit, continue IV fluids at 100 mL/h. Patient's urine drug screen was positive for cocaine.  Refrain from IV pain medications. Oxycodone 10 mg every 4 hours for moderate to severe pain. IV Toradol 15 mg every 6 hours 2 L of supplemental oxygen Patient will be reevaluated for pain in the context of functioning and relationship to baseline as her care progresses  CAP versus acute chest syndrome: WBCs 33,700.  Lactic acid negative.  Blood cultures pending.  Chest x-ray shows patchy airspace opacity in the medial aspect of the right middle lobe.  Broad-spectrum antibiotics continued.  Tylenol 650 mg every 4 hours as needed for fever Repeat CBC in a.m.  Leukocytosis, Refer to above.  Repeat CBC in a.m.  Cocaine abuse: Urine drug screen positive for cocaine.  Patient continues to deny cocaine use.  She became agitated when asked about cocaine use.  Cocaine induced mood disorder: Patient denies suicidal or homicidal ideations.  She also denies visual or auditory hallucinations.  Upon arrival to room, patient appeared to be having a conversation, but no one else was there.  When questioned further, patient refused to reply.  Also,  patient's wound was in complete disarray.  Her covers were thrown across the floor, her clothing was off, and she was rocking in bed with a blanket draped across her shoulders. Lorazepam 1 mg by mouth every 6 hours as needed for increased anxiety. Social work consult    DVT Prophylaxis: Subcut Lovenox   AM Labs Ordered, also please review Full Orders  Family Communication: Admission, patient's condition and plan of care including tests being ordered have been discussed with the patient who indicate understanding and agree with the plan and Code Status.  Code Status: Full Code  Consults called: None    Admission status: Inpatient    Time spent in minutes : 50 minutes  [place], MSN, FNP-C [facility] [address] [place], [place] [address] [phone]  07/08/2019 at 2:40 PM

## 2019-07-08 NOTE — ED Provider Notes (Signed)
[facility] DEPT Provider Note   CSN: [id number] Arrival date & time: 07/08/19  [id number]     History   Chief Complaint Chief Complaint  Patient presents with  . Shortness of Breath    HPI [name] is a 23 y.o. female.     Patient with hx sickle cell disease, c/o pain all over for past several days. Symptoms gradual onset, moderate/severe, constant, persistent. Pt very poor historian, often given vague answers to questions. Also notes episodic non prod cough. No sore throat. No known covid+ exposure. No fever - temp in ED 99.5.  Denies specific area of pain. No headache. No neck pain/stiffness. + right chest soreness esp with cough. No constant and/or pleuritic chest pain. No abd pain or nvd. No leg swelling. Denies hx dvt or pe.   The history is provided by the patient.  Shortness of Breath Associated symptoms: cough   Associated symptoms: no abdominal pain, no fever, no headaches, no neck pain, no rash, no sore throat and no vomiting     Past Medical History:  Diagnosis Date  . Acute kidney injury ([place]) 05/15/2016  . Amenorrhea   . Anemia    SICKLE CELL  . Asthma   . Drug-induced pruritus 02/26/2015  . GERD (gastroesophageal reflux disease)   . Headache   . Hyperbilirubinemia 02/26/2015  . Sickle cell disease [facility])     Patient Active Problem List   Diagnosis Date Noted  . Cocaine-induced mood disorder ([place])   . Generalized anxiety disorder   . Cocaine abuse ([place])   . Elevated liver enzymes 05/17/2018  . Sickle cell anemia ([place]) 05/16/2018  . Sickle cell crisis ([place]) 05/16/2018  . Sickle cell anemia with crisis ([place]) 12/16/2017  . Substance abuse ([place]) 12/16/2017  . Homelessness 02/12/2017  . Protein-calorie malnutrition, severe 10/21/2016  . Anemia of chronic disease   . Hb-SS disease without crisis ([place])   . 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 Tobacco abuse    . Leukocytosis   . Sickle cell pain crisis ([place]) 02/15/2013  . Abdominal pain 07/25/2012  . Asthma, mild intermittent 12/14/2010    Past Surgical History:  Procedure Laterality Date  . SPLENECTOMY     Age 89 for sequestration  . TONSILLECTOMY     Age 24     OB History   No obstetric history on file.      Home Medications    Prior to Admission medications   Medication Sig Start Date End Date Taking? Authorizing Provider  folic acid (FOLVITE) 1 MG tablet Take 1 tablet (1 mg total) by mouth daily. 03/29/19   Dorena Dew, FNP  gabapentin (NEURONTIN) 100 MG capsule Take 1 capsule (100 mg total) by mouth 3 (three) times daily. 03/28/19   Dorena Dew, FNP  ibuprofen (ADVIL) 800 MG tablet Take 1 tablet (800 mg total) by mouth every 8 (eight) hours as needed. 03/28/19   Dorena Dew, FNP  ondansetron (ZOFRAN) 4 MG tablet Take 1 tablet (4 mg total) by mouth every 8 (eight) hours as needed for nausea. 03/28/19   Dorena Dew, FNP  oxyCODONE-acetaminophen (PERCOCET) 5-325 MG tablet Take 1 tablet by mouth every 6 (six) hours as needed for severe pain. 03/28/19 03/27/20  Dorena Dew, FNP    Family History Family History  Problem Relation Age of Onset  . Hypertension Paternal Grandfather   . Sickle cell trait Father   .  Cancer Mother        Died in 2010    Social History Social History   Tobacco Use  . Smoking status: Current Every Day Smoker    Packs/day: 0.50    Years: 10.00    Pack years: 5.00    Types: Cigarettes  . Smokeless tobacco: Never Used  Substance Use Topics  . Alcohol use: Not Currently    Alcohol/week: 6.0 standard drinks    Types: 6 Shots of liquor per week  . Drug use: Yes    Types: Marijuana, Cocaine     Allergies   Patient has no known allergies.   Review of Systems Review of Systems  Constitutional: Negative for chills and fever.  HENT: Negative for sore throat.   Eyes: Negative for redness.  Respiratory: Positive for  cough.   Cardiovascular: Negative for leg swelling.  Gastrointestinal: Negative for abdominal pain and vomiting.  Genitourinary: Negative for flank pain.  Musculoskeletal: Negative for neck pain and neck stiffness.       Diffuse body pain  Skin: Negative for rash.  Neurological: Negative for headaches.  Hematological: Does not bruise/bleed easily.  Psychiatric/Behavioral: Negative for confusion.     Physical Exam Updated Vital Signs BP 108/70   Pulse (!) 101 Comment: Simultaneous filing. User may not have seen previous data.  Temp 99.5 F (37.5 C) (Oral)   Resp 20 Comment: Simultaneous filing. User may not have seen previous data.  Ht 1.727 m (5\' 8" )   Wt 65 kg   SpO2 94% Comment: Simultaneous filing. User may not have seen previous data.  BMI 21.79 kg/m   Physical Exam Vitals signs and nursing note reviewed.  Constitutional:      Appearance: Normal appearance. She is well-developed.  HENT:     Nose: Nose normal.     Mouth/Throat:     Mouth: Mucous membranes are moist.  Eyes:     General: No scleral icterus.    Conjunctiva/sclera: Conjunctivae normal.     Pupils: Pupils are equal, round, and reactive to light.  Neck:     Musculoskeletal: Normal range of motion and neck supple. No neck rigidity or muscular tenderness.     Trachea: No tracheal deviation.  Cardiovascular:     Rate and Rhythm: Normal rate and regular rhythm.     Pulses: Normal pulses.     Heart sounds: Normal heart sounds. No murmur. No friction rub. No gallop.   Pulmonary:     Effort: Pulmonary effort is normal. No respiratory distress.     Breath sounds: Rhonchi present.     Comments: Right rhonchi. +mild chest wall tenderness. Normal chest wall movement.  Abdominal:     General: Bowel sounds are normal. There is no distension.     Palpations: Abdomen is soft.     Tenderness: There is no abdominal tenderness. There is no guarding.  Genitourinary:    Comments: No cva tenderness.  Musculoskeletal:         General: No swelling or tenderness.     Right lower leg: No edema.     Left lower leg: No edema.  Skin:    General: Skin is warm and dry.     Findings: No rash.  Neurological:     Mental Status: She is alert.     Comments: Alert, speech normal. Motor/sens grossly intact bil. Steady gait.   Psychiatric:        Mood and Affect: Mood normal.      ED Treatments /  Results  Labs (all labs ordered are listed, but only abnormal results are displayed) Results for orders placed or performed during the hospital encounter of 07/08/19  SARS Coronavirus 2 [facility] order, Performed in [facility] hospital lab) Nasopharyngeal Nasopharyngeal Swab   Specimen: Nasopharyngeal Swab  Result Value Ref Range   SARS Coronavirus 2 NEGATIVE NEGATIVE  CBC  Result Value Ref Range   WBC 33.7 (H) 4.0 - 10.5 K/uL   RBC 2.42 (L) 3.87 - 5.11 MIL/uL   Hemoglobin 8.7 (L) 12.0 - 15.0 g/dL   HCT 24.3 (L) 36.0 - 46.0 %   MCV 100.4 (H) 80.0 - 100.0 fL   MCH 36.0 (H) 26.0 - 34.0 pg   MCHC 35.8 30.0 - 36.0 g/dL   RDW 18.4 (H) 11.5 - 15.5 %   Platelets 636 (H) 150 - 400 K/uL   nRBC 0.7 (H) 0.0 - 0.2 %  Comprehensive metabolic panel  Result Value Ref Range   Sodium 136 135 - 145 mmol/L   Potassium 3.5 3.5 - 5.1 mmol/L   Chloride 101 98 - 111 mmol/L   CO2 23 22 - 32 mmol/L   Glucose, Bld 115 (H) 70 - 99 mg/dL   BUN 9 6 - 20 mg/dL   Creatinine, Ser 0.88 0.44 - 1.00 mg/dL   Calcium 9.2 8.9 - 10.3 mg/dL   Total Protein 8.9 (H) 6.5 - 8.1 g/dL   Albumin 4.8 3.5 - 5.0 g/dL   AST 18 15 - 41 U/L   ALT 22 0 - 44 U/L   Alkaline Phosphatase 71 38 - 126 U/L   Total Bilirubin 6.5 (H) 0.3 - 1.2 mg/dL   GFR calc non Af Amer >60 >60 mL/min   GFR calc Af Amer >60 >60 mL/min   Anion gap 12 5 - 15  Rapid urine drug screen (hospital performed)  Result Value Ref Range   Opiates NONE DETECTED NONE DETECTED   Cocaine POSITIVE (A) NONE DETECTED   Benzodiazepines NONE DETECTED NONE DETECTED   Amphetamines NONE DETECTED  NONE DETECTED   Tetrahydrocannabinol NONE DETECTED NONE DETECTED   Barbiturates NONE DETECTED NONE DETECTED  Reticulocytes  Result Value Ref Range   Retic Ct Pct 15.1 (H) 0.4 - 3.1 %   RBC. 2.42 (L) 3.87 - 5.11 MIL/uL   Retic Count, Absolute 400.5 (H) 19.0 - 186.0 K/uL   Immature Retic Fract 30.6 (H) 2.3 - 15.9 %   Dg Chest 2 View  Result Date: 06/08/2019 CLINICAL DATA:  Syncope EXAM: CHEST - 2 VIEW COMPARISON:  Mar 01, 2019 FINDINGS: Heart size is enlarged. There are prominent interstitial lung markings bilaterally with bibasilar airspace opacities favored to represent a combination of atelectasis and trace bilateral pleural effusions. There is no acute osseous abnormality. There is some elevation of the left hemidiaphragm. IMPRESSION: 1. Cardiomegaly with findings concerning for vascular congestion and possible developing pulmonary edema. 2. Bibasilar atelectasis. Possible trace left-sided pleural effusion. 3. No pneumothorax. Electronically Signed   By: Constance Holster M.D.   On: 06/08/2019 17:44   Ct Head Wo Contrast  Result Date: 06/08/2019 CLINICAL DATA:  Altered level of consciousness. EXAM: CT HEAD WITHOUT CONTRAST TECHNIQUE: Contiguous axial images were obtained from the base of the skull through the vertex without intravenous contrast. COMPARISON:  Mar 01, 2019 FINDINGS: Brain: No evidence of acute infarction, hemorrhage, hydrocephalus, extra-axial collection or mass lesion/mass effect. Vascular: No hyperdense vessel or unexpected calcification. Skull: Normal. Negative for fracture or focal lesion. Sinuses/Orbits: No acute finding. Other: None.  IMPRESSION: No acute intracranial abnormality. Electronically Signed   By: Fidela Salisbury M.D.   On: 06/08/2019 18:05   Dg Chest Port 1 View  Result Date: 07/08/2019 CLINICAL DATA:  23 year old female with sickle cell disease and history of asthma presents with shortness of breath, cough and chest wall pain. EXAM: PORTABLE CHEST 1 VIEW  COMPARISON:  Prior chest x-ray 06/08/2019 FINDINGS: Slightly increased indistinct airspace opacity in the medial right middle lobe partially obscuring the cardiac margin. Otherwise, stable bronchitic changes and mild hyperinflation. No pulmonary edema, pleural effusion or pneumothorax. No acute osseous abnormality. IMPRESSION: Interval development of patchy airspace opacity in the medial aspect of the right middle lobe. Differential considerations include pneumonia, atelectasis, and, in the setting of sickle cell disease, veno occlusive disease. Electronically Signed   By: Jacqulynn Cadet M.D.   On: 07/08/2019 10:20    EKG None  Radiology Dg Chest Port 1 View  Result Date: 07/08/2019 CLINICAL DATA:  23 year old female with sickle cell disease and history of asthma presents with shortness of breath, cough and chest wall pain. EXAM: PORTABLE CHEST 1 VIEW COMPARISON:  Prior chest x-ray 06/08/2019 FINDINGS: Slightly increased indistinct airspace opacity in the medial right middle lobe partially obscuring the cardiac margin. Otherwise, stable bronchitic changes and mild hyperinflation. No pulmonary edema, pleural effusion or pneumothorax. No acute osseous abnormality. IMPRESSION: Interval development of patchy airspace opacity in the medial aspect of the right middle lobe. Differential considerations include pneumonia, atelectasis, and, in the setting of sickle cell disease, veno occlusive disease. Electronically Signed   By: Jacqulynn Cadet M.D.   On: 07/08/2019 10:20    Procedures Procedures (including critical care time)  Medications Ordered in ED Medications  sodium chloride 0.9 % bolus 500 mL (has no administration in time range)     Initial Impression / Assessment and Plan / ED Course  I have reviewed the triage vital signs and the nursing notes.  Pertinent labs & imaging results that were available during my care of the patient were reviewed by me and considered in my medical decision  making (see chart for details).  Iv ns. Labs sent.  Reviewed nursing notes and prior charts for additional history.   Dilaudid 1 mg iv. zofran iv.   CXR reviewed by me - right infiltrate, possible pna. Cultures added, lactate added. Iv antibiotics given.   Labs reviewed by me - wbc markedly elevated.   Recheck, pain improved but persists. Dilaudid iv.   Given chest infiltrate, elev wbc, possible pna, vs acute chest - will admit.   Sickle cell team consulted for admission.     Final Clinical Impressions(s) / ED Diagnoses   Final diagnoses:  None    ED Discharge Orders    None       Lajean Saver, MD 07/08/19 1338

## 2019-07-08 NOTE — Progress Notes (Signed)
Received report from Mandy,RN.

## 2019-07-09 LAB — URINE CULTURE: Culture: >=100000 — AB

## 2019-07-09 LAB — HIV ANTIBODY (ROUTINE TESTING W REFLEX): HIV Screen 4th Generation wRfx: NONREACTIVE

## 2019-07-10 ENCOUNTER — Inpatient Hospital Stay (HOSPITAL_COMMUNITY)
Admission: EM | Admit: 2019-07-10 | Discharge: 2019-07-13 | DRG: 811 | Disposition: A | Discharge status: Home / Self Care | Payer: Medicaid Other | Attending: Internal Medicine | Admitting: Internal Medicine

## 2019-07-10 ENCOUNTER — Emergency Department (HOSPITAL_COMMUNITY): Payer: Medicaid Other

## 2019-07-10 ENCOUNTER — Encounter (HOSPITAL_COMMUNITY): Payer: Self-pay | Admitting: Emergency Medicine

## 2019-07-10 ENCOUNTER — Other Ambulatory Visit: Payer: Self-pay

## 2019-07-10 DIAGNOSIS — Z20828 Contact with and (suspected) exposure to other viral communicable diseases: Secondary | ICD-10-CM | POA: Diagnosis present

## 2019-07-10 DIAGNOSIS — J189 Pneumonia, unspecified organism: Secondary | ICD-10-CM | POA: Diagnosis present

## 2019-07-10 DIAGNOSIS — Z5900 Homelessness unspecified: Secondary | ICD-10-CM

## 2019-07-10 DIAGNOSIS — Z832 Family history of diseases of the blood and blood-forming organs and certain disorders involving the immune mechanism: Secondary | ICD-10-CM

## 2019-07-10 DIAGNOSIS — F1910 Other psychoactive substance abuse, uncomplicated: Secondary | ICD-10-CM | POA: Diagnosis present

## 2019-07-10 DIAGNOSIS — F411 Generalized anxiety disorder: Secondary | ICD-10-CM | POA: Diagnosis present

## 2019-07-10 DIAGNOSIS — Z79899 Other long term (current) drug therapy: Secondary | ICD-10-CM | POA: Diagnosis not present

## 2019-07-10 DIAGNOSIS — G894 Chronic pain syndrome: Secondary | ICD-10-CM | POA: Diagnosis present

## 2019-07-10 DIAGNOSIS — K219 Gastro-esophageal reflux disease without esophagitis: Secondary | ICD-10-CM | POA: Diagnosis present

## 2019-07-10 DIAGNOSIS — F1414 Cocaine abuse with cocaine-induced mood disorder: Secondary | ICD-10-CM | POA: Diagnosis present

## 2019-07-10 DIAGNOSIS — Z9081 Acquired absence of spleen: Secondary | ICD-10-CM

## 2019-07-10 DIAGNOSIS — F17200 Nicotine dependence, unspecified, uncomplicated: Secondary | ICD-10-CM | POA: Diagnosis present

## 2019-07-10 DIAGNOSIS — Z8249 Family history of ischemic heart disease and other diseases of the circulatory system: Secondary | ICD-10-CM

## 2019-07-10 DIAGNOSIS — J45909 Unspecified asthma, uncomplicated: Secondary | ICD-10-CM | POA: Diagnosis present

## 2019-07-10 DIAGNOSIS — F1494 Cocaine use, unspecified with cocaine-induced mood disorder: Secondary | ICD-10-CM | POA: Diagnosis present

## 2019-07-10 DIAGNOSIS — D638 Anemia in other chronic diseases classified elsewhere: Secondary | ICD-10-CM | POA: Diagnosis present

## 2019-07-10 DIAGNOSIS — D5700 Hb-SS disease with crisis, unspecified: Principal | ICD-10-CM | POA: Diagnosis present

## 2019-07-10 DIAGNOSIS — F1410 Cocaine abuse, uncomplicated: Secondary | ICD-10-CM | POA: Diagnosis present

## 2019-07-10 DIAGNOSIS — D649 Anemia, unspecified: Secondary | ICD-10-CM

## 2019-07-10 DIAGNOSIS — D72829 Elevated white blood cell count, unspecified: Secondary | ICD-10-CM | POA: Diagnosis present

## 2019-07-10 LAB — CBC WITH DIFFERENTIAL/PLATELET
Abs Immature Granulocytes: 0.11 10*3/uL — ABNORMAL HIGH (ref 0.00–0.07)
Basophils Absolute: 0 10*3/uL (ref 0.0–0.1)
Basophils Relative: 0 %
Eosinophils Absolute: 0.1 10*3/uL (ref 0.0–0.5)
Eosinophils Relative: 0 %
HCT: 17.1 % — ABNORMAL LOW (ref 36.0–46.0)
Hemoglobin: 6.3 g/dL — CL (ref 12.0–15.0)
Immature Granulocytes: 1 %
Lymphocytes Relative: 14 %
Lymphs Abs: 2.4 10*3/uL (ref 0.7–4.0)
MCH: 35 pg — ABNORMAL HIGH (ref 26.0–34.0)
MCHC: 36.8 g/dL — ABNORMAL HIGH (ref 30.0–36.0)
MCV: 95 fL (ref 80.0–100.0)
Monocytes Absolute: 1.6 10*3/uL — ABNORMAL HIGH (ref 0.1–1.0)
Monocytes Relative: 9 %
Neutro Abs: 12.9 10*3/uL — ABNORMAL HIGH (ref 1.7–7.7)
Neutrophils Relative %: 76 %
Platelets: 563 10*3/uL — ABNORMAL HIGH (ref 150–400)
RBC: 1.8 MIL/uL — ABNORMAL LOW (ref 3.87–5.11)
RDW: 17.5 % — ABNORMAL HIGH (ref 11.5–15.5)
WBC: 17 10*3/uL — ABNORMAL HIGH (ref 4.0–10.5)
nRBC: 0.6 % — ABNORMAL HIGH (ref 0.0–0.2)

## 2019-07-10 LAB — COMPREHENSIVE METABOLIC PANEL
ALT: 29 U/L (ref 0–44)
AST: 29 U/L (ref 15–41)
Albumin: 3.6 g/dL (ref 3.5–5.0)
Alkaline Phosphatase: 58 U/L (ref 38–126)
Anion gap: 12 (ref 5–15)
BUN: 8 mg/dL (ref 6–20)
CO2: 20 mmol/L — ABNORMAL LOW (ref 22–32)
Calcium: 9.1 mg/dL (ref 8.9–10.3)
Chloride: 109 mmol/L (ref 98–111)
Creatinine, Ser: 0.83 mg/dL (ref 0.44–1.00)
GFR calc Af Amer: >60 mL/min (ref >60)
GFR calc non Af Amer: >60 mL/min (ref >60)
Glucose, Bld: 91 mg/dL (ref 70–99)
Potassium: 5 mmol/L (ref 3.5–5.1)
Sodium: 141 mmol/L (ref 135–145)
Total Bilirubin: 3.7 mg/dL — ABNORMAL HIGH (ref 0.3–1.2)
Total Protein: 7.6 g/dL (ref 6.5–8.1)

## 2019-07-10 LAB — PREPARE RBC (CROSSMATCH)

## 2019-07-10 LAB — RETICULOCYTES
Immature Retic Fract: 29.1 % — ABNORMAL HIGH (ref 2.3–15.9)
RBC.: 1.8 MIL/uL — ABNORMAL LOW (ref 3.87–5.11)
Retic Count, Absolute: 224.3 10*3/uL — ABNORMAL HIGH (ref 19.0–186.0)
Retic Ct Pct: 12.5 % — ABNORMAL HIGH (ref 0.4–3.1)

## 2019-07-10 LAB — SARS CORONAVIRUS 2 (TAT 6-24 HRS): SARS Coronavirus 2: NEGATIVE

## 2019-07-10 MED ORDER — POLYETHYLENE GLYCOL 3350 17 G PO PACK: 17.0000 g | PACK | Freq: Every day | ORAL | Status: DC | PRN

## 2019-07-10 MED ORDER — SENNOSIDES-DOCUSATE SODIUM 8.6-50 MG PO TABS
1.0000 | ORAL_TABLET | Freq: Two times a day (BID) | ORAL | Status: DC
  Administered 2019-07-10 – 2019-07-13 (×6): 1 via ORAL
  Filled 2019-07-10 (×7): qty 1

## 2019-07-10 MED ORDER — KETOROLAC TROMETHAMINE 15 MG/ML IJ SOLN
15.0000 mg | Freq: Four times a day (QID) | INTRAMUSCULAR | Status: DC
  Administered 2019-07-10 – 2019-07-13 (×12): 15 mg via INTRAVENOUS
  Filled 2019-07-10 (×12): qty 1

## 2019-07-10 MED ORDER — SODIUM CHLORIDE 0.9% IV SOLUTION: Freq: Once | INTRAVENOUS | Status: DC

## 2019-07-10 MED ORDER — SODIUM CHLORIDE 0.9 % IV SOLN
500.0000 mg | INTRAVENOUS | Status: DC
  Administered 2019-07-10 – 2019-07-12 (×3): 500 mg via INTRAVENOUS
  Filled 2019-07-10 (×5): qty 500

## 2019-07-10 MED ORDER — OXYCODONE HCL 5 MG PO TABS: 10.0000 mg | ORAL_TABLET | ORAL | Status: DC

## 2019-07-10 MED ORDER — OXYCODONE HCL 5 MG PO TABS
10.0000 mg | ORAL_TABLET | ORAL | Status: DC
  Administered 2019-07-10 – 2019-07-13 (×12): 10 mg via ORAL
  Filled 2019-07-10 (×13): qty 2

## 2019-07-10 MED ORDER — DIPHENHYDRAMINE HCL 25 MG PO CAPS
25.0000 mg | ORAL_CAPSULE | Freq: Once | ORAL | Status: AC
  Administered 2019-07-10: 25 mg via ORAL
  Filled 2019-07-10: qty 1

## 2019-07-10 MED ORDER — SODIUM CHLORIDE 0.9% FLUSH
3.0000 mL | Freq: Once | INTRAVENOUS | Status: AC
  Administered 2019-07-10: 3 mL via INTRAVENOUS

## 2019-07-10 MED ORDER — HYDROMORPHONE HCL 2 MG PO TABS
4.0000 mg | ORAL_TABLET | Freq: Once | ORAL | Status: AC
  Administered 2019-07-10: 4 mg via ORAL
  Filled 2019-07-10: qty 2

## 2019-07-10 MED ORDER — ENOXAPARIN SODIUM 40 MG/0.4ML ~~LOC~~ SOLN
40.0000 mg | SUBCUTANEOUS | Status: DC
  Filled 2019-07-10: qty 0.4

## 2019-07-10 MED ORDER — ACETAMINOPHEN 325 MG PO TABS
650.0000 mg | ORAL_TABLET | ORAL | Status: DC | PRN
  Administered 2019-07-10: 650 mg via ORAL
  Filled 2019-07-10: qty 2

## 2019-07-10 MED ORDER — GABAPENTIN 100 MG PO CAPS
100.0000 mg | ORAL_CAPSULE | Freq: Three times a day (TID) | ORAL | Status: DC
  Administered 2019-07-10 – 2019-07-13 (×9): 100 mg via ORAL
  Filled 2019-07-10 (×9): qty 1

## 2019-07-10 MED ORDER — SODIUM CHLORIDE 0.9 % IV SOLN
2.0000 g | INTRAVENOUS | Status: DC
  Administered 2019-07-10 – 2019-07-12 (×3): 2 g via INTRAVENOUS
  Filled 2019-07-10 (×2): qty 20
  Filled 2019-07-10 (×2): qty 2

## 2019-07-10 MED ORDER — FOLIC ACID 1 MG PO TABS
1.0000 mg | ORAL_TABLET | Freq: Every day | ORAL | Status: DC
  Administered 2019-07-10 – 2019-07-13 (×4): 1 mg via ORAL
  Filled 2019-07-10 (×4): qty 1

## 2019-07-10 NOTE — ED Triage Notes (Signed)
Patient complaining of sickle cell pain. Patient states she hurts all over. Patient states her home medication is not working.

## 2019-07-10 NOTE — ED Notes (Signed)
Lab Phlebotomy called for blood collection after failed attempts x 2 staff members.

## 2019-07-10 NOTE — H&P (Addendum)
23 year old H&P  [name], is a 23 y.o. female  MRN: [id number]   DOB - Jun 13, 1996  Admit Date - 07/10/2019  Outpatient Primary MD for the patient is Cynthia Dew, FNP  Chief Complaint  Patient presents with  . Sickle Cell Pain Crisis      HPI:   [name]  is a 23 y.o. female with a medical history significant for sickle cell disease, chronic pain syndrome, history of cocaine abuse, and history of generalized anxiety disorder presented to ER complaining of increased generalized pain and shortness of breath.  Patient was admitted to inpatient services on 07/08/2019 for community-acquired pneumonia versus acute chest syndrome.  Patient left AGAINST MEDICAL ADVICE several hours post admission.  Today she returns with worsening shortness of breath, mid chest pain, pain to upper and lower extremities, and low back.  She states that her pain intensity is 10/10, constant, and throbbing.  Patient endorses a persistent cough.  She denies sick contacts, recent travel, or exposure COVID-19.  Patient states that she had a fever at home, she felt hot but did not check temperature.  She denies headache, paresthesias, dizziness, dysuria, nausea, vomiting, or diarrhea.  ER course:  Chest xray shows mild bibasilar atelectasis/airspace disease which may reflect pneumonia and/or acute chest syndrome.  WBCs 17,000, which is decreased from 33,000 on 07/08/2019.  Hemoglobin decreased to 6.3.  Patient received 1 unit of packed red blood cells while in ER.   her pain persists despite Dilaudid 4 mg by mouth, IV fluids, and IV Toradol.  Patient admitted to [facility] for management of sickle cell pain crisis and community-acquired pneumonia versus acute chest syndrome  Review of systems:  Review of Systems  Constitutional: Positive for malaise/fatigue. Negative for chills and fever.  HENT: Negative for congestion.   Respiratory: Positive for cough, sputum production and  shortness of breath.   Cardiovascular: Positive for chest pain.  Gastrointestinal: Negative.   Genitourinary: Negative.   Musculoskeletal: Positive for back pain and joint pain.  Neurological: Negative.   Psychiatric/Behavioral: Negative.    A full 10 point Review of Systems was done, except as stated above, all other Review of Systems were negative.  With Past History of the following :   Past Medical History:  Diagnosis Date  . Acute kidney injury ([place]) 05/15/2016  . Amenorrhea   . Anemia    SICKLE CELL  . Asthma   . Drug-induced pruritus 02/26/2015  . GERD (gastroesophageal reflux disease)   . Headache   . Hyperbilirubinemia 02/26/2015  . Sickle cell disease ([place])       Past Surgical History:  Procedure Laterality Date  . 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 TONSILLECTOMY     Age 31     Social History:   Social History   Tobacco Use  . Smoking status: Current Every Day Smoker    Packs/day: 0.50    Years: 10.00    Pack years: 5.00    Types: Cigarettes  . Smokeless tobacco: Never Used  Substance Use Topics  . Alcohol use: Not Currently    Alcohol/week: 6.0 standard drinks    Types: 6 Shots of liquor per week     Lives - At home   Family History :   Family History  Problem Relation Age of Onset  . Hypertension Paternal Grandfather   . Sickle cell trait Father   . Cancer Mother        Died in  2010     Home Medications:   Prior to Admission medications   Medication Sig Start Date End Date Taking? Authorizing Provider  folic acid (FOLVITE) 1 MG tablet Take 1 tablet (1 mg total) by mouth daily. 03/29/19   Cynthia Dew, FNP  gabapentin (NEURONTIN) 100 MG capsule Take 1 capsule (100 mg total) by mouth 3 (three) times daily. 03/28/19   Cynthia Dew, FNP  ibuprofen (ADVIL) 800 MG tablet Take 1 tablet (800 mg total) by mouth every 8 (eight) hours as needed. Patient not taking: Reported on 07/08/2019 03/28/19   Cynthia Dew, FNP  ondansetron  (ZOFRAN) 4 MG tablet Take 1 tablet (4 mg total) by mouth every 8 (eight) hours as needed for nausea. Patient not taking: Reported on 07/08/2019 03/28/19   Cynthia Dew, FNP  oxyCODONE-acetaminophen (PERCOCET) 5-325 MG tablet Take 1 tablet by mouth every 6 (six) hours as needed for severe pain. Patient not taking: Reported on 07/08/2019 03/28/19 03/27/20  Cynthia Dew, FNP     Allergies:   No Known Allergies   Physical Exam:   Vitals:   Vitals:   07/10/19 0838 07/10/19 0845  BP: (!) 95/52   Pulse:  (!) 105  Resp:    Temp:    SpO2:  99%   Physical Exam Constitutional:      Appearance: Normal appearance.  HENT:     Mouth/Throat:     Mouth: Mucous membranes are moist.  Eyes:     General: No scleral icterus.    Pupils: Pupils are equal, round, and reactive to light.  Cardiovascular:     Rate and Rhythm: Normal rate and regular rhythm.     Pulses: Normal pulses.  Pulmonary:     Breath sounds: Examination of the right-upper field reveals rhonchi. Examination of the left-upper field reveals rhonchi. Examination of the right-lower field reveals rhonchi. Examination of the left-lower field reveals rhonchi. Rhonchi present. No decreased breath sounds.  Abdominal:     General: Abdomen is flat. Bowel sounds are normal.  Skin:    General: Skin is warm.  Neurological:     General: No focal deficit present.     Mental Status: She is alert and oriented to person, place, and time. Mental status is at baseline.  Psychiatric:        Mood and Affect: Mood normal.        Behavior: Behavior normal.        Thought Content: Thought content normal.        Judgment: Judgment normal.     Data Review:   CBC Recent Labs  Lab 07/08/19 1243 07/10/19 0909  WBC 33.7* 17.0*  HGB 8.7* 6.3*  HCT 24.3* 17.1*  PLT 636* 563*  MCV 100.4* 95.0  MCH 36.0* 35.0*  MCHC 35.8 36.8*  RDW 18.4* 17.5*  LYMPHSABS  --  2.4  MONOABS  --  1.6*  EOSABS  --  0.1  BASOSABS  --  0.0    ------------------------------------------------------------------------------------------------------------------  Chemistries  Recent Labs  Lab 07/08/19 1243 07/08/19 1953 07/10/19 0909  NA 136  --  141  K 3.5  --  5.0  CL 101  --  109  CO2 23  --  20*  GLUCOSE 115*  --  91  BUN 9  --  8  CREATININE 0.88 0.90 0.83  CALCIUM 9.2  --  9.1  AST 18  --  29  ALT 22  --  29  ALKPHOS 71  --  58  BILITOT 6.5*  --  3.7*   ------------------------------------------------------------------------------------------------------------------ estimated creatinine clearance is 105.4 mL/min (by C-G formula based on SCr of 0.83 mg/dL). ------------------------------------------------------------------------------------------------------------------ No results for input(s): TSH, T4TOTAL, T3FREE, THYROIDAB in the last 72 hours.  Invalid input(s): FREET3  Coagulation profile No results for input(s): INR, PROTIME in the last 168 hours. ------------------------------------------------------------------------------------------------------------------- No results for input(s): DDIMER in the last 72 hours. -------------------------------------------------------------------------------------------------------------------  Cardiac Enzymes No results for input(s): CKMB, TROPONINI, MYOGLOBIN in the last 168 hours.  Invalid input(s): CK ------------------------------------------------------------------------------------------------------------------ No results found for: BNP  ---------------------------------------------------------------------------------------------------------------  Urinalysis    Component Value Date/Time   COLORURINE YELLOW 07/08/2019 0947   APPEARANCEUR HAZY (A) 07/08/2019 0947   LABSPEC 1.006 07/08/2019 0947   PHURINE 5.0 07/08/2019 0947   GLUCOSEU NEGATIVE 07/08/2019 0947   HGBUR LARGE (A) 07/08/2019 [place] NEGATIVE 07/08/2019 [place]  07/08/2019 0947   PROTEINUR NEGATIVE 07/08/2019 0947   UROBILINOGEN 1.0 08/11/2015 1948   NITRITE NEGATIVE 07/08/2019 0947   LEUKOCYTESUR SMALL (A) 07/08/2019 0947    ----------------------------------------------------------------------------------------------------------------   Imaging Results:    No results found.    Assessment & Plan:  Principal Problem:   Sickle cell pain crisis (HCC) Active Problems:   Leukocytosis   Homelessness   Substance abuse (HCC)   Generalized anxiety disorder   Cocaine abuse ([place])   Cocaine-induced mood disorder (HCC)  Sickle cell disease with pain crisis:  Admit, continue IV fluids 0.45% saline at 100 ml/hr Previous urine drug screen was positive for cocaine (07/08/2019).  Refrain from IV pain medications Oxycodone 10 mg every 4 hours for moderate to severe pain  IV Toradol 15 every 6 hours 2 L of supplemental oxygen Patient will be reevaluated for pain in the context of functioning and relationship to baseline as her care progresses  CAP vs acute chest syndrome:  WBCs 17, 000, which is decreased from previous.  Continue broad-spectrum antibiotics. Follow CBC in a.m.  Sickle cell anemia: On admission, hemoglobin decreased to 6.2.  Patient transfused 1 unit of PRBCs.  Repeat CBC in a.m. continue to monitor closely. Continue folic acid 1 mg daily for bone marrow support  Cocaine induced mood disorder:   Patient continues to deny suicidal or homicidal ideations. She also denies visual or auditory hallucinations.  Patient is not on anti-anxiety medications at this time.  Continue to monitor closely.  She may warrant psychiatric consult while inpatient. Social work consult pending    DVT Prophylaxis: Subcut Lovenox and SCDs  AM Labs Ordered, also please review Full Orders  Family Communication: Admission, patient's condition and plan of care including tests being ordered have been discussed with the patient who indicate understanding and  agree with the plan and Code Status.  Code Status: Full Code  Consults called: None    Admission status: Inpatient    Time spent in minutes : 50 minutes  [place], MSN, FNP-C Patient [facility] [address] [place], [place] [address] [phone]  07/10/2019 at 10:22 AM

## 2019-07-10 NOTE — ED Notes (Signed)
Report given to [facility] on floor. Advised of need for RBC transfusion.

## 2019-07-10 NOTE — ED Provider Notes (Signed)
[facility] DEPT Provider Note   CSN: [id number] Arrival date & time: 07/10/19  0720     History   Chief Complaint Chief Complaint  Patient presents with   Sickle Cell Pain Crisis    HPI [name] is a 23 y.o. female.     23 y.o female with a PMH of AKI, sickle cell crisis, CAP presents tot he ED with a chief complaint pain all over.  Patient was released from the hospital 2 days ago, she reports she has taken her home medication without improvement in symptoms.  I have reviewed patient's previous admission, see she has not had any pain medication at home.  According to her chart there has been no prescription given to her in the past for pain control.  She did state to the nurse that she had taken her pain medication today, she denied this to me and said last time she took pain medication was yesterday.  She does endorse chest pain, reports more so on the left side, does endorse some shortness of breath and has a productive cough during my evaluation.  States most of her pain is generalized along with on her upper and lower extremities, she is a very poor historian.  Denies any fevers, abdominal pain, headaches.  The history is provided by the patient and medical records.  Sickle Cell Pain Crisis Associated symptoms: chest pain, cough and shortness of breath   Associated symptoms: no fever, no sore throat and no vomiting     Past Medical History:  Diagnosis Date   Acute kidney injury ([place]) 05/15/2016   Amenorrhea    Anemia    SICKLE CELL   Asthma    Drug-induced pruritus 02/26/2015   GERD (gastroesophageal reflux disease)    Headache    Hyperbilirubinemia 02/26/2015   Sickle cell disease ([place])     Patient Active Problem List   Diagnosis Date Noted   Community acquired pneumonia 07/08/2019   Cocaine-induced mood disorder ([place])    Generalized anxiety disorder    Cocaine abuse ([place])    Elevated liver enzymes  05/17/2018   Sickle cell anemia ([place]) 05/16/2018   Sickle cell crisis ([place]) 05/16/2018   Sickle cell anemia with crisis ([place]) 12/16/2017   Substance abuse ([place]) 12/16/2017   Homelessness 02/12/2017   Protein-calorie malnutrition, severe 10/21/2016   Anemia of chronic disease    Hb-SS disease without crisis ([place])    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      Home Medications    Prior to Admission medications   Medication Sig Start Date End Date Taking? Authorizing Provider  folic acid (FOLVITE) 1 MG tablet Take 1 tablet (1 mg total) by mouth daily. 03/29/19   Dorena Dew, FNP  gabapentin (NEURONTIN) 100 MG capsule Take 1 capsule (100 mg total) by mouth 3 (three) times daily. 03/28/19   Dorena Dew, FNP  ibuprofen (ADVIL) 800 MG tablet Take 1 tablet (800 mg total) by mouth every 8 (eight) hours as needed. Patient not  taking: Reported on 07/08/2019 03/28/19   Dorena Dew, FNP  ondansetron (ZOFRAN) 4 MG tablet Take 1 tablet (4 mg total) by mouth every 8 (eight) hours as needed for nausea. Patient not taking: Reported on 07/08/2019 03/28/19   Dorena Dew, FNP  oxyCODONE-acetaminophen (PERCOCET) 5-325 MG tablet Take 1 tablet by mouth every 6 (six) hours as needed for severe pain. Patient not taking: Reported on 07/08/2019 03/28/19 03/27/20  Dorena Dew, FNP    Family History Family History  Problem Relation Age of Onset   Hypertension Paternal Grandfather    Sickle cell trait Father    Cancer Mother        Died in 02-06-09    Social History Social History   Tobacco  Use   Smoking status: Current Every Day Smoker    Packs/day: 0.50    Years: 10.00    Pack years: 5.00    Types: Cigarettes   Smokeless tobacco: Never Used  Substance Use Topics   Alcohol use: Not Currently    Alcohol/week: 6.0 standard drinks    Types: 6 Shots of liquor per week   Drug use: Yes    Types: Marijuana, Cocaine     Allergies   Patient has no known allergies.   Review of Systems Review of Systems  Constitutional: Negative for chills and fever.  HENT: Negative for ear pain and sore throat.   Eyes: Negative for pain and visual disturbance.  Respiratory: Positive for cough and shortness of breath.   Cardiovascular: Positive for chest pain. Negative for palpitations.  Gastrointestinal: Negative for abdominal pain and vomiting.  Genitourinary: Negative for dysuria and hematuria.  Musculoskeletal: Negative for arthralgias and back pain.  Skin: Negative for color change and rash.  Neurological: Negative for seizures and syncope.  All other systems reviewed and are negative.    Physical Exam Updated Vital Signs BP (!) 95/52    Pulse (!) 105    Temp 98.7 F (37.1 C) (Oral)    Resp 16    Ht 5\' 8"  (1.727 m)    Wt 65 kg    SpO2 99%    BMI 21.79 kg/m   Physical Exam Vitals signs and nursing note reviewed.  Constitutional:      Appearance: Normal appearance.     Comments: Very difficult to arouse.  HENT:     Head: Normocephalic and atraumatic.     Nose: No congestion or rhinorrhea.     Mouth/Throat:     Pharynx: Posterior oropharyngeal erythema present.  Eyes:     Pupils: Pupils are equal, round, and reactive to light.     Comments: Pupils are pinpoint on my evaluation.  Neck:     Musculoskeletal: Normal range of motion and neck supple.  Cardiovascular:     Rate and Rhythm: Normal rate.  Pulmonary:     Breath sounds: Rhonchi present.     Comments: Scattered rhonchi throughout lower lung fields. Abdominal:     Tenderness: There is no right CVA tenderness  or left CVA tenderness.     Comments: Abdomen is soft without any focal point of tenderness.  No rebound, guarding.  Neurological:     Comments: Patient is arousable to pain, seems somewhat sleepy.       ED Treatments / Results  Labs (all labs ordered are listed, but only abnormal results are displayed) Labs Reviewed  COMPREHENSIVE METABOLIC PANEL - Abnormal; Notable for the following components:      Result Value   CO2  20 (*)    Total Bilirubin 3.7 (*)    All other components within normal limits  CBC WITH DIFFERENTIAL/PLATELET - Abnormal; Notable for the following components:   WBC 17.0 (*)    RBC 1.80 (*)    Hemoglobin 6.3 (*)    HCT 17.1 (*)    MCH 35.0 (*)    MCHC 36.8 (*)    RDW 17.5 (*)    Platelets 563 (*)    nRBC 0.6 (*)    Neutro Abs 12.9 (*)    Monocytes Absolute 1.6 (*)    Abs Immature Granulocytes 0.11 (*)    All other components within normal limits  RETICULOCYTES - Abnormal; Notable for the following components:   Retic Ct Pct 12.5 (*)    RBC. 1.80 (*)    Retic Count, Absolute 224.3 (*)    Immature Retic Fract 29.1 (*)    All other components within normal limits  I-STAT BETA HCG BLOOD, ED ([facility], [facility], [facility] ONLY)  TYPE AND SCREEN  PREPARE RBC (CROSSMATCH)    EKG None  Radiology No results found.  Procedures Procedures (including critical care time)  Medications Ordered in ED Medications  0.9 %  sodium chloride infusion (Manually program via Guardrails IV Fluids) (has no administration in time range)  diphenhydrAMINE (BENADRYL) capsule 25 mg (has no administration in time range)  sodium chloride flush (NS) 0.9 % injection 3 mL (3 mLs Intravenous Given 07/10/19 0852)     Initial Impression / Assessment and Plan / ED Course  I have reviewed the triage vital signs and the nursing notes.  Pertinent labs & imaging results that were available during my care of the patient were reviewed by me and considered in my medical decision making (see chart for  details).       Patient with a past medical history of sickle cell disease presents to the ED with complaints of pain all over, according to chart patient was released from the hospital 2 days ago for pain crisis.  Today she reports pain all over, she is a very poor historian and is very hard to arouse during exam, pupils do appear pinpoint, states she did take her pain medication at home however according to the chart patient has not had a prescription for pain medication at home.  She does have a very productive cough during my interview, will obtain chest x-ray to further evaluate her community-acquired pneumonia which she was diagnosed with 2 days ago.  Will obtain labs along with chest xray.  10:08am Spoke to Ms. Cammie Sickle who reported patient left AMA after being admitted into the hospital. Advised her that patient has returned today for worsening pain, her CBC is remarkable for hemoglobin of 6.4, this is dropped significantly since her last visit in the hospital 2 days ago.WBC looks to be trending down.  Patient will likely need transfusion, she will be admitted into the hospitalist service, although she left AMA 2 days ago feel that she requires admission at this time. Vitals are within normal limits but remarkable for soft pressures.   Chest xray showed: Mild bibasilar atelectasis/airspace disease may reflect pneumonia  and/or acute chest syndrome.    11:11 AM Spoke to Cammie Sickle who will admit patient for further manamgnet. Patient is agreeable of staying for treatment.   Portions of this note were generated with [employer]ist. Dictation errors may occur despite best attempts at proofreading.   Final Clinical Impressions(s) / ED Diagnoses   Final diagnoses:  Low hemoglobin  Sickle cell pain crisis [facility])    ED Discharge Orders    None       Janeece Fitting, PA-C 07/10/19 1111    Daleen Bo, MD 07/10/19 1734

## 2019-07-10 NOTE — ED Notes (Signed)
ED TO INPATIENT HANDOFF REPORT  ED Nurse Name and Phone #:  Angelina Neece rn   S Name/Age/Gender [name] 23 y.o. female Room/Bed: WA20/WA20  Code Status   Code Status: Full Code  Home/SNF/Other Home Patient oriented to: self Is this baseline? Yes   Triage Complete: Triage complete  Chief Complaint sickle cell   Triage Note Patient complaining of sickle cell pain. Patient states she hurts all over. Patient states her home medication is not working.    Allergies No Known Allergies  Level of Care/Admitting Diagnosis ED Disposition    ED Disposition Condition Comment   Admit  Hospital Area: [facility] [[id number]]  Level of Care: Med-Surg [16]  Covid Evaluation: Asymptomatic Screening Protocol (No Symptoms)  Diagnosis: [id number]  Admitting Physician: Tresa Garter [id number]  Attending Physician: Tresa Garter [id number]  Estimated length of stay: past midnight tomorrow  Certification:: I certify this patient will need inpatient services for at least 2 midnights  PT Class (Do Not Modify): Inpatient [101]  PT Acc Code (Do Not Modify): Private [1]       B Medical/Surgery History Past Medical History:  Diagnosis Date  . Acute kidney injury ([place]) 05/15/2016  . Amenorrhea   . Anemia    SICKLE CELL  . Asthma   . Drug-induced pruritus 02/26/2015  . GERD (gastroesophageal reflux disease)   . Headache   . Hyperbilirubinemia 02/26/2015  . Sickle cell disease ([place])    Past Surgical History:  Procedure Laterality Date  . 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 TONSILLECTOMY     Age 44     A IV Location/Drains/Wounds Patient Lines/Drains/Airways Status   Active Line/Drains/Airways    Name:   Placement date:   Placement time:   Site:   Days:   Peripheral IV 07/10/19 Right Hand   07/10/19    0843    Hand   less than 1   Peripheral IV 07/10/19 Left;Anterior Forearm   07/10/19    1256    Forearm    less than 1          Intake/Output Last 24 hours  Intake/Output Summary (Last 24 hours) at 07/10/2019 1439 Last data filed at 07/10/2019 [id number] Gross per 24 hour  Intake 3 ml  Output -  Net 3 ml    Labs/Imaging Results for orders placed or performed during the hospital encounter of 07/10/19 (from the past 48 hour(s))  Comprehensive metabolic panel     Status: Abnormal   Collection Time: 07/10/19  9:09 AM  Result Value Ref Range   Sodium 141 135 - 145 mmol/L   Potassium 5.0 3.5 - 5.1 mmol/L    Comment: DELTA CHECK NOTED REPEATED TO VERIFY NO VISIBLE HEMOLYSIS    Chloride 109 98 - 111 mmol/L   CO2 20 (L) 22 - 32 mmol/L   Glucose, Bld 91 70 - 99 mg/dL   BUN 8 6 - 20 mg/dL   Creatinine, Ser 0.83 0.44 - 1.00 mg/dL   Calcium 9.1 8.9 - 10.3 mg/dL   Total Protein 7.6 6.5 - 8.1 g/dL   Albumin 3.6 3.5 - 5.0 g/dL   AST 29 15 - 41 U/L   ALT 29 0 - 44 U/L   Alkaline Phosphatase 58 38 - 126 U/L   Total Bilirubin 3.7 (H) 0.3 - 1.2 mg/dL   GFR calc non Af Amer >60 >60 mL/min   GFR calc Af Amer >60 >  60 mL/min   Anion gap 12 5 - 15    Comment: Performed at [facility], [place] [address] [place], [place] [address]  CBC with Differential     Status: Abnormal   Collection Time: 07/10/19  9:09 AM  Result Value Ref Range   WBC 17.0 (H) 4.0 - 10.5 K/uL   RBC 1.80 (L) 3.87 - 5.11 MIL/uL   Hemoglobin 6.3 (LL) 12.0 - 15.0 g/dL    Comment: This critical result has verified and been called to Nena Alexander by Raelyn Ensign on 09 10 2020 at [phone], and has been read back. CRITICAL RESULTS VERIFIED   HCT 17.1 (L) 36.0 - 46.0 %   MCV 95.0 80.0 - 100.0 fL   MCH 35.0 (H) 26.0 - 34.0 pg   MCHC 36.8 (H) 30.0 - 36.0 g/dL   RDW 17.5 (H) 11.5 - 15.5 %   Platelets 563 (H) 150 - 400 K/uL   nRBC 0.6 (H) 0.0 - 0.2 %   Neutrophils Relative % 76 %   Neutro Abs 12.9 (H) 1.7 - 7.7 K/uL   Lymphocytes Relative 14 %   Lymphs Abs 2.4 0.7 - 4.0 K/uL   Monocytes Relative 9 %   Monocytes  Absolute 1.6 (H) 0.1 - 1.0 K/uL   Eosinophils Relative 0 %   Eosinophils Absolute 0.1 0.0 - 0.5 K/uL   Basophils Relative 0 %   Basophils Absolute 0.0 0.0 - 0.1 K/uL   Immature Granulocytes 1 %   Abs Immature Granulocytes 0.11 (H) 0.00 - 0.07 K/uL    Comment: Performed at [facility], [place] [address], [place], [place] [address]  Reticulocytes     Status: Abnormal   Collection Time: 07/10/19  9:09 AM  Result Value Ref Range   Retic Ct Pct 12.5 (H) 0.4 - 3.1 %   RBC. 1.80 (L) 3.87 - 5.11 MIL/uL   Retic Count, Absolute 224.3 (H) 19.0 - 186.0 K/uL   Immature Retic Fract 29.1 (H) 2.3 - 15.9 %    Comment: Performed at [facility], [place] [address], [place], [place] [address]  Type and screen [place]     Status: None (Preliminary result)   Collection Time: 07/10/19 12:53 PM  Result Value Ref Range   ABO/RH(D) B POS    Antibody Screen NEG    Sample Expiration 07/13/2019,2359    Unit Number [id number]    Blood Component Type RED CELLS,LR    Unit division 00    Status of Unit ALLOCATED    Donor AG Type      NEGATIVE FOR C ANTIGEN NEGATIVE FOR E ANTIGEN NEGATIVE FOR KELL ANTIGEN   Transfusion Status OK TO TRANSFUSE    Crossmatch Result      Compatible Performed at [facility], [place] [address], [place], [place] [address]   Prepare RBC     Status: None   Collection Time: 07/10/19 12:53 PM  Result Value Ref Range   Order Confirmation      ORDER PROCESSED BY BLOOD BANK Performed at [facility], [place] [address] [place], [place] [address]    Dg Chest 2 View  Result Date: 07/10/2019 CLINICAL DATA:  Chest pain. Patient is a smoker who has a history of sickle cell anemia and asthma. EXAM: CHEST - 2 VIEW COMPARISON:  Chest radiograph dated 07/08/2019 and CT chest dated 03/01/2019. FINDINGS: The heart size and mediastinal contours are within normal limits. There is mild bibasilar  atelectasis/airspace disease. The visualized skeletal structures are unremarkable. IMPRESSION: Mild bibasilar atelectasis/airspace disease may reflect pneumonia and/or acute chest syndrome. Electronically Signed   By: Zerita Boers M.D.   On: 07/10/2019 10:59    Pending Labs Unresulted Labs (From admission, onward)    Start     Ordered   07/17/19 0500  Creatinine, serum  (enoxaparin (LOVENOX)    CrCl >/= 30 ml/min)  Weekly,   R    Comments: while on enoxaparin therapy    07/10/19 1438   07/11/19 0500  Comprehensive metabolic panel  Tomorrow morning,   R     07/10/19 1437   07/11/19 0500  CBC with Differential/Platelet  Tomorrow morning,   R     07/10/19 1437   07/10/19 1438  CBC  (enoxaparin (LOVENOX)    CrCl >/= 30 ml/min)  Once,   STAT    Comments: Baseline for enoxaparin therapy IF NOT ALREADY DRAWN.  Notify MD if PLT < 100 K.    07/10/19 1437   07/10/19 1438  Creatinine, serum  (enoxaparin (LOVENOX)    CrCl >/= 30 ml/min)  Once,   STAT    Comments: Baseline for enoxaparin therapy IF NOT ALREADY DRAWN.    07/10/19 1437   07/10/19 1201  SARS CORONAVIRUS 2 (TAT 6-24 HRS) Nasopharyngeal Nasopharyngeal Swab  (Asymptomatic/Tier 2 Patients Labs)  Once,   STAT    Question Answer Comment  Is this test for diagnosis or screening Screening   Symptomatic for COVID-19 as defined by CDC No   Hospitalized for COVID-19 No   Admitted to ICU for COVID-19 No   Previously tested for COVID-19 Yes   Resident in a congregate (group) care setting No   Employed in healthcare setting No   Pregnant No      07/10/19 1204          Vitals/Pain Today's Vitals   07/10/19 0845 07/10/19 1226 07/10/19 1340 07/10/19 1416  BP:  (!) 99/56 117/65   Pulse:  91 87   Resp:  19 17   Temp:   98.8 F (37.1 C)   TempSrc:   Oral   SpO2:  96% 97%   Weight:      Height:      PainSc: 10-Worst pain ever   10-Worst pain ever    Isolation Precautions No active isolations  Medications Medications  0.9 %   sodium chloride infusion (Manually program via Guardrails IV Fluids) (has no administration in time range)  diphenhydrAMINE (BENADRYL) capsule 25 mg (has no administration in time range)  gabapentin (NEURONTIN) capsule 100 mg (has no administration in time range)  folic acid (FOLVITE) tablet 1 mg (has no administration in time range)  acetaminophen (TYLENOL) tablet 650 mg (has no administration in time range)  senna-docusate (Senokot-S) tablet 1 tablet (has no administration in time range)  polyethylene glycol (MIRALAX / GLYCOLAX) packet 17 g (has no administration in time range)  enoxaparin (LOVENOX) injection 40 mg (has no administration in time range)  ketorolac (TORADOL) 15 MG/ML injection 15 mg (has no administration in time range)  azithromycin (ZITHROMAX) 500 mg in sodium chloride 0.9 % 250 mL IVPB (has no administration in time range)  cefTRIAXone (ROCEPHIN) 2 g in sodium chloride 0.9 % 100 mL IVPB (has no administration in time range)  sodium chloride flush (NS) 0.9 % injection 3 mL (3 mLs Intravenous Given 07/10/19 0852)  HYDROmorphone (DILAUDID) tablet 4 mg (4 mg Oral Given 07/10/19 1339)    Mobility walks Low  fall risk   Focused Assessments    R Recommendations: See Admitting Provider Note  Report given to:   Additional Notes:  n/a

## 2019-07-10 NOTE — ED Notes (Signed)
Upon entering the room, patient 's name stated 3 times and writer kept asking "are you awake." Patient did not arouse until writer shook the patient's arm for a few seconds. Patient lethargic. Patient states she took her morning meds this AM.

## 2019-07-11 LAB — COMPREHENSIVE METABOLIC PANEL WITH GFR
ALT: 26 U/L (ref 0–44)
AST: 25 U/L (ref 15–41)
Albumin: 3 g/dL — ABNORMAL LOW (ref 3.5–5.0)
Alkaline Phosphatase: 54 U/L (ref 38–126)
Anion gap: 9 (ref 5–15)
BUN: 14 mg/dL (ref 6–20)
CO2: 24 mmol/L (ref 22–32)
Calcium: 8.6 mg/dL — ABNORMAL LOW (ref 8.9–10.3)
Chloride: 107 mmol/L (ref 98–111)
Creatinine, Ser: 0.83 mg/dL (ref 0.44–1.00)
GFR calc Af Amer: >60 mL/min
GFR calc non Af Amer: >60 mL/min
Glucose, Bld: 99 mg/dL (ref 70–99)
Potassium: 4 mmol/L (ref 3.5–5.1)
Sodium: 140 mmol/L (ref 135–145)
Total Bilirubin: 2.4 mg/dL — ABNORMAL HIGH (ref 0.3–1.2)
Total Protein: 6.4 g/dL — ABNORMAL LOW (ref 6.5–8.1)

## 2019-07-11 LAB — CBC WITH DIFFERENTIAL/PLATELET
Abs Immature Granulocytes: 0.08 10*3/uL — ABNORMAL HIGH (ref 0.00–0.07)
Basophils Absolute: 0 10*3/uL (ref 0.0–0.1)
Basophils Relative: 0 %
Eosinophils Absolute: 0.2 10*3/uL (ref 0.0–0.5)
Eosinophils Relative: 2 %
HCT: 19.2 % — ABNORMAL LOW (ref 36.0–46.0)
Hemoglobin: 6.8 g/dL — CL (ref 12.0–15.0)
Immature Granulocytes: 1 %
Lymphocytes Relative: 35 %
Lymphs Abs: 4.8 10*3/uL — ABNORMAL HIGH (ref 0.7–4.0)
MCH: 32.7 pg (ref 26.0–34.0)
MCHC: 35.4 g/dL (ref 30.0–36.0)
MCV: 92.3 fL (ref 80.0–100.0)
Monocytes Absolute: 1.3 10*3/uL — ABNORMAL HIGH (ref 0.1–1.0)
Monocytes Relative: 10 %
Neutro Abs: 7.4 10*3/uL (ref 1.7–7.7)
Neutrophils Relative %: 52 %
Platelets: 355 10*3/uL (ref 150–400)
RBC: 2.08 MIL/uL — ABNORMAL LOW (ref 3.87–5.11)
RDW: 20.1 % — ABNORMAL HIGH (ref 11.5–15.5)
WBC: 13.8 10*3/uL — ABNORMAL HIGH (ref 4.0–10.5)
nRBC: 1 % — ABNORMAL HIGH (ref 0.0–0.2)

## 2019-07-11 LAB — HEMOGLOBIN AND HEMATOCRIT, BLOOD
HCT: 22.1 % — ABNORMAL LOW (ref 36.0–46.0)
Hemoglobin: 7.7 g/dL — ABNORMAL LOW (ref 12.0–15.0)

## 2019-07-11 LAB — PREPARE RBC (CROSSMATCH)

## 2019-07-11 MED ORDER — DIPHENHYDRAMINE HCL 25 MG PO CAPS
25.0000 mg | ORAL_CAPSULE | Freq: Once | ORAL | Status: AC
  Administered 2019-07-11: 25 mg via ORAL
  Filled 2019-07-11: qty 1

## 2019-07-11 MED ORDER — SODIUM CHLORIDE 0.9 % IV BOLUS
500.0000 mL | Freq: Once | INTRAVENOUS | Status: AC
  Administered 2019-07-11: 500 mL via INTRAVENOUS

## 2019-07-11 MED ORDER — ACETAMINOPHEN 325 MG PO TABS
650.0000 mg | ORAL_TABLET | Freq: Once | ORAL | Status: AC
  Administered 2019-07-11: 650 mg via ORAL
  Filled 2019-07-11: qty 2

## 2019-07-11 MED ORDER — SODIUM CHLORIDE 0.9% IV SOLUTION
Freq: Once | INTRAVENOUS | Status: AC
  Administered 2019-07-11: 09:00:00 via INTRAVENOUS

## 2019-07-11 NOTE — Progress Notes (Signed)
Subjective: [name], a 23 year old female with a medical history significant for sickle cell disease, chronic pain syndrome, history of polysubstance abuse, cocaine induced mood disorder, and history of anemia of chronic disease was admitted for community-acquired pneumonia versus acute chest syndrome in the presence of sickle cell pain crisis.  Patient continues to complain of pain primarily to upper extremities.  She appears to be resting comfortably.  She states that her pain intensity is 7/10.  She characterizes her pain as intermittent and aching.  [name]'s hemoglobin is 6.8 on today.  Patient is status post 1 unit PRBCs on 07/10/2019.  Hemoglobin this a.m. was 6.8.  Patient has been afebrile overnight.  She continues to endorse shortness of breath and mid chest pain.  She denies headache, dizziness, paresthesias, dysuria, nausea, vomiting, or diarrhea.  Objective:  Vital signs in last 24 hours:  Vitals:   07/11/19 0500 07/11/19 0821 07/11/19 0846 07/11/19 1108  BP: (!) 92/56 (!) 84/52 (!) 93/49 (!) 89/50  Pulse: 91 64 71 63  Resp: 15 14 15 18   Temp: 99.1 F (37.3 C) 98.4 F (36.9 C) 97.8 F (36.6 C) 98.4 F (36.9 C)  TempSrc: Oral Oral Oral Oral  SpO2:  93% 94% 96%  Weight: 65.4 kg     Height:        Intake/Output from previous day:   Intake/Output Summary (Last 24 hours) at 07/11/2019 1331 Last data filed at 07/11/2019 1200 Gross per 24 hour  Intake 3076.83 ml  Output -  Net 3076.83 ml    Physical Exam: General: Alert, awake, oriented x3, in no acute distress.  HEENT: [place]/AT PEERL, EOMI Neck: Trachea midline,  no masses, no thyromegal,y no JVD, no carotid bruit OROPHARYNX:  Moist, No exudate/ erythema/lesions.  Heart: Regular rate and rhythm, without murmurs, rubs, gallops, PMI non-displaced, no heaves or thrills on palpation.  Lungs: Clear to auscultation, no wheezing or rhonchi noted. No increased vocal fremitus resonant to percussion  Abdomen: Soft,  nontender, nondistended, positive bowel sounds, no masses no hepatosplenomegaly noted..  Neuro: No focal neurological deficits noted cranial nerves II through XII grossly intact. DTRs 2+ bilaterally upper and lower extremities. Strength 5 out of 5 in bilateral upper and lower extremities. Musculoskeletal: No warm swelling or erythema around joints, no spinal tenderness noted. Psychiatric: Patient alert and oriented x3, good insight and cognition, good recent to remote recall. Lymph node survey: No cervical axillary or inguinal lymphadenopathy noted.  Lab Results:  Basic Metabolic Panel:    Component Value Date/Time   NA 140 07/11/2019 0510   K 4.0 07/11/2019 0510   CL 107 07/11/2019 0510   CO2 24 07/11/2019 0510   BUN 14 07/11/2019 0510   CREATININE 0.83 07/11/2019 0510   GLUCOSE 99 07/11/2019 0510   CALCIUM 8.6 (L) 07/11/2019 0510   CBC:    Component Value Date/Time   WBC 13.8 (H) 07/11/2019 0510   HGB 6.8 (LL) 07/11/2019 0510   HCT 19.2 (L) 07/11/2019 0510   PLT 355 07/11/2019 0510   MCV 92.3 07/11/2019 0510   NEUTROABS 7.4 07/11/2019 0510   LYMPHSABS 4.8 (H) 07/11/2019 0510   MONOABS 1.3 (H) 07/11/2019 0510   EOSABS 0.2 07/11/2019 0510   BASOSABS 0.0 07/11/2019 0510    Recent Results (from the past 240 hour(s))  Urine culture     Status: Abnormal   Collection Time: 07/08/19  9:47 AM   Specimen: Urine, Clean Catch  Result Value Ref Range Status   Specimen Description   Final  URINE, CLEAN CATCH Performed at [facility], [place] [address] [place], [place] [address]    Special Requests   Final    Immunocompromised Performed at [facility], [place] [address] [place], [place] [address]    Culture (A)  Final    >=100,000 COLONIES/mL GROUP B STREP(S.AGALACTIAE)ISOLATED TESTING AGAINST S. AGALACTIAE NOT ROUTINELY PERFORMED DUE TO PREDICTABILITY OF AMP/PEN/VAN SUSCEPTIBILITY. Performed at [facility], [place] [address]  [place], [place] [address]    Report Status 07/09/2019 FINAL  Final  SARS Coronavirus 2 [facility] order, Performed in [facility] hospital lab) Nasopharyngeal Nasopharyngeal Swab     Status: None   Collection Time: 07/08/19 10:46 AM   Specimen: Nasopharyngeal Swab  Result Value Ref Range Status   SARS Coronavirus 2 NEGATIVE NEGATIVE Final    Comment: (NOTE) If result is NEGATIVE SARS-CoV-2 target nucleic acids are NOT DETECTED. The SARS-CoV-2 RNA is generally detectable in upper and lower  respiratory specimens during the acute phase of infection. The lowest  concentration of SARS-CoV-2 viral copies this assay can detect is 250  copies / mL. A negative result does not preclude SARS-CoV-2 infection  and should not be used as the sole basis for treatment or other  patient management decisions.  A negative result may occur with  improper specimen collection / handling, submission of specimen other  than nasopharyngeal swab, presence of viral mutation(s) within the  areas targeted by this assay, and inadequate number of viral copies  (<250 copies / mL). A negative result must be combined with clinical  observations, patient history, and epidemiological information. If result is POSITIVE SARS-CoV-2 target nucleic acids are DETECTED. The SARS-CoV-2 RNA is generally detectable in upper and lower  respiratory specimens dur ing the acute phase of infection.  Positive  results are indicative of active infection with SARS-CoV-2.  Clinical  correlation with patient history and other diagnostic information is  necessary to determine patient infection status.  Positive results do  not rule out bacterial infection or co-infection with other viruses. If result is PRESUMPTIVE POSTIVE SARS-CoV-2 nucleic acids MAY BE PRESENT.   A presumptive positive result was obtained on the submitted specimen  and confirmed on repeat testing.  While 2019 novel coronavirus  (SARS-CoV-2) nucleic acids may be present in  the submitted sample  additional confirmatory testing may be necessary for epidemiological  and / or clinical management purposes  to differentiate between  SARS-CoV-2 and other Sarbecovirus currently known to infect humans.  If clinically indicated additional testing with an alternate test  methodology [id number]) is advised. The SARS-CoV-2 RNA is generally  detectable in upper and lower respiratory sp ecimens during the acute  phase of infection. The expected result is Negative. Fact Sheet for Patients:  StrictlyIdeas.no Fact Sheet for Healthcare Providers: BankingDealers.co.za This test is not yet approved or cleared by the [place] FDA and has been authorized for detection and/or diagnosis of SARS-CoV-2 by FDA under an Emergency Use Authorization (EUA).  This EUA will remain in effect (meaning this test can be used) for the duration of the COVID-19 declaration under Section 564(b)(1) of the Act, 21 U.S.C. section 360bbb-3(b)(1), unless the authorization is terminated or revoked sooner. Performed at [facility], [place] [address], [place], [place] [address]   Blood Culture (routine x 2)     Status: None (Preliminary result)   Collection Time: 07/08/19 12:43 PM   Specimen: BLOOD  Result Value Ref Range Status   Specimen Description  Final    BLOOD BLOOD RIGHT FOREARM Performed at [employer] [address], [place], [place] [address]    Special Requests   Final    BOTTLES DRAWN AEROBIC AND ANAEROBIC Blood Culture results may not be optimal due to an excessive volume of blood received in culture bottles Performed at [place] [address], [place], [place] [address]    Culture   Final    NO GROWTH 3 DAYS Performed at [facility], [place] [address], [place], [place] [address]    Report Status PENDING  Incomplete  Blood Culture (routine x 2)     Status: None  (Preliminary result)   Collection Time: 07/08/19  7:53 PM   Specimen: BLOOD  Result Value Ref Range Status   Specimen Description   Final    BLOOD RIGHT ANTECUBITAL Performed at [place] [address], [place], [place] [address]    Special Requests   Final    BOTTLES DRAWN AEROBIC ONLY Blood Culture adequate volume Performed at [place] [address], [place], [place] [address]    Culture   Final    NO GROWTH 3 DAYS Performed at [facility], [place] [address], [place],  [address]    Report Status PENDING  Incomplete  SARS CORONAVIRUS 2 (TAT 6-24 HRS) Nasopharyngeal Nasopharyngeal Swab     Status: None   Collection Time: 07/10/19  1:01 PM   Specimen: Nasopharyngeal Swab  Result Value Ref Range Status   SARS Coronavirus 2 NEGATIVE NEGATIVE Final    Comment: (NOTE) SARS-CoV-2 target nucleic acids are NOT DETECTED. The SARS-CoV-2 RNA is generally detectable in upper and lower respiratory specimens during the acute phase of infection. Negative results do not preclude SARS-CoV-2 infection, do not rule out co-infections with other pathogens, and should not be used as the sole basis for treatment or other patient management decisions. Negative results must be combined with clinical observations, patient history, and epidemiological information. The expected result is Negative. Fact Sheet for Patients: SugarRoll.be Fact Sheet for Healthcare Providers: https://www.woods-mathews.com/ This test is not yet approved or cleared by the [place] FDA and  has been authorized for detection and/or diagnosis of SARS-CoV-2 by FDA under an Emergency Use Authorization (EUA). This EUA will remain  in effect (meaning this test can be used) for the duration of the COVID-19 declaration under Section 56 4(b)(1) of the Act, 21 U.S.C. section 360bbb-3(b)(1), unless the authorization is terminated  or revoked sooner. Performed at [facility], [place] [address], [place],  [address]     Studies/Results: Dg Chest 2 View  Result Date: 07/10/2019 CLINICAL DATA:  Chest pain. Patient is a smoker who has a history of sickle cell anemia and asthma. EXAM: CHEST - 2 VIEW COMPARISON:  Chest radiograph dated 07/08/2019 and CT chest dated 03/01/2019. FINDINGS: The heart size and mediastinal contours are within normal limits. There is mild bibasilar atelectasis/airspace disease. The visualized skeletal structures are unremarkable. IMPRESSION: Mild bibasilar atelectasis/airspace disease may reflect pneumonia and/or acute chest syndrome. Electronically Signed   By: Zerita Boers M.D.   On: 07/10/2019 10:59    Medications: Scheduled Meds: . sodium chloride   Intravenous Once  . enoxaparin (LOVENOX) injection  40 mg Subcutaneous Q24H  . folic acid  1 mg Oral Daily  . gabapentin  100 mg Oral TID  . ketorolac  15 mg Intravenous Q6H  . oxyCODONE  10 mg Oral Q4H  while awake  . senna-docusate  1 tablet Oral BID   Continuous Infusions: . azithromycin Stopped (07/10/19 1811)  . cefTRIAXone (ROCEPHIN)  IV Stopped (07/10/19 1703)   PRN Meds:.acetaminophen, polyethylene glycol   Antibiotics:  IV azithromycin  IV Rocephin  Assessment/Plan: Principal Problem:   Sickle cell pain crisis (HCC) Active Problems:   Leukocytosis   Anemia of chronic disease   Homelessness   Substance abuse ([place])   Generalized anxiety disorder   Cocaine abuse (HCC)   Cocaine-induced mood disorder (HCC)  Sickle cell disease with pain crisis: Continue IV fluids at 50 mL/h Oxycodone 10 mg every 4 hours while patient is awake Refrain from IV opiates Continue IV Toradol 15 mg every 6 hours 2 L supplemental oxygen as needed  Sickle cell anemia: Patient is s/p 1 unit of PRBCs on admission.  Hemoglobin improved some to 6.8, patient warrants additional unit of packed red blood cells on today.  Repeat CBC in  a.m.  CAP versus acute chest syndrome: WBCs improving.  Patient is afebrile.  Blood cultures negative. Repeat CBC in a.m.  Cocaine-induced mood disorder: Patient denies suicidal homicidal ideations.  She also denies visual or auditory hallucinations.  Continue to monitor closely. Social work consult pending  Code Status: Full Code Family Communication: N/A Disposition Plan: Not yet ready for discharge   University, MSN, FNP-C Patient [place] [address] [place], [place] [address] [phone]  If 5PM-7AM, please contact night-coverage.  07/11/2019, 1:31 PM  LOS: 1 day

## 2019-07-11 NOTE — BH Specialist Note (Signed)
Integrated Behavioral Health Referral Note  Reason for Referral: [name] is a 23 y.o. female  Pt was referred by NP, [place] Hollis for: substance use, homelessness   Pt reports the following concerns: homelessness  Plan: 1. Addressed today: CSW met with patient at bedside in [place]. Patient with significant cough and had difficulty engaging in conversation. Assessed patient's needs. Patient's main concern is housing. She reports she is staying with her father currently, but does not have permanent housing. Patient does have a known history of homelessness. Provided shelter and [place] information and information on [employer]. Housing Authority waiting lists currently closed. Will plan to follow patient as closely as possible. Patient vulnerable due to homelessness and substance use and care team plan is to try to keep patient engaged in care.  2. Referral: [place]  3. Follow up: Will follow from clinic as needed, will provide follow up information about [place] [place] [facility] [phone]

## 2019-07-12 LAB — CBC
HCT: 22.3 % — ABNORMAL LOW (ref 36.0–46.0)
Hemoglobin: 7.7 g/dL — ABNORMAL LOW (ref 12.0–15.0)
MCH: 32.1 pg (ref 26.0–34.0)
MCHC: 34.5 g/dL (ref 30.0–36.0)
MCV: 92.9 fL (ref 80.0–100.0)
Platelets: 513 10*3/uL — ABNORMAL HIGH (ref 150–400)
RBC: 2.4 MIL/uL — ABNORMAL LOW (ref 3.87–5.11)
RDW: 20.9 % — ABNORMAL HIGH (ref 11.5–15.5)
WBC: 12.5 10*3/uL — ABNORMAL HIGH (ref 4.0–10.5)
nRBC: 2.1 % — ABNORMAL HIGH (ref 0.0–0.2)

## 2019-07-12 MED ORDER — ONDANSETRON HCL 4 MG/2ML IJ SOLN
4.0000 mg | Freq: Four times a day (QID) | INTRAMUSCULAR | Status: DC | PRN
  Administered 2019-07-12 (×3): 4 mg via INTRAVENOUS
  Filled 2019-07-12 (×3): qty 2

## 2019-07-12 MED ORDER — PROMETHAZINE HCL 25 MG/ML IJ SOLN
12.5000 mg | Freq: Once | INTRAMUSCULAR | Status: AC
  Administered 2019-07-12: 12.5 mg via INTRAVENOUS
  Filled 2019-07-12: qty 1

## 2019-07-12 NOTE — Progress Notes (Signed)
Subjective: A 23 year old female with history of sickle cell disease who was admitted with sickle cell painful crisis.  Patient is still having pain at 6 out of 10.  She is not on any PCA.  She has been taking her medications in the hospital.  She is mostly sleepy.  Patient is homeless and has discussed options with Education officer, museum.  She has longstanding history of polysubstance abuse and was admitted with community-acquired pneumonia and acute chest syndrome.  Patient is currently on room air.  Objective: Vital signs in last 24 hours: Temp:  [97.9 F (36.6 C)-98.4 F (36.9 C)] 98 F (36.7 C) (09/12 1007) Pulse Rate:  [60-74] 74 (09/12 1007) Resp:  [14-18] 14 (09/12 1007) BP: (97-100)/(60-66) 98/60 (09/12 1007) SpO2:  [94 %-97 %] 94 % (09/12 1007) Weight:  [63.8 kg] 63.8 kg (09/12 0521) Weight change: -1.2 kg Last BM Date: 07/06/19  Intake/Output from previous day: 09/11 0701 - 09/12 0700 In: 1740.2 [P.O.:1000; Blood:600; IV Piggyback:140.2] Out: -  Intake/Output this shift: Total I/O In: 209.7 [IV Piggyback:209.7] Out: -   General appearance: alert, cooperative, appears stated age and no distress Back: symmetric, no curvature. ROM normal. No CVA tenderness. Resp: clear to auscultation bilaterally Cardio: regular rate and rhythm, S1, S2 normal, no murmur, click, rub or gallop Female genitalia: normal Extremities: extremities normal, atraumatic, no cyanosis or edema Pulses: 2+ and symmetric Skin: Skin color, texture, turgor normal. No rashes or lesions Neurologic: Grossly normal  Lab Results: Recent Labs    07/11/19 0510 07/11/19 1321 07/12/19 0513  WBC 13.8*  --  12.5*  HGB 6.8* 7.7* 7.7*  HCT 19.2* 22.1* 22.3*  PLT 355  --  513*   BMET Recent Labs    07/10/19 0909 07/11/19 0510  NA 141 140  K 5.0 4.0  CL 109 107  CO2 20* 24  GLUCOSE 91 99  BUN 8 14  CREATININE 0.83 0.83  CALCIUM 9.1 8.6*    Studies/Results: No results found.  Medications: I have reviewed  the patient's current medications.  Assessment/Plan: A 23 year old female with sickle cell crisis and homelessness.  #1 sickle cell painful crisis: Appears to have largely resolved.  Oral medications.  No IV opiates at this point.  Continue.  #2 sickle cell anemia: Status post transfusion of PRBC.  Patient appears to be stable.  We will continue with close monitoring  #3 community-acquired pneumonia: Complete antibiotics.  #4 polysubstance abuse: Including cocaine and tobacco abuse.  Counseling provided.  #5 homelessness: Resources given by Education officer, museum.  Patient says she probably go back to live with her dad.  LOS: 2 days   GARBA,LAWAL 07/12/2019, 11:38 AM

## 2019-07-13 LAB — CULTURE, BLOOD (ROUTINE X 2)
Culture: NO GROWTH
Culture: NO GROWTH
Special Requests: ADEQUATE

## 2019-07-13 LAB — BPAM RBC
Blood Product Expiration Date: 202009302359
Blood Product Expiration Date: 202010032359
ISSUE DATE / TIME: 202009102046
ISSUE DATE / TIME: 202009110820
Unit Type and Rh: 7300
Unit Type and Rh: 9500

## 2019-07-13 LAB — TYPE AND SCREEN
ABO/RH(D): B POS
Antibody Screen: NEGATIVE
Unit division: 0
Unit division: 0

## 2019-07-13 MED ORDER — AZITHROMYCIN 500 MG PO TABS: 500.0000 mg | ORAL_TABLET | Freq: Every day | ORAL | 0 refills | Status: AC

## 2019-07-13 NOTE — Discharge Summary (Signed)
Physician Discharge Summary  Patient ID: [name] MRN: [id number] DOB/AGE: 1996-08-23 23 y.o.  Admit date: 07/10/2019 Discharge date: 07/13/2019  Admission Diagnoses:  Discharge Diagnoses:  Principal Problem:   Sickle cell pain crisis ([place]) Active Problems:   Leukocytosis   Anemia of chronic disease   Homelessness   Substance abuse ([place])   Generalized anxiety disorder   Cocaine abuse ([place])   Cocaine-induced mood disorder ([place])   Discharged Condition: good  Hospital Course: Patient is a 23 year old homeless woman with history of sickle cell disease and recurrent hospitalization due to sickle cell painful crisis.  She was admitted this time around with sickle cell crisis and suspected pneumonia or acute chest syndrome.  Patient was initiated on IV antibiotics.  She was also treated with her regular pain regimen.  She had some cough and some shortness of breath.  Patient treated while avoiding excessive IV pain medications.  She did much better.  She is back on room air.  At this point counseling given to the patient.  She was seen by Education officer, museum and assisted her in finding a place to go to after discharge.  Patient decided to go home on live with her father at discharge.  Consults: None  Significant Diagnostic Studies: labs: Serial CBCs and CMP was checked, no transfusion given  Treatments: IV hydration, antibiotics: Levaquin and analgesia: acetaminophen and Dilaudid  Discharge Exam: Blood pressure 111/66, pulse (!) 59, temperature 99.1 F (37.3 C), temperature source Oral, resp. rate 14, height 5\' 8"  (1.727 m), weight 60.5 kg, last menstrual period 07/10/2019, SpO2 91 %. General appearance: alert, cooperative and appears stated age Neck: no adenopathy, no carotid bruit, no JVD, supple, symmetrical, trachea midline and thyroid not enlarged, symmetric, no tenderness/mass/nodules Back: symmetric, no curvature. ROM normal. No CVA tenderness. Resp: clear to auscultation  bilaterally Cardio: regular rate and rhythm, S1, S2 normal, no murmur, click, rub or gallop GI: soft, non-tender; bowel sounds normal; no masses,  no organomegaly Extremities: extremities normal, atraumatic, no cyanosis or edema Pulses: 2+ and symmetric Skin: Skin color, texture, turgor normal. No rashes or lesions Neurologic: Grossly normal  Disposition: Discharge disposition: 01-Home or Self Care       Discharge Instructions    Diet - low sodium heart healthy   Complete by: As directed    Increase activity slowly   Complete by: As directed      Allergies as of 07/13/2019   No Known Allergies     Medication List    TAKE these medications   acetaminophen 500 MG tablet Commonly known as: TYLENOL Take 4,000 mg by mouth every 6 (six) hours as needed for mild pain or headache.   azithromycin 500 MG tablet Commonly known as: Zithromax Take 1 tablet (500 mg total) by mouth daily for 3 days. Take 1 tablet daily for 3 days.   folic acid 1 MG tablet Commonly known as: FOLVITE Take 1 tablet (1 mg total) by mouth daily.   gabapentin 100 MG capsule Commonly known as: NEURONTIN Take 1 capsule (100 mg total) by mouth 3 (three) times daily.   ibuprofen 800 MG tablet Commonly known as: ADVIL Take 1 tablet (800 mg total) by mouth every 8 (eight) hours as needed.   ondansetron 4 MG tablet Commonly known as: ZOFRAN Take 1 tablet (4 mg total) by mouth every 8 (eight) hours as needed for nausea.   oxyCODONE-acetaminophen 5-325 MG tablet Commonly known as: Percocet Take 1 tablet by mouth every 6 (six) hours as  needed for severe pain.        SignedBarbette Merino 07/13/2019, 11:01 AM   Time spent 34 minutes

## 2019-07-13 NOTE — Progress Notes (Signed)
Reviewed discharge paperwork and medication regimen with patient. Patient declined wheelchair escort and walked to vehicle herself.

## 2019-07-14 MED FILL — AZITHROMYCIN 500 MG TABS: 500 | 3 days supply | Qty: 7 | Fill #0

## 2019-07-17 NOTE — Discharge Summary (Signed)
Physician Discharge Summary  [name] [id number] DOB: [birth year] DOA: 07/08/2019  PCP: Dorena Dew, FNP  Admit date: 07/08/2019  Discharge date: 07/17/2019  Discharge Diagnoses:  Active Problems:   Sickle cell pain crisis ([facility])   Leukocytosis   Generalized anxiety disorder   Cocaine abuse ([facility])   Cocaine-induced mood disorder ([place])   Community acquired pneumonia   Discharge Condition: Left AGAINST MEDICAL ADVICE  Disposition:  Left AGAINST MEDICAL ADVICE  Diet: Regular Wt Readings from Last 3 Encounters:  07/13/19 60.5 kg  07/08/19 65 kg  06/09/19 63 kg    History of present illness:  [name] is a 23 year old female with a medical history significant for sickle cell disease, chronic pain syndrome, history of cocaine abuse, and history of generalized anxiety disorder that presented to the ER with shortness of breath over the past several days.  Patient also endorses "pain all over".  She states that pain and shortness of breath been worsening over the past several days.  She has not identified any palliative or provocative factors concerning current crisis.  Patient also does not have a PCP and does not have home pain medications.  Current pain intensity is 10/10 characterized patient endorses persistent cough.  She denies sick contacts, recent travel, or exposure to COVID-19.  Patient states that she had a fever at home, she felt hot but did not check the temperature. She endorses some chest pain primarily to left side, characterized as nonradiating.  Pain is mostly to low back and lower extremities.  She denies dizziness, paresthesias, abdominal pain, nausea, vomiting, or diarrhea.  ER course: Chest x-ray shows interval development of patchy airspace opacity in the medial aspect of the right middle lobe.  WBCs 33,000.  Lactic acid negative.  COVID-19 negative.  Blood cultures pending.  Hemoglobin 8.7, appears to be consistent with patient's baseline.   Platelets 636 and total bilirubin 6.5.  Blood pressure 107/61, HR 100, respirations 20, oxygen saturation 92% on RA.  Patient admitted for pain crisis in the presence of community-acquired pneumonia versus acute chest syndrome.  Hospital Course:  Patient was admitted for sickle cell pain crisis in the presence of CAP versus acute chest syndrome.  She was admitted, initiated empiric antibiotics, she received initial antibiotics in the ER.  Patient's hemoglobin is 8.7, there was no clinical indication for blood transfusion.  Patient was also admitted for sickle cell pain crisis and managed appropriately with IVF an IV Toradol.  Patient's urine drug screen was positive for cocaine.  No IV opiate medications were ordered.  Oxycodone 10 mg every 4 hours as needed for severe pain.    Patient left AGAINST MEDICAL ADVICE.  On last exam performed by this provider, patient was hemodynamically stable.  Discharge Exam: Vitals:   07/08/19 1650 07/08/19 2223  BP: 100/62 (!) 98/59  Pulse: 81 91  Resp: 20 16  Temp: 98.2 F (36.8 C) 98.4 F (36.9 C)  SpO2: 97% 90%   Vitals:   07/08/19 1330 07/08/19 1344 07/08/19 1650 07/08/19 2223  BP: 104/61  100/62 (!) 98/59  Pulse:  (!) 112 81 91  Resp:  18 20 16   Temp:   98.2 F (36.8 C) 98.4 F (36.9 C)  TempSrc:   Oral Oral  SpO2:  90% 97% 90%  Weight:      Height:        Discharge Instructions   Allergies as of 07/09/2019   No Known Allergies     Medication List  ASK your doctor about these medications   folic acid 1 MG tablet Commonly known as: FOLVITE Take 1 tablet (1 mg total) by mouth daily.   gabapentin 100 MG capsule Commonly known as: NEURONTIN Take 1 capsule (100 mg total) by mouth 3 (three) times daily.   ibuprofen 800 MG tablet Commonly known as: ADVIL Take 1 tablet (800 mg total) by mouth every 8 (eight) hours as needed.   ondansetron 4 MG tablet Commonly known as: ZOFRAN Take 1 tablet (4 mg total) by mouth every 8 (eight)  hours as needed for nausea.   oxyCODONE-acetaminophen 5-325 MG tablet Commonly known as: Percocet Take 1 tablet by mouth every 6 (six) hours as needed for severe pain.       The results of significant diagnostics from this hospitalization (including imaging, microbiology, ancillary and laboratory) are listed below for reference.    Significant Diagnostic Studies: Dg Chest 2 View  Result Date: 07/10/2019 CLINICAL DATA:  Chest pain. Patient is a smoker who has a history of sickle cell anemia and asthma. EXAM: CHEST - 2 VIEW COMPARISON:  Chest radiograph dated 07/08/2019 and CT chest dated 03/01/2019. FINDINGS: The heart size and mediastinal contours are within normal limits. There is mild bibasilar atelectasis/airspace disease. The visualized skeletal structures are unremarkable. IMPRESSION: Mild bibasilar atelectasis/airspace disease may reflect pneumonia and/or acute chest syndrome. Electronically Signed   By: Zerita Boers M.D.   On: 07/10/2019 10:59   Dg Chest Port 1 View  Result Date: 07/08/2019 CLINICAL DATA:  23 year old female with sickle cell disease and history of asthma presents with shortness of breath, cough and chest wall pain. EXAM: PORTABLE CHEST 1 VIEW COMPARISON:  Prior chest x-ray 06/08/2019 FINDINGS: Slightly increased indistinct airspace opacity in the medial right middle lobe partially obscuring the cardiac margin. Otherwise, stable bronchitic changes and mild hyperinflation. No pulmonary edema, pleural effusion or pneumothorax. No acute osseous abnormality. IMPRESSION: Interval development of patchy airspace opacity in the medial aspect of the right middle lobe. Differential considerations include pneumonia, atelectasis, and, in the setting of sickle cell disease, veno occlusive disease. Electronically Signed   By: Jacqulynn Cadet M.D.   On: 07/08/2019 10:20    Microbiology: Recent Results (from the past 240 hour(s))  Urine culture     Status: Abnormal   Collection Time:  07/08/19  9:47 AM   Specimen: Urine, Clean Catch  Result Value Ref Range Status   Specimen Description   Final    URINE, CLEAN CATCH Performed at [facility], [place] [address] [place], [place] [address]    Special Requests   Final    Immunocompromised Performed at [facility], [place] [address], [place], [place] [address]    Culture (A)  Final    >=100,000 COLONIES/mL GROUP B STREP(S.AGALACTIAE)ISOLATED TESTING AGAINST S. AGALACTIAE NOT ROUTINELY PERFORMED DUE TO PREDICTABILITY OF AMP/PEN/VAN SUSCEPTIBILITY. Performed at [facility], [place] [address] [place], [place] [address]    Report Status 07/09/2019 FINAL  Final  SARS Coronavirus 2 [facility] order, Performed in [facility] hospital lab) Nasopharyngeal Nasopharyngeal Swab     Status: None   Collection Time: 07/08/19 10:46 AM   Specimen: Nasopharyngeal Swab  Result Value Ref Range Status   SARS Coronavirus 2 NEGATIVE NEGATIVE Final    Comment: (NOTE) If result is NEGATIVE SARS-CoV-2 target nucleic acids are NOT DETECTED. The SARS-CoV-2 RNA is generally detectable in upper and lower  respiratory specimens during the acute phase of infection. The lowest  concentration  of SARS-CoV-2 viral copies this assay can detect is 250  copies / mL. A negative result does not preclude SARS-CoV-2 infection  and should not be used as the sole basis for treatment or other  patient management decisions.  A negative result may occur with  improper specimen collection / handling, submission of specimen other  than nasopharyngeal swab, presence of viral mutation(s) within the  areas targeted by this assay, and inadequate number of viral copies  (<250 copies / mL). A negative result must be combined with clinical  observations, patient history, and epidemiological information. If result is POSITIVE SARS-CoV-2 target nucleic acids are DETECTED. The SARS-CoV-2 RNA is generally detectable in upper and lower   respiratory specimens dur ing the acute phase of infection.  Positive  results are indicative of active infection with SARS-CoV-2.  Clinical  correlation with patient history and other diagnostic information is  necessary to determine patient infection status.  Positive results do  not rule out bacterial infection or co-infection with other viruses. If result is PRESUMPTIVE POSTIVE SARS-CoV-2 nucleic acids MAY BE PRESENT.   A presumptive positive result was obtained on the submitted specimen  and confirmed on repeat testing.  While 2019 novel coronavirus  (SARS-CoV-2) nucleic acids may be present in the submitted sample  additional confirmatory testing may be necessary for epidemiological  and / or clinical management purposes  to differentiate between  SARS-CoV-2 and other Sarbecovirus currently known to infect humans.  If clinically indicated additional testing with an alternate test  methodology [phone]) is advised. The SARS-CoV-2 RNA is generally  detectable in upper and lower respiratory sp ecimens during the acute  phase of infection. The expected result is Negative. Fact Sheet for Patients:  StrictlyIdeas.no Fact Sheet for Healthcare Providers: BankingDealers.co.za This test is not yet approved or cleared by the [place] FDA and has been authorized for detection and/or diagnosis of SARS-CoV-2 by FDA under an Emergency Use Authorization (EUA).  This EUA will remain in effect (meaning this test can be used) for the duration of the COVID-19 declaration under Section 564(b)(1) of the Act, 21 U.S.C. section 360bbb-3(b)(1), unless the authorization is terminated or revoked sooner. Performed at [facility], [place] [address] [place], [place] [address]   Blood Culture (routine x 2)     Status: None   Collection Time: 07/08/19 12:43 PM   Specimen: BLOOD RIGHT FOREARM  Result Value Ref Range Status   Specimen  Description   Final    BLOOD RIGHT FOREARM Performed at [facility], [place] [address], [place], [place] [address]    Special Requests   Final    BOTTLES DRAWN AEROBIC AND ANAEROBIC Blood Culture results may not be optimal due to an excessive volume of blood received in culture bottles Performed at [facility] [address] [place], [place] [address]    Culture   Final    NO GROWTH 5 DAYS Performed at [facility], [place] [address], [place], [place] [address]    Report Status 07/13/2019 FINAL  Final  Blood Culture (routine x 2)     Status: None   Collection Time: 07/08/19  7:53 PM   Specimen: BLOOD  Result Value Ref Range Status   Specimen Description   Final    BLOOD RIGHT ANTECUBITAL Performed at [place] [address] [place], [place] [address]    Special Requests   Final    BOTTLES DRAWN AEROBIC ONLY Blood Culture  adequate volume Performed at [place] [address], [place], [place] [address]    Culture   Final    NO GROWTH 5 DAYS Performed at [facility], [place] [address], [place], [place] [address]    Report Status 07/13/2019 FINAL  Final  SARS CORONAVIRUS 2 (TAT 6-24 HRS) Nasopharyngeal Nasopharyngeal Swab     Status: None   Collection Time: 07/10/19  1:01 PM   Specimen: Nasopharyngeal Swab  Result Value Ref Range Status   SARS Coronavirus 2 NEGATIVE NEGATIVE Final    Comment: (NOTE) SARS-CoV-2 target nucleic acids are NOT DETECTED. The SARS-CoV-2 RNA is generally detectable in upper and lower respiratory specimens during the acute phase of infection. Negative results do not preclude SARS-CoV-2 infection, do not rule out co-infections with other pathogens, and should not be used as the sole basis for treatment or other patient management decisions. Negative results must be combined with clinical observations, patient history, and epidemiological information. The expected result is  Negative. Fact Sheet for Patients: SugarRoll.be Fact Sheet for Healthcare Providers: https://www.woods-mathews.com/ This test is not yet approved or cleared by the [place] FDA and  has been authorized for detection and/or diagnosis of SARS-CoV-2 by FDA under an Emergency Use Authorization (EUA). This EUA will remain  in effect (meaning this test can be used) for the duration of the COVID-19 declaration under Section 56 4(b)(1) of the Act, 21 U.S.C. section 360bbb-3(b)(1), unless the authorization is terminated or revoked sooner. Performed at [facility], [place] [address] [place], [place] [address]      Labs: Basic Metabolic Panel: Recent Labs  Lab 07/11/19 0510  NA 140  K 4.0  CL 107  CO2 24  GLUCOSE 99  BUN 14  CREATININE 0.83  CALCIUM 8.6*   Liver Function Tests: Recent Labs  Lab 07/11/19 0510  AST 25  ALT 26  ALKPHOS 54  BILITOT 2.4*  PROT 6.4*  ALBUMIN 3.0*   No results for input(s): LIPASE, AMYLASE in the last 168 hours. No results for input(s): AMMONIA in the last 168 hours. CBC: Recent Labs  Lab 07/11/19 0510 07/11/19 1321 07/12/19 0513  WBC 13.8*  --  12.5*  NEUTROABS 7.4  --   --   HGB 6.8* 7.7* 7.7*  HCT 19.2* 22.1* 22.3*  MCV 92.3  --  92.9  PLT 355  --  513*   Cardiac Enzymes: No results for input(s): CKTOTAL, CKMB, CKMBINDEX, TROPONINI in the last 168 hours. BNP: Invalid input(s): POCBNP CBG: No results for input(s): GLUCAP in the last 168 hours.    Signed:  Donia Pounds  APRN, MSN, FNP-C [facility] [address] [place], [place] [address] [phone]  [facility] 07/17/2019, 9:09 AM

## 2019-09-09 ENCOUNTER — Other Ambulatory Visit: Payer: Self-pay

## 2019-09-09 ENCOUNTER — Emergency Department (HOSPITAL_COMMUNITY)
Admission: EM | Admit: 2019-09-09 | Discharge: 2019-09-09 | Disposition: A | Discharge status: Home / Self Care | Payer: Medicaid Other | Attending: Emergency Medicine | Admitting: Emergency Medicine

## 2019-09-09 ENCOUNTER — Encounter (HOSPITAL_COMMUNITY): Payer: Self-pay | Admitting: Emergency Medicine

## 2019-09-09 DIAGNOSIS — D5700 Hb-SS disease with crisis, unspecified: Secondary | ICD-10-CM | POA: Insufficient documentation

## 2019-09-09 DIAGNOSIS — F1410 Cocaine abuse, uncomplicated: Secondary | ICD-10-CM | POA: Insufficient documentation

## 2019-09-09 DIAGNOSIS — F1210 Cannabis abuse, uncomplicated: Secondary | ICD-10-CM | POA: Diagnosis not present

## 2019-09-09 DIAGNOSIS — F17210 Nicotine dependence, cigarettes, uncomplicated: Secondary | ICD-10-CM | POA: Diagnosis not present

## 2019-09-09 LAB — COMPREHENSIVE METABOLIC PANEL
ALT: 18 U/L (ref 0–44)
AST: 27 U/L (ref 15–41)
Albumin: 3.9 g/dL (ref 3.5–5.0)
Alkaline Phosphatase: 71 U/L (ref 38–126)
Anion gap: 9 (ref 5–15)
BUN: 14 mg/dL (ref 6–20)
CO2: 19 mmol/L — ABNORMAL LOW (ref 22–32)
Calcium: 8.9 mg/dL (ref 8.9–10.3)
Chloride: 109 mmol/L (ref 98–111)
Creatinine, Ser: 1.1 mg/dL — ABNORMAL HIGH (ref 0.44–1.00)
GFR calc Af Amer: >60 mL/min (ref >60)
GFR calc non Af Amer: >60 mL/min (ref >60)
Glucose, Bld: 91 mg/dL (ref 70–99)
Potassium: 4.3 mmol/L (ref 3.5–5.1)
Sodium: 137 mmol/L (ref 135–145)
Total Bilirubin: 5.4 mg/dL — ABNORMAL HIGH (ref 0.3–1.2)
Total Protein: 6.5 g/dL (ref 6.5–8.1)

## 2019-09-09 LAB — CBC WITH DIFFERENTIAL/PLATELET
Abs Immature Granulocytes: 0.09 10*3/uL — ABNORMAL HIGH (ref 0.00–0.07)
Basophils Absolute: 0.1 10*3/uL (ref 0.0–0.1)
Basophils Relative: 0 %
Eosinophils Absolute: 0.1 10*3/uL (ref 0.0–0.5)
Eosinophils Relative: 1 %
HCT: 28.1 % — ABNORMAL LOW (ref 36.0–46.0)
Hemoglobin: 9.9 g/dL — ABNORMAL LOW (ref 12.0–15.0)
Immature Granulocytes: 1 %
Lymphocytes Relative: 30 %
Lymphs Abs: 5.1 10*3/uL — ABNORMAL HIGH (ref 0.7–4.0)
MCH: 34 pg (ref 26.0–34.0)
MCHC: 35.2 g/dL (ref 30.0–36.0)
MCV: 96.6 fL (ref 80.0–100.0)
Monocytes Absolute: 1.1 10*3/uL — ABNORMAL HIGH (ref 0.1–1.0)
Monocytes Relative: 7 %
Neutro Abs: 10.3 10*3/uL — ABNORMAL HIGH (ref 1.7–7.7)
Neutrophils Relative %: 61 %
Platelets: 538 10*3/uL — ABNORMAL HIGH (ref 150–400)
RBC: 2.91 MIL/uL — ABNORMAL LOW (ref 3.87–5.11)
RDW: 19.5 % — ABNORMAL HIGH (ref 11.5–15.5)
WBC: 16.8 10*3/uL — ABNORMAL HIGH (ref 4.0–10.5)
nRBC: 1.1 % — ABNORMAL HIGH (ref 0.0–0.2)

## 2019-09-09 LAB — RETICULOCYTES
Immature Retic Fract: 27.2 % — ABNORMAL HIGH (ref 2.3–15.9)
RBC.: 2.91 MIL/uL — ABNORMAL LOW (ref 3.87–5.11)
Retic Count, Absolute: 334.9 10*3/uL — ABNORMAL HIGH (ref 19.0–186.0)
Retic Ct Pct: 11.5 % — ABNORMAL HIGH (ref 0.4–3.1)

## 2019-09-09 LAB — I-STAT BETA HCG BLOOD, ED (MC, WL, AP ONLY): I-stat hCG, quantitative: <5 m[IU]/mL (ref <5)

## 2019-09-09 MED ORDER — HYDROCODONE-ACETAMINOPHEN 5-325 MG PO TABS
1.0000 | ORAL_TABLET | Freq: Once | ORAL | Status: AC
  Administered 2019-09-09: 1 via ORAL
  Filled 2019-09-09: qty 1

## 2019-09-09 MED ORDER — HYDROMORPHONE HCL 1 MG/ML IJ SOLN
1.0000 mg | Freq: Once | INTRAMUSCULAR | Status: AC
  Administered 2019-09-09: 1 mg via INTRAVENOUS
  Filled 2019-09-09: qty 1

## 2019-09-09 MED ORDER — HYDROCODONE-ACETAMINOPHEN 5-325 MG PO TABS: 1.0000 | ORAL_TABLET | Freq: Four times a day (QID) | ORAL | 0 refills | Status: DC | PRN

## 2019-09-09 MED ORDER — SODIUM CHLORIDE 0.9 % IV BOLUS (SEPSIS)
1000.0000 mL | Freq: Once | INTRAVENOUS | Status: AC
  Administered 2019-09-09: 1000 mL via INTRAVENOUS

## 2019-09-09 MED ORDER — KETOROLAC TROMETHAMINE 30 MG/ML IJ SOLN
30.0000 mg | Freq: Once | INTRAMUSCULAR | Status: AC
  Administered 2019-09-09: 30 mg via INTRAVENOUS
  Filled 2019-09-09: qty 1

## 2019-09-09 MED ORDER — SODIUM CHLORIDE 0.9% FLUSH
3.0000 mL | Freq: Once | INTRAVENOUS | Status: AC
  Administered 2019-09-09: 3 mL via INTRAVENOUS

## 2019-09-09 MED ORDER — ONDANSETRON HCL 4 MG/2ML IJ SOLN
4.0000 mg | Freq: Once | INTRAMUSCULAR | Status: AC
  Administered 2019-09-09: 4 mg via INTRAVENOUS
  Filled 2019-09-09: qty 2

## 2019-09-09 MED FILL — HYDROCODON-APAP 5-325: 5-325 | 5 days supply | Qty: 20 | Fill #0

## 2019-09-09 NOTE — ED Provider Notes (Signed)
[facility] EMERGENCY DEPARTMENT Provider Note   CSN: [id number] Arrival date & time: 09/09/19  0308     History   Chief Complaint Chief Complaint  Patient presents with  . Sickle Cell Pain Crisis    HPI [name] is a 23 y.o. female.      Patient complains of sickle cell pain in her legs.  Patient has a history of this pain before  The history is provided by the patient. No language interpreter was used.  Sickle Cell Pain Crisis Location:  Head Severity:  Moderate Onset quality:  Sudden Similar to previous crisis episodes: no   Timing:  Constant Progression:  Worsening Chronicity:  Recurrent Sickle cell genotype:  SS History of pulmonary emboli: no   Context: not alcohol consumption   Associated symptoms: no chest pain, no congestion, no cough, no fatigue and no headaches     Past Medical History:  Diagnosis Date  . Acute kidney injury ([place]) 05/15/2016  . Amenorrhea   . Anemia    SICKLE CELL  . Asthma   . Drug-induced pruritus 02/26/2015  . GERD (gastroesophageal reflux disease)   . Headache   . Hyperbilirubinemia 02/26/2015  . Sickle cell disease [facility])     Patient Active Problem List   Diagnosis Date Noted  . Community acquired pneumonia 07/08/2019  . Cocaine-induced mood disorder ([place])   . Generalized anxiety disorder   . Cocaine abuse ([place])   . Elevated liver enzymes 05/17/2018  . Sickle cell anemia ([place]) 05/16/2018  . Sickle cell crisis ([place]) 05/16/2018  . Sickle cell anemia with crisis ([place]) 12/16/2017  . Substance abuse ([place]) 12/16/2017  . Homelessness 02/12/2017  . Protein-calorie malnutrition, severe 10/21/2016  . Anemia of chronic disease   . Hb-SS disease without crisis ([place])   . Hereditary hemolytic anemia ([place])   . 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 Tobacco abuse   . Leukocytosis   . Sickle cell pain crisis ([place]) 02/15/2013  . Abdominal pain 07/25/2012  . Asthma, mild  intermittent 12/14/2010    Past Surgical History:  Procedure Laterality Date  . 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 TONSILLECTOMY     Age 45     OB History   No obstetric history on file.      Home Medications    Prior to Admission medications   Medication Sig Start Date End Date Taking? Authorizing Provider  acetaminophen (TYLENOL) 500 MG tablet Take 4,000 mg by mouth every 6 (six) hours as needed for mild pain or headache.    [provider]  folic acid (FOLVITE) 1 MG tablet Take 1 tablet (1 mg total) by mouth daily. Patient not taking: Reported on 07/10/2019 03/29/19   Dorena Dew, FNP  gabapentin (NEURONTIN) 100 MG capsule Take 1 capsule (100 mg total) by mouth 3 (three) times daily. Patient not taking: Reported on 07/10/2019 03/28/19   Dorena Dew, FNP  HYDROcodone-acetaminophen (NORCO/VICODIN) 5-325 MG tablet Take 1 tablet by mouth every 6 (six) hours as needed for moderate pain. 09/09/19   Milton Ferguson, MD  ibuprofen (ADVIL) 800 MG tablet Take 1 tablet (800 mg total) by mouth every 8 (eight) hours as needed. Patient not taking: Reported on 07/08/2019 03/28/19   Dorena Dew, FNP  ondansetron (ZOFRAN) 4 MG tablet Take 1 tablet (4 mg total) by mouth every 8 (eight) hours as needed for nausea. Patient not taking: Reported on 07/08/2019 03/28/19  Dorena Dew, FNP  oxyCODONE-acetaminophen (PERCOCET) 5-325 MG tablet Take 1 tablet by mouth every 6 (six) hours as needed for severe pain. Patient not taking: Reported on 07/08/2019 03/28/19 03/27/20  Dorena Dew, FNP    Family History Family History  Problem Relation Age of Onset  . Hypertension Paternal Grandfather   . Sickle cell trait Father   . Cancer Mother        Died in 02-11-2009    Social History Social History   Tobacco Use  . Smoking status: Current Every Day Smoker    Packs/day: 0.50    Years: 10.00    Pack years: 5.00    Types: Cigarettes  . Smokeless tobacco: Never Used   Substance Use Topics  . Alcohol use: Not Currently    Alcohol/week: 6.0 standard drinks    Types: 6 Shots of liquor per week  . Drug use: Yes    Types: Marijuana, Cocaine     Allergies   Patient has no known allergies.   Review of Systems Review of Systems  Constitutional: Negative for appetite change and fatigue.  HENT: Negative for congestion, ear discharge and sinus pressure.   Eyes: Negative for discharge.  Respiratory: Negative for cough.   Cardiovascular: Negative for chest pain.  Gastrointestinal: Negative for abdominal pain and diarrhea.  Genitourinary: Negative for frequency and hematuria.  Musculoskeletal: Negative for back pain.       Pain in both legs and arms  Skin: Negative for rash.  Neurological: Negative for seizures and headaches.  Psychiatric/Behavioral: Negative for hallucinations.     Physical Exam Updated Vital Signs BP 106/88   Pulse 71   Temp 98.7 F (37.1 C)   Resp 18   SpO2 96%   Physical Exam Vitals signs and nursing note reviewed.  Constitutional:      Appearance: She is well-developed.  HENT:     Head: Normocephalic.     Nose: Nose normal.  Eyes:     General: No scleral icterus.    Conjunctiva/sclera: Conjunctivae normal.  Neck:     Musculoskeletal: Neck supple.     Thyroid: No thyromegaly.  Cardiovascular:     Rate and Rhythm: Normal rate and regular rhythm.     Heart sounds: No murmur. No friction rub. No gallop.   Pulmonary:     Breath sounds: No stridor. No wheezing or rales.  Chest:     Chest wall: No tenderness.  Abdominal:     General: There is no distension.     Tenderness: There is no abdominal tenderness. There is no rebound.  Musculoskeletal: Normal range of motion.  Lymphadenopathy:     Cervical: No cervical adenopathy.  Skin:    Findings: No erythema or rash.  Neurological:     Mental Status: She is oriented to person, place, and time.     Motor: No abnormal muscle tone.     Coordination: Coordination  normal.  Psychiatric:        Behavior: Behavior normal.      ED Treatments / Results  Labs (all labs ordered are listed, but only abnormal results are displayed) Labs Reviewed  COMPREHENSIVE METABOLIC PANEL - Abnormal; Notable for the following components:      Result Value   CO2 19 (*)    Creatinine, Ser 1.10 (*)    Total Bilirubin 5.4 (*)    All other components within normal limits  CBC WITH DIFFERENTIAL/PLATELET - Abnormal; Notable for the following components:   WBC 16.8 (*)  RBC 2.91 (*)    Hemoglobin 9.9 (*)    HCT 28.1 (*)    RDW 19.5 (*)    Platelets 538 (*)    nRBC 1.1 (*)    Neutro Abs 10.3 (*)    Lymphs Abs 5.1 (*)    Monocytes Absolute 1.1 (*)    Abs Immature Granulocytes 0.09 (*)    All other components within normal limits  RETICULOCYTES - Abnormal; Notable for the following components:   Retic Ct Pct 11.5 (*)    RBC. 2.91 (*)    Retic Count, Absolute 334.9 (*)    Immature Retic Fract 27.2 (*)    All other components within normal limits  I-STAT BETA HCG BLOOD, ED ([facility], [facility], [facility] ONLY)    EKG None  Radiology No results found.  Procedures Procedures (including critical care time)  Medications Ordered in ED Medications  HYDROcodone-acetaminophen (NORCO/VICODIN) 5-325 MG per tablet 1 tablet (has no administration in time range)  sodium chloride flush (NS) 0.9 % injection 3 mL (3 mLs Intravenous Given 09/09/19 1103)  sodium chloride 0.9 % bolus 1,000 mL (0 mLs Intravenous Stopped 09/09/19 1129)  HYDROmorphone (DILAUDID) injection 1 mg (1 mg Intravenous Given 09/09/19 1101)  ondansetron (ZOFRAN) injection 4 mg (4 mg Intravenous Given 09/09/19 1058)  ketorolac (TORADOL) 30 MG/ML injection 30 mg (30 mg Intravenous Given 09/09/19 1102)     Initial Impression / Assessment and Plan / ED Course  I have reviewed the triage vital signs and the nursing notes.  Pertinent labs & imaging results that were available during my care of the patient were reviewed  by me and considered in my medical decision making (see chart for details).       Labs unremarkable.  Patient improved with pain medicine.  Patient with sickle cell crisis and she will be discharged home with some hydrocodone  Final Clinical Impressions(s) / ED Diagnoses   Final diagnoses:  Sickle cell pain crisis [facility])    ED Discharge Orders         Ordered    HYDROcodone-acetaminophen (NORCO/VICODIN) 5-325 MG tablet  Every 6 hours PRN     09/09/19 1359           Milton Ferguson, MD 09/09/19 1407

## 2019-09-09 NOTE — Discharge Instructions (Addendum)
Follow-up with your doctor if not improved

## 2019-09-09 NOTE — ED Triage Notes (Signed)
Patient here with sickle cell pain in her knees and arms.  She denies any chest pain at this time.  Patient states she has taken some ibuprofen with no relief of her pain.

## 2019-11-09 ENCOUNTER — Other Ambulatory Visit: Payer: Self-pay

## 2019-11-09 ENCOUNTER — Encounter (HOSPITAL_COMMUNITY): Payer: Self-pay

## 2019-11-09 ENCOUNTER — Emergency Department (HOSPITAL_COMMUNITY)
Admission: EM | Admit: 2019-11-09 | Discharge: 2019-11-09 | Disposition: A | Discharge status: Home / Self Care | Payer: Medicaid Other | Attending: Emergency Medicine | Admitting: Emergency Medicine

## 2019-11-09 DIAGNOSIS — M79601 Pain in right arm: Secondary | ICD-10-CM | POA: Insufficient documentation

## 2019-11-09 DIAGNOSIS — Z5321 Procedure and treatment not carried out due to patient leaving prior to being seen by health care provider: Secondary | ICD-10-CM | POA: Diagnosis not present

## 2019-11-09 DIAGNOSIS — M79602 Pain in left arm: Secondary | ICD-10-CM | POA: Diagnosis not present

## 2019-11-09 MED ORDER — SODIUM CHLORIDE 0.9% FLUSH: 3.0000 mL | Freq: Once | INTRAVENOUS | Status: DC

## 2019-11-09 NOTE — ED Triage Notes (Signed)
Patient c/o sickle cell pain bilateral arms and legs since last night.

## 2019-11-09 NOTE — ED Notes (Signed)
Final call for patient with no response.

## 2019-11-09 NOTE — ED Notes (Signed)
No call from patient when called for room x 2.

## 2019-11-10 ENCOUNTER — Emergency Department (HOSPITAL_COMMUNITY)
Admission: EM | Admit: 2019-11-10 | Discharge: 2019-11-10 | Disposition: A | Discharge status: Home / Self Care | Payer: Medicaid Other | Attending: Emergency Medicine | Admitting: Emergency Medicine

## 2019-11-10 ENCOUNTER — Emergency Department (HOSPITAL_COMMUNITY)
Admission: EM | Admit: 2019-11-10 | Discharge: 2019-11-10 | Discharge status: Absent without leave | Payer: Medicaid Other

## 2019-11-10 DIAGNOSIS — M7918 Myalgia, other site: Secondary | ICD-10-CM | POA: Diagnosis present

## 2019-11-10 DIAGNOSIS — Z5321 Procedure and treatment not carried out due to patient leaving prior to being seen by health care provider: Secondary | ICD-10-CM | POA: Insufficient documentation

## 2019-11-10 NOTE — ED Triage Notes (Addendum)
Patient c/o generalized sickle cell pain  That started 2 weeks ago. Patient checked in earlier today, but left due to the wait.    During triage patient ripped her BP cuff off and states she was leaving because she was not going to wait. Explained to the patient that we did not have any open rooms at this moment. Patient got up and walked out of triage.

## 2019-11-11 ENCOUNTER — Telehealth (HOSPITAL_COMMUNITY): Payer: Self-pay | Admitting: General Practice

## 2019-11-11 NOTE — Telephone Encounter (Signed)
Patient called, told RN her name and the date of birth thereafter "Zenia Resides" took over the call stating patient is "in and out'.  Complained of generalized pain rated at 9/10. Denied chest pain, diarrhea, abdominal pain, nausea/vomitting. Endorsed fever because patient "feels warm" to touch. Patient has "runny nose" too. They do not have a home thermometer. Tested negative for Covid-19, 1 month ago.Has recently been to the ER 3 times. The last time was yesterday. Had to leave in all occasions without been seen because the waiting time was "too long". Endorsed not having prescribed home pain medications. Admitted to having means of transportation without driving self after treatment. Last took 600 mg of Ibuprofen at about 16:30 yesterday. Per provider, patient needs to go back to the ER to be declared stable before possibly coming to the [facility]. Patient notified, verbalized understanding.

## 2019-12-22 ENCOUNTER — Emergency Department (HOSPITAL_COMMUNITY)
Admission: EM | Admit: 2019-12-22 | Discharge: 2019-12-22 | Disposition: A | Discharge status: Home / Self Care | Payer: Medicaid Other | Attending: Emergency Medicine | Admitting: Emergency Medicine

## 2019-12-22 ENCOUNTER — Encounter (HOSPITAL_COMMUNITY): Payer: Self-pay | Admitting: Emergency Medicine

## 2019-12-22 ENCOUNTER — Other Ambulatory Visit: Payer: Self-pay

## 2019-12-22 DIAGNOSIS — F17210 Nicotine dependence, cigarettes, uncomplicated: Secondary | ICD-10-CM | POA: Insufficient documentation

## 2019-12-22 DIAGNOSIS — M79605 Pain in left leg: Secondary | ICD-10-CM | POA: Insufficient documentation

## 2019-12-22 DIAGNOSIS — M545 Low back pain: Secondary | ICD-10-CM | POA: Insufficient documentation

## 2019-12-22 DIAGNOSIS — R079 Chest pain, unspecified: Secondary | ICD-10-CM | POA: Diagnosis present

## 2019-12-22 DIAGNOSIS — D5700 Hb-SS disease with crisis, unspecified: Secondary | ICD-10-CM | POA: Diagnosis not present

## 2019-12-22 DIAGNOSIS — R109 Unspecified abdominal pain: Secondary | ICD-10-CM | POA: Insufficient documentation

## 2019-12-22 DIAGNOSIS — F1210 Cannabis abuse, uncomplicated: Secondary | ICD-10-CM | POA: Diagnosis not present

## 2019-12-22 DIAGNOSIS — F1410 Cocaine abuse, uncomplicated: Secondary | ICD-10-CM | POA: Insufficient documentation

## 2019-12-22 DIAGNOSIS — M79604 Pain in right leg: Secondary | ICD-10-CM | POA: Insufficient documentation

## 2019-12-22 LAB — CBC WITH DIFFERENTIAL/PLATELET
Abs Immature Granulocytes: 0.07 10*3/uL (ref 0.00–0.07)
Basophils Absolute: 0.1 10*3/uL (ref 0.0–0.1)
Basophils Relative: 1 %
Eosinophils Absolute: 0 10*3/uL (ref 0.0–0.5)
Eosinophils Relative: 0 %
HCT: 25.4 % — ABNORMAL LOW (ref 36.0–46.0)
Hemoglobin: 8.7 g/dL — ABNORMAL LOW (ref 12.0–15.0)
Immature Granulocytes: 1 %
Lymphocytes Relative: 28 %
Lymphs Abs: 3.5 10*3/uL (ref 0.7–4.0)
MCH: 33.6 pg (ref 26.0–34.0)
MCHC: 34.3 g/dL (ref 30.0–36.0)
MCV: 98.1 fL (ref 80.0–100.0)
Monocytes Absolute: 0.9 10*3/uL (ref 0.1–1.0)
Monocytes Relative: 7 %
Neutro Abs: 8.1 10*3/uL — ABNORMAL HIGH (ref 1.7–7.7)
Neutrophils Relative %: 63 %
Platelets: 673 10*3/uL — ABNORMAL HIGH (ref 150–400)
RBC: 2.59 MIL/uL — ABNORMAL LOW (ref 3.87–5.11)
RDW: 20.5 % — ABNORMAL HIGH (ref 11.5–15.5)
WBC: 12.6 10*3/uL — ABNORMAL HIGH (ref 4.0–10.5)
nRBC: 3.6 % — ABNORMAL HIGH (ref 0.0–0.2)

## 2019-12-22 LAB — COMPREHENSIVE METABOLIC PANEL
ALT: 63 U/L — ABNORMAL HIGH (ref 0–44)
AST: 31 U/L (ref 15–41)
Albumin: 4.2 g/dL (ref 3.5–5.0)
Alkaline Phosphatase: 73 U/L (ref 38–126)
Anion gap: 13 (ref 5–15)
BUN: 12 mg/dL (ref 6–20)
CO2: 25 mmol/L (ref 22–32)
Calcium: 9.4 mg/dL (ref 8.9–10.3)
Chloride: 100 mmol/L (ref 98–111)
Creatinine, Ser: 0.78 mg/dL (ref 0.44–1.00)
GFR calc Af Amer: >60 mL/min (ref >60)
GFR calc non Af Amer: >60 mL/min (ref >60)
Glucose, Bld: 107 mg/dL — ABNORMAL HIGH (ref 70–99)
Potassium: 3.6 mmol/L (ref 3.5–5.1)
Sodium: 138 mmol/L (ref 135–145)
Total Bilirubin: 2.8 mg/dL — ABNORMAL HIGH (ref 0.3–1.2)
Total Protein: 7.5 g/dL (ref 6.5–8.1)

## 2019-12-22 LAB — RETICULOCYTES
Immature Retic Fract: 40.7 % — ABNORMAL HIGH (ref 2.3–15.9)
RBC.: 2.6 MIL/uL — ABNORMAL LOW (ref 3.87–5.11)
Retic Count, Absolute: 367.6 10*3/uL — ABNORMAL HIGH (ref 19.0–186.0)
Retic Ct Pct: 14.1 % — ABNORMAL HIGH (ref 0.4–3.1)

## 2019-12-22 LAB — PATHOLOGIST SMEAR REVIEW

## 2019-12-22 LAB — I-STAT BETA HCG BLOOD, ED (MC, WL, AP ONLY): I-stat hCG, quantitative: <5 m[IU]/mL (ref <5)

## 2019-12-22 MED ORDER — DIPHENHYDRAMINE HCL 50 MG/ML IJ SOLN
25.0000 mg | Freq: Once | INTRAMUSCULAR | Status: AC
  Administered 2019-12-22: 25 mg via INTRAVENOUS
  Filled 2019-12-22: qty 1

## 2019-12-22 MED ORDER — HYDROMORPHONE HCL 1 MG/ML IJ SOLN
0.5000 mg | INTRAMUSCULAR | Status: AC
  Administered 2019-12-22: 0.5 mg via INTRAVENOUS
  Filled 2019-12-22: qty 1

## 2019-12-22 MED ORDER — OXYCODONE HCL 5 MG PO TABS
10.0000 mg | ORAL_TABLET | Freq: Once | ORAL | Status: AC
  Administered 2019-12-22: 10 mg via ORAL
  Filled 2019-12-22: qty 2

## 2019-12-22 MED ORDER — ONDANSETRON HCL 4 MG/2ML IJ SOLN
4.0000 mg | INTRAMUSCULAR | Status: DC | PRN
  Administered 2019-12-22: 4 mg via INTRAVENOUS
  Filled 2019-12-22: qty 2

## 2019-12-22 MED ORDER — HYDROMORPHONE HCL 1 MG/ML IJ SOLN
1.0000 mg | INTRAMUSCULAR | Status: AC
  Administered 2019-12-22: 1 mg via INTRAVENOUS
  Filled 2019-12-22: qty 1

## 2019-12-22 MED ORDER — KETOROLAC TROMETHAMINE 15 MG/ML IJ SOLN
15.0000 mg | INTRAMUSCULAR | Status: AC
  Administered 2019-12-22: 15 mg via INTRAVENOUS
  Filled 2019-12-22: qty 1

## 2019-12-22 NOTE — ED Triage Notes (Signed)
Pt c/o sickle cell pain to back, chest and legs. Pt states she has no home meds.

## 2019-12-22 NOTE — ED Provider Notes (Signed)
[facility] EMERGENCY DEPARTMENT Provider Note   CSN: [id number] Arrival date & time: 12/22/19  0149     History Chief Complaint  Patient presents with  . Sickle Cell Pain Crisis    [name] is a 24 y.o. female.  The history is provided by the patient and medical records. No language interpreter was used.  Sickle Cell Pain Crisis    24 year old female with history of sickle cell disease, cocaine abuse, presenting complaining of sickle related pain.  Patient developed pain to her chest abdomen low back and bilateral leg since earlier today.  Pain is described as a tightness sharp pain felt similar to prior sickle cell crisis.  Pain is moderate to severe not improved with home pain medication.  No associated fever chills productive cough exertional chest pain shortness of breath dysuria nausea vomiting diarrhea or rash.  She did report that she ran out of her oxycodone recently.  She also felt weather changes may have affect her sickle cell pain.  She denies any recent alcohol or drug use.  Past Medical History:  Diagnosis Date  . Acute kidney injury ([place]) 05/15/2016  . Amenorrhea   . Anemia    SICKLE CELL  . Asthma   . Drug-induced pruritus 02/26/2015  . GERD (gastroesophageal reflux disease)   . Headache   . Hyperbilirubinemia 02/26/2015  . Sickle cell disease [facility])     Patient Active Problem List   Diagnosis Date Noted  . Community acquired pneumonia 07/08/2019  . Cocaine-induced mood disorder ([place])   . Generalized anxiety disorder   . Cocaine abuse ([place])   . Elevated liver enzymes 05/17/2018  . Sickle cell anemia ([place]) 05/16/2018  . Sickle cell crisis ([place]) 05/16/2018  . Sickle cell anemia with crisis ([place]) 12/16/2017  . Substance abuse ([place]) 12/16/2017  . Homelessness 02/12/2017  . Protein-calorie malnutrition, severe 10/21/2016  . Anemia of chronic disease   . Hb-SS disease without crisis ([place])   . Hereditary hemolytic anemia ([place])    . 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 Tobacco abuse   . Leukocytosis   . Sickle cell pain crisis ([place]) 02/15/2013  . Abdominal pain 07/25/2012  . Asthma, mild intermittent 12/14/2010    Past Surgical History:  Procedure Laterality Date  . SPLENECTOMY     Age 51 for sequestration  . TONSILLECTOMY     Age 22     OB History   No obstetric history on file.     Family History  Problem Relation Age of Onset  . Hypertension Paternal Grandfather   . Sickle cell trait Father   . Cancer Mother        Died in 02-05-09    Social History   Tobacco Use  . Smoking status: Current Every Day Smoker    Packs/day: 0.50    Years: 10.00    Pack years: 5.00    Types: Cigarettes  . Smokeless tobacco: Never Used  Substance Use Topics  . Alcohol use: Not Currently    Alcohol/week: 6.0 standard drinks    Types: 6 Shots of liquor per week  . Drug use: Yes    Types: Marijuana, Cocaine    Home Medications Prior to Admission medications   Medication Sig Start Date End Date Taking? Authorizing Provider  acetaminophen (TYLENOL) 500 MG tablet Take 4,000 mg by mouth every 6 (six) hours as needed for mild pain or headache.    [provider]  folic acid (FOLVITE)  1 MG tablet Take 1 tablet (1 mg total) by mouth daily. Patient not taking: Reported on 07/10/2019 03/29/19   Dorena Dew, FNP  gabapentin (NEURONTIN) 100 MG capsule Take 1 capsule (100 mg total) by mouth 3 (three) times daily. Patient not taking: Reported on 07/10/2019 03/28/19   Dorena Dew, FNP  HYDROcodone-acetaminophen (NORCO/VICODIN) 5-325 MG tablet Take 1 tablet by mouth every 6 (six) hours as needed for moderate pain. 09/09/19   Milton Ferguson, MD  ibuprofen (ADVIL) 800 MG tablet Take 1 tablet (800 mg total) by mouth every 8 (eight) hours as needed. Patient not taking: Reported on 07/08/2019 03/28/19   Dorena Dew, FNP  ondansetron (ZOFRAN) 4 MG tablet Take 1 tablet (4 mg total) by  mouth every 8 (eight) hours as needed for nausea. Patient not taking: Reported on 07/08/2019 03/28/19   Dorena Dew, FNP  oxyCODONE-acetaminophen (PERCOCET) 5-325 MG tablet Take 1 tablet by mouth every 6 (six) hours as needed for severe pain. Patient not taking: Reported on 07/08/2019 03/28/19 03/27/20  Dorena Dew, FNP    Allergies    Patient has no known allergies.  Review of Systems   Review of Systems  All other systems reviewed and are negative.   Physical Exam Updated Vital Signs BP 115/85   Pulse 71   Temp (!) 97.4 F (36.3 C) (Oral)   Resp 19   Ht 5\' 6"  (1.676 m)   Wt 51 kg   SpO2 96%   BMI 18.15 kg/m   Physical Exam Vitals and nursing note reviewed.  Constitutional:      Appearance: She is well-developed.     Comments: Patient appears uncomfortable, moaning.  HENT:     Head: Atraumatic.  Eyes:     Conjunctiva/sclera: Conjunctivae normal.  Cardiovascular:     Rate and Rhythm: Normal rate and regular rhythm.     Pulses: Normal pulses.     Heart sounds: Normal heart sounds.  Pulmonary:     Effort: Pulmonary effort is normal.     Breath sounds: Normal breath sounds.  Chest:     Chest wall: No tenderness.  Abdominal:     Tenderness: There is no abdominal tenderness.  Musculoskeletal:     Cervical back: Neck supple.  Skin:    Findings: No rash.  Neurological:     Mental Status: She is alert and oriented to person, place, and time.  Psychiatric:        Mood and Affect: Mood normal.     ED Results / Procedures / Treatments   Labs (all labs ordered are listed, but only abnormal results are displayed) Labs Reviewed  COMPREHENSIVE METABOLIC PANEL - Abnormal; Notable for the following components:      Result Value   Glucose, Bld 107 (*)    ALT 63 (*)    Total Bilirubin 2.8 (*)    All other components within normal limits  CBC WITH DIFFERENTIAL/PLATELET - Abnormal; Notable for the following components:   WBC 12.6 (*)    RBC 2.59 (*)     Hemoglobin 8.7 (*)    HCT 25.4 (*)    RDW 20.5 (*)    Platelets 673 (*)    nRBC 3.6 (*)    Neutro Abs 8.1 (*)    All other components within normal limits  RETICULOCYTES - Abnormal; Notable for the following components:   Retic Ct Pct 14.1 (*)    RBC. 2.60 (*)    Retic Count, Absolute 367.6 (*)  Immature Retic Fract 40.7 (*)    All other components within normal limits  I-STAT BETA HCG BLOOD, ED ([facility], [facility], [facility] ONLY)    EKG None  Radiology No results found.  Procedures Procedures (including critical care time)  Medications Ordered in ED Medications  ondansetron (ZOFRAN) injection 4 mg (4 mg Intravenous Given 12/22/19 0234)  ketorolac (TORADOL) 15 MG/ML injection 15 mg (15 mg Intravenous Given 12/22/19 0234)  HYDROmorphone (DILAUDID) injection 0.5 mg (0.5 mg Intravenous Given 12/22/19 0235)  HYDROmorphone (DILAUDID) injection 1 mg (1 mg Intravenous Given 12/22/19 0400)  diphenhydrAMINE (BENADRYL) injection 25 mg (25 mg Intravenous Given 12/22/19 0234)  oxyCODONE (Oxy IR/ROXICODONE) immediate release tablet 10 mg (10 mg Oral Given 12/22/19 [id number])    ED Course  I have reviewed the triage vital signs and the nursing notes.  Pertinent labs & imaging results that were available during my care of the patient were reviewed by me and considered in my medical decision making (see chart for details).    MDM Rules/Calculators/A&P                      BP 115/85   Pulse 71   Temp (!) 97.4 F (36.3 C) (Oral)   Resp 19   Ht 5\' 6"  (1.676 m)   Wt 51 kg   SpO2 96%   BMI 18.15 kg/m   Final Clinical Impression(s) / ED Diagnoses Final diagnoses:  Sickle cell crisis ([place])    Rx / DC Orders ED Discharge Orders    None     2:29 AM Patient here with pain to chest abdomen lower back bilateral legs that felt similar to prior sickle cell crisis.  No history of sickle cell anemia.  Suspect temperature changes causing her crisis.  Work-up initiated.  Will provide symptomatic treatment.  Low  suspicion for acute chest syndrome.  6:13 AM Labs are at baseline and reassuring.  Patient received multiple doses of pain medication in the ED with improvement of her symptoms.  She is stable for discharge.   Domenic Moras, PA-C 12/22/19 [id number]    [name], MD 12/22/19 [phone]

## 2019-12-22 NOTE — ED Notes (Signed)
Have pt call Lennette Bihari

## 2020-01-14 ENCOUNTER — Emergency Department (HOSPITAL_COMMUNITY)
Admission: EM | Admit: 2020-01-14 | Discharge: 2020-01-14 | Disposition: A | Discharge status: Home / Self Care | Payer: Medicaid Other | Attending: Emergency Medicine | Admitting: Emergency Medicine

## 2020-01-14 ENCOUNTER — Other Ambulatory Visit: Payer: Self-pay

## 2020-01-14 ENCOUNTER — Encounter (HOSPITAL_COMMUNITY): Payer: Self-pay

## 2020-01-14 DIAGNOSIS — F1210 Cannabis abuse, uncomplicated: Secondary | ICD-10-CM | POA: Insufficient documentation

## 2020-01-14 DIAGNOSIS — F17210 Nicotine dependence, cigarettes, uncomplicated: Secondary | ICD-10-CM | POA: Insufficient documentation

## 2020-01-14 DIAGNOSIS — D5700 Hb-SS disease with crisis, unspecified: Secondary | ICD-10-CM | POA: Diagnosis not present

## 2020-01-14 DIAGNOSIS — R4689 Other symptoms and signs involving appearance and behavior: Secondary | ICD-10-CM | POA: Insufficient documentation

## 2020-01-14 DIAGNOSIS — F1410 Cocaine abuse, uncomplicated: Secondary | ICD-10-CM | POA: Diagnosis not present

## 2020-01-14 DIAGNOSIS — Z590 Homelessness: Secondary | ICD-10-CM | POA: Insufficient documentation

## 2020-01-14 DIAGNOSIS — R52 Pain, unspecified: Secondary | ICD-10-CM | POA: Diagnosis present

## 2020-01-14 MED ORDER — DIPHENHYDRAMINE HCL 25 MG PO CAPS: 25.0000 mg | ORAL_CAPSULE | Freq: Four times a day (QID) | ORAL | Status: DC | PRN

## 2020-01-14 MED ORDER — MORPHINE SULFATE (PF) 4 MG/ML IV SOLN: 8.0000 mg | Freq: Once | INTRAVENOUS | Status: DC

## 2020-01-14 MED ORDER — KETOROLAC TROMETHAMINE 30 MG/ML IJ SOLN: 15.0000 mg | Freq: Once | INTRAMUSCULAR | Status: DC

## 2020-01-14 NOTE — Discharge Instructions (Addendum)
If your pain persists, we recommend follow-up with the sickle cell day center and/or the [facility], [place] your primary care provider

## 2020-01-14 NOTE — ED Provider Notes (Signed)
[facility] DEPT Provider Note   CSN: [id number] Arrival date & time: 01/14/20  0441     History No chief complaint on file.   [name] is a 24 y.o. female.  24 year old female with a history of sickle cell SS anemia, acute chest syndrome, cocaine abuse, and homelessness presents to the emergency department complaining of pain in her legs and arms.  States that symptoms began a few hours ago.  Expresses believes that she is in crisis.  She reported in triage taking tramadol for her pain.  To me, she states that she has had no relief from oxycodone.  Denies any chest pain, shortness of breath, fevers.  Is quite difficult to obtain a history from as patient continues to fall asleep and is quite apathetic towards her encounter.  The history is provided by the patient. No language interpreter was used.       Past Medical History:  Diagnosis Date  . Acute kidney injury ([place]) 05/15/2016  . Amenorrhea   . Anemia    SICKLE CELL  . Asthma   . Drug-induced pruritus 02/26/2015  . GERD (gastroesophageal reflux disease)   . Headache   . Hyperbilirubinemia 02/26/2015  . Sickle cell disease [facility])     Patient Active Problem List   Diagnosis Date Noted  . Community acquired pneumonia 07/08/2019  . Cocaine-induced mood disorder ([place])   . Generalized anxiety disorder   . Cocaine abuse ([place])   . Elevated liver enzymes 05/17/2018  . Sickle cell anemia ([place]) 05/16/2018  . Sickle cell crisis ([place]) 05/16/2018  . Sickle cell anemia with crisis ([place]) 12/16/2017  . Substance abuse ([place]) 12/16/2017  . Homelessness 02/12/2017  . Protein-calorie malnutrition, severe 10/21/2016  . Anemia of chronic disease   . Hb-SS disease without crisis ([place])   . 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 Tobacco abuse   . Leukocytosis   . Sickle cell pain crisis ([place]) 02/15/2013  . Abdominal pain 07/25/2012    . Asthma, mild intermittent 12/14/2010    Past Surgical History:  Procedure Laterality Date  . 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 TONSILLECTOMY     Age 59     OB History   No obstetric history on file.     Family History  Problem Relation Age of Onset  . Hypertension Paternal Grandfather   . Sickle cell trait Father   . Cancer Mother        Died in 2009-02-04    Social History   Tobacco Use  . Smoking status: Current Every Day Smoker    Packs/day: 0.50    Years: 10.00    Pack years: 5.00    Types: Cigarettes  . Smokeless tobacco: Never Used  Substance Use Topics  . Alcohol use: Not Currently    Alcohol/week: 6.0 standard drinks    Types: 6 Shots of liquor per week  . Drug use: Yes    Types: Marijuana, Cocaine    Home Medications Prior to Admission medications   Medication Sig Start Date End Date Taking? Authorizing Provider  folic acid (FOLVITE) 1 MG tablet Take 1 tablet (1 mg total) by mouth daily. Patient not taking: Reported on 07/10/2019 03/29/19   Dorena Dew, FNP  gabapentin (NEURONTIN) 100 MG capsule Take 1 capsule (100 mg total) by mouth 3 (three) times daily. Patient not taking: Reported on 07/10/2019 03/28/19   Smith Robert,  Asencion Partridge, FNP  HYDROcodone-acetaminophen (NORCO/VICODIN) 5-325 MG tablet Take 1 tablet by mouth every 6 (six) hours as needed for moderate pain. Patient not taking: Reported on 12/22/2019 09/09/19   Milton Ferguson, MD  ibuprofen (ADVIL) 800 MG tablet Take 1 tablet (800 mg total) by mouth every 8 (eight) hours as needed. Patient not taking: Reported on 07/08/2019 03/28/19   Dorena Dew, FNP  ondansetron (ZOFRAN) 4 MG tablet Take 1 tablet (4 mg total) by mouth every 8 (eight) hours as needed for nausea. Patient not taking: Reported on 07/08/2019 03/28/19   Dorena Dew, FNP  oxyCODONE-acetaminophen (PERCOCET) 5-325 MG tablet Take 1 tablet by mouth every 6 (six) hours as needed for severe pain. Patient not taking: Reported on  07/08/2019 03/28/19 03/27/20  Dorena Dew, FNP    Allergies    Patient has no known allergies.  Review of Systems   Review of Systems  Ten systems reviewed and are negative for acute change, except as noted in the HPI.    Physical Exam Updated Vital Signs BP (!) 103/56   Pulse 92   Temp 98.1 F (36.7 C)   Resp 13   SpO2 96%   Physical Exam Vitals and nursing note reviewed.  Constitutional:      General: She is not in acute distress.    Appearance: She is well-developed. She is not diaphoretic.     Comments: Patient appears disheveled. More intent on sleeping than participating in history. Agitated when stimulated.  HENT:     Head: Normocephalic and atraumatic.  Eyes:     General: No scleral icterus.    Conjunctiva/sclera: Conjunctivae normal.  Cardiovascular:     Rate and Rhythm: Normal rate and regular rhythm.     Pulses: Normal pulses.  Pulmonary:     Effort: Pulmonary effort is normal. No respiratory distress.     Comments: Respirations even and unlabored Abdominal:     General: There is no distension.  Musculoskeletal:        General: Normal range of motion.     Cervical back: Normal range of motion.  Skin:    General: Skin is warm and dry.     Coloration: Skin is not pale.     Findings: No erythema or rash.  Neurological:     Mental Status: She is oriented to person, place, and time.     Coordination: Coordination normal.     Comments: Moving all extremities spontaneously.  Psychiatric:        Attention and Perception: She is inattentive.        Behavior: Behavior is uncooperative and withdrawn.     ED Results / Procedures / Treatments   Labs (all labs ordered are listed, but only abnormal results are displayed) Labs Reviewed - No data to display  EKG None  Radiology No results found.  Procedures Procedures (including critical care time)  Medications Ordered in ED Medications - No data to display  ED Course  I have reviewed the triage  vital signs and the nursing notes.  Pertinent labs & imaging results that were available during my care of the patient were reviewed by me and considered in my medical decision making (see chart for details).  Clinical Course as of Jan 13 630  Wed Jan 14, 2020  0628 RN reattempting labs and work up. Patient continues to sleep. She jerks her arm away with any IV stick. Patient agitated with any form of stimulation and will then fall back to  sleep. VSS with sats of 97%. HR in the 80's. BP [id number] systolic. Do not feel that patient is clinically in acute crisis. Given her lack of interest in participating during her ED encounter without evidence of other life-threatening or emergent process, plan for discharge with instruction to f/u with the sickle cell day hospital if symptoms persist.   [KH]    Clinical Course User Index [KH] Beverely Pace   MDM Rules/Calculators/A&P                      24 year old female presents to the emergency department for evaluation of extremity pain.  She states that her pain is related to sickle cell pain crisis, but is otherwise unwilling to participate in her ED encounter.  Nursing has tried multiple times to obtain blood work and to establish an IV, but patient continues to jerk away her arm with any attempt at access.  Patient answering little questions otherwise; appears intent on sleeping.  She has stable vital signs, is in no acute distress.  No visible or audible signs of discomfort.  Feel it is reasonable for the patient to continue with outpatient follow-up regarding her sickle cell.  I have encouraged her to present to the day hospital for management of crisis if symptoms persist.  Discharged in stable condition.   Final Clinical Impression(s) / ED Diagnoses Final diagnoses:  Sickle cell anemia with pain [facility])    Rx / DC Orders ED Discharge Orders    None       Antonietta Breach, PA-C 01/15/20 0028    Molpus, Jenny Reichmann, MD 01/15/20 [phone]

## 2020-01-14 NOTE — ED Triage Notes (Signed)
Pt complains of pain in legs and arms, pt states no relief with tramadol

## 2020-01-14 NOTE — ED Notes (Signed)
Patient will not respond to voice, but when sternal rubbed patient awaken and told me to "get the fuck on" Told her she had been discharged and she needed to get ready to leave room, told her police would be involved and she stated "she does not give a damn"

## 2020-01-15 ENCOUNTER — Encounter (HOSPITAL_COMMUNITY): Payer: Self-pay

## 2020-01-15 ENCOUNTER — Emergency Department (HOSPITAL_COMMUNITY)
Admission: EM | Admit: 2020-01-15 | Discharge: 2020-01-15 | Disposition: A | Discharge status: Home / Self Care | Payer: Medicaid Other | Attending: Emergency Medicine | Admitting: Emergency Medicine

## 2020-01-15 ENCOUNTER — Other Ambulatory Visit: Payer: Self-pay

## 2020-01-15 DIAGNOSIS — F919 Conduct disorder, unspecified: Secondary | ICD-10-CM | POA: Diagnosis not present

## 2020-01-15 DIAGNOSIS — D5780 Other sickle-cell disorders without crisis: Secondary | ICD-10-CM | POA: Insufficient documentation

## 2020-01-15 DIAGNOSIS — R400 Somnolence: Secondary | ICD-10-CM | POA: Diagnosis not present

## 2020-01-15 DIAGNOSIS — M549 Dorsalgia, unspecified: Secondary | ICD-10-CM | POA: Insufficient documentation

## 2020-01-15 DIAGNOSIS — R52 Pain, unspecified: Secondary | ICD-10-CM

## 2020-01-15 DIAGNOSIS — R4689 Other symptoms and signs involving appearance and behavior: Secondary | ICD-10-CM

## 2020-01-15 DIAGNOSIS — F17210 Nicotine dependence, cigarettes, uncomplicated: Secondary | ICD-10-CM | POA: Insufficient documentation

## 2020-01-15 LAB — CBC WITH DIFFERENTIAL/PLATELET
Abs Immature Granulocytes: 0.38 10*3/uL — ABNORMAL HIGH (ref 0.00–0.07)
Basophils Absolute: 0.1 10*3/uL (ref 0.0–0.1)
Basophils Relative: 1 %
Eosinophils Absolute: 0.2 10*3/uL (ref 0.0–0.5)
Eosinophils Relative: 1 %
HCT: 24.1 % — ABNORMAL LOW (ref 36.0–46.0)
Hemoglobin: 8.7 g/dL — ABNORMAL LOW (ref 12.0–15.0)
Immature Granulocytes: 2 %
Lymphocytes Relative: 47 %
Lymphs Abs: 8.3 10*3/uL — ABNORMAL HIGH (ref 0.7–4.0)
MCH: 36.1 pg — ABNORMAL HIGH (ref 26.0–34.0)
MCHC: 36.1 g/dL — ABNORMAL HIGH (ref 30.0–36.0)
MCV: 100 fL (ref 80.0–100.0)
Monocytes Absolute: 1.1 10*3/uL — ABNORMAL HIGH (ref 0.1–1.0)
Monocytes Relative: 6 %
Neutro Abs: 7.6 10*3/uL (ref 1.7–7.7)
Neutrophils Relative %: 43 %
Platelets: 498 10*3/uL — ABNORMAL HIGH (ref 150–400)
RBC: 2.41 MIL/uL — ABNORMAL LOW (ref 3.87–5.11)
RDW: 22.5 % — ABNORMAL HIGH (ref 11.5–15.5)
WBC: 17.6 10*3/uL — ABNORMAL HIGH (ref 4.0–10.5)
nRBC: 3.3 % — ABNORMAL HIGH (ref 0.0–0.2)

## 2020-01-15 LAB — COMPREHENSIVE METABOLIC PANEL
ALT: 27 U/L (ref 0–44)
AST: 41 U/L (ref 15–41)
Albumin: 4 g/dL (ref 3.5–5.0)
Alkaline Phosphatase: 82 U/L (ref 38–126)
Anion gap: 4 — ABNORMAL LOW (ref 5–15)
BUN: 12 mg/dL (ref 6–20)
CO2: 28 mmol/L (ref 22–32)
Calcium: 9 mg/dL (ref 8.9–10.3)
Chloride: 108 mmol/L (ref 98–111)
Creatinine, Ser: 0.91 mg/dL (ref 0.44–1.00)
GFR calc Af Amer: >60 mL/min (ref >60)
GFR calc non Af Amer: >60 mL/min (ref >60)
Glucose, Bld: 110 mg/dL — ABNORMAL HIGH (ref 70–99)
Potassium: 4.8 mmol/L (ref 3.5–5.1)
Sodium: 140 mmol/L (ref 135–145)
Total Bilirubin: 4.3 mg/dL — ABNORMAL HIGH (ref 0.3–1.2)
Total Protein: 7 g/dL (ref 6.5–8.1)

## 2020-01-15 LAB — I-STAT BETA HCG BLOOD, ED (MC, WL, AP ONLY): I-stat hCG, quantitative: <5 m[IU]/mL (ref <5)

## 2020-01-15 LAB — RETICULOCYTES
Immature Retic Fract: 32.5 % — ABNORMAL HIGH (ref 2.3–15.9)
RBC.: 2.43 MIL/uL — ABNORMAL LOW (ref 3.87–5.11)
Retic Count, Absolute: 497.5 10*3/uL — ABNORMAL HIGH (ref 19.0–186.0)
Retic Ct Pct: 20.3 % — ABNORMAL HIGH (ref 0.4–3.1)

## 2020-01-15 MED ORDER — SODIUM CHLORIDE 0.45 % IV SOLN: INTRAVENOUS | Status: DC

## 2020-01-15 MED ORDER — KETOROLAC TROMETHAMINE 30 MG/ML IJ SOLN
15.0000 mg | Freq: Once | INTRAMUSCULAR | Status: AC
  Administered 2020-01-15: 15 mg via INTRAVENOUS
  Filled 2020-01-15: qty 1

## 2020-01-15 MED ORDER — NALOXONE HCL 0.4 MG/ML IJ SOLN
0.4000 mg | Freq: Once | INTRAMUSCULAR | Status: AC
  Administered 2020-01-15: 0.4 mg via INTRAVENOUS
  Filled 2020-01-15: qty 1

## 2020-01-15 NOTE — Discharge Instructions (Signed)
Your work-up today was reassuring.  Follow-up at the sickle cell clinic for reevaluation.  Return to the emergency department if any concerning signs or symptoms develop.

## 2020-01-15 NOTE — ED Triage Notes (Signed)
Pt reports generalized sickle cell pain. Pt denies N/V/D.

## 2020-01-15 NOTE — ED Notes (Signed)
Pt was not responsive. Pt given narcan, no change in arousal. Pt was sternal rubbed and became aroused and said "stop, that hurts".

## 2020-01-15 NOTE — ED Provider Notes (Signed)
[facility] DEPT Provider Note   CSN: [id number] Arrival date & time: 01/15/20  0539     History No chief complaint on file.  Level 5 caveat due to lack of patient cooperation  [name] is a 24 y.o. female with history of sickle cell disease, GERD, polysubstance abuse, generalized anxiety disorder, homelessness presents for evaluation of sickle cell pain.  History is difficult to obtain as patient is drowsy and mostly unwilling to partake in interview.  She tells me she has pain in her back, legs, and arms.  She will not tell me when her symptoms began but does state that it is similar to his sickle cell pain she has experienced previously.  She was seen and evaluated in the ED yesterday for similar complaints and was similarly apathetic.  The history is provided by the patient and medical records.       Past Medical History:  Diagnosis Date  . Acute kidney injury ([place]) 05/15/2016  . Amenorrhea   . Anemia    SICKLE CELL  . Asthma   . Drug-induced pruritus 02/26/2015  . GERD (gastroesophageal reflux disease)   . Headache   . Hyperbilirubinemia 02/26/2015  . Sickle cell disease [facility])     Patient Active Problem List   Diagnosis Date Noted  . Community acquired pneumonia 07/08/2019  . Cocaine-induced mood disorder ([place])   . Generalized anxiety disorder   . Cocaine abuse ([place])   . Elevated liver enzymes 05/17/2018  . Sickle cell anemia ([place]) 05/16/2018  . Sickle cell crisis ([place]) 05/16/2018  . Sickle cell anemia with crisis ([place]) 12/16/2017  . Substance abuse ([place]) 12/16/2017  . Homelessness 02/12/2017  . Protein-calorie malnutrition, severe 10/21/2016  . Anemia of chronic disease   . Hb-SS disease without crisis ([place])   . 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 Tobacco abuse   . Leukocytosis   . Sickle cell pain crisis ([place]) 02/15/2013  . Abdominal pain 07/25/2012    . Asthma, mild intermittent 12/14/2010    Past Surgical History:  Procedure Laterality Date  . SPLENECTOMY     Age 48 for sequestration  . TONSILLECTOMY     Age 42     OB History   No obstetric history on file.     Family History  Problem Relation Age of Onset  . Hypertension Paternal Grandfather   . Sickle cell trait Father   . Cancer Mother        Died in Feb 19, 2009    Social History   Tobacco Use  . Smoking status: Current Every Day Smoker    Packs/day: 0.50    Years: 10.00    Pack years: 5.00    Types: Cigarettes  . Smokeless tobacco: Never Used  Substance Use Topics  . Alcohol use: Not Currently    Alcohol/week: 6.0 standard drinks    Types: 6 Shots of liquor per week  . Drug use: Yes    Types: Marijuana, Cocaine    Home Medications Prior to Admission medications   Medication Sig Start Date End Date Taking? Authorizing Provider  folic acid (FOLVITE) 1 MG tablet Take 1 tablet (1 mg total) by mouth daily. Patient not taking: Reported on 07/10/2019 03/29/19   Dorena Dew, FNP  gabapentin (NEURONTIN) 100 MG capsule Take 1 capsule (100 mg total) by mouth 3 (three) times daily. Patient not taking: Reported on 07/10/2019 03/28/19   Dorena Dew,  FNP  HYDROcodone-acetaminophen (NORCO/VICODIN) 5-325 MG tablet Take 1 tablet by mouth every 6 (six) hours as needed for moderate pain. Patient not taking: Reported on 12/22/2019 09/09/19   Milton Ferguson, MD  ibuprofen (ADVIL) 800 MG tablet Take 1 tablet (800 mg total) by mouth every 8 (eight) hours as needed. Patient not taking: Reported on 07/08/2019 03/28/19   Dorena Dew, FNP  ondansetron (ZOFRAN) 4 MG tablet Take 1 tablet (4 mg total) by mouth every 8 (eight) hours as needed for nausea. Patient not taking: Reported on 07/08/2019 03/28/19   Dorena Dew, FNP  oxyCODONE-acetaminophen (PERCOCET) 5-325 MG tablet Take 1 tablet by mouth every 6 (six) hours as needed for severe pain. Patient not taking: Reported on  07/08/2019 03/28/19 03/27/20  Dorena Dew, FNP    Allergies    Patient has no known allergies.  Review of Systems   Review of Systems  Unable to perform ROS: Other    Physical Exam Updated Vital Signs BP (!) 107/53   Pulse 91   Temp 98 F (36.7 C) (Oral)   Resp 19   Ht 5\' 6"  (1.676 m)   Wt 50.8 kg   SpO2 94%   BMI 18.08 kg/m   Physical Exam Vitals and nursing note reviewed.  Constitutional:      General: She is not in acute distress.    Appearance: She is well-developed.     Comments: sleeping  HENT:     Head: Normocephalic and atraumatic.  Eyes:     General:        Right eye: No discharge.        Left eye: No discharge.     Pupils: Pupils are equal, round, and reactive to light.     Comments: Pupils are miotic, 2 mm bilaterally but reactive to light.  Neck:     Vascular: No JVD.     Trachea: No tracheal deviation.  Cardiovascular:     Rate and Rhythm: Regular rhythm. Tachycardia present.     Pulses: Normal pulses.     Comments: Mildly tachycardic Pulmonary:     Effort: Pulmonary effort is normal.     Breath sounds: Normal breath sounds.  Abdominal:     General: Bowel sounds are normal. There is no distension.     Palpations: Abdomen is soft.     Tenderness: There is no abdominal tenderness. There is no guarding or rebound.  Musculoskeletal:     Cervical back: Normal range of motion and neck supple.  Skin:    General: Skin is warm and dry.     Findings: No erythema.  Neurological:     Mental Status: She is alert.     Comments: Drowsy but easily arousable but will not answer most questions or follow most commands  Psychiatric:        Behavior: Behavior normal.     ED Results / Procedures / Treatments   Labs (all labs ordered are listed, but only abnormal results are displayed) Labs Reviewed  CBC WITH DIFFERENTIAL/PLATELET - Abnormal; Notable for the following components:      Result Value   WBC 17.6 (*)    RBC 2.41 (*)    Hemoglobin 8.7 (*)      HCT 24.1 (*)    MCH 36.1 (*)    MCHC 36.1 (*)    RDW 22.5 (*)    Platelets 498 (*)    nRBC 3.3 (*)    Lymphs Abs 8.3 (*)    Monocytes  Absolute 1.1 (*)    Abs Immature Granulocytes 0.38 (*)    All other components within normal limits  COMPREHENSIVE METABOLIC PANEL - Abnormal; Notable for the following components:   Glucose, Bld 110 (*)    Total Bilirubin 4.3 (*)    Anion gap 4 (*)    All other components within normal limits  RETICULOCYTES - Abnormal; Notable for the following components:   Retic Ct Pct 20.3 (*)    RBC. 2.43 (*)    Retic Count, Absolute 497.5 (*)    Immature Retic Fract 32.5 (*)    All other components within normal limits  I-STAT BETA HCG BLOOD, ED ([facility], [facility], [facility] ONLY)    EKG None  Radiology No results found.  Procedures Procedures (including critical care time)  Medications Ordered in ED Medications  0.45 % sodium chloride infusion ( Intravenous New Bag/Given 01/15/20 0713)  ketorolac (TORADOL) 30 MG/ML injection 15 mg (15 mg Intravenous Given 01/15/20 0607)  naloxone [facility]) injection 0.4 mg (0.4 mg Intravenous Given 01/15/20 [id number])    ED Course  I have reviewed the triage vital signs and the nursing notes.  Pertinent labs & imaging results that were available during my care of the patient were reviewed by me and considered in my medical decision making (see chart for details).    MDM Rules/Calculators/A&P                      Patient presenting reportedly for sickle cell pain crisis.  She is afebrile, initially mildly tachycardic with improvement after administration of IV fluids.  She is nontoxic in appearance.  She is drowsy but easily arousable.  She appears to intentionally ignore questions and will not follow commands but responds to painful stimuli and attempts to open up eyelids.  She was seen and evaluated in the ED yesterday with similar presentation.  Yesterday she would not allow for IV placement but was amenable to this today. She was  given toradol prior to my assessment.  Lab work reviewed and interpreted by me shows chronic leukocytosis, stable anemia, no metabolic derangements or renal insufficiency.  On reevaluation patient remains sleeping, arousable to painful stimuli but again entirely uncooperative with examination.  Her pupils are miotic but reactive to light.  She was given a dose of IV Narcan with no change. She appears to intentionally ignore my questions.  She is protecting her airway and vital signs are reassuring.  No evidence of respiratory decompensation.  I suspect that she has some secondary gain and attempting to stay out of the rain. She was seen and evaluated by Dr. Roderic Palau who agrees and recommends discharge. Will give information for sickle cell clinic for outpatient follow up. She appears hemodynamically stable and in no distress and is appropriate for discharge at this time.   Final Clinical Impression(s) / ED Diagnoses Final diagnoses:  Pain  Drowsiness  Uncooperative behavior    Rx / DC Orders ED Discharge Orders    None       Debroah Baller 01/15/20 0912    Molpus, Jenny Reichmann, MD 01/15/20 2236

## 2020-05-07 IMAGING — CR DG CHEST 2V
2 series · 2 of 2 positions shown · non-contrast
Comparison: Chest radiograph dated 07/08/2019 and CT chest dated
03/01/2019.

CLINICAL DATA: Chest pain. Patient is a smoker who has a history of
sickle cell anemia and asthma.

EXAM:
CHEST - 2 VIEW

[x chest ap]
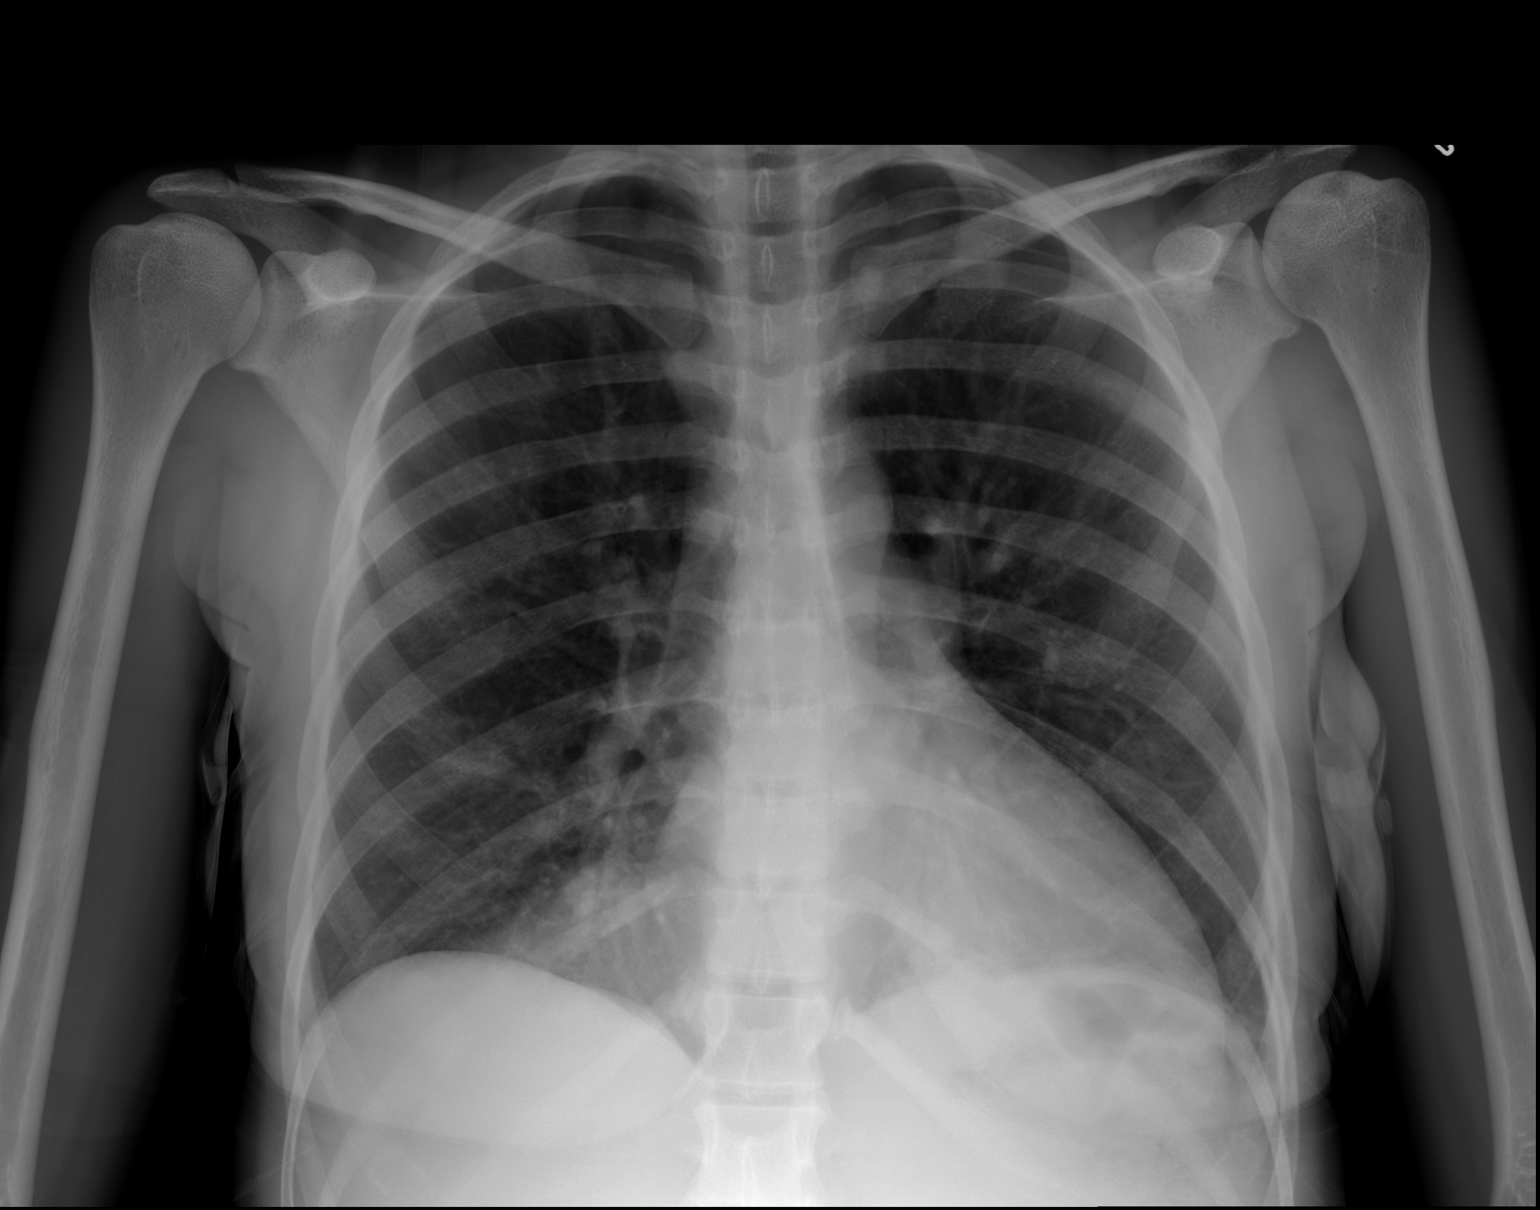

[w chest lat]
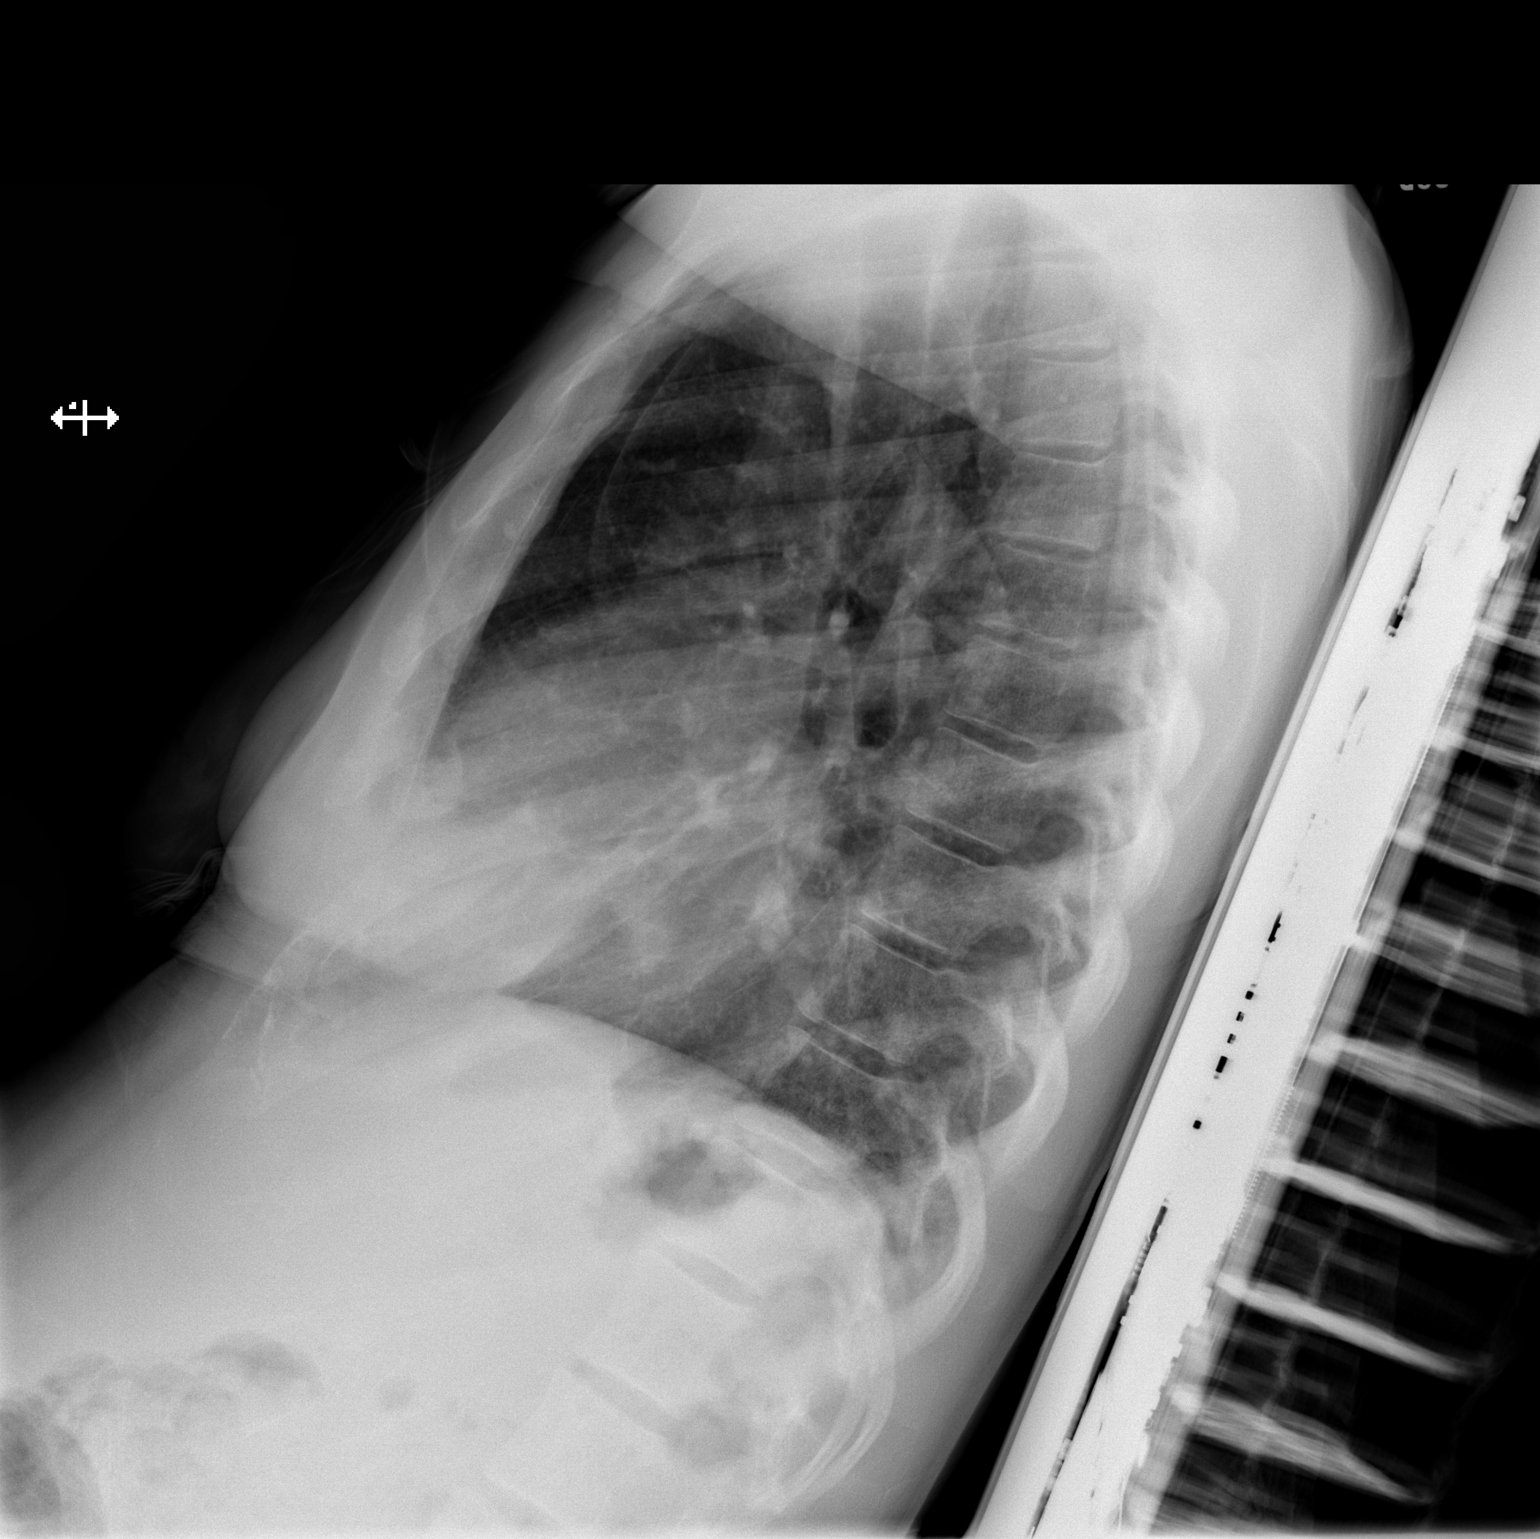

[2 of 2 positions shown; findings below may reference images not displayed]

FINDINGS: The heart size and mediastinal contours are within normal limits.
There is mild bibasilar atelectasis/airspace disease. The visualized
skeletal structures are unremarkable.
IMPRESSION: Mild bibasilar atelectasis/airspace disease may reflect pneumonia
and/or acute chest syndrome.

## 2020-06-11 ENCOUNTER — Emergency Department (HOSPITAL_COMMUNITY)
Admission: EM | Admit: 2020-06-11 | Discharge: 2020-06-11 | Disposition: A | Discharge status: Home / Self Care | Payer: Medicaid Other | Attending: Emergency Medicine | Admitting: Emergency Medicine

## 2020-06-11 ENCOUNTER — Encounter (HOSPITAL_COMMUNITY): Payer: Self-pay

## 2020-06-11 ENCOUNTER — Other Ambulatory Visit: Payer: Self-pay

## 2020-06-11 DIAGNOSIS — M79601 Pain in right arm: Secondary | ICD-10-CM | POA: Insufficient documentation

## 2020-06-11 DIAGNOSIS — M545 Low back pain: Secondary | ICD-10-CM | POA: Insufficient documentation

## 2020-06-11 DIAGNOSIS — Z5321 Procedure and treatment not carried out due to patient leaving prior to being seen by health care provider: Secondary | ICD-10-CM | POA: Insufficient documentation

## 2020-06-11 DIAGNOSIS — R109 Unspecified abdominal pain: Secondary | ICD-10-CM | POA: Insufficient documentation

## 2020-06-11 DIAGNOSIS — D57219 Sickle-cell/Hb-C disease with crisis, unspecified: Secondary | ICD-10-CM | POA: Diagnosis present

## 2020-06-11 NOTE — ED Triage Notes (Addendum)
Patient c/o sickle cell pain of the upper back, abdomen, and right arm since yesterday.  During trriage, Probation officer had to frequently wake the patient up in order to finish.

## 2020-07-08 ENCOUNTER — Encounter (HOSPITAL_COMMUNITY): Payer: Self-pay | Admitting: Emergency Medicine

## 2020-07-08 ENCOUNTER — Other Ambulatory Visit: Payer: Self-pay

## 2020-07-08 ENCOUNTER — Emergency Department (HOSPITAL_COMMUNITY)
Admission: EM | Admit: 2020-07-08 | Discharge: 2020-07-08 | Disposition: A | Discharge status: Home / Self Care | Payer: Medicaid Other | Attending: Emergency Medicine | Admitting: Emergency Medicine

## 2020-07-08 DIAGNOSIS — R109 Unspecified abdominal pain: Secondary | ICD-10-CM | POA: Diagnosis not present

## 2020-07-08 DIAGNOSIS — M545 Low back pain: Secondary | ICD-10-CM | POA: Insufficient documentation

## 2020-07-08 DIAGNOSIS — Z5321 Procedure and treatment not carried out due to patient leaving prior to being seen by health care provider: Secondary | ICD-10-CM | POA: Insufficient documentation

## 2020-07-08 DIAGNOSIS — D57219 Sickle-cell/Hb-C disease with crisis, unspecified: Secondary | ICD-10-CM | POA: Diagnosis not present

## 2020-07-08 LAB — CBC WITH DIFFERENTIAL/PLATELET
Abs Immature Granulocytes: 0.12 10*3/uL — ABNORMAL HIGH (ref 0.00–0.07)
Basophils Absolute: 0.1 10*3/uL (ref 0.0–0.1)
Basophils Relative: 0 %
Eosinophils Absolute: 0.1 10*3/uL (ref 0.0–0.5)
Eosinophils Relative: 1 %
HCT: 24.6 % — ABNORMAL LOW (ref 36.0–46.0)
Hemoglobin: 8.7 g/dL — ABNORMAL LOW (ref 12.0–15.0)
Immature Granulocytes: 1 %
Lymphocytes Relative: 28 %
Lymphs Abs: 4.1 10*3/uL — ABNORMAL HIGH (ref 0.7–4.0)
MCH: 34.5 pg — ABNORMAL HIGH (ref 26.0–34.0)
MCHC: 35.4 g/dL (ref 30.0–36.0)
MCV: 97.6 fL (ref 80.0–100.0)
Monocytes Absolute: 1.3 10*3/uL — ABNORMAL HIGH (ref 0.1–1.0)
Monocytes Relative: 9 %
Neutro Abs: 9 10*3/uL — ABNORMAL HIGH (ref 1.7–7.7)
Neutrophils Relative %: 61 %
Platelets: 494 10*3/uL — ABNORMAL HIGH (ref 150–400)
RBC: 2.52 MIL/uL — ABNORMAL LOW (ref 3.87–5.11)
RDW: 18.4 % — ABNORMAL HIGH (ref 11.5–15.5)
WBC: 14.6 10*3/uL — ABNORMAL HIGH (ref 4.0–10.5)
nRBC: 1.3 % — ABNORMAL HIGH (ref 0.0–0.2)

## 2020-07-08 LAB — COMPREHENSIVE METABOLIC PANEL
ALT: 27 U/L (ref 0–44)
AST: 42 U/L — ABNORMAL HIGH (ref 15–41)
Albumin: 3.9 g/dL (ref 3.5–5.0)
Alkaline Phosphatase: 50 U/L (ref 38–126)
Anion gap: 9 (ref 5–15)
BUN: 15 mg/dL (ref 6–20)
CO2: 24 mmol/L (ref 22–32)
Calcium: 9.1 mg/dL (ref 8.9–10.3)
Chloride: 104 mmol/L (ref 98–111)
Creatinine, Ser: 0.95 mg/dL (ref 0.44–1.00)
GFR calc Af Amer: >60 mL/min (ref >60)
GFR calc non Af Amer: >60 mL/min (ref >60)
Glucose, Bld: 115 mg/dL — ABNORMAL HIGH (ref 70–99)
Potassium: 4.4 mmol/L (ref 3.5–5.1)
Sodium: 137 mmol/L (ref 135–145)
Total Bilirubin: 4.7 mg/dL — ABNORMAL HIGH (ref 0.3–1.2)
Total Protein: 7.1 g/dL (ref 6.5–8.1)

## 2020-07-08 LAB — I-STAT BETA HCG BLOOD, ED (MC, WL, AP ONLY): I-stat hCG, quantitative: <5 m[IU]/mL (ref <5)

## 2020-07-08 LAB — RETICULOCYTES
Immature Retic Fract: 25.7 % — ABNORMAL HIGH (ref 2.3–15.9)
RBC.: 2.5 MIL/uL — ABNORMAL LOW (ref 3.87–5.11)
Retic Count, Absolute: 372.5 10*3/uL — ABNORMAL HIGH (ref 19.0–186.0)
Retic Ct Pct: 14.9 % — ABNORMAL HIGH (ref 0.4–3.1)

## 2020-07-08 LAB — LIPASE, BLOOD: Lipase: 31 U/L (ref 11–51)

## 2020-07-08 NOTE — ED Triage Notes (Signed)
Pt to triage via [facility] from home.  Reports sickle cell crisis since last night with lower back pain and abd pain.

## 2020-07-08 NOTE — ED Notes (Signed)
Called x3 

## 2021-02-03 ENCOUNTER — Emergency Department (HOSPITAL_COMMUNITY)
Admission: EM | Admit: 2021-02-03 | Discharge: 2021-02-03 | Disposition: A | Discharge status: Home / Self Care | Payer: Medicaid Other | Attending: Emergency Medicine | Admitting: Emergency Medicine

## 2021-02-03 DIAGNOSIS — Z5321 Procedure and treatment not carried out due to patient leaving prior to being seen by health care provider: Secondary | ICD-10-CM | POA: Insufficient documentation

## 2021-02-03 DIAGNOSIS — R079 Chest pain, unspecified: Secondary | ICD-10-CM | POA: Diagnosis present

## 2021-02-03 DIAGNOSIS — D5700 Hb-SS disease with crisis, unspecified: Secondary | ICD-10-CM

## 2021-02-03 NOTE — ED Triage Notes (Signed)
Per EMS- patient  Was picked up from home. patient c/o sickle cell pain in her chest x 2 days.   While getting report, patient walked out of triage and stated, "I want to leave."

## 2021-02-04 ENCOUNTER — Telehealth (HOSPITAL_COMMUNITY): Payer: Self-pay | Admitting: General Practice

## 2021-02-04 NOTE — Telephone Encounter (Signed)
Patient called, requesting to come to the day hospital due to pain in the chest rated at 9/10. Based on the time of day and location of the pain, per provider, patient was told to go to the ER. Patient notified, verbalized understanding.

## 2021-02-07 ENCOUNTER — Inpatient Hospital Stay (HOSPITAL_COMMUNITY)
Admission: EM | Admit: 2021-02-07 | Discharge: 2021-02-09 | DRG: 812 | Discharge status: Against Medical Advice | Payer: Medicaid Other | Attending: Internal Medicine | Admitting: Internal Medicine

## 2021-02-07 ENCOUNTER — Emergency Department (HOSPITAL_COMMUNITY): Payer: Medicaid Other

## 2021-02-07 ENCOUNTER — Encounter (HOSPITAL_COMMUNITY): Payer: Self-pay | Admitting: Family Medicine

## 2021-02-07 ENCOUNTER — Other Ambulatory Visit: Payer: Self-pay

## 2021-02-07 DIAGNOSIS — G894 Chronic pain syndrome: Secondary | ICD-10-CM | POA: Diagnosis present

## 2021-02-07 DIAGNOSIS — K219 Gastro-esophageal reflux disease without esophagitis: Secondary | ICD-10-CM | POA: Diagnosis present

## 2021-02-07 DIAGNOSIS — F1110 Opioid abuse, uncomplicated: Secondary | ICD-10-CM | POA: Diagnosis present

## 2021-02-07 DIAGNOSIS — F17210 Nicotine dependence, cigarettes, uncomplicated: Secondary | ICD-10-CM | POA: Diagnosis present

## 2021-02-07 DIAGNOSIS — B888 Other specified infestations: Secondary | ICD-10-CM

## 2021-02-07 DIAGNOSIS — D72829 Elevated white blood cell count, unspecified: Secondary | ICD-10-CM | POA: Diagnosis present

## 2021-02-07 DIAGNOSIS — Z8249 Family history of ischemic heart disease and other diseases of the circulatory system: Secondary | ICD-10-CM

## 2021-02-07 DIAGNOSIS — Z832 Family history of diseases of the blood and blood-forming organs and certain disorders involving the immune mechanism: Secondary | ICD-10-CM

## 2021-02-07 DIAGNOSIS — D5701 Hb-SS disease with acute chest syndrome: Secondary | ICD-10-CM | POA: Diagnosis present

## 2021-02-07 DIAGNOSIS — D638 Anemia in other chronic diseases classified elsewhere: Secondary | ICD-10-CM | POA: Diagnosis present

## 2021-02-07 DIAGNOSIS — F1410 Cocaine abuse, uncomplicated: Secondary | ICD-10-CM | POA: Diagnosis present

## 2021-02-07 DIAGNOSIS — F17200 Nicotine dependence, unspecified, uncomplicated: Secondary | ICD-10-CM | POA: Diagnosis present

## 2021-02-07 DIAGNOSIS — J9811 Atelectasis: Secondary | ICD-10-CM | POA: Diagnosis present

## 2021-02-07 DIAGNOSIS — Z5329 Procedure and treatment not carried out because of patient's decision for other reasons: Secondary | ICD-10-CM | POA: Diagnosis present

## 2021-02-07 DIAGNOSIS — Z9081 Acquired absence of spleen: Secondary | ICD-10-CM | POA: Diagnosis not present

## 2021-02-07 DIAGNOSIS — F1910 Other psychoactive substance abuse, uncomplicated: Secondary | ICD-10-CM | POA: Diagnosis present

## 2021-02-07 DIAGNOSIS — Z20822 Contact with and (suspected) exposure to covid-19: Secondary | ICD-10-CM | POA: Diagnosis present

## 2021-02-07 DIAGNOSIS — R451 Restlessness and agitation: Secondary | ICD-10-CM | POA: Diagnosis not present

## 2021-02-07 DIAGNOSIS — Z79899 Other long term (current) drug therapy: Secondary | ICD-10-CM | POA: Diagnosis not present

## 2021-02-07 DIAGNOSIS — F411 Generalized anxiety disorder: Secondary | ICD-10-CM | POA: Diagnosis present

## 2021-02-07 DIAGNOSIS — J4520 Mild intermittent asthma, uncomplicated: Secondary | ICD-10-CM | POA: Diagnosis present

## 2021-02-07 DIAGNOSIS — Z720 Tobacco use: Secondary | ICD-10-CM | POA: Diagnosis present

## 2021-02-07 DIAGNOSIS — D5700 Hb-SS disease with crisis, unspecified: Secondary | ICD-10-CM

## 2021-02-07 LAB — RETICULOCYTES
Immature Retic Fract: 37.1 % — ABNORMAL HIGH (ref 2.3–15.9)
RBC.: 2.46 MIL/uL — ABNORMAL LOW (ref 3.87–5.11)
Retic Count, Absolute: 230.7 10*3/uL — ABNORMAL HIGH (ref 19.0–186.0)
Retic Ct Pct: 9.4 % — ABNORMAL HIGH (ref 0.4–3.1)

## 2021-02-07 LAB — COMPREHENSIVE METABOLIC PANEL
ALT: 24 U/L (ref 0–44)
AST: 16 U/L (ref 15–41)
Albumin: 4.1 g/dL (ref 3.5–5.0)
Alkaline Phosphatase: 79 U/L (ref 38–126)
Anion gap: 7 (ref 5–15)
BUN: 8 mg/dL (ref 6–20)
CO2: 24 mmol/L (ref 22–32)
Calcium: 9.2 mg/dL (ref 8.9–10.3)
Chloride: 106 mmol/L (ref 98–111)
Creatinine, Ser: 0.96 mg/dL (ref 0.44–1.00)
GFR, Estimated: >60 mL/min (ref >60)
Glucose, Bld: 124 mg/dL — ABNORMAL HIGH (ref 70–99)
Potassium: 4.1 mmol/L (ref 3.5–5.1)
Sodium: 137 mmol/L (ref 135–145)
Total Bilirubin: 2.2 mg/dL — ABNORMAL HIGH (ref 0.3–1.2)
Total Protein: 8.3 g/dL — ABNORMAL HIGH (ref 6.5–8.1)

## 2021-02-07 LAB — CBC WITH DIFFERENTIAL/PLATELET
Abs Immature Granulocytes: 0.08 10*3/uL — ABNORMAL HIGH (ref 0.00–0.07)
Basophils Absolute: 0.1 10*3/uL (ref 0.0–0.1)
Basophils Relative: 0 %
Eosinophils Absolute: 0.1 10*3/uL (ref 0.0–0.5)
Eosinophils Relative: 0 %
HCT: 24.4 % — ABNORMAL LOW (ref 36.0–46.0)
Hemoglobin: 8.8 g/dL — ABNORMAL LOW (ref 12.0–15.0)
Immature Granulocytes: 0 %
Lymphocytes Relative: 11 %
Lymphs Abs: 1.9 10*3/uL (ref 0.7–4.0)
MCH: 35.9 pg — ABNORMAL HIGH (ref 26.0–34.0)
MCHC: 36.1 g/dL — ABNORMAL HIGH (ref 30.0–36.0)
MCV: 99.6 fL (ref 80.0–100.0)
Monocytes Absolute: 0.9 10*3/uL (ref 0.1–1.0)
Monocytes Relative: 5 %
Neutro Abs: 15.3 10*3/uL — ABNORMAL HIGH (ref 1.7–7.7)
Neutrophils Relative %: 84 %
Platelets: 640 10*3/uL — ABNORMAL HIGH (ref 150–400)
RBC: 2.45 MIL/uL — ABNORMAL LOW (ref 3.87–5.11)
RDW: 16.4 % — ABNORMAL HIGH (ref 11.5–15.5)
WBC: 18.4 10*3/uL — ABNORMAL HIGH (ref 4.0–10.5)
nRBC: 1.5 % — ABNORMAL HIGH (ref 0.0–0.2)

## 2021-02-07 LAB — LACTIC ACID, PLASMA
Lactic Acid, Venous: 1.3 mmol/L (ref 0.5–1.9)
Lactic Acid, Venous: 0.8 mmol/L (ref 0.5–1.9)

## 2021-02-07 LAB — RESP PANEL BY RT-PCR (FLU A&B, COVID) ARPGX2
Influenza A by PCR: NEGATIVE
Influenza B by PCR: NEGATIVE
SARS Coronavirus 2 by RT PCR: NEGATIVE

## 2021-02-07 LAB — LIPASE, BLOOD: Lipase: 26 U/L (ref 11–51)

## 2021-02-07 MED ORDER — ONDANSETRON HCL 4 MG/2ML IJ SOLN: 4.0000 mg | Freq: Four times a day (QID) | INTRAMUSCULAR | Status: DC | PRN

## 2021-02-07 MED ORDER — ENOXAPARIN SODIUM 40 MG/0.4ML ~~LOC~~ SOLN
40.0000 mg | SUBCUTANEOUS | Status: DC
  Administered 2021-02-08: 40 mg via SUBCUTANEOUS
  Filled 2021-02-07 (×2): qty 0.4

## 2021-02-07 MED ORDER — DIPHENHYDRAMINE HCL 25 MG PO CAPS
50.0000 mg | ORAL_CAPSULE | Freq: Once | ORAL | Status: AC
  Administered 2021-02-07: 50 mg via ORAL
  Filled 2021-02-07: qty 2

## 2021-02-07 MED ORDER — SODIUM CHLORIDE 0.9% FLUSH: 9.0000 mL | INTRAVENOUS | Status: DC | PRN

## 2021-02-07 MED ORDER — KETOROLAC TROMETHAMINE 15 MG/ML IJ SOLN
15.0000 mg | Freq: Four times a day (QID) | INTRAMUSCULAR | Status: DC
  Administered 2021-02-07 – 2021-02-08 (×4): 15 mg via INTRAVENOUS
  Filled 2021-02-07 (×4): qty 1

## 2021-02-07 MED ORDER — SODIUM CHLORIDE 0.9 % IV SOLN
500.0000 mg | INTRAVENOUS | Status: DC
  Administered 2021-02-08: 500 mg via INTRAVENOUS
  Filled 2021-02-07: qty 500

## 2021-02-07 MED ORDER — DIPHENHYDRAMINE HCL 12.5 MG/5ML PO ELIX: 12.5000 mg | ORAL_SOLUTION | Freq: Four times a day (QID) | ORAL | Status: DC | PRN

## 2021-02-07 MED ORDER — ACETAMINOPHEN 500 MG PO TABS
1000.0000 mg | ORAL_TABLET | Freq: Once | ORAL | Status: AC
  Administered 2021-02-07: 1000 mg via ORAL
  Filled 2021-02-07: qty 2

## 2021-02-07 MED ORDER — GABAPENTIN 100 MG PO CAPS
100.0000 mg | ORAL_CAPSULE | Freq: Three times a day (TID) | ORAL | Status: DC
  Administered 2021-02-07 – 2021-02-09 (×7): 100 mg via ORAL
  Filled 2021-02-07 (×7): qty 1

## 2021-02-07 MED ORDER — SODIUM CHLORIDE 0.9 % IV SOLN
500.0000 mg | Freq: Once | INTRAVENOUS | Status: AC
  Administered 2021-02-07: 500 mg via INTRAVENOUS
  Filled 2021-02-07: qty 500

## 2021-02-07 MED ORDER — HYDROMORPHONE HCL 2 MG/ML IJ SOLN
2.0000 mg | INTRAMUSCULAR | Status: DC | PRN
Start: 2021-02-07 — End: 2021-02-07
  Administered 2021-02-07: 2 mg via INTRAVENOUS
  Filled 2021-02-07: qty 1

## 2021-02-07 MED ORDER — SODIUM CHLORIDE 0.9 % IV SOLN
2.0000 g | INTRAVENOUS | Status: DC
  Administered 2021-02-08: 2 g via INTRAVENOUS
  Filled 2021-02-07: qty 2

## 2021-02-07 MED ORDER — SENNOSIDES-DOCUSATE SODIUM 8.6-50 MG PO TABS
1.0000 | ORAL_TABLET | Freq: Two times a day (BID) | ORAL | Status: DC
  Administered 2021-02-07 – 2021-02-09 (×5): 1 via ORAL
  Filled 2021-02-07 (×5): qty 1

## 2021-02-07 MED ORDER — ONDANSETRON HCL 4 MG/2ML IJ SOLN
4.0000 mg | Freq: Once | INTRAMUSCULAR | Status: AC
  Administered 2021-02-07: 4 mg via INTRAVENOUS
  Filled 2021-02-07: qty 2

## 2021-02-07 MED ORDER — HYDROMORPHONE 1 MG/ML IV SOLN
INTRAVENOUS | Status: DC
  Administered 2021-02-07: 0.9 mg via INTRAVENOUS
  Administered 2021-02-07: 0 mg via INTRAVENOUS
  Administered 2021-02-07: 30 mg via INTRAVENOUS
  Administered 2021-02-08: 1.5 mg via INTRAVENOUS
  Filled 2021-02-07: qty 30

## 2021-02-07 MED ORDER — SODIUM CHLORIDE 0.45 % IV SOLN: INTRAVENOUS | Status: DC

## 2021-02-07 MED ORDER — FOLIC ACID 1 MG PO TABS
1.0000 mg | ORAL_TABLET | Freq: Every day | ORAL | Status: DC
  Administered 2021-02-08 – 2021-02-09 (×2): 1 mg via ORAL
  Filled 2021-02-07 (×2): qty 1

## 2021-02-07 MED ORDER — SODIUM CHLORIDE 0.9 % IV SOLN
2.0000 g | Freq: Once | INTRAVENOUS | Status: AC
  Administered 2021-02-07: 2 g via INTRAVENOUS
  Filled 2021-02-07: qty 2
  Filled 2021-02-07 (×2): qty 20

## 2021-02-07 MED ORDER — NALOXONE HCL 0.4 MG/ML IJ SOLN: 0.4000 mg | INTRAMUSCULAR | Status: DC | PRN

## 2021-02-07 MED ORDER — POLYETHYLENE GLYCOL 3350 17 G PO PACK: 17.0000 g | PACK | Freq: Every day | ORAL | Status: DC | PRN

## 2021-02-07 NOTE — ED Provider Notes (Signed)
[facility] DEPT Provider Note   CSN: [id number] Arrival date & time: 02/07/21  0947     History Chief Complaint  Patient presents with  . Sickle Cell Pain Crisis    [name] is a 25 y.o. female.  25 yo F with a chief complaint of sickle cell pain crisis.  Going on for about a week now.  Worsening pain throughout the night.  Taking her home medications without improvement.  Complaining mostly of left-sided pain.  Also having some abdominal pain and cough.  No reported fevers at home.  No chest pain.  No areas of pain that she typically does not get for sickle cell.  The history is provided by the patient.  Sickle Cell Pain Crisis Location:  Abdomen and L side Severity:  Severe Onset quality:  Gradual Duration:  1 week Similar to previous crisis episodes: yes   Timing:  Constant Progression:  Worsening Chronicity:  Recurrent Relieved by:  Nothing Worsened by:  Nothing Ineffective treatments:  None tried Associated symptoms: cough   Associated symptoms: no chest pain, no congestion, no fever, no headaches, no nausea, no shortness of breath, no vomiting and no wheezing        Past Medical History:  Diagnosis Date  . Acute kidney injury ([place]) 05/15/2016  . Amenorrhea   . Anemia    SICKLE CELL  . Asthma   . Drug-induced pruritus 02/26/2015  . GERD (gastroesophageal reflux disease)   . Headache   . Hyperbilirubinemia 02/26/2015  . Sickle cell disease [facility])     Patient Active Problem List   Diagnosis Date Noted  . Sickle cell crisis acute chest syndrome ([place]) 02/07/2021  . Community acquired pneumonia 07/08/2019  . Cocaine-induced mood disorder ([place])   . Generalized anxiety disorder   . Cocaine abuse ([place])   . Elevated liver enzymes 05/17/2018  . Sickle cell anemia ([place]) 05/16/2018  . Sickle cell crisis ([place]) 05/16/2018  . Sickle cell anemia with crisis ([place]) 12/16/2017  . Substance abuse ([place]) 12/16/2017  . Homelessness  02/12/2017  . Protein-calorie malnutrition, severe 10/21/2016  . Anemia of chronic disease   . Hb-SS disease without crisis ([place])   . Hereditary hemolytic anemia ([place])   . 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 Tobacco abuse   . Leukocytosis   . Sickle cell pain crisis ([place]) 02/15/2013  . Abdominal pain 07/25/2012  . Asthma, mild intermittent 12/14/2010    Past Surgical History:  Procedure Laterality Date  . 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 TONSILLECTOMY     Age 83     OB History   No obstetric history on file.     Family History  Problem Relation Age of Onset  . Hypertension Paternal Grandfather   . Sickle cell trait Father   . Cancer Mother        Died in Feb 03, 2009    Social History   Tobacco Use  . Smoking status: Current Every Day Smoker    Packs/day: 0.50    Years: 10.00    Pack years: 5.00    Types: Cigarettes  . Smokeless tobacco: Never Used  Vaping Use  . Vaping Use: Never used  Substance Use Topics  . Alcohol use: Not Currently    Alcohol/week: 6.0 standard drinks    Types: 6 Shots of liquor per week  . Drug use: Yes    Types: Marijuana, Cocaine    Home Medications Prior  to Admission medications   Medication Sig Start Date End Date Taking? Authorizing Provider  folic acid (FOLVITE) 1 MG tablet Take 1 tablet (1 mg total) by mouth daily. Patient not taking: Reported on 07/10/2019 03/29/19   Dorena Dew, FNP  gabapentin (NEURONTIN) 100 MG capsule Take 1 capsule (100 mg total) by mouth 3 (three) times daily. Patient not taking: Reported on 07/10/2019 03/28/19   Dorena Dew, FNP  HYDROcodone-acetaminophen (NORCO/VICODIN) 5-325 MG tablet Take 1 tablet by mouth every 6 (six) hours as needed for moderate pain. Patient not taking: Reported on 12/22/2019 09/09/19   Milton Ferguson, MD  ibuprofen (ADVIL) 800 MG tablet Take 1 tablet (800 mg total) by mouth every 8 (eight) hours as needed. Patient not taking: Reported on  07/08/2019 03/28/19   Dorena Dew, FNP  ondansetron (ZOFRAN) 4 MG tablet Take 1 tablet (4 mg total) by mouth every 8 (eight) hours as needed for nausea. Patient not taking: Reported on 07/08/2019 03/28/19   Dorena Dew, FNP    Allergies    Patient has no known allergies.  Review of Systems   Review of Systems  Constitutional: Negative for chills and fever.  HENT: Negative for congestion and rhinorrhea.   Eyes: Negative for redness and visual disturbance.  Respiratory: Positive for cough. Negative for shortness of breath and wheezing.   Cardiovascular: Negative for chest pain and palpitations.  Gastrointestinal: Positive for abdominal pain. Negative for nausea and vomiting.  Genitourinary: Negative for dysuria and urgency.  Musculoskeletal: Positive for arthralgias and myalgias.  Skin: Negative for pallor and wound.  Neurological: Negative for dizziness and headaches.    Physical Exam Updated Vital Signs BP 113/60   Pulse (!) 105   Temp (!) 101 F (38.3 C) (Oral)   Resp 19   Ht 5\' 4"  (1.626 m)   Wt 61 kg   SpO2 97%   BMI 23.08 kg/m   Physical Exam Vitals and nursing note reviewed.  Constitutional:      General: She is not in acute distress.    Appearance: She is well-developed. She is not diaphoretic.  HENT:     Head: Normocephalic and atraumatic.  Eyes:     Pupils: Pupils are equal, round, and reactive to light.  Cardiovascular:     Rate and Rhythm: Normal rate and regular rhythm.     Heart sounds: No murmur heard. No friction rub. No gallop.   Pulmonary:     Effort: Pulmonary effort is normal.     Breath sounds: No wheezing or rales.  Abdominal:     General: There is no distension.     Palpations: Abdomen is soft.     Tenderness: There is abdominal tenderness (diffuse).  Musculoskeletal:        General: No tenderness.     Cervical back: Normal range of motion and neck supple.  Skin:    General: Skin is warm and dry.  Neurological:     Mental Status:  She is alert and oriented to person, place, and time.  Psychiatric:        Behavior: Behavior normal.     ED Results / Procedures / Treatments   Labs (all labs ordered are listed, but only abnormal results are displayed) Labs Reviewed  CBC WITH DIFFERENTIAL/PLATELET - Abnormal; Notable for the following components:      Result Value   WBC 18.4 (*)    RBC 2.45 (*)    Hemoglobin 8.8 (*)    HCT 24.4 (*)  MCH 35.9 (*)    MCHC 36.1 (*)    RDW 16.4 (*)    Platelets 640 (*)    nRBC 1.5 (*)    Neutro Abs 15.3 (*)    Abs Immature Granulocytes 0.08 (*)    All other components within normal limits  RETICULOCYTES - Abnormal; Notable for the following components:   Retic Ct Pct 9.4 (*)    RBC. 2.46 (*)    Retic Count, Absolute 230.7 (*)    Immature Retic Fract 37.1 (*)    All other components within normal limits  COMPREHENSIVE METABOLIC PANEL - Abnormal; Notable for the following components:   Glucose, Bld 124 (*)    Total Protein 8.3 (*)    Total Bilirubin 2.2 (*)    All other components within normal limits  RESP PANEL BY RT-PCR (FLU A&B, COVID) ARPGX2  CULTURE, BLOOD (ROUTINE X 2)  CULTURE, BLOOD (ROUTINE X 2)  URINE CULTURE  LIPASE, BLOOD  LACTIC ACID, PLASMA  LACTIC ACID, PLASMA  URINALYSIS, ROUTINE W REFLEX MICROSCOPIC  I-STAT BETA HCG BLOOD, ED ([facility], [facility], [facility] ONLY)    EKG None  Radiology DG Chest Port 1 View  Result Date: 02/07/2021 CLINICAL DATA:  Cough and fever.  Sickle cell disease EXAM: PORTABLE CHEST 1 VIEW COMPARISON:  July 10, 2019 FINDINGS: There is airspace opacity in the left lower lobe. There is bibasilar atelectasis. There is mild cardiomegaly with pulmonary vascularity normal. No adenopathy appreciable. There is sclerosis noted in the right humeral head consistent with known sickle cell disease. IMPRESSION: Airspace opacity most likely representing pneumonia left base region. There is bibasilar atelectasis as well. Stable cardiac prominence. Pulmonary  vascularity normal. No adenopathy. Sclerosis in right humeral head consistent with sickle cell disease. Electronically Signed   By: Lowella Grip III M.D.   On: 02/07/2021 10:20    Procedures Procedures   Medications Ordered in ED Medications  0.45 % sodium chloride infusion ( Intravenous New Bag/Given 02/07/21 1021)  HYDROmorphone (DILAUDID) injection 2 mg (2 mg Intravenous Given 02/07/21 1023)  cefTRIAXone (ROCEPHIN) 2 g in sodium chloride 0.9 % 100 mL IVPB (has no administration in time range)  acetaminophen (TYLENOL) tablet 1,000 mg (1,000 mg Oral Given 02/07/21 1022)  ondansetron (ZOFRAN) injection 4 mg (4 mg Intravenous Given 02/07/21 1024)  diphenhydrAMINE (BENADRYL) capsule 50 mg (50 mg Oral Given 02/07/21 1022)  azithromycin (ZITHROMAX) 500 mg in sodium chloride 0.9 % 250 mL IVPB (500 mg Intravenous New Bag/Given 02/07/21 1034)    ED Course  I have reviewed the triage vital signs and the nursing notes.  Pertinent labs & imaging results that were available during my care of the patient were reviewed by me and considered in my medical decision making (see chart for details).    MDM Rules/Calculators/A&P                          25 yo F with a chief complaint of a sickle cell pain crisis going on for the past week.  Found here to be febrile to 101.  Will obtain a chest x-ray UA IV fluids blood work reassess.  Chest x-ray with left lower lobe infiltrate concerning for acute chest syndrome.  Started on antibiotics.  Discussed with sickle cell who will admit.    CRITICAL CARE Performed by: Cecilio Asper   Total critical care time: 35 minutes  Critical care time was exclusive of separately billable procedures and treating other patients.  Critical care was necessary to treat or prevent imminent or life-threatening deterioration.  Critical care was time spent personally by me on the following activities: development of treatment plan with patient and/or surrogate as well  as nursing, discussions with consultants, evaluation of patient's response to treatment, examination of patient, obtaining history from patient or surrogate, ordering and performing treatments and interventions, ordering and review of laboratory studies, ordering and review of radiographic studies, pulse oximetry and re-evaluation of patient's condition.  The patients results and plan were reviewed and discussed.   Any x-rays performed were independently reviewed by myself.   Differential diagnosis were considered with the presenting HPI.  Medications  0.45 % sodium chloride infusion ( Intravenous New Bag/Given 02/07/21 1021)  HYDROmorphone (DILAUDID) injection 2 mg (2 mg Intravenous Given 02/07/21 1023)  cefTRIAXone (ROCEPHIN) 2 g in sodium chloride 0.9 % 100 mL IVPB (has no administration in time range)  acetaminophen (TYLENOL) tablet 1,000 mg (1,000 mg Oral Given 02/07/21 1022)  ondansetron (ZOFRAN) injection 4 mg (4 mg Intravenous Given 02/07/21 1024)  diphenhydrAMINE (BENADRYL) capsule 50 mg (50 mg Oral Given 02/07/21 1022)  azithromycin (ZITHROMAX) 500 mg in sodium chloride 0.9 % 250 mL IVPB (500 mg Intravenous New Bag/Given 02/07/21 1034)    Vitals:   02/07/21 1000 02/07/21 1006 02/07/21 1007 02/07/21 1045  BP: 122/77   113/60  Pulse: (!) 113   (!) 105  Resp: 19     Temp:      TempSrc:  Oral    SpO2: 99%   97%  Weight:   61 kg   Height:   5\' 4"  (1.626 m)     Final diagnoses:  Sickle cell pain crisis ([place])  Acute chest syndrome ([place])    Admission/ observation were discussed with the admitting physician, patient and/or family and they are comfortable with the plan.   Final Clinical Impression(s) / ED Diagnoses Final diagnoses:  Sickle cell pain crisis ([place])  Acute chest syndrome [facility])    Rx / DC Orders ED Discharge Orders    None       Deno Etienne, DO 02/07/21 1225

## 2021-02-07 NOTE — ED Notes (Signed)
Bedbug was found on patient after being roomed. Pt was bathed and changed into a clean gown, linens were changed, and pt's clothing and belongings were placed into a clear plastic bag and labelled with pt labels. [employer] has been called per charge nurse.

## 2021-02-07 NOTE — ED Notes (Addendum)
Bedbugs noted on pt, contact precautions initiated. Charge RN made aware. Bathed by staff.

## 2021-02-07 NOTE — ED Triage Notes (Signed)
Patient c/o sickle cell pain in her left arm and left leg. Patient very drowsy in triage. Patient states she had Tramadol at home. Patient does not arouse enough to answer the questions in triage.

## 2021-02-07 NOTE — Plan of Care (Signed)

## 2021-02-07 NOTE — H&P (Signed)
H&P  [name], is a 25 y.o. female  MRN: [id number]   DOB - [birth year]  Admit Date - 02/07/2021  Outpatient Primary MD for the patient is Dorena Dew, FNP  Chief Complaint  Patient presents with  . Sickle Cell Pain Crisis      HPI:   [name]  is a 25 y.o. female with a medical history significant for sickle cell disease type SS, history of polysubstance abuse, history of acute chest syndrome, history of mild intermittent asthma, and history of anemia of chronic disease presented to the ER with complaints of left-sided pain that has persisted for greater than 1 week.   Patient was treated and evaluated in the emergency department on 02/03/2021 for sickle cell pain crisis.  Patient typically has infrequent pain crises.  She does not have medications for sickle cell, she has been lost to follow-up with her primary care provider.  She has not followed by hematology.  Patient is complaining of pain to central chest and left side.  Patient rates pain as 8/10.  She says the pain has been increasing over the past several days and unrelieved by ibuprofen.  She says that she has not had any opiate medications.  She denies any shortness of breath, dizziness, headache, fever, or chills.  No nausea, vomiting, or diarrhea.  She has had no sick contacts, recent travel, or known exposure to COVID-19.  Patient is a very poor historian due to increased drowsiness.    ER Course:  Vital signs recorded as: BP 113/60   Pulse (!) 105   Temp (!) 101 F (38.3 C) (Oral)   Resp 19   Ht 5\' 4"  (1.626 m)   Wt 61 kg   SpO2 97%   BMI 23.08 kg/m  Patient febrile, Azithromycin and Ceftriaxone initiated in ER. Chest xray shows airspace opacity most likely representing pneumonia left base region.  There is bibasilar atelectasis as well.  WBCs 18.4, hemoglobin 8.8, and platelets 640,000.  Complete metabolic panel shows sodium 137, potassium 4.1, chloride 106, CO2 24, glucose  124, creatinine 0.96, and total bilirubin 2.2.  Lipase 26.  Absolute reticulocyte count 230.7.  Blood cultures in progress.  Lactic acid 1.3.  Urine drug screen pending.  COVID-19 negative.  Influenza AMB negative.  Maximum temperature 101 F.  Pain persists despite IV Dilaudid, Tylenol, Benadryl, Zofran, and IV fluids.  Patient admitted to [facility] with CAP versus acute chest syndrome in the setting of sickle cell pain crisis.    Review of systems:  Review of Systems  Constitutional: Negative for chills and fever.  HENT: Negative.   Eyes: Negative.   Respiratory: Positive for cough. Negative for sputum production and shortness of breath.   Cardiovascular: Positive for chest pain.  Gastrointestinal: Negative.   Genitourinary: Negative.   Musculoskeletal: Positive for back pain and joint pain.  Neurological: Negative.   Endo/Heme/Allergies: Negative.   Psychiatric/Behavioral: Negative.      With Past History of the following :   Past Medical History:  Diagnosis Date  . Acute kidney injury ([place]) 05/15/2016  . Amenorrhea   . Anemia    SICKLE CELL  . Asthma   . Drug-induced pruritus 02/26/2015  . GERD (gastroesophageal reflux disease)   . Headache   . Hyperbilirubinemia 02/26/2015  . Sickle cell disease ([place])       Past Surgical History:  Procedure Laterality Date  . SPLENECTOMY     Age 51 for sequestration  . TONSILLECTOMY  Age 59     Social History:   Social History   Tobacco Use  . Smoking status: Current Every Day Smoker    Packs/day: 0.50    Years: 10.00    Pack years: 5.00    Types: Cigarettes  . Smokeless tobacco: Never Used  Substance Use Topics  . Alcohol use: Not Currently    Alcohol/week: 6.0 standard drinks    Types: 6 Shots of liquor per week     Lives - At home   Family History :   Family History  Problem Relation Age of Onset  . Hypertension Paternal Grandfather   . Sickle cell trait Father   . Cancer Mother        Died in 02/07/2009     Home  Medications:   Prior to Admission medications   Medication Sig Start Date End Date Taking? Authorizing Provider  folic acid (FOLVITE) 1 MG tablet Take 1 tablet (1 mg total) by mouth daily. Patient not taking: Reported on 07/10/2019 03/29/19   Dorena Dew, FNP  gabapentin (NEURONTIN) 100 MG capsule Take 1 capsule (100 mg total) by mouth 3 (three) times daily. Patient not taking: Reported on 07/10/2019 03/28/19   Dorena Dew, FNP  HYDROcodone-acetaminophen (NORCO/VICODIN) 5-325 MG tablet Take 1 tablet by mouth every 6 (six) hours as needed for moderate pain. Patient not taking: Reported on 12/22/2019 09/09/19   Milton Ferguson, MD  ibuprofen (ADVIL) 800 MG tablet Take 1 tablet (800 mg total) by mouth every 8 (eight) hours as needed. Patient not taking: Reported on 07/08/2019 03/28/19   Dorena Dew, FNP  ondansetron (ZOFRAN) 4 MG tablet Take 1 tablet (4 mg total) by mouth every 8 (eight) hours as needed for nausea. Patient not taking: Reported on 07/08/2019 03/28/19   Dorena Dew, FNP     Allergies:   No Known Allergies   Physical Exam:   Vitals:   Vitals:   02/07/21 1000 02/07/21 1045  BP: 122/77 113/60  Pulse: (!) 113 (!) 105  Resp: 19   Temp:    SpO2: 99% 97%    Physical Exam: Constitutional: Patient appears disheveled, ill-appearing, some distress HENT: Normocephalic, atraumatic, External right and left ear normal. Oropharynx is clear and moist.  Eyes: Conjunctivae and EOM are normal. PERRLA, no scleral icterus. Neck: Normal ROM. Neck supple. No JVD. No tracheal deviation. No thyromegaly. CVS: RRR, S1/S2 +, no murmurs, no gallops, no carotid bruit.  Pulmonary: Effort and breath sounds normal, no stridor, rhonchi, wheezes, rales.  Abdominal: Soft. BS +, no distension, tenderness, rebound or guarding.  Musculoskeletal: Normal range of motion. No edema and no tenderness.  Lymphadenopathy: No lymphadenopathy noted, cervical, inguinal or axillary Neuro: Alert.  Normal reflexes, muscle tone coordination. No cranial nerve deficit. Skin: Skin is warm and dry. No rash noted. Not diaphoretic. No erythema. No pallor. Psychiatric: Normal mood and affect. Behavior, judgment, thought content normal.   Data Review:   CBC Recent Labs  Lab 02/07/21 1013  WBC 18.4*  HGB 8.8*  HCT 24.4*  PLT 640*  MCV 99.6  MCH 35.9*  MCHC 36.1*  RDW 16.4*  LYMPHSABS 1.9  MONOABS 0.9  EOSABS 0.1  BASOSABS 0.1   ------------------------------------------------------------------------------------------------------------------  Chemistries  Recent Labs  Lab 02/07/21 1013  NA 137  K 4.1  CL 106  CO2 24  GLUCOSE 124*  BUN 8  CREATININE 0.96  CALCIUM 9.2  AST 16  ALT 24  ALKPHOS 79  BILITOT 2.2*   ------------------------------------------------------------------------------------------------------------------  estimated creatinine clearance is 76.7 mL/min (by C-G formula based on SCr of 0.96 mg/dL). ------------------------------------------------------------------------------------------------------------------ No results for input(s): TSH, T4TOTAL, T3FREE, THYROIDAB in the last 72 hours.  Invalid input(s): FREET3  Coagulation profile No results for input(s): INR, PROTIME in the last 168 hours. ------------------------------------------------------------------------------------------------------------------- No results for input(s): DDIMER in the last 72 hours. -------------------------------------------------------------------------------------------------------------------  Cardiac Enzymes No results for input(s): CKMB, TROPONINI, MYOGLOBIN in the last 168 hours.  Invalid input(s): CK ------------------------------------------------------------------------------------------------------------------ No results found for:  BNP  ---------------------------------------------------------------------------------------------------------------  Urinalysis    Component Value Date/Time   COLORURINE YELLOW 07/08/2019 0947   APPEARANCEUR HAZY (A) 07/08/2019 0947   LABSPEC 1.006 07/08/2019 0947   PHURINE 5.0 07/08/2019 0947   GLUCOSEU NEGATIVE 07/08/2019 0947   HGBUR LARGE (A) 07/08/2019 0947   BILIRUBINUR NEGATIVE 07/08/2019 [place] 07/08/2019 0947   PROTEINUR NEGATIVE 07/08/2019 0947   UROBILINOGEN 1.0 08/11/2015 1948   NITRITE NEGATIVE 07/08/2019 0947   LEUKOCYTESUR SMALL (A) 07/08/2019 0947    ----------------------------------------------------------------------------------------------------------------   Imaging Results:    DG Chest Port 1 View  Result Date: 02/07/2021 CLINICAL DATA:  Cough and fever.  Sickle cell disease EXAM: PORTABLE CHEST 1 VIEW COMPARISON:  July 10, 2019 FINDINGS: There is airspace opacity in the left lower lobe. There is bibasilar atelectasis. There is mild cardiomegaly with pulmonary vascularity normal. No adenopathy appreciable. There is sclerosis noted in the right humeral head consistent with known sickle cell disease. IMPRESSION: Airspace opacity most likely representing pneumonia left base region. There is bibasilar atelectasis as well. Stable cardiac prominence. Pulmonary vascularity normal. No adenopathy. Sclerosis in right humeral head consistent with sickle cell disease. Electronically Signed   By: Lowella Grip III M.D.   On: 02/07/2021 10:20     Assessment & Plan:  Principal Problem:   Sickle cell crisis acute chest syndrome (HCC) Active Problems:   Asthma, mild intermittent   Tobacco abuse   Leukocytosis   Substance abuse (HCC)   Generalized anxiety disorder   Infestation by bed bug  Acute chest syndrome versus CAP: Chest x-ray shows left lower lobe infiltrate and bilateral atelectasis. Supplemental oxygen at 2 L Continue empiric  antibiotics.  IV ceftriaxone and IV azithromycin Hemoglobin 8.8, no blood transfusion warranted at this time Pain control with IV Dilaudid PCA per full dose Incentive spirometer Monitor vital signs very closely, reevaluate pain scale regularly, and continue supplemental oxygen  Sickle cell anemia with pain crisis: Initiate IV Dilaudid via PCA, full dose Toradol 15 mg IV every 6 hours for total of 5 days IV fluids, 0.45% saline at 150 mL/h Signs very closely, reevaluate pain scale regularly, and supplemental oxygen as needed  Leukocytosis: WBCs 18.5.  Continue empiric antibiotics.  COVID-19 test pending.  Blood cultures pending.  Urinalysis and urine culture pending.  Follow closely.  Repeat CBC with differential in a.m.  History of generalized anxiety disorder: Patient is currently not on medications for this problem.  No suicidal homicidal intent.  No visual or auditory hallucinations today.  Continue to monitor closely.  History of polysubstance abuse: Drug screen pending.  Will review results as they become available.  DVT Prophylaxis: Subcut Lovenox   AM Labs Ordered, also please review Full Orders  Family Communication: Admission, patient's condition and plan of care including tests being ordered have been discussed with the patient who indicate understanding and agree with the plan and Code Status.  Code Status: Full Code  Consults called: None    Admission status: Inpatient  Time spent in minutes : 35 minutes  [place], MSN, [facility] [address] [place], [place] [address] [phone]   02/07/2021 at 12:56 PM

## 2021-02-07 NOTE — Progress Notes (Signed)
Pt continually taking off telemetry leads, commenting she will not keep them on.  On-call made aware. Pt admitted for Med-surg bed.

## 2021-02-08 LAB — URINALYSIS, ROUTINE W REFLEX MICROSCOPIC
Bacteria, UA: NONE SEEN
Bilirubin Urine: NEGATIVE
Glucose, UA: NEGATIVE mg/dL
Ketones, ur: NEGATIVE mg/dL
Leukocytes,Ua: NEGATIVE
Nitrite: NEGATIVE
Protein, ur: NEGATIVE mg/dL
Specific Gravity, Urine: 1.011 (ref 1.005–1.030)
pH: 5 (ref 5.0–8.0)

## 2021-02-08 LAB — RAPID URINE DRUG SCREEN, HOSP PERFORMED
Amphetamines: NOT DETECTED
Barbiturates: NOT DETECTED
Benzodiazepines: NOT DETECTED
Cocaine: POSITIVE — AB
Opiates: POSITIVE — AB
Tetrahydrocannabinol: NOT DETECTED

## 2021-02-08 LAB — CBC
HCT: 21 % — ABNORMAL LOW (ref 36.0–46.0)
Hemoglobin: 7.5 g/dL — ABNORMAL LOW (ref 12.0–15.0)
MCH: 35.2 pg — ABNORMAL HIGH (ref 26.0–34.0)
MCHC: 35.7 g/dL (ref 30.0–36.0)
MCV: 98.6 fL (ref 80.0–100.0)
Platelets: 401 10*3/uL — ABNORMAL HIGH (ref 150–400)
RBC: 2.13 MIL/uL — ABNORMAL LOW (ref 3.87–5.11)
RDW: 17.4 % — ABNORMAL HIGH (ref 11.5–15.5)
WBC: 17.4 10*3/uL — ABNORMAL HIGH (ref 4.0–10.5)
nRBC: 2.2 % — ABNORMAL HIGH (ref 0.0–0.2)

## 2021-02-08 LAB — BASIC METABOLIC PANEL
Anion gap: 10 (ref 5–15)
BUN: 12 mg/dL (ref 6–20)
CO2: 23 mmol/L (ref 22–32)
Calcium: 8.4 mg/dL — ABNORMAL LOW (ref 8.9–10.3)
Chloride: 103 mmol/L (ref 98–111)
Creatinine, Ser: 0.8 mg/dL (ref 0.44–1.00)
GFR, Estimated: >60 mL/min (ref >60)
Glucose, Bld: 98 mg/dL (ref 70–99)
Potassium: 4.1 mmol/L (ref 3.5–5.1)
Sodium: 136 mmol/L (ref 135–145)

## 2021-02-08 LAB — HIV ANTIBODY (ROUTINE TESTING W REFLEX): HIV Screen 4th Generation wRfx: NONREACTIVE

## 2021-02-08 MED ORDER — AMOXICILLIN-POT CLAVULANATE 875-125 MG PO TABS
1.0000 | ORAL_TABLET | Freq: Two times a day (BID) | ORAL | Status: DC
  Administered 2021-02-09: 1 via ORAL
  Filled 2021-02-08: qty 1

## 2021-02-08 MED ORDER — HYDROMORPHONE HCL 2 MG/ML IJ SOLN: 2.0000 mg | INTRAMUSCULAR | Status: DC | PRN

## 2021-02-08 MED ORDER — HALOPERIDOL LACTATE 5 MG/ML IJ SOLN: 2.0000 mg | Freq: Four times a day (QID) | INTRAMUSCULAR | Status: DC | PRN

## 2021-02-08 MED ORDER — OXYCODONE HCL 5 MG PO TABS
5.0000 mg | ORAL_TABLET | ORAL | Status: DC | PRN
  Administered 2021-02-08 – 2021-02-09 (×6): 5 mg via ORAL
  Filled 2021-02-08 (×6): qty 1

## 2021-02-08 NOTE — Progress Notes (Signed)
Subjective: [name] is a 25 year old female with a medical history significant for sickle cell disease, chronic pain syndrome, history of polysubstance abuse, and history of mild intermittent asthma was admitted for acute chest syndrome in the setting of sickle cell pain crisis. Today, patient is very agitated.  She is rolling from side to side in bed.  She says the pain intensity is 5/10.  Patient has utilized PCA minimally overnight.  Patient has pulled out several IVs and has not been able to maintain safety devices.   Objective:  Vital signs in last 24 hours:  Vitals:   02/08/21 1114 02/08/21 1402 02/08/21 1403 02/08/21 1655  BP:   101/63 108/70  Pulse:   (!) 110 (!) 106  Resp: 18   14  Temp:  97.9 F (36.6 C) 97.9 F (36.6 C) 98.2 F (36.8 C)  TempSrc:  Oral Oral Oral  SpO2: 100%  95% 95%  Weight:      Height:        Intake/Output from previous day:   Intake/Output Summary (Last 24 hours) at 02/08/2021 1656 Last data filed at 02/08/2021 1304 Gross per 24 hour  Intake 981.81 ml  Output 600 ml  Net 381.81 ml    Physical Exam: General: Alert, awake, oriented x3, in acute distress.  HEENT: [place]/AT PEERL, EOMI Neck: Trachea midline,  no masses, no thyromegal,y no JVD, no carotid bruit OROPHARYNX:  Moist, No exudate/ erythema/lesions.  Heart: Regular rate and rhythm, without murmurs, rubs, gallops, PMI non-displaced, no heaves or thrills on palpation.  Lungs: Clear to auscultation, no wheezing or rhonchi noted. No increased vocal fremitus resonant to percussion  Abdomen: Soft, nontender, nondistended, positive bowel sounds, no masses no hepatosplenomegaly noted..  Neuro: No focal neurological deficits noted cranial nerves II through XII grossly intact. DTRs 2+ bilaterally upper and lower extremities. Strength 5 out of 5 in bilateral upper and lower extremities. Musculoskeletal: No warm swelling or erythema around joints, no spinal tenderness noted. Psychiatric:  Patient alert and oriented x3, good insight and cognition, good recent to remote recall. Lymph node survey: No cervical axillary or inguinal lymphadenopathy noted.  Lab Results:  Basic Metabolic Panel:    Component Value Date/Time   NA 136 02/08/2021 0522   K 4.1 02/08/2021 0522   CL 103 02/08/2021 0522   CO2 23 02/08/2021 0522   BUN 12 02/08/2021 0522   CREATININE 0.80 02/08/2021 0522   GLUCOSE 98 02/08/2021 0522   CALCIUM 8.4 (L) 02/08/2021 0522   CBC:    Component Value Date/Time   WBC 17.4 (H) 02/08/2021 0522   HGB 7.5 (L) 02/08/2021 0522   HCT 21.0 (L) 02/08/2021 0522   PLT 401 (H) 02/08/2021 0522   MCV 98.6 02/08/2021 0522   NEUTROABS 15.3 (H) 02/07/2021 1013   LYMPHSABS 1.9 02/07/2021 1013   MONOABS 0.9 02/07/2021 1013   EOSABS 0.1 02/07/2021 1013   BASOSABS 0.1 02/07/2021 1013    Recent Results (from the past 240 hour(s))  Blood culture (routine x 2)     Status: None (Preliminary result)   Collection Time: 02/07/21 10:15 AM   Specimen: BLOOD  Result Value Ref Range Status   Specimen Description   Final    BLOOD LEFT ANTECUBITAL Performed at [facility], [place] [address] [place], [place] [address]    Special Requests   Final    BOTTLES DRAWN AEROBIC AND ANAEROBIC Blood Culture adequate volume Performed at [facility] [address], [place], [place] [address]  Culture   Final    NO GROWTH < 24 HOURS Performed at [facility], [place] [address] [place], [place] [address]    Report Status PENDING  Incomplete  Blood culture (routine x 2)     Status: None (Preliminary result)   Collection Time: 02/07/21 10:17 AM   Specimen: BLOOD  Result Value Ref Range Status   Specimen Description   Final    BLOOD RIGHT ANTECUBITAL Performed at [facility], [place] [address], [place], [place] [address]    Special Requests   Final    BOTTLES DRAWN AEROBIC AND ANAEROBIC Blood Culture adequate volume Performed at [facility] [address] [place], [place] [address]    Culture   Final    NO GROWTH < 24 HOURS Performed at [facility] [address], [place], [place] [address]    Report Status PENDING  Incomplete  Resp Panel by RT-PCR (Flu A&B, Covid) Nasopharyngeal Swab     Status: None   Collection Time: 02/07/21 10:40 AM   Specimen: Nasopharyngeal Swab; Nasopharyngeal(NP) swabs in vial transport medium  Result Value Ref Range Status   SARS Coronavirus 2 by RT PCR NEGATIVE NEGATIVE Final    Comment: (NOTE) SARS-CoV-2 target nucleic acids are NOT DETECTED.  The SARS-CoV-2 RNA is generally detectable in upper respiratory specimens during the acute phase of infection. The lowest concentration of SARS-CoV-2 viral copies this assay can detect is 138 copies/mL. A negative result does not preclude SARS-Cov-2 infection and should not be used as the sole basis for treatment or other patient management decisions. A negative result may occur with  improper specimen collection/handling, submission of specimen other than nasopharyngeal swab, presence of viral mutation(s) within the areas targeted by this assay, and inadequate number of viral copies(<138 copies/mL). A negative result must be combined with clinical observations, patient history, and epidemiological information. The expected result is Negative.  Fact Sheet for Patients:  EntrepreneurPulse.com.au  Fact Sheet for Healthcare Providers:  IncredibleEmployment.be  This test is no t yet approved or cleared by the [place] FDA and  has been authorized for detection and/or diagnosis of SARS-CoV-2 by FDA under an Emergency Use Authorization (EUA). This EUA will remain  in effect (meaning this test can be used) for the duration of the COVID-19 declaration under Section 564(b)(1) of the Act, 21 U.S.C.section 360bbb-3(b)(1), unless the authorization is terminated  or revoked sooner.        Influenza A by PCR NEGATIVE NEGATIVE Final   Influenza B by PCR NEGATIVE NEGATIVE Final    Comment: (NOTE) The Xpert Xpress SARS-CoV-2/FLU/RSV plus assay is intended as an aid in the diagnosis of influenza from Nasopharyngeal swab specimens and should not be used as a sole basis for treatment. Nasal washings and aspirates are unacceptable for Xpert Xpress SARS-CoV-2/FLU/RSV testing.  Fact Sheet for Patients: EntrepreneurPulse.com.au  Fact Sheet for Healthcare Providers: IncredibleEmployment.be  This test is not yet approved or cleared by the [place] FDA and has been authorized for detection and/or diagnosis of SARS-CoV-2 by FDA under an Emergency Use Authorization (EUA). This EUA will remain in effect (meaning this test can be used) for the duration of the COVID-19 declaration under Section 564(b)(1) of the Act, 21 U.S.C. section 360bbb-3(b)(1), unless the authorization is terminated or revoked.  Performed at [facility], [place] [address] [place], [place] [address]     Studies/Results: DG Chest Port 1 View  Result Date: 02/07/2021 CLINICAL DATA:  Cough and fever.  Sickle cell disease EXAM: PORTABLE CHEST 1 VIEW COMPARISON:  July 10, 2019 FINDINGS: There is airspace opacity in the left lower lobe. There is bibasilar atelectasis. There is mild cardiomegaly with pulmonary vascularity normal. No adenopathy appreciable. There is sclerosis noted in the right humeral head consistent with known sickle cell disease. IMPRESSION: Airspace opacity most likely representing pneumonia left base region. There is bibasilar atelectasis as well. Stable cardiac prominence. Pulmonary vascularity normal. No adenopathy. Sclerosis in right humeral head consistent with sickle cell disease. Electronically Signed   By: Lowella Grip III M.D.   On: 02/07/2021 10:20    Medications: Scheduled Meds: . [START ON 02/09/2021]  amoxicillin-clavulanate  1 tablet Oral Q12H  . enoxaparin (LOVENOX) injection  40 mg Subcutaneous Q24H  . folic acid  1 mg Oral Daily  . gabapentin  100 mg Oral TID  . senna-docusate  1 tablet Oral BID   Continuous Infusions: PRN Meds:.oxyCODONE, polyethylene glycol  Consultants:  None  Procedures:  None  Antibiotics:  None  Assessment/Plan: Principal Problem:   Sickle cell crisis acute chest syndrome (HCC) Active Problems:   Asthma, mild intermittent   Tobacco abuse   Leukocytosis   Substance abuse (HCC)   Generalized anxiety disorder   Infestation by bed bug  Sickle cell disease with pain crisis: Discontinue IV Dilaudid PCA, it is been difficult for patient to maintain safety devices. Oxycodone 10 mg every 4 hours as needed for severe breakthrough pain Toradol 50 mg IV every 6 hours for total of 5 days 0.45% saline at 75 mL/h Monitor vital signs very closely, reevaluate pain scale regularly, and supplemental oxygen as needed.  Acute chest syndrome: Patient does not have an oxygen requirement at this time.  Continue empiric antibiotics.  Patient is afebrile. Continue incentive spirometer  Continue pain management  Leukocytosis: Improving.  Continue empiric antibiotics.  Blood cultures negative so far.  Urinalysis unremarkable.  Respiratory panel negative.  Continue to monitor closely.  Labs in AM.  History of polysubstance abuse: Urine drug screen positive for opiates and cocaine.  Refrain from utilizing IV pain medications.   Code Status: Full Code Family Communication: N/A Disposition Plan: Not yet ready for discharge  [place], MSN, FNP-C Patient [facility] [address] [place], [place] [address] [phone]  If 5PM-8AM, please contact night-coverage.  02/08/2021, 4:56 PM  LOS: 1 day

## 2021-02-09 LAB — CBC
HCT: 18.4 % — ABNORMAL LOW (ref 36.0–46.0)
Hemoglobin: 6.7 g/dL — CL (ref 12.0–15.0)
MCH: 35.3 pg — ABNORMAL HIGH (ref 26.0–34.0)
MCHC: 36.4 g/dL — ABNORMAL HIGH (ref 30.0–36.0)
MCV: 96.8 fL (ref 80.0–100.0)
Platelets: 666 10*3/uL — ABNORMAL HIGH (ref 150–400)
RBC: 1.9 MIL/uL — ABNORMAL LOW (ref 3.87–5.11)
RDW: 18.8 % — ABNORMAL HIGH (ref 11.5–15.5)
WBC: 18.7 10*3/uL — ABNORMAL HIGH (ref 4.0–10.5)
nRBC: 1.7 % — ABNORMAL HIGH (ref 0.0–0.2)

## 2021-02-09 LAB — URINE CULTURE: Culture: 6000 — AB

## 2021-02-09 LAB — HEMOGLOBIN AND HEMATOCRIT, BLOOD
HCT: 22.9 % — ABNORMAL LOW (ref 36.0–46.0)
Hemoglobin: 8.1 g/dL — ABNORMAL LOW (ref 12.0–15.0)

## 2021-02-09 LAB — PREPARE RBC (CROSSMATCH)

## 2021-02-09 MED ORDER — DIPHENHYDRAMINE HCL 25 MG PO CAPS
25.0000 mg | ORAL_CAPSULE | Freq: Once | ORAL | Status: AC
  Administered 2021-02-09: 25 mg via ORAL
  Filled 2021-02-09: qty 1

## 2021-02-09 MED ORDER — ACETAMINOPHEN 325 MG PO TABS
650.0000 mg | ORAL_TABLET | Freq: Once | ORAL | Status: AC
  Administered 2021-02-09: 650 mg via ORAL
  Filled 2021-02-09: qty 2

## 2021-02-09 NOTE — Progress Notes (Signed)
Patient is forgetful and was educated earlier to call when she needs the restroom. Patient has gotten out of bed without assistance and bed alarm has sounded twice. Patient reminded to call for safety and need for out of bed activities.

## 2021-02-09 NOTE — Progress Notes (Signed)
Subjective: [name] is a 25 year old female with a medical history significant for sickle cell disease, chronic pain syndrome, history of polysubstance abuse, and history of mild intermittent asthma was admitted for acute chest syndrome in the setting of sickle cell pain crisis.  Today, patient's hemoglobin has decreased to 6.7 which is below her baseline.  Patient denies any dizziness, shortness of breath, headache, nausea, vomiting, or diarrhea.  She endorses some fatigue. She continues to complain of pain primarily to low back and lower extremities.  Pain intensity is rated 5/10.  Objective:  Vital signs in last 24 hours:  Vitals:   02/09/21 1152 02/09/21 1220 02/09/21 1251 02/09/21 1445  BP: 105/60 103/63 102/62 105/70  Pulse: (!) 105 (!) 101 90 (!) 101  Resp: 16 16 16 16   Temp: 98.5 F (36.9 C) 98.5 F (36.9 C) 98.3 F (36.8 C) 98.3 F (36.8 C)  TempSrc: Oral Oral Oral Oral  SpO2: 98% 99% 94% 95%  Weight:      Height:        Intake/Output from previous day:   Intake/Output Summary (Last 24 hours) at 02/09/2021 1755 Last data filed at 02/09/2021 1445 Gross per 24 hour  Intake 593.75 ml  Output --  Net 593.75 ml    Physical Exam: General: Alert, awake, oriented x3, in no acute distress.  HEENT: [place]/AT PEERL, EOMI Neck: Trachea midline,  no masses, no thyromegal,y no JVD, no carotid bruit OROPHARYNX:  Moist, No exudate/ erythema/lesions.  Heart: Regular rate and rhythm, without murmurs, rubs, gallops, PMI non-displaced, no heaves or thrills on palpation.  Lungs: Clear to auscultation, no wheezing or rhonchi noted. No increased vocal fremitus resonant to percussion  Abdomen: Soft, nontender, nondistended, positive bowel sounds, no masses no hepatosplenomegaly noted..  Neuro: No focal neurological deficits noted cranial nerves II through XII grossly intact. DTRs 2+ bilaterally upper and lower extremities. Strength 5 out of 5 in bilateral upper and lower  extremities. Musculoskeletal: No warm swelling or erythema around joints, no spinal tenderness noted. Psychiatric: Patient alert and oriented x3, good insight and cognition, good recent to remote recall. Lymph node survey: No cervical axillary or inguinal lymphadenopathy noted.  Lab Results:  Basic Metabolic Panel:    Component Value Date/Time   NA 136 02/08/2021 0522   K 4.1 02/08/2021 0522   CL 103 02/08/2021 0522   CO2 23 02/08/2021 0522   BUN 12 02/08/2021 0522   CREATININE 0.80 02/08/2021 0522   GLUCOSE 98 02/08/2021 0522   CALCIUM 8.4 (L) 02/08/2021 0522   CBC:    Component Value Date/Time   WBC 18.7 (H) 02/09/2021 0554   HGB 8.1 (L) 02/09/2021 1542   HCT 22.9 (L) 02/09/2021 1542   PLT 666 (H) 02/09/2021 0554   MCV 96.8 02/09/2021 0554   NEUTROABS 15.3 (H) 02/07/2021 1013   LYMPHSABS 1.9 02/07/2021 1013   MONOABS 0.9 02/07/2021 1013   EOSABS 0.1 02/07/2021 1013   BASOSABS 0.1 02/07/2021 1013    Recent Results (from the past 240 hour(s))  Blood culture (routine x 2)     Status: None (Preliminary result)   Collection Time: 02/07/21 10:15 AM   Specimen: BLOOD  Result Value Ref Range Status   Specimen Description   Final    BLOOD LEFT ANTECUBITAL Performed at [facility], [place] [address] [place],  [address]    Special Requests   Final    BOTTLES DRAWN AEROBIC AND ANAEROBIC Blood Culture adequate volume Performed at [facility],  [place] [address], [place], [place] [address]    Culture   Final    NO GROWTH 2 DAYS Performed at [facility] [address], [place], [place] [address]    Report Status PENDING  Incomplete  Blood culture (routine x 2)     Status: None (Preliminary result)   Collection Time: 02/07/21 10:17 AM   Specimen: BLOOD  Result Value Ref Range Status   Specimen Description   Final    BLOOD RIGHT ANTECUBITAL Performed at [facility], [place] [address], [place], [place] [address]    Special Requests    Final    BOTTLES DRAWN AEROBIC AND ANAEROBIC Blood Culture adequate volume Performed at [facility] [address] [place], [place] [address]    Culture   Final    NO GROWTH 2 DAYS Performed at [facility] [address], [place], [place] [address]    Report Status PENDING  Incomplete  Resp Panel by RT-PCR (Flu A&B, Covid) Nasopharyngeal Swab     Status: None   Collection Time: 02/07/21 10:40 AM   Specimen: Nasopharyngeal Swab; Nasopharyngeal(NP) swabs in vial transport medium  Result Value Ref Range Status   SARS Coronavirus 2 by RT PCR NEGATIVE NEGATIVE Final    Comment: (NOTE) SARS-CoV-2 target nucleic acids are NOT DETECTED.  The SARS-CoV-2 RNA is generally detectable in upper respiratory specimens during the acute phase of infection. The lowest concentration of SARS-CoV-2 viral copies this assay can detect is 138 copies/mL. A negative result does not preclude SARS-Cov-2 infection and should not be used as the sole basis for treatment or other patient management decisions. A negative result may occur with  improper specimen collection/handling, submission of specimen other than nasopharyngeal swab, presence of viral mutation(s) within the areas targeted by this assay, and inadequate number of viral copies(<138 copies/mL). A negative result must be combined with clinical observations, patient history, and epidemiological information. The expected result is Negative.  Fact Sheet for Patients:  EntrepreneurPulse.com.au  Fact Sheet for Healthcare Providers:  IncredibleEmployment.be  This test is no t yet approved or cleared by the [place] FDA and  has been authorized for detection and/or diagnosis of SARS-CoV-2 by FDA under an Emergency Use Authorization (EUA). This EUA will remain  in effect (meaning this test can be used) for the duration of the COVID-19 declaration under Section 564(b)(1) of the Act,  21 U.S.C.section 360bbb-3(b)(1), unless the authorization is terminated  or revoked sooner.       Influenza A by PCR NEGATIVE NEGATIVE Final   Influenza B by PCR NEGATIVE NEGATIVE Final    Comment: (NOTE) The Xpert Xpress SARS-CoV-2/FLU/RSV plus assay is intended as an aid in the diagnosis of influenza from Nasopharyngeal swab specimens and should not be used as a sole basis for treatment. Nasal washings and aspirates are unacceptable for Xpert Xpress SARS-CoV-2/FLU/RSV testing.  Fact Sheet for Patients: EntrepreneurPulse.com.au  Fact Sheet for Healthcare Providers: IncredibleEmployment.be  This test is not yet approved or cleared by the [place] FDA and has been authorized for detection and/or diagnosis of SARS-CoV-2 by FDA under an Emergency Use Authorization (EUA). This EUA will remain in effect (meaning this test can be used) for the duration of the COVID-19 declaration under Section 564(b)(1) of the Act, 21 U.S.C. section 360bbb-3(b)(1), unless the authorization is terminated or revoked.  Performed at [facility], [place] [address] [place], [place] [address]   Urine culture  Status: Abnormal   Collection Time: 02/07/21  1:13 PM   Specimen: Urine, Clean Catch  Result Value Ref Range Status   Specimen Description   Final    URINE, CLEAN CATCH Performed at [facility], [place] [address] [place], [place] [address]    Special Requests   Final    NONE Performed at [facility], [place] [address] [place], [place] [address]    Culture (A)  Final    6,000 COLONIES/mL GROUP B STREP(S.AGALACTIAE)ISOLATED TESTING AGAINST S. AGALACTIAE NOT ROUTINELY PERFORMED DUE TO PREDICTABILITY OF AMP/PEN/VAN SUSCEPTIBILITY. Performed at [facility], [place] [address] [place], [place] [address]    Report Status 02/09/2021 FINAL  Final    Studies/Results: No results  found.  Medications: Scheduled Meds: . amoxicillin-clavulanate  1 tablet Oral Q12H  . enoxaparin (LOVENOX) injection  40 mg Subcutaneous Q24H  . folic acid  1 mg Oral Daily  . gabapentin  100 mg Oral TID  . senna-docusate  1 tablet Oral BID   Continuous Infusions: PRN Meds:.oxyCODONE, polyethylene glycol  Consultants:  None  Procedures:  None  Antibiotics:  None  Assessment/Plan: Principal Problem:   Sickle cell crisis acute chest syndrome (HCC) Active Problems:   Asthma, mild intermittent   Tobacco abuse   Leukocytosis   Substance abuse (HCC)   Generalized anxiety disorder   Infestation by bed bug  Sickle cell disease with pain crisis: Continue oxycodone 10 mg every 4 hours as needed Toradol 15 mg IV every 6 hours for total of 5 days Reduce IV fluids to [facility] Monitor vital signs very closely, reevaluate pain scale regularly, and supplemental oxygen as needed  Acute chest syndrome: Patient is afebrile.  She does not have an oxygen requirement.  Continue antibiotics. Continue incentive spirometer Pain management  Leukocytosis: Stable.  Continue antibiotics.  Blood cultures negative.  Urinalysis unremarkable.  Continue to follow closely.  Labs in AM.  Sickle cell anemia: Hemoglobin 6.7, which is below patient's baseline.  Transfuse 1 unit PRBCs.  CBC in a.m.  History of polysubstance abuse: Urine drug screen positive for opiates and cocaine.  Patient counseled at length. Social work consulted  Code Status: Full Code Family Communication: N/A Disposition Plan: Not yet ready for discharge  [place], MSN, FNP-C Patient [place] [address] [place], [place] [address] [phone]  If 5PM-8AM, please contact night-coverage.  02/09/2021, 5:55 PM  LOS: 2 days

## 2021-02-09 NOTE — Progress Notes (Addendum)
[name] left AMA at about 2045. The charge nurse [name] and I both tried to talk her into staying until tomorrow when she would most likely be discharge but she was insistent upon leaving. She signed the [facility] paper, her PIV was removed, the covering hospitalist [name] was notified and the [place] came up and talked to the charge nurse and I about the situation. [name] said she had no belongings with her, I have not looked through the room yet, she left just in her clothes and a blanket from home over her shoulders.

## 2021-02-09 NOTE — Progress Notes (Signed)
Patient is wanting to leave AMA at this time. Encouraged patient to stay overnight for her safety. Informed her that the plan is to discharge tomorrow. Patient is still wanting to leave. Called and messaged AC, no reply as of yet. AMA form given to nurse under her care. Informed nurse to let physician know about the situation.

## 2021-02-10 LAB — TYPE AND SCREEN
ABO/RH(D): B POS
Antibody Screen: NEGATIVE
Unit division: 0
Unit division: 0

## 2021-02-10 LAB — BPAM RBC
Blood Product Expiration Date: 202204282359
Blood Product Expiration Date: 202205062359
ISSUE DATE / TIME: 202204131220
ISSUE DATE / TIME: 202204131704
Unit Type and Rh: 5100
Unit Type and Rh: 7300

## 2021-02-10 NOTE — Discharge Summary (Signed)
Physician Discharge Summary  [name] IRW:[id number] DOB: 07-02-1996 DOA: 02/07/2021  PCP: Dorena Dew, FNP  Admit date: 02/07/2021  Discharge date: 02/10/2021  Discharge Diagnoses:  Principal Problem:   Sickle cell crisis acute chest syndrome (HCC) Active Problems:   Asthma, mild intermittent   Tobacco abuse   Leukocytosis   Substance abuse ([place])   Generalized anxiety disorder   Infestation by bed bug   Discharge Condition: Left AGAINST MEDICAL ADVICE  Disposition:  Patient hemodynamically stable prior to leaving [place]. Diet: Regular Wt Readings from Last 3 Encounters:  02/07/21 61 kg  01/15/20 50.8 kg  12/22/19 51 kg    History of present illness:    Hospital Course:  Sickle cell disease with pain crisis: Patient was admitted for sickle cell pain crisis and managed appropriately with IVF, IV Dilaudid via PCA and IV Toradol, as well as other adjunct therapies per sickle cell pain management protocols.  Patient was unable to maintain safety devices and was transition to all oral medications. Acute chest syndrome: Patient completed 2 days of IV antibiotics and was transitioned to Augmentin 875-225 mg every 12 hours. Patient did not have an oxygen requirement prior to leaving [place].  History of polysubstance abuse: Patient is urine drug screen was positive for both opiates and cocaine.  Patient was counseled at length on prior assessment.  Patient did not express clear understanding.  Upon chart review and previous assessment, it appears that patient was hemodynamically stable prior to leaving [place]. Discharge Exam: Vitals:   02/09/21 1445 02/09/21 1840  BP: 105/70 105/64  Pulse: (!) 101 (!) 103  Resp: 16 18  Temp: 98.3 F (36.8 C) 98 F (36.7 C)  SpO2: 95% 94%   Vitals:   02/09/21 1220 02/09/21 1251 02/09/21 1445 02/09/21 1840  BP: 103/63 102/62 105/70 105/64  Pulse: (!) 101 90 (!) 101 (!)  103  Resp: 16 16 16 18   Temp: 98.5 F (36.9 C) 98.3 F (36.8 C) 98.3 F (36.8 C) 98 F (36.7 C)  TempSrc: Oral Oral Oral Oral  SpO2: 99% 94% 95% 94%  Weight:      Height:         Discharge Instructions   Allergies as of 02/09/2021   No Known Allergies     Medication List    ASK your doctor about these medications   folic acid 1 MG tablet Commonly known as: FOLVITE Take 1 tablet (1 mg total) by mouth daily.   gabapentin 100 MG capsule Commonly known as: NEURONTIN Take 1 capsule (100 mg total) by mouth 3 (three) times daily.   HYDROcodone-acetaminophen 5-325 MG tablet Commonly known as: NORCO/VICODIN Take 1 tablet by mouth every 6 (six) hours as needed for moderate pain.   ibuprofen 800 MG tablet Commonly known as: ADVIL Take 1 tablet (800 mg total) by mouth every 8 (eight) hours as needed.   ondansetron 4 MG tablet Commonly known as: ZOFRAN Take 1 tablet (4 mg total) by mouth every 8 (eight) hours as needed for nausea.       The results of significant diagnostics from this hospitalization (including imaging, microbiology, ancillary and laboratory) are listed below for reference.    Significant Diagnostic Studies: DG Chest Port 1 View  Result Date: 02/07/2021 CLINICAL DATA:  Cough and fever.  Sickle cell disease EXAM: PORTABLE CHEST 1 VIEW COMPARISON:  July 10, 2019 FINDINGS: There is airspace opacity in the left lower lobe. There is bibasilar atelectasis. There  is mild cardiomegaly with pulmonary vascularity normal. No adenopathy appreciable. There is sclerosis noted in the right humeral head consistent with known sickle cell disease. IMPRESSION: Airspace opacity most likely representing pneumonia left base region. There is bibasilar atelectasis as well. Stable cardiac prominence. Pulmonary vascularity normal. No adenopathy. Sclerosis in right humeral head consistent with sickle cell disease. Electronically Signed   By: Lowella Grip III M.D.   On:  02/07/2021 10:20    Microbiology: Recent Results (from the past 240 hour(s))  Blood culture (routine x 2)     Status: None (Preliminary result)   Collection Time: 02/07/21 10:15 AM   Specimen: BLOOD  Result Value Ref Range Status   Specimen Description   Final    BLOOD LEFT ANTECUBITAL Performed at [facility], [place] [address] [place], [place] [address]    Special Requests   Final    BOTTLES DRAWN AEROBIC AND ANAEROBIC Blood Culture adequate volume Performed at [facility] [address] [place], [place] [address]    Culture   Final    NO GROWTH 3 DAYS Performed at [facility], [place] [address], [place], [place] [address]    Report Status PENDING  Incomplete  Blood culture (routine x 2)     Status: None (Preliminary result)   Collection Time: 02/07/21 10:17 AM   Specimen: BLOOD  Result Value Ref Range Status   Specimen Description   Final    BLOOD RIGHT ANTECUBITAL Performed at [facility], [place] [address] [place], [place] [address]    Special Requests   Final    BOTTLES DRAWN AEROBIC AND ANAEROBIC Blood Culture adequate volume Performed at [place] [address], [place], [place] [address]    Culture   Final    NO GROWTH 3 DAYS Performed at [facility], [place] [address], [place], [place] [address]    Report Status PENDING  Incomplete  Resp Panel by RT-PCR (Flu A&B, Covid) Nasopharyngeal Swab     Status: None   Collection Time: 02/07/21 10:40 AM   Specimen: Nasopharyngeal Swab; Nasopharyngeal(NP) swabs in vial transport medium  Result Value Ref Range Status   SARS Coronavirus 2 by RT PCR NEGATIVE NEGATIVE Final    Comment: (NOTE) SARS-CoV-2 target nucleic acids are NOT DETECTED.  The SARS-CoV-2 RNA is generally detectable in upper respiratory specimens during the acute phase of infection. The lowest concentration of SARS-CoV-2 viral copies this assay can detect is 138 copies/mL. A negative result  does not preclude SARS-Cov-2 infection and should not be used as the sole basis for treatment or other patient management decisions. A negative result may occur with  improper specimen collection/handling, submission of specimen other than nasopharyngeal swab, presence of viral mutation(s) within the areas targeted by this assay, and inadequate number of viral copies(<138 copies/mL). A negative result must be combined with clinical observations, patient history, and epidemiological information. The expected result is Negative.  Fact Sheet for Patients:  EntrepreneurPulse.com.au  Fact Sheet for Healthcare Providers:  IncredibleEmployment.be  This test is no t yet approved or cleared by the [place] FDA and  has been authorized for detection and/or diagnosis of SARS-CoV-2 by FDA under an Emergency Use Authorization (EUA). This EUA will remain  in effect (meaning this test can be used) for the duration of the COVID-19 declaration under Section 564(b)(1) of the Act, 21 U.S.C.section 360bbb-3(b)(1), unless the authorization is terminated  or revoked sooner.  Influenza A by PCR NEGATIVE NEGATIVE Final   Influenza B by PCR NEGATIVE NEGATIVE Final    Comment: (NOTE) The Xpert Xpress SARS-CoV-2/FLU/RSV plus assay is intended as an aid in the diagnosis of influenza from Nasopharyngeal swab specimens and should not be used as a sole basis for treatment. Nasal washings and aspirates are unacceptable for Xpert Xpress SARS-CoV-2/FLU/RSV testing.  Fact Sheet for Patients: EntrepreneurPulse.com.au  Fact Sheet for Healthcare Providers: IncredibleEmployment.be  This test is not yet approved or cleared by the [place] FDA and has been authorized for detection and/or diagnosis of SARS-CoV-2 by FDA under an Emergency Use Authorization (EUA). This EUA will remain in effect (meaning this test can be used) for  the duration of the COVID-19 declaration under Section 564(b)(1) of the Act, 21 U.S.C. section 360bbb-3(b)(1), unless the authorization is terminated or revoked.  Performed at [facility], [place] [address], [place], [place] [address]   Urine culture     Status: Abnormal   Collection Time: 02/07/21  1:13 PM   Specimen: Urine, Clean Catch  Result Value Ref Range Status   Specimen Description   Final    URINE, CLEAN CATCH Performed at [facility], [place] [address], [place], [place] [address]    Special Requests   Final    NONE Performed at [facility], [place] [address], [place], [place] [address]    Culture (A)  Final    6,000 COLONIES/mL GROUP B STREP(S.AGALACTIAE)ISOLATED TESTING AGAINST S. AGALACTIAE NOT ROUTINELY PERFORMED DUE TO PREDICTABILITY OF AMP/PEN/VAN SUSCEPTIBILITY. Performed at [facility], [place] [address], [place], [place] [address]    Report Status 02/09/2021 FINAL  Final     Labs: Basic Metabolic Panel: Recent Labs  Lab 02/07/21 1013 02/08/21 0522  NA 137 136  K 4.1 4.1  CL 106 103  CO2 24 23  GLUCOSE 124* 98  BUN 8 12  CREATININE 0.96 0.80  CALCIUM 9.2 8.4*   Liver Function Tests: Recent Labs  Lab 02/07/21 1013  AST 16  ALT 24  ALKPHOS 79  BILITOT 2.2*  PROT 8.3*  ALBUMIN 4.1   Recent Labs  Lab 02/07/21 1013  LIPASE 26   No results for input(s): AMMONIA in the last 168 hours. CBC: Recent Labs  Lab 02/07/21 1013 02/08/21 0522 02/09/21 0554 02/09/21 1542  WBC 18.4* 17.4* 18.7*  --   NEUTROABS 15.3*  --   --   --   HGB 8.8* 7.5* 6.7* 8.1*  HCT 24.4* 21.0* 18.4* 22.9*  MCV 99.6 98.6 96.8  --   PLT 640* 401* 666*  --    Cardiac Enzymes: No results for input(s): CKTOTAL, CKMB, CKMBINDEX, TROPONINI in the last 168 hours. BNP: Invalid input(s): POCBNP CBG: No results for input(s): GLUCAP in the last 168 hours.  Time coordinating discharge: 25  minutes  Signed:  Donia Pounds  APRN, MSN, FNP-C Patient [facility] [address] [place], [place] [address] [phone] [facility] 02/10/2021, 8:36 AM

## 2021-02-12 LAB — CULTURE, BLOOD (ROUTINE X 2)
Culture: NO GROWTH
Culture: NO GROWTH
Special Requests: ADEQUATE
Special Requests: ADEQUATE

## 2021-08-07 ENCOUNTER — Emergency Department (HOSPITAL_COMMUNITY)
Admission: EM | Admit: 2021-08-07 | Discharge: 2021-08-07 | Discharge status: Against Medical Advice | Payer: Medicaid Other

## 2021-08-07 NOTE — ED Notes (Signed)
Patient told this tech she had to use the bathroom, patient then left triage area to use the bathroom. Patient no longer in the bathroom and not in lobby area after 10 minutes. Called patient 3x, no response.

## 2021-11-18 ENCOUNTER — Encounter (HOSPITAL_COMMUNITY): Payer: Self-pay

## 2021-11-18 ENCOUNTER — Emergency Department (HOSPITAL_COMMUNITY)
Admission: EM | Admit: 2021-11-18 | Discharge: 2021-11-18 | Disposition: A | Discharge status: Home / Self Care | Payer: Medicaid Other | Attending: Emergency Medicine | Admitting: Emergency Medicine

## 2021-11-18 ENCOUNTER — Other Ambulatory Visit: Payer: Self-pay

## 2021-11-18 DIAGNOSIS — D57219 Sickle-cell/Hb-C disease with crisis, unspecified: Secondary | ICD-10-CM | POA: Insufficient documentation

## 2021-11-18 DIAGNOSIS — D5700 Hb-SS disease with crisis, unspecified: Secondary | ICD-10-CM

## 2021-11-18 LAB — CBC WITH DIFFERENTIAL/PLATELET
Abs Immature Granulocytes: 0.24 10*3/uL — ABNORMAL HIGH (ref 0.00–0.07)
Basophils Absolute: 0.1 10*3/uL (ref 0.0–0.1)
Basophils Relative: 0 %
Eosinophils Absolute: 0 10*3/uL (ref 0.0–0.5)
Eosinophils Relative: 0 %
HCT: 23.2 % — ABNORMAL LOW (ref 36.0–46.0)
Hemoglobin: 8.1 g/dL — ABNORMAL LOW (ref 12.0–15.0)
Immature Granulocytes: 1 %
Lymphocytes Relative: 63 %
Lymphs Abs: 10.5 10*3/uL — ABNORMAL HIGH (ref 0.7–4.0)
MCH: 34.8 pg — ABNORMAL HIGH (ref 26.0–34.0)
MCHC: 34.9 g/dL (ref 30.0–36.0)
MCV: 99.6 fL (ref 80.0–100.0)
Monocytes Absolute: 1.4 10*3/uL — ABNORMAL HIGH (ref 0.1–1.0)
Monocytes Relative: 8 %
Neutro Abs: 4.6 10*3/uL (ref 1.7–7.7)
Neutrophils Relative %: 28 %
Platelets: 441 10*3/uL — ABNORMAL HIGH (ref 150–400)
RBC: 2.33 MIL/uL — ABNORMAL LOW (ref 3.87–5.11)
RDW: 24.8 % — ABNORMAL HIGH (ref 11.5–15.5)
WBC: 16.8 10*3/uL — ABNORMAL HIGH (ref 4.0–10.5)
nRBC: 9.5 % — ABNORMAL HIGH (ref 0.0–0.2)

## 2021-11-18 LAB — COMPREHENSIVE METABOLIC PANEL
ALT: 30 U/L (ref 0–44)
AST: 31 U/L (ref 15–41)
Albumin: 3.7 g/dL (ref 3.5–5.0)
Alkaline Phosphatase: 57 U/L (ref 38–126)
Anion gap: 8 (ref 5–15)
BUN: 8 mg/dL (ref 6–20)
CO2: 26 mmol/L (ref 22–32)
Calcium: 8.6 mg/dL — ABNORMAL LOW (ref 8.9–10.3)
Chloride: 106 mmol/L (ref 98–111)
Creatinine, Ser: 0.8 mg/dL (ref 0.44–1.00)
GFR, Estimated: >60 mL/min (ref >60)
Glucose, Bld: 111 mg/dL — ABNORMAL HIGH (ref 70–99)
Potassium: 4.4 mmol/L (ref 3.5–5.1)
Sodium: 140 mmol/L (ref 135–145)
Total Bilirubin: 2.1 mg/dL — ABNORMAL HIGH (ref 0.3–1.2)
Total Protein: 7.1 g/dL (ref 6.5–8.1)

## 2021-11-18 LAB — RETICULOCYTES
Immature Retic Fract: 45.9 % — ABNORMAL HIGH (ref 2.3–15.9)
RBC.: 2.3 MIL/uL — ABNORMAL LOW (ref 3.87–5.11)
Retic Count, Absolute: 396 10*3/uL — ABNORMAL HIGH (ref 19.0–186.0)
Retic Ct Pct: 17.2 % — ABNORMAL HIGH (ref 0.4–3.1)

## 2021-11-18 LAB — I-STAT BETA HCG BLOOD, ED (MC, WL, AP ONLY): I-stat hCG, quantitative: <5 m[IU]/mL (ref <5)

## 2021-11-18 MED ORDER — HYDROMORPHONE HCL 1 MG/ML IJ SOLN
0.5000 mg | INTRAMUSCULAR | Status: AC
  Administered 2021-11-18: 0.5 mg via INTRAVENOUS
  Filled 2021-11-18: qty 1

## 2021-11-18 MED ORDER — ONDANSETRON HCL 4 MG/2ML IJ SOLN
4.0000 mg | INTRAMUSCULAR | Status: DC | PRN
  Administered 2021-11-18: 4 mg via INTRAVENOUS
  Filled 2021-11-18: qty 2

## 2021-11-18 MED ORDER — ONDANSETRON HCL 4 MG PO TABS: 4.0000 mg | ORAL_TABLET | Freq: Four times a day (QID) | ORAL | 0 refills | Status: AC

## 2021-11-18 MED ORDER — DIPHENHYDRAMINE HCL 50 MG/ML IJ SOLN
25.0000 mg | Freq: Once | INTRAMUSCULAR | Status: AC
  Administered 2021-11-18: 25 mg via INTRAVENOUS
  Filled 2021-11-18: qty 1

## 2021-11-18 MED ORDER — HYDROMORPHONE HCL 1 MG/ML IJ SOLN
1.0000 mg | INTRAMUSCULAR | Status: AC
  Administered 2021-11-18: 1 mg via INTRAVENOUS
  Filled 2021-11-18: qty 1

## 2021-11-18 MED ORDER — HYDROMORPHONE HCL 2 MG/ML IJ SOLN: 2.0000 mg | INTRAMUSCULAR | Status: DC

## 2021-11-18 MED ORDER — KETOROLAC TROMETHAMINE 30 MG/ML IJ SOLN
30.0000 mg | Freq: Once | INTRAMUSCULAR | Status: AC
  Administered 2021-11-18: 30 mg via INTRAVENOUS
  Filled 2021-11-18: qty 1

## 2021-11-18 NOTE — ED Provider Notes (Signed)
Accepted handoff at shift change from [facility]. Please see prior provider note for more detail.   Briefly: Patient is 26 y.o.   DDX: concern for sickle cell pain crisis, with history of anxiety, acute chest.  Has been having worsening pain over the last week.  At time of handoff work-up is complete, her white count, reticulocytes, chest x-ray will compare to recent, no signs of acute chest.  Plan: Reassess after pain medication, make sure that patient can pass p.o. challenge, is able to ambulate.  On my assessment patient has been drinking water without additional nausea, is able to ambulate.  Patient appears stable for discharge at this time.  I will prescribe her some Zofran to go home on.  Encouraged follow-up with [place] [facility].  Discharged in stable condition at this time.     Anselmo Pickler, PA-C 11/18/21 1027    Lajean Saver, MD 11/18/21 [phone]

## 2021-11-18 NOTE — Discharge Instructions (Addendum)
Please follow-up with your regular doctor.  Please return to the emergency department with any new or worsening symptoms.

## 2021-11-18 NOTE — ED Triage Notes (Signed)
Pt arrives EMS from home with SCC pain worst in all extremities. Pain worsening for 1 week.

## 2021-11-18 NOTE — ED Provider Notes (Signed)
[facility] DEPT Provider Note   CSN: [id number] Arrival date & time: 11/18/21  0241     History  Chief Complaint  Patient presents with   Sickle Cell Pain Crisis    [name] is a 26 y.o. female.  HPI Patient is a 26 year old female with a history of sickle cell anemia, GAD, acute chest syndrome, who presents to the emergency department due to pain in the right arm and left shin.  She states her symptoms started about 1 week ago and began worsening yesterday.  She states she is been taking ibuprofen without significant relief.  Denies any chest pain, shortness of breath, cough, rhinorrhea, sore throat, abdominal pain, nausea, vomiting, diarrhea.    Home Medications Prior to Admission medications   Medication Sig Start Date End Date Taking? Authorizing Provider  ibuprofen (ADVIL) 200 MG tablet Take 200 mg by mouth every 8 (eight) hours as needed for moderate pain.   Yes [provider]      Allergies    Patient has no known allergies.    Review of Systems   Review of Systems  All other systems reviewed and are negative. Ten systems reviewed and are negative for acute change, except as noted in the HPI.   Physical Exam Updated Vital Signs BP 122/85    Pulse (!) 57    Temp 98.2 F (36.8 C) (Oral)    Resp 19    Ht 5\' 4"  (1.626 m)    Wt 63.5 kg    SpO2 96%    BMI 24.03 kg/m  Physical Exam Vitals and nursing note reviewed.  Constitutional:      General: She is not in acute distress.    Appearance: Normal appearance. She is not ill-appearing, toxic-appearing or diaphoretic.  HENT:     Head: Normocephalic and atraumatic.     Right Ear: External ear normal.     Left Ear: External ear normal.     Nose: Nose normal.     Mouth/Throat:     Mouth: Mucous membranes are moist.     Pharynx: Oropharynx is clear. No oropharyngeal exudate or posterior oropharyngeal erythema.  Eyes:     Extraocular Movements: Extraocular movements  intact.  Cardiovascular:     Rate and Rhythm: Normal rate and regular rhythm.     Pulses: Normal pulses.     Heart sounds: Normal heart sounds. No murmur heard.   No friction rub. No gallop.  Pulmonary:     Effort: Pulmonary effort is normal. No respiratory distress.     Breath sounds: Normal breath sounds. No stridor. No wheezing, rhonchi or rales.  Abdominal:     General: Abdomen is flat.     Palpations: Abdomen is soft.     Tenderness: There is no abdominal tenderness.  Musculoskeletal:        General: Normal range of motion.     Cervical back: Normal range of motion and neck supple. No tenderness.     Right lower leg: No edema.     Left lower leg: No edema.     Comments: No pedal edema.  Palpable pedal pulses.  Skin:    General: Skin is warm and dry.  Neurological:     General: No focal deficit present.     Mental Status: She is alert and oriented to person, place, and time.  Psychiatric:        Mood and Affect: Mood normal.        Behavior: Behavior  normal.   ED Results / Procedures / Treatments   Labs (all labs ordered are listed, but only abnormal results are displayed) Labs Reviewed  COMPREHENSIVE METABOLIC PANEL - Abnormal; Notable for the following components:      Result Value   Glucose, Bld 111 (*)    Calcium 8.6 (*)    Total Bilirubin 2.1 (*)    All other components within normal limits  CBC WITH DIFFERENTIAL/PLATELET - Abnormal; Notable for the following components:   WBC 16.8 (*)    RBC 2.33 (*)    Hemoglobin 8.1 (*)    HCT 23.2 (*)    MCH 34.8 (*)    RDW 24.8 (*)    Platelets 441 (*)    nRBC 9.5 (*)    Lymphs Abs 10.5 (*)    Monocytes Absolute 1.4 (*)    Abs Immature Granulocytes 0.24 (*)    All other components within normal limits  RETICULOCYTES - Abnormal; Notable for the following components:   Retic Ct Pct 17.2 (*)    RBC. 2.30 (*)    Retic Count, Absolute 396.0 (*)    Immature Retic Fract 45.9 (*)    All other components within normal  limits  I-STAT BETA HCG BLOOD, ED ([facility], [facility], [facility] ONLY)    EKG None  Radiology No results found.  Procedures Procedures    Medications Ordered in ED Medications  ondansetron (ZOFRAN) injection 4 mg (4 mg Intravenous Given 11/18/21 0456)  ketorolac (TORADOL) 30 MG/ML injection 30 mg (has no administration in time range)  diphenhydrAMINE (BENADRYL) injection 25 mg (25 mg Intravenous Given 11/18/21 0456)  HYDROmorphone (DILAUDID) injection 0.5 mg (0.5 mg Intravenous Given 11/18/21 0456)  HYDROmorphone (DILAUDID) injection 1 mg (1 mg Intravenous Given 11/18/21 0551)    ED Course/ Medical Decision Making/ A&P                           Medical Decision Making Amount and/or Complexity of Data Reviewed Labs: ordered.  Risk Prescription drug management.  Pt is a 26 y.o. female with a history of sickle cell anemia who presents to the emergency department due to right arm pain as well as left shin pain.  Symptoms started about 1 week ago began worsening yesterday.  States her symptoms are consistent with prior sickle cell pain exacerbations.  Denies any chest pain, shortness of breath, abdominal pain, nausea, vomiting, diarrhea.  Denies any URI symptoms.  Labs: CBC with a white count of 16.8, hemoglobin of 8.1, MCH of 34.8, RDW 24.8, platelets of 441, and RBCs of 9.5, lymphocytes of 10.5, monocytes 1.4, absolute immature granulocytes of 0.24. CMP with a glucose of 111, calcium of 8.6, total bilirubin of 2.1. Reticulocyte count percentage of 17.2, absolute reticulocyte count of 396, immature reticulocytes of 45.9. I-STAT beta-hCG less than 5.  I, Rayna Sexton, PA-C, personally reviewed and evaluated these images and lab results as part of my medical decision-making.  Lab work appears to be consistent with patient's baseline.  She is afebrile and not tachycardic.  Heart is regular rate and rhythm.  Lungs are clear to auscultation bilaterally.  Abdomen is soft and nontender.  It is the end  of my shift and patient care is being transferred to [facility].  Patient pending reassessment after being given IV pain medications.  If patient's pain is well controlled she can be discharged home.  Note: Portions of this report may have been transcribed using voice recognition software.  Every effort was made to ensure accuracy; however, inadvertent computerized transcription errors may be present.   Final Clinical Impression(s) / ED Diagnoses Final diagnoses:  Sickle cell pain crisis [facility])   Rx / DC Orders ED Discharge Orders     None         Rayna Sexton, PA-C 11/18/21 9758    Palumbo, April, MD 11/18/21 [phone]

## 2022-03-10 ENCOUNTER — Emergency Department (HOSPITAL_COMMUNITY): Payer: Medicaid Other

## 2022-03-10 ENCOUNTER — Encounter (HOSPITAL_COMMUNITY): Payer: Self-pay | Admitting: Emergency Medicine

## 2022-03-10 ENCOUNTER — Observation Stay (HOSPITAL_COMMUNITY)
Admission: EM | Admit: 2022-03-10 | Discharge: 2022-03-11 | Discharge status: Against Medical Advice | Payer: Medicaid Other | Attending: Internal Medicine | Admitting: Internal Medicine

## 2022-03-10 DIAGNOSIS — F17210 Nicotine dependence, cigarettes, uncomplicated: Secondary | ICD-10-CM | POA: Diagnosis not present

## 2022-03-10 DIAGNOSIS — N179 Acute kidney failure, unspecified: Secondary | ICD-10-CM | POA: Diagnosis not present

## 2022-03-10 DIAGNOSIS — J45909 Unspecified asthma, uncomplicated: Secondary | ICD-10-CM | POA: Diagnosis not present

## 2022-03-10 DIAGNOSIS — D72829 Elevated white blood cell count, unspecified: Secondary | ICD-10-CM | POA: Diagnosis present

## 2022-03-10 DIAGNOSIS — D5700 Hb-SS disease with crisis, unspecified: Secondary | ICD-10-CM | POA: Diagnosis present

## 2022-03-10 LAB — CBC WITH DIFFERENTIAL/PLATELET
Abs Immature Granulocytes: 0 10*3/uL (ref 0.00–0.07)
Basophils Absolute: 0 10*3/uL (ref 0.0–0.1)
Basophils Relative: 0 %
Eosinophils Absolute: 0 10*3/uL (ref 0.0–0.5)
Eosinophils Relative: 0 %
HCT: 21.9 % — ABNORMAL LOW (ref 36.0–46.0)
Hemoglobin: 7.9 g/dL — ABNORMAL LOW (ref 12.0–15.0)
Lymphocytes Relative: 32 %
Lymphs Abs: 7.7 10*3/uL — ABNORMAL HIGH (ref 0.7–4.0)
MCH: 35.1 pg — ABNORMAL HIGH (ref 26.0–34.0)
MCHC: 36.1 g/dL — ABNORMAL HIGH (ref 30.0–36.0)
MCV: 97.3 fL (ref 80.0–100.0)
Monocytes Absolute: 0.7 10*3/uL (ref 0.1–1.0)
Monocytes Relative: 3 %
Neutro Abs: 15.7 10*3/uL — ABNORMAL HIGH (ref 1.7–7.7)
Neutrophils Relative %: 65 %
Platelets: 536 10*3/uL — ABNORMAL HIGH (ref 150–400)
RBC: 2.25 MIL/uL — ABNORMAL LOW (ref 3.87–5.11)
RDW: 23.2 % — ABNORMAL HIGH (ref 11.5–15.5)
Smear Review: ADEQUATE
WBC: 24.2 10*3/uL — ABNORMAL HIGH (ref 4.0–10.5)
nRBC: 22 /100 WBC — ABNORMAL HIGH

## 2022-03-10 LAB — URINALYSIS, ROUTINE W REFLEX MICROSCOPIC
Bilirubin Urine: NEGATIVE
Glucose, UA: NEGATIVE mg/dL
Hgb urine dipstick: NEGATIVE
Ketones, ur: NEGATIVE mg/dL
Leukocytes,Ua: NEGATIVE
Nitrite: NEGATIVE
Protein, ur: NEGATIVE mg/dL
Specific Gravity, Urine: 1.01 (ref 1.005–1.030)
pH: 5 (ref 5.0–8.0)

## 2022-03-10 LAB — COMPREHENSIVE METABOLIC PANEL
ALT: 15 U/L (ref 0–44)
AST: 19 U/L (ref 15–41)
Albumin: 3.7 g/dL (ref 3.5–5.0)
Alkaline Phosphatase: 84 U/L (ref 38–126)
Anion gap: 7 (ref 5–15)
BUN: 7 mg/dL (ref 6–20)
CO2: 24 mmol/L (ref 22–32)
Calcium: 8.9 mg/dL (ref 8.9–10.3)
Chloride: 107 mmol/L (ref 98–111)
Creatinine, Ser: 0.89 mg/dL (ref 0.44–1.00)
GFR, Estimated: >60 mL/min (ref >60)
Glucose, Bld: 113 mg/dL — ABNORMAL HIGH (ref 70–99)
Potassium: 3.9 mmol/L (ref 3.5–5.1)
Sodium: 138 mmol/L (ref 135–145)
Total Bilirubin: 3.5 mg/dL — ABNORMAL HIGH (ref 0.3–1.2)
Total Protein: 6.8 g/dL (ref 6.5–8.1)

## 2022-03-10 LAB — RETICULOCYTES
Immature Retic Fract: 32.1 % — ABNORMAL HIGH (ref 2.3–15.9)
RBC.: 2.24 MIL/uL — ABNORMAL LOW (ref 3.87–5.11)
Retic Count, Absolute: 379.5 10*3/uL — ABNORMAL HIGH (ref 19.0–186.0)
Retic Ct Pct: 16.9 % — ABNORMAL HIGH (ref 0.4–3.1)

## 2022-03-10 LAB — I-STAT BETA HCG BLOOD, ED (MC, WL, AP ONLY): I-stat hCG, quantitative: <5 m[IU]/mL (ref <5)

## 2022-03-10 LAB — LIPASE, BLOOD: Lipase: 28 U/L (ref 11–51)

## 2022-03-10 MED ORDER — ONDANSETRON HCL 4 MG/2ML IJ SOLN
4.0000 mg | INTRAMUSCULAR | Status: DC | PRN
  Administered 2022-03-10: 4 mg via INTRAVENOUS
  Filled 2022-03-10: qty 2

## 2022-03-10 MED ORDER — HYDROMORPHONE HCL 1 MG/ML IJ SOLN
2.0000 mg | INTRAMUSCULAR | Status: AC
  Administered 2022-03-10: 2 mg via INTRAVENOUS
  Filled 2022-03-10: qty 2

## 2022-03-10 MED ORDER — KETOROLAC TROMETHAMINE 15 MG/ML IJ SOLN
15.0000 mg | Freq: Once | INTRAMUSCULAR | Status: AC
  Administered 2022-03-10: 15 mg via INTRAVENOUS
  Filled 2022-03-10: qty 1

## 2022-03-10 MED ORDER — DEXTROSE-NACL 5-0.45 % IV SOLN: INTRAVENOUS | Status: DC

## 2022-03-10 MED ORDER — FENTANYL CITRATE PF 50 MCG/ML IJ SOSY
50.0000 ug | PREFILLED_SYRINGE | Freq: Once | INTRAMUSCULAR | Status: AC
  Administered 2022-03-10: 50 ug via INTRAVENOUS
  Filled 2022-03-10: qty 1

## 2022-03-10 MED ORDER — LACTATED RINGERS IV BOLUS
1000.0000 mL | Freq: Once | INTRAVENOUS | Status: AC
Start: 2022-03-10 — End: 2022-03-10
  Administered 2022-03-10: 1000 mL via INTRAVENOUS

## 2022-03-10 MED ORDER — MORPHINE SULFATE 30 MG PO TABS
30.0000 mg | ORAL_TABLET | Freq: Once | ORAL | Status: DC
  Filled 2022-03-10: qty 1

## 2022-03-10 MED ORDER — HYDROMORPHONE HCL 1 MG/ML IJ SOLN: 2.0000 mg | INTRAMUSCULAR | Status: DC

## 2022-03-10 MED ORDER — HYDROMORPHONE HCL 1 MG/ML IJ SOLN
1.0000 mg | INTRAMUSCULAR | Status: DC | PRN
  Administered 2022-03-10 (×2): 1 mg via INTRAVENOUS
  Filled 2022-03-10 (×2): qty 1

## 2022-03-10 MED ORDER — ONDANSETRON HCL 4 MG PO TABS: 4.0000 mg | ORAL_TABLET | ORAL | Status: DC | PRN

## 2022-03-10 MED ORDER — KETOROLAC TROMETHAMINE 15 MG/ML IJ SOLN
15.0000 mg | Freq: Four times a day (QID) | INTRAMUSCULAR | Status: DC
  Administered 2022-03-10: 15 mg via INTRAVENOUS
  Filled 2022-03-10: qty 1

## 2022-03-10 MED ORDER — SODIUM CHLORIDE 0.9 % IV SOLN
12.5000 mg | Freq: Once | INTRAVENOUS | Status: AC
  Administered 2022-03-10: 12.5 mg via INTRAVENOUS
  Filled 2022-03-10: qty 0.25

## 2022-03-10 MED ORDER — POLYETHYLENE GLYCOL 3350 17 G PO PACK: 17.0000 g | PACK | Freq: Every day | ORAL | Status: DC | PRN

## 2022-03-10 MED ORDER — SENNOSIDES-DOCUSATE SODIUM 8.6-50 MG PO TABS
1.0000 | ORAL_TABLET | Freq: Two times a day (BID) | ORAL | Status: DC
  Administered 2022-03-10: 1 via ORAL
  Filled 2022-03-10: qty 1

## 2022-03-10 MED ORDER — DIPHENHYDRAMINE HCL 25 MG PO CAPS: 25.0000 mg | ORAL_CAPSULE | ORAL | Status: DC | PRN

## 2022-03-10 MED ORDER — ENOXAPARIN SODIUM 40 MG/0.4ML IJ SOSY
40.0000 mg | PREFILLED_SYRINGE | INTRAMUSCULAR | Status: DC
  Administered 2022-03-10: 40 mg via SUBCUTANEOUS
  Filled 2022-03-10: qty 0.4

## 2022-03-10 MED ORDER — ONDANSETRON HCL 4 MG/2ML IJ SOLN: 4.0000 mg | Freq: Four times a day (QID) | INTRAMUSCULAR | Status: DC | PRN

## 2022-03-10 NOTE — ED Notes (Signed)
Pt came out of room stating that she wanted help. When asked what she needed she said pain meds. RN tried to explain to pt that without her IV he can't give her anything. Pt stated that we were being rude and wanted a different nurse. Pt walking down hallway when trying to assist pt back to room pt stated that she didn't want [place] to help her and went to find someone else. Pt continues to cry and scream. Pt finally redirected back into room but voiced that she does not want the RN or NT in yellow to help her.  ?

## 2022-03-10 NOTE — Hospital Course (Signed)
[name] is a 26 y.o. female with medical history significant for sickle cell anemia, mild intermittent asthma, history of substance use who is admitted for sickle cell pain crisis. ?

## 2022-03-10 NOTE — H&P (Signed)
?History and Physical  ? ? ?[name]:[id number] DOB: Oct 01, 1996 DOA: 03/10/2022 ? ?PCP: Dorena Dew, FNP  ?Patient coming from: Home ? ?I have personally briefly reviewed patient's old medical records in [place] ? ?Chief Complaint: Sickle cell pain ? ?HPI: ?[name] is a 26 y.o. female with medical history significant for sickle cell anemia, mild intermittent asthma, history of substance use who presented to the ED for evaluation of sickle cell pain. ? ?Patient reports 2 days of recurrent sickle cell crisis type pain involving her back, abdomen, and knees.  She says this is similar to her prior sickle cell crisis episodes.  She states that she does not have any pain medications at home.  She reports continued uncontrolled pain.  Denies chest pain, dyspnea, subjective fevers, chills, diaphoresis. ? ?ED Course  Labs/Imaging on admission: I have personally reviewed following labs and imaging studies. ? ?Initial vitals showed BP 129/101, pulse 81, RR 24, temp 98.0 ?F, SPO2 98% on room air. ? ?Labs show WBC 24.2, hemoglobin 7.9, platelets 536,000, sodium 138, potassium 3.9, bicarb 24, BUN 7, creatinine 0.89, serum glucose 113, lipase 28, i-STAT beta-hCG <5.0.  Urinalysis negative for UTI.  UDS collected and pending. ? ?Portable chest x-ray negative for focal consolidation, edema, effusion. ? ?Patient was given IV Dilaudid 2 mg x 2, IV Toradol 15 mg, IV fentanyl 50 mcg, 1 L LR.  The hospitalist service was consulted to admit for further evaluation and management. ? ?Review of Systems: All systems reviewed and are negative except as documented in history of present illness above. ? ? ?Past Medical History:  ?Diagnosis Date  ? Acute kidney injury ([place]) 05/15/2016  ? Amenorrhea   ? Anemia   ? SICKLE CELL  ? Asthma   ? Drug-induced pruritus 02/26/2015  ? GERD (gastroesophageal reflux disease)   ? Headache   ? Hyperbilirubinemia 02/26/2015  ? Sickle cell disease ([place])   ? ? ?Past  Surgical History:  ?Procedure Laterality Date  ? 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 TONSILLECTOMY    ? Age 58  ? ? ?Social History: ? reports that she has been smoking cigarettes. She has a 5.00 pack-year smoking history. She has never used smokeless tobacco. She reports that she does not currently use alcohol after a past usage of about 6.0 standard drinks per week. She reports current drug use. Drugs: Marijuana and Cocaine. ? ?No Known Allergies ? ?Family History  ?Problem Relation Age of Onset  ? Hypertension Paternal Grandfather   ? Sickle cell trait Father   ? Cancer Mother   ?     Died in 01/24/2009  ? ? ? ?Prior to Admission medications   ?Medication Sig Start Date End Date Taking? Authorizing Provider  ?ibuprofen (ADVIL) 200 MG tablet Take 200 mg by mouth every 8 (eight) hours as needed for moderate pain.    [provider]  ?ondansetron (ZOFRAN) 4 MG tablet Take 1 tablet (4 mg total) by mouth every 6 (six) hours. 11/18/21   Prosperi, Darrick Meigs H, PA-C  ? ? ?Physical Exam: ?Vitals:  ? 03/10/22 1224 03/10/22 1425 03/10/22 1752  ?BP: (!) 129/101 (!) 112/93 129/79  ?Pulse: 81 64 86  ?Resp: (!) 24 16 (!) 22  ?Temp: 98 ?F (36.7 ?C)    ?TempSrc: Oral    ?SpO2: 98% 95% 93%  ? ?Constitutional: Resting in bed, somnolent but easily arousable ?Eyes: PERRL, lids and conjunctivae normal ?ENMT: Mucous membranes are dry.  Posterior pharynx clear of any exudate or lesions.Normal dentition.  ?Neck: normal, supple, no masses. ?Respiratory: clear to auscultation bilaterally, no wheezing, no crackles. Normal respiratory effort. No accessory muscle use.  ?Cardiovascular: Regular rate and rhythm, no murmurs / rubs / gallops. No extremity edema. ?Abdomen: no tenderness, no masses palpated. No hepatosplenomegaly. Bowel sounds positive.  ?Musculoskeletal: no clubbing / cyanosis. No joint deformity upper and lower extremities. Good ROM, no contractures. Normal muscle tone.  ?Skin: no rashes, lesions, ulcers. No  induration ?Neurologic: Sensation intact. Strength 5/5 in all 4.  ?Psychiatric: Somnolent but easily arousable.  Alert and oriented x 3. ? ?EKG: Personally reviewed. Sinus rhythm, rate 94, motion artifact.  Rate is faster when compared to prior. ? ?Assessment/Plan ?Principal Problem: ?  Sickle cell anemia with crisis ([place]) ?Active Problems: ?  Leukocytosis ?  ?[name] is a 26 y.o. female with medical history significant for sickle cell anemia, mild intermittent asthma, history of substance use who is admitted for sickle cell pain crisis. ? ?Assessment and Plan: ?* Sickle cell anemia with crisis ([place]) ?Admitted for continued pain control.  Does not appear that she has been following closely with sickle cell clinic and does not have any recent prescriptions for pain control as an outpatient. ?-Start on IV Dilaudid 1 mg every 2 hours as needed with hold parameters ?-Continue IV Toradol 15 mg every 6 hours ?-IV fluids with D5-1/2 NS'@100'$  mL/hour ?-Hemoglobin relatively stable, no indication for transfusion at this time ?-Admit to [facility] for sickle cell team ? ?Leukocytosis ?Acute on chronic leukocytosis, likely some component of hemoconcentration.  No obvious infection at this point.  Continue to monitor. ? ?DVT prophylaxis: enoxaparin (LOVENOX) injection 40 mg Start: 03/10/22 1900 ?Code Status: Full code ?Family Communication: Discussed with patient, she has discussed with family ?Disposition Plan: From home, dispo pending clinical progress ?Consults called: None ?Severity of Illness: ?The appropriate patient status for this patient is OBSERVATION. Observation status is judged to be reasonable and necessary in order to provide the required intensity of service to ensure the patient's safety. The patient's presenting symptoms, physical exam findings, and initial radiographic and laboratory data in the context of their medical condition is felt to place them at decreased risk for further clinical  deterioration. Furthermore, it is anticipated that the patient will be medically stable for discharge from the hospital within 2 midnights of admission.   ?Zada Finders MD ?[facility] ? ?If 7PM-7AM, please contact night-coverage ?[email or web address] ? ?03/10/2022, 6:56 PM  ?

## 2022-03-10 NOTE — ED Notes (Signed)
Patient screaming asking for pain medication. This RN brought dialudid, toradol and fluid give the patient, found that patient has pulled her IV out. When asked what happened to the IV, patient screamed and asked this RN to leave the room. Charge nurse made aware. Provider will be notified.  ?

## 2022-03-10 NOTE — ED Notes (Addendum)
Pt came out of her room and screamed that she wanted to go to [facility] and slammed the door shut. Pt proceeds to cry and scream in her room. RN aware and security called. Security with pt at this time.  ?

## 2022-03-10 NOTE — ED Notes (Addendum)
RN went to start fluids on pt. Pt stated she had to pee again. RN walked pt to bathroom. Pt said "I am done peeing." No urine in the hat in the toilet. RN walked pt back to bed and connected pt to fluids.  ?

## 2022-03-10 NOTE — Assessment & Plan Note (Addendum)
Admitted for continued pain control.  Does not appear that she has been following closely with sickle cell clinic and does not have any recent prescriptions for pain control as an outpatient. ?-Start on IV Dilaudid 1 mg every 2 hours as needed with hold parameters ?-Continue IV Toradol 15 mg every 6 hours ?-IV fluids with D5-1/2 NS'@100'$  mL/hour ?-Hemoglobin relatively stable, no indication for transfusion at this time ?-Admit to [facility] for sickle cell team ?

## 2022-03-10 NOTE — Assessment & Plan Note (Signed)
Acute on chronic leukocytosis, likely some component of hemoconcentration.  No obvious infection at this point.  Continue to monitor. ?

## 2022-03-10 NOTE — ED Provider Notes (Cosign Needed)
?  Physical Exam  ?BP 129/79   Pulse 86   Temp 98 ?F (36.7 ?C) (Oral)   Resp (!) 22   SpO2 93%  ? ?Physical Exam ? ?Procedures  ?Procedures ? ?ED Course / MDM  ?  ?Medical Decision Making ?Amount and/or Complexity of Data Reviewed ?Labs: ordered. ?Radiology: ordered. ? ?Risk ?Prescription drug management. ? ? ?Hand-off received from Debbe Mounts, PA '@1500'$ . Please see their full note for entire history and physical exam. ? ?Patient has a history of sickle cell disease and presented to the ED with back pain, knee pain, abdominal pain consistent with prior sickle cell pain crises.  Labs so far are notable for leukocytosis with WBC of 24.2, hemoglobin of 7.9 (appears to the patient's baseline).  CMP notable for mild hyperbilirubinemia 3.5.  Lipase is normal.  Chest x-ray without concerns for acute chest or other abnormalities.  Patient has received IV Benadryl, and 2 mg of IV Dilaudid x2 for pain control.  Plan to follow-up reticulocyte count and reassess. ? ?On reevaluation, patient is very somnolent, but upon awakening continues to endorse severe pain.  She will intermittently fall asleep during my conversation with her but then awakens yelling out in pain.  Patient was also given 1 L of IV fluids as well as IV fentanyl and IV Toradol.  Reticulocyte count is elevated to 16.9% which appears consistent with her prior episodes of sickle cell pain crisis. ? ?Given patient continues to have severe pain despite multiple doses of IV pain medications, will admit for further management.  I spoke with the medicine admitting team and she was admitted to their service in stable condition. ?  ?Sondra Come, MD ?03/10/22 2024 ? ?

## 2022-03-10 NOTE — ED Notes (Signed)
Pt standing beside stretcher connected to fluids stating she has to pee again. RN walked pt to bathroom to urinate. Pt then stated "I am done peeing." No urine in the toilet. RN walked pt back to bed. ?

## 2022-03-10 NOTE — ED Triage Notes (Signed)
Patient BIB [facility] from home with complaint of sickle cell pain since yesterday all over her body. Patient states she ran out of her home oxycodone, received 83mg fentanyl from EMS PTA. Patient is alert and oriented at this time.  ?

## 2022-03-10 NOTE — ED Notes (Signed)
Patient now is agreeable to treatment and request this RN to give her medication.  ?

## 2022-03-10 NOTE — ED Notes (Signed)
RN found pt wondering away from stretcher connected to fluids. Pt stated "I have to pee." RN informed pt to wait for RN to disconnect her from her fluids she RN could walk her to the bathroom. Pt began yelling at RN "I have to pee and you cannot control when I pee and do not." RN explained to pt that she was going to pull her IV out. Pt disconnected from fluids and walked to the bathroom. Pt did not urinate. Will continue to monitor.  ?

## 2022-03-10 NOTE — ED Provider Notes (Signed)
[facility] EMERGENCY DEPARTMENT Provider Note   CSN: [id number] Arrival date & time: 03/10/22  1220     History  No chief complaint on file.   [name] is a 26 y.o. female with a history of sickle cell disease, asthma, GERD.  Presents to the emergency department with a complaint of bilateral knee pain, back pain, and abdominal pain.  Patient reports that this pain began on Monday and has been constant since then.  Reports that pain is consistent with previous sickle cell pain crisis that she has had in the past.  Patient reports that she ran out of pain medications "a few days ago."  Without pain medication patient has been unable to control her pain adequately and came to the emergency department for further management.  Patient denies any recent falls or traumatic injuries.  Denies any fever, chills, chest pain, shortness of breath, nausea, vomiting, blood in stool, melena, dysuria, hematuria, urinary urgency, vaginal pain, vaginal bleeding, vaginal discharge, numbness, weakness, saddle anesthesia, bowel/bladder dysfunction.  HPI     Home Medications Prior to Admission medications   Medication Sig Start Date End Date Taking? Authorizing Provider  ibuprofen (ADVIL) 200 MG tablet Take 200 mg by mouth every 8 (eight) hours as needed for moderate pain.    [provider]  ondansetron (ZOFRAN) 4 MG tablet Take 1 tablet (4 mg total) by mouth every 6 (six) hours. 11/18/21   Prosperi, Christian H, PA-C      Allergies    Patient has no known allergies.    Review of Systems   Review of Systems  Constitutional:  Negative for chills and fever.  Eyes:  Negative for visual disturbance.  Respiratory:  Negative for shortness of breath.   Cardiovascular:  Negative for chest pain.  Gastrointestinal:  Positive for abdominal pain. Negative for nausea and vomiting.  Genitourinary:  Negative for difficulty urinating, dysuria, frequency, genital sores,  hematuria, pelvic pain, urgency, vaginal bleeding, vaginal discharge and vaginal pain.  Musculoskeletal:  Positive for arthralgias and back pain. Negative for neck pain.  Skin:  Negative for color change and rash.  Neurological:  Negative for dizziness, syncope, weakness, light-headedness, numbness and headaches.  Psychiatric/Behavioral:  Negative for confusion.    Physical Exam Updated Vital Signs BP (!) 129/101   Pulse 81   Temp 98 F (36.7 C) (Oral)   Resp (!) 24   SpO2 98%  Physical Exam Vitals and nursing note reviewed.  Constitutional:      General: She is not in acute distress.    Appearance: She is not ill-appearing, toxic-appearing or diaphoretic.  HENT:     Head: Normocephalic.  Eyes:     General: No scleral icterus.       Right eye: No discharge.        Left eye: No discharge.  Cardiovascular:     Rate and Rhythm: Normal rate.     Pulses:          Radial pulses are 2+ on the right side and 2+ on the left side.  Pulmonary:     Effort: Pulmonary effort is normal. No tachypnea, bradypnea or respiratory distress.     Breath sounds: Normal breath sounds. No stridor.  Abdominal:     General: Abdomen is flat. Bowel sounds are normal. There is no distension. There are no signs of injury.     Palpations: Abdomen is soft. There is no mass or pulsatile mass.     Tenderness: There is  generalized abdominal tenderness. There is no guarding or rebound.     Hernia: There is no hernia in the umbilical area or ventral area.     Comments: Minimal diffuse tenderness throughout entire abdomen  Musculoskeletal:     Cervical back: No swelling, edema, deformity, erythema, signs of trauma, lacerations, rigidity, spasms, torticollis, tenderness, bony tenderness or crepitus. No pain with movement. Normal range of motion.     Thoracic back: No swelling, edema, deformity, signs of trauma, lacerations, spasms, tenderness or bony tenderness.     Lumbar back: No swelling, edema, deformity, signs  of trauma, lacerations, spasms, tenderness or bony tenderness.     Right upper leg: No swelling, edema, deformity, lacerations, tenderness or bony tenderness.     Left upper leg: No swelling, edema, deformity, lacerations, tenderness or bony tenderness.     Right knee: Bony tenderness present. No swelling, deformity, effusion, erythema, ecchymosis, lacerations or crepitus. Normal range of motion. Tenderness present. Normal alignment.     Left knee: Bony tenderness present. No swelling, deformity, effusion, erythema, ecchymosis or lacerations. Normal range of motion. Tenderness present.     Right lower leg: Normal.     Left lower leg: Normal.     Right ankle: No swelling, deformity, ecchymosis or lacerations. No tenderness. Normal range of motion.     Left ankle: No swelling, deformity, ecchymosis or lacerations. No tenderness. Normal range of motion.     Right foot: Normal range of motion and normal capillary refill. No swelling, deformity, laceration, tenderness, bony tenderness or crepitus. Normal pulse.     Left foot: Normal range of motion and normal capillary refill. No swelling, deformity, laceration, tenderness, bony tenderness or crepitus. Normal pulse.     Comments: No midline tenderness or deformity to cervical, thoracic, lumbar spine.  Diffuse tenderness to bilateral lumbar back.  Diffuse tenderness to bilateral knees.  Patient has full range of motion to bilateral knees.  No swelling, deformity, erythema, or warmth noted.  Skin:    General: Skin is warm and dry.  Neurological:     General: No focal deficit present.     Mental Status: She is alert.  Psychiatric:        Behavior: Behavior is cooperative.    ED Results / Procedures / Treatments   Labs (all labs ordered are listed, but only abnormal results are displayed) Labs Reviewed  CBC WITH DIFFERENTIAL/PLATELET - Abnormal; Notable for the following components:      Result Value   WBC 24.2 (*)    RBC 2.25 (*)    Hemoglobin  7.9 (*)    HCT 21.9 (*)    MCH 35.1 (*)    MCHC 36.1 (*)    RDW 23.2 (*)    Platelets 536 (*)    Neutro Abs 15.7 (*)    Lymphs Abs 7.7 (*)    nRBC 22 (*)    All other components within normal limits  COMPREHENSIVE METABOLIC PANEL - Abnormal; Notable for the following components:   Glucose, Bld 113 (*)    Total Bilirubin 3.5 (*)    All other components within normal limits  URINE CULTURE  LIPASE, BLOOD  RETICULOCYTES  URINALYSIS, ROUTINE W REFLEX MICROSCOPIC  I-STAT BETA HCG BLOOD, ED ([facility], [facility], [facility] ONLY)    EKG None  Radiology DG Chest 1 View  Result Date: 03/10/2022 CLINICAL DATA:  Sickle cell crisis. EXAM: CHEST  1 VIEW COMPARISON:  Chest x-ray 02/07/2021 FINDINGS: The heart is within normal limits in size given the  AP projection. The mediastinal and hilar contours are normal. The lungs are clear. No infiltrates or effusions. The bony thorax is intact. IMPRESSION: No acute cardiopulmonary findings. Electronically Signed   By: Marijo Sanes M.D.   On: 03/10/2022 13:09    Procedures Procedures    Medications Ordered in ED Medications  HYDROmorphone (DILAUDID) injection 2 mg (has no administration in time range)  HYDROmorphone (DILAUDID) injection 2 mg (has no administration in time range)  HYDROmorphone (DILAUDID) injection 2 mg (has no administration in time range)  morphine (MSIR) tablet 30 mg (has no administration in time range)  diphenhydrAMINE (BENADRYL) 12.5 mg in sodium chloride 0.9 % 50 mL IVPB (has no administration in time range)  ondansetron (ZOFRAN) injection 4 mg (has no administration in time range)    ED Course/ Medical Decision Making/ A&P                           Medical Decision Making Amount and/or Complexity of Data Reviewed Labs: ordered. Radiology: ordered.  Risk Prescription drug management.   Alert 26 year old female in no acute distress, nontoxic-appearing.  Patient appears uncomfortable due to complaints of pain.  Presents to the ED with  a chief complaint of bilateral knee pain, back pain, and abdominal pain.  Patient attributes her pain to sickle cell pain crisis.  Information obtained from patient.  Past medical records were reviewed including previous provider notes, labs, and imaging.  Patient has medical history as outlined in HPI which complicates her care.  With known sickle cell disease and reports of pain being similar to previous sickle cell pain crisis will administer Dilaudid for pain management.  We will check reticulocytes, chest x-ray, and EKG.  With reports of abdominal pain additionally will obtain CMP, CBC, lipase, urinalysis, and beta-hCG.  I personally viewed interpret patient's chest x-ray.  Agree with radiology interpretation of no active cardiopulmonary disease.  I personally viewed his interpreted patient's lab results.  Pertinent findings include: -Leukocytosis at 24.2 -Hemoglobin 7.9 -Total bili 3.5 -Lipase within normal limits -hCG less than 5  Patient has history of elevated white count previously.  Patient denies any fevers at home.  Is afebrile here.  No signs of pneumonia on chest x-ray.  We will obtain urine sample to look for urinary source of leukocytosis.  Hemoglobin 7.9.  Patient appears to be at her baseline.  Patient's pain appears well controlled as she is sleeping comfortably at this time.  Patient is easily arousable to voice.    Patient care discussed with attending physician Dr.Haviland.    Disposition pending urinalysis and leukocyte count.  Patient care transferred to Dr.Foster at the end of my shift. Patient presentation, ED course, and plan of care discussed with review of all pertinent labs and imaging. Please see his/her note for further details regarding further ED course and disposition.         Final Clinical Impression(s) / ED Diagnoses Final diagnoses:  None    Rx / DC Orders ED Discharge Orders     None         Dyann Ruddle 03/10/22 1525    Isla Pence, MD 03/21/22 2005

## 2022-03-10 NOTE — ED Notes (Signed)
Pt got up from stretcher and proceeded to try and pee on the ground. RN informed and guided pt to the bathroom located across from her stretcher. RN place hat in toilet to cat urine for UA. Pt proceeded to urinate missing the hat in the toilet. Pt then fell asleep on the toilet, RN and tech guided pt back to her stretcher. RN went to get water and encouraged the pt to drink. Pt arousable but unable to follow directions. MD notified.  ?

## 2022-03-11 LAB — URINE CULTURE: Culture: NO GROWTH

## 2022-03-11 NOTE — ED Notes (Signed)
Called [facility] to transport patient to [facility]. Accepting Earlean Polka.  ?

## 2022-03-11 NOTE — ED Notes (Signed)
Pt walked out of room full dressed stating, "I'm going to go home." Pt told that she has been accepted to [facility] and that [facility] transport has been called and is on the way. Patient stated "I don't care, I'm ready to go home." Pt adamant about leaving the facility. Education attempted unsuccessful. RN removed patient IV and patient ambulated out of facility. [facility] notified by Network engineer and canceled as well as admitting MD. RN spoke with receiving Elvina Sidle RN and notified them of patient leaving AMA. Charge nurse notified.    ?

## 2022-03-12 ENCOUNTER — Encounter (HOSPITAL_COMMUNITY): Payer: Self-pay

## 2022-03-12 ENCOUNTER — Inpatient Hospital Stay (HOSPITAL_COMMUNITY): Payer: Medicaid Other

## 2022-03-12 ENCOUNTER — Other Ambulatory Visit: Payer: Self-pay

## 2022-03-12 ENCOUNTER — Inpatient Hospital Stay (HOSPITAL_COMMUNITY)
Admission: EM | Admit: 2022-03-12 | Discharge: 2022-03-16 | DRG: 812 | Discharge status: Against Medical Advice | Payer: Medicaid Other | Attending: Internal Medicine | Admitting: Internal Medicine

## 2022-03-12 DIAGNOSIS — D638 Anemia in other chronic diseases classified elsewhere: Secondary | ICD-10-CM | POA: Diagnosis present

## 2022-03-12 DIAGNOSIS — D72829 Elevated white blood cell count, unspecified: Secondary | ICD-10-CM | POA: Diagnosis present

## 2022-03-12 DIAGNOSIS — Z8249 Family history of ischemic heart disease and other diseases of the circulatory system: Secondary | ICD-10-CM

## 2022-03-12 DIAGNOSIS — Z720 Tobacco use: Secondary | ICD-10-CM | POA: Diagnosis present

## 2022-03-12 DIAGNOSIS — D75839 Thrombocytosis, unspecified: Secondary | ICD-10-CM | POA: Diagnosis present

## 2022-03-12 DIAGNOSIS — Z809 Family history of malignant neoplasm, unspecified: Secondary | ICD-10-CM | POA: Diagnosis not present

## 2022-03-12 DIAGNOSIS — F1414 Cocaine abuse with cocaine-induced mood disorder: Secondary | ICD-10-CM | POA: Diagnosis present

## 2022-03-12 DIAGNOSIS — D5700 Hb-SS disease with crisis, unspecified: Secondary | ICD-10-CM | POA: Diagnosis not present

## 2022-03-12 DIAGNOSIS — Z832 Family history of diseases of the blood and blood-forming organs and certain disorders involving the immune mechanism: Secondary | ICD-10-CM

## 2022-03-12 DIAGNOSIS — F17210 Nicotine dependence, cigarettes, uncomplicated: Secondary | ICD-10-CM | POA: Diagnosis present

## 2022-03-12 DIAGNOSIS — F1410 Cocaine abuse, uncomplicated: Secondary | ICD-10-CM | POA: Diagnosis present

## 2022-03-12 DIAGNOSIS — F17200 Nicotine dependence, unspecified, uncomplicated: Secondary | ICD-10-CM | POA: Diagnosis present

## 2022-03-12 DIAGNOSIS — F1494 Cocaine use, unspecified with cocaine-induced mood disorder: Secondary | ICD-10-CM | POA: Diagnosis present

## 2022-03-12 DIAGNOSIS — K219 Gastro-esophageal reflux disease without esophagitis: Secondary | ICD-10-CM | POA: Diagnosis present

## 2022-03-12 DIAGNOSIS — M79602 Pain in left arm: Secondary | ICD-10-CM

## 2022-03-12 DIAGNOSIS — Z9081 Acquired absence of spleen: Secondary | ICD-10-CM | POA: Diagnosis not present

## 2022-03-12 LAB — CBC WITH DIFFERENTIAL/PLATELET
Abs Immature Granulocytes: 0.46 10*3/uL — ABNORMAL HIGH (ref 0.00–0.07)
Basophils Absolute: 0.1 10*3/uL (ref 0.0–0.1)
Basophils Relative: 0 %
Eosinophils Absolute: 0 10*3/uL (ref 0.0–0.5)
Eosinophils Relative: 0 %
HCT: 18.5 % — ABNORMAL LOW (ref 36.0–46.0)
Hemoglobin: 6.9 g/dL — CL (ref 12.0–15.0)
Immature Granulocytes: 3 %
Lymphocytes Relative: 25 %
Lymphs Abs: 4.3 10*3/uL — ABNORMAL HIGH (ref 0.7–4.0)
MCH: 35.9 pg — ABNORMAL HIGH (ref 26.0–34.0)
MCHC: 37.3 g/dL — ABNORMAL HIGH (ref 30.0–36.0)
MCV: 96.4 fL (ref 80.0–100.0)
Monocytes Absolute: 1.1 10*3/uL — ABNORMAL HIGH (ref 0.1–1.0)
Monocytes Relative: 7 %
Neutro Abs: 11 10*3/uL — ABNORMAL HIGH (ref 1.7–7.7)
Neutrophils Relative %: 65 %
Platelets: 531 10*3/uL — ABNORMAL HIGH (ref 150–400)
RBC: 1.92 MIL/uL — ABNORMAL LOW (ref 3.87–5.11)
RDW: 24.6 % — ABNORMAL HIGH (ref 11.5–15.5)
WBC: 17 10*3/uL — ABNORMAL HIGH (ref 4.0–10.5)
nRBC: 21.8 % — ABNORMAL HIGH (ref 0.0–0.2)

## 2022-03-12 LAB — I-STAT BETA HCG BLOOD, ED (MC, WL, AP ONLY): I-stat hCG, quantitative: <5 m[IU]/mL

## 2022-03-12 LAB — COMPREHENSIVE METABOLIC PANEL
ALT: 16 U/L (ref 0–44)
AST: 22 U/L (ref 15–41)
Albumin: 3.6 g/dL (ref 3.5–5.0)
Alkaline Phosphatase: 64 U/L (ref 38–126)
Anion gap: 10 (ref 5–15)
BUN: 6 mg/dL (ref 6–20)
CO2: 22 mmol/L (ref 22–32)
Calcium: 8.7 mg/dL — ABNORMAL LOW (ref 8.9–10.3)
Chloride: 106 mmol/L (ref 98–111)
Creatinine, Ser: 0.59 mg/dL (ref 0.44–1.00)
GFR, Estimated: >60 mL/min (ref >60)
Glucose, Bld: 80 mg/dL (ref 70–99)
Potassium: 3.6 mmol/L (ref 3.5–5.1)
Sodium: 138 mmol/L (ref 135–145)
Total Bilirubin: 3.3 mg/dL — ABNORMAL HIGH (ref 0.3–1.2)
Total Protein: 7.2 g/dL (ref 6.5–8.1)

## 2022-03-12 LAB — RETICULOCYTES
Immature Retic Fract: 36.1 % — ABNORMAL HIGH (ref 2.3–15.9)
RBC.: 1.9 MIL/uL — ABNORMAL LOW (ref 3.87–5.11)
Retic Count, Absolute: 448 10*3/uL — ABNORMAL HIGH (ref 19.0–186.0)
Retic Ct Pct: 19.1 % — ABNORMAL HIGH (ref 0.4–3.1)

## 2022-03-12 MED ORDER — HYDROMORPHONE HCL 2 MG/ML IJ SOLN
2.0000 mg | INTRAMUSCULAR | Status: AC
  Administered 2022-03-12: 2 mg via INTRAVENOUS
  Filled 2022-03-12: qty 1

## 2022-03-12 MED ORDER — OXYCODONE-ACETAMINOPHEN 7.5-325 MG PO TABS
1.0000 | ORAL_TABLET | Freq: Three times a day (TID) | ORAL | Status: DC | PRN
  Administered 2022-03-12: 1 via ORAL
  Filled 2022-03-12: qty 1

## 2022-03-12 MED ORDER — HYDROMORPHONE HCL 1 MG/ML IJ SOLN
1.0000 mg | Freq: Once | INTRAMUSCULAR | Status: AC
  Administered 2022-03-12: 1 mg via INTRAVENOUS
  Filled 2022-03-12: qty 1

## 2022-03-12 MED ORDER — NALOXONE HCL 0.4 MG/ML IJ SOLN: 0.4000 mg | INTRAMUSCULAR | Status: DC | PRN

## 2022-03-12 MED ORDER — HYDROMORPHONE HCL 2 MG/ML IJ SOLN: 2.0000 mg | INTRAMUSCULAR | Status: DC

## 2022-03-12 MED ORDER — KETOROLAC TROMETHAMINE 15 MG/ML IJ SOLN
15.0000 mg | Freq: Four times a day (QID) | INTRAMUSCULAR | Status: AC
  Administered 2022-03-12 – 2022-03-13 (×5): 15 mg via INTRAVENOUS
  Filled 2022-03-12 (×5): qty 1

## 2022-03-12 MED ORDER — SENNOSIDES-DOCUSATE SODIUM 8.6-50 MG PO TABS
1.0000 | ORAL_TABLET | Freq: Two times a day (BID) | ORAL | Status: DC
  Administered 2022-03-12 – 2022-03-15 (×6): 1 via ORAL
  Filled 2022-03-12 (×8): qty 1

## 2022-03-12 MED ORDER — POLYETHYLENE GLYCOL 3350 17 G PO PACK: 17.0000 g | PACK | Freq: Every day | ORAL | Status: DC | PRN

## 2022-03-12 MED ORDER — ONDANSETRON HCL 4 MG/2ML IJ SOLN: 4.0000 mg | Freq: Four times a day (QID) | INTRAMUSCULAR | Status: DC | PRN

## 2022-03-12 MED ORDER — HYDROMORPHONE 1 MG/ML IV SOLN
INTRAVENOUS | Status: DC
  Administered 2022-03-12: 3.5 mg via INTRAVENOUS
  Administered 2022-03-12: 2.4 mg via INTRAVENOUS
  Administered 2022-03-13: 3.5 mg via INTRAVENOUS
  Administered 2022-03-13: 3 mg via INTRAVENOUS
  Administered 2022-03-13: 2 mg via INTRAVENOUS
  Administered 2022-03-13: 2.5 mg via INTRAVENOUS

## 2022-03-12 MED ORDER — HYDROMORPHONE 1 MG/ML IV SOLN
INTRAVENOUS | Status: DC
  Administered 2022-03-12: 0.5 mg via INTRAVENOUS
  Administered 2022-03-12: 30 mg via INTRAVENOUS
  Administered 2022-03-12: 1.4 mg via INTRAVENOUS
  Filled 2022-03-12: qty 30

## 2022-03-12 MED ORDER — DEXTROSE-NACL 5-0.45 % IV SOLN: INTRAVENOUS | Status: AC

## 2022-03-12 MED ORDER — ORAL CARE MOUTH RINSE
15.0000 mL | Freq: Two times a day (BID) | OROMUCOSAL | Status: DC
  Administered 2022-03-12 – 2022-03-13 (×3): 15 mL via OROMUCOSAL

## 2022-03-12 MED ORDER — ENOXAPARIN SODIUM 40 MG/0.4ML IJ SOSY
40.0000 mg | PREFILLED_SYRINGE | INTRAMUSCULAR | Status: DC
  Administered 2022-03-12 – 2022-03-15 (×3): 40 mg via SUBCUTANEOUS
  Filled 2022-03-12 (×4): qty 0.4

## 2022-03-12 MED ORDER — SODIUM CHLORIDE 0.9% FLUSH
9.0000 mL | INTRAVENOUS | Status: DC | PRN
Start: 2022-03-12 — End: 2022-03-16

## 2022-03-12 MED ORDER — DIPHENHYDRAMINE HCL 25 MG PO CAPS
25.0000 mg | ORAL_CAPSULE | ORAL | Status: DC | PRN
  Administered 2022-03-12 – 2022-03-13 (×2): 25 mg via ORAL
  Filled 2022-03-12 (×2): qty 1

## 2022-03-12 NOTE — ED Notes (Signed)
Pt given food and drink per request. 

## 2022-03-12 NOTE — ED Notes (Signed)
Pt observed to be very somnolent from previously being with patient. Patient needed to be awakened during conversation.  Patient denies taking anything.  Pt states she is just tired. PA at bedside. ?

## 2022-03-12 NOTE — ED Notes (Signed)
Pt 02 sat=89% after dilaudid, 2LNC applied, 02=93%, PA notified. ?

## 2022-03-12 NOTE — H&P (Signed)
?History and Physical  ? ? ?[name] IRW:[id number] DOB: 1996/04/07 DOA: 03/12/2022 ? ?PCP: Dorena Dew, FNP  ?Patient coming from: Home. ? ?Chief Complaint: Pain. ? ?HPI: [name] is a 26 y.o. female with history of sickle cell anemia presents to the ER because of worsening pain in the left upper extremity for the last couple of days.  Patient states this is typical of her sickle cell pain crisis it.  Denies any trauma fever chills. ? ?ED Course: In the ER on exam patient has good pulses in the left upper extremity.  Movements are restricted because of pain.  CT head is unremarkable.  Patient admitted for sickle cell pain crisis.  Hemoglobin is around 6.9. ? ?Review of Systems: As per HPI, rest all negative. ? ? ?Past Medical History:  ?Diagnosis Date  ? Acute kidney injury ([place]) 05/15/2016  ? Amenorrhea   ? Anemia   ? SICKLE CELL  ? Asthma   ? Drug-induced pruritus 02/26/2015  ? GERD (gastroesophageal reflux disease)   ? Headache   ? Hyperbilirubinemia 02/26/2015  ? Sickle cell disease ([place])   ? ? ?Past Surgical History:  ?Procedure Laterality Date  ? 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 TONSILLECTOMY    ? Age 35  ? ? ? reports that she has been smoking cigarettes. She has a 5.00 pack-year smoking history. She has never used smokeless tobacco. She reports that she does not currently use alcohol after a past usage of about 6.0 standard drinks per week. She reports current drug use. Drugs: Marijuana and Cocaine. ? ?No Known Allergies ? ?Family History  ?Problem Relation Age of Onset  ? Hypertension Paternal Grandfather   ? Sickle cell trait Father   ? Cancer Mother   ?     Died in 01-30-09  ? ? ?Prior to Admission medications   ?Medication Sig Start Date End Date Taking? Authorizing Provider  ?ibuprofen (ADVIL) 200 MG tablet Take 200 mg by mouth every 8 (eight) hours as needed for moderate pain. ?Patient not taking: Reported on 03/12/2022    [provider]  ?ondansetron  (ZOFRAN) 4 MG tablet Take 1 tablet (4 mg total) by mouth every 6 (six) hours. ?Patient not taking: Reported on 03/10/2022 11/18/21   Prosperi, Christian H, PA-C  ? ? ?Physical Exam: ?Constitutional: Moderately built and nourished. ?Vitals:  ? 03/12/22 0330 03/12/22 0400 03/12/22 0415 03/12/22 0430  ?BP: 118/80 (!) 145/90 124/79 122/69  ?Pulse: 74 92 84 71  ?Resp: 20 (!) '22 14 19  '$ ?Temp:      ?TempSrc:      ?SpO2: 99% 93% 99% 100%  ?Weight:      ?Height:      ? ?Eyes: Anicteric no pallor. ?ENMT: No discharge from the ears eyes nose and mouth. ?Neck: No mass felt.  No neck rigidity. ?Respiratory: No rhonchi or crepitations. ?Cardiovascular: S1-S2 heard. ?Abdomen: Soft nontender bowel sound present. ?Musculoskeletal: Pain on the left upper extremity.  Good pulses. ?Skin: No rash. ?Neurologic: Alert awake oriented time place and person.  Moves all extremities.  Left upper extremity movement is restricted because of pain. ?Psychiatric: Appears normal.  Normal affect. ? ? ?Labs on Admission: I have personally reviewed following labs and imaging studies ? ?CBC: ?Recent Labs  ?Lab 03/10/22 ?1337 03/12/22 ?0205  ?WBC 24.2* 17.0*  ?NEUTROABS 15.7* 11.0*  ?HGB 7.9* 6.9*  ?HCT 21.9* 18.5*  ?MCV 97.3 96.4  ?PLT 536* 531*  ? ?Basic Metabolic  Panel: ?Recent Labs  ?Lab 03/10/22 ?1337 03/12/22 ?0205  ?NA 138 138  ?K 3.9 3.6  ?CL 107 106  ?CO2 24 22  ?GLUCOSE 113* 80  ?BUN 7 6  ?CREATININE 0.89 0.59  ?CALCIUM 8.9 8.7*  ? ?GFR: ?Estimated Creatinine Clearance: 98.9 mL/min (by C-G formula based on SCr of 0.59 mg/dL). ?Liver Function Tests: ?Recent Labs  ?Lab 03/10/22 ?1337 03/12/22 ?0205  ?AST 19 22  ?ALT 15 16  ?ALKPHOS 84 64  ?BILITOT 3.5* 3.3*  ?PROT 6.8 7.2  ?ALBUMIN 3.7 3.6  ? ?Recent Labs  ?Lab 03/10/22 ?1337  ?LIPASE 28  ? ?No results for input(s): AMMONIA in the last 168 hours. ?Coagulation Profile: ?No results for input(s): INR, PROTIME in the last 168 hours. ?Cardiac Enzymes: ?No results for input(s): CKTOTAL, CKMB, CKMBINDEX,  TROPONINI in the last 168 hours. ?BNP (last 3 results) ?No results for input(s): PROBNP in the last 8760 hours. ?HbA1C: ?No results for input(s): HGBA1C in the last 72 hours. ?CBG: ?No results for input(s): GLUCAP in the last 168 hours. ?Lipid Profile: ?No results for input(s): CHOL, HDL, LDLCALC, TRIG, CHOLHDL, LDLDIRECT in the last 72 hours. ?Thyroid Function Tests: ?No results for input(s): TSH, T4TOTAL, FREET4, T3FREE, THYROIDAB in the last 72 hours. ?Anemia Panel: ?Recent Labs  ?  03/10/22 ?1337 03/12/22 ?0205  ?RETICCTPCT 16.9* 19.1*  ? ?Urine analysis: ?   ?Component Value Date/Time  ? [place] YELLOW 03/10/2022 1732  ? APPEARANCEUR CLEAR 03/10/2022 1732  ? LABSPEC 1.010 03/10/2022 1732  ? PHURINE 5.0 03/10/2022 1732  ? GLUCOSEU NEGATIVE 03/10/2022 1732  ? [place] NEGATIVE 03/10/2022 1732  ? [place] NEGATIVE 03/10/2022 1732  ? [place] NEGATIVE 03/10/2022 1732  ? PROTEINUR NEGATIVE 03/10/2022 1732  ? UROBILINOGEN 1.0 08/11/2015 1948  ? NITRITE NEGATIVE 03/10/2022 1732  ? LEUKOCYTESUR NEGATIVE 03/10/2022 1732  ? ?Sepsis Labs: ?'@LABRCNTIP'$ (procalcitonin:4,lacticidven:4) ?) ?Recent Results (from the past 240 hour(s))  ?Urine Culture     Status: None  ? Collection Time: 03/10/22 12:54 PM  ? Specimen: Urine, Clean Catch  ?Result Value Ref Range Status  ? Specimen Description URINE, CLEAN CATCH  Final  ? Special Requests NONE  Final  ? Culture   Final  ?  NO GROWTH ?Performed at [facility], [place] [address] [place], [place] [address] ?  ? Report Status 03/11/2022 FINAL  Final  ?  ? ?Radiological Exams on Admission: ?DG Chest 1 View ? ?Result Date: 03/10/2022 ?CLINICAL DATA:  Sickle cell crisis. EXAM: CHEST  1 VIEW COMPARISON:  Chest x-ray 02/07/2021 FINDINGS: The heart is within normal limits in size given the AP projection. The mediastinal and hilar contours are normal. The lungs are clear. No infiltrates or effusions. The bony thorax is intact. IMPRESSION: No acute cardiopulmonary findings.  Electronically Signed   By: Marijo Sanes M.D.   On: 03/10/2022 13:09   ? ? ? ?Assessment/Plan ?Principal Problem: ?  Sickle cell pain crisis ([place]) ?Active Problems: ?  Tobacco abuse ?  Anemia of chronic disease ?  ? ?Sickle cell pain crisis on Dilaudid PCA.  Toradol.  IV fluids.  Patient has pain mostly in the left upper extremity with movement restriction because of pain.  CT is unremarkable.  We will get Dopplers to rule out DVT. ?Anemia with mild worsening of patient's baseline hemoglobin.  Continue to monitor. ? ? ?Since patient has sickle cell pain crisis will need close monitoring and inpatient status. ? ? ?DVT prophylaxis: Lovenox. ?Code Status: Full code. ?Family Communication: Discussed with patient. ?  Disposition Plan: Home. ?Consults called: None. ?Admission status: Inpatient. ? ? ?Rise Patience MD ?[facility] ?Pager [phone]. ? ?If 7PM-7AM, please contact night-coverage ?[email or web address] ?Password [email or web address] ? ?03/12/2022, 5:07 AM  ? ? ? ?

## 2022-03-12 NOTE — ED Triage Notes (Signed)
Pt to ED via [facility].  Pt c/o left arm pain d/t sickle cell crisis. Pt states this is typical of her SCC.  Pt states she took two ultram earlier tonight w/o relief.  Pt A&Ox4, crying on arrival.   ? ?EMS VS  ?112/84 ?HR 94 ?02 sat=94% room air ?97.4 temp ?Cbg=106 ?

## 2022-03-12 NOTE — ED Provider Notes (Signed)
?[place] DEPT ?[facility] Emergency Department ?Provider Note ?MRN:  [id number]  ?Arrival date & time: 03/12/22    ? ?Chief Complaint   ?Sickle Cell Pain Crisis ?  ?History of Present Illness   ?[name] is a 26 y.o. year-old female presents to the ED with chief complaint of sickle cell pain in her left arm.  Was seen 2 days ago at [facility] for the same.  States that the pain never really went away.  She states that she took 2 tramadol today without relief.  She denies fever, chills, cough, chest pain.  States that this feels like her typical sickle cell pain. ? ?History provided by patient. ? ? ?Review of Systems  ?Pertinent review of systems noted in HPI.  ? ? ?Physical Exam  ? ?Vitals:  ? 03/12/22 0315 03/12/22 0330  ?BP: 124/80 118/80  ?Pulse: 77 74  ?Resp: 18 20  ?Temp:    ?SpO2: 97% 99%  ?  ?CONSTITUTIONAL:  uncomfortable-appearing, NAD ?NEURO:  Alert and oriented x 3, CN 3-12 grossly intact ?EYES:  eyes equal and reactive ?ENT/NECK:  Supple, no stridor  ?CARDIO:  mildly tachyardic, regular rhythm, appears well-perfused  ?PULM:  No respiratory distress,  ?GI/GU:  non-distended,  ?MSK/SPINE:  No gross deformities, no edema, moves all extremities  ?SKIN:  no rash, atraumatic ? ? ?*Additional and/or pertinent findings included in MDM below ? ?Diagnostic and Interventional Summary  ? ? EKG Interpretation ? ?Date/Time:    ?Ventricular Rate:    ?PR Interval:    ?QRS Duration:   ?QT Interval:    ?QTC Calculation:   ?R Axis:     ?Text Interpretation:   ?  ? ?  ? ?Labs Reviewed  ?COMPREHENSIVE METABOLIC PANEL - Abnormal; Notable for the following components:  ?    Result Value  ? Calcium 8.7 (*)   ? Total Bilirubin 3.3 (*)   ? All other components within normal limits  ?CBC WITH DIFFERENTIAL/PLATELET - Abnormal; Notable for the following components:  ? WBC 17.0 (*)   ? RBC 1.92 (*)   ? Hemoglobin 6.9 (*)   ? HCT 18.5 (*)   ? MCH 35.9 (*)   ? MCHC 37.3 (*)   ? RDW 24.6 (*)   ? Platelets 531 (*)   ?  nRBC 21.8 (*)   ? Neutro Abs 11.0 (*)   ? Lymphs Abs 4.3 (*)   ? Monocytes Absolute 1.1 (*)   ? Abs Immature Granulocytes 0.46 (*)   ? All other components within normal limits  ?RETICULOCYTES - Abnormal; Notable for the following components:  ? Retic Ct Pct 19.1 (*)   ? RBC. 1.90 (*)   ? Retic Count, Absolute 448.0 (*)   ? Immature Retic Fract 36.1 (*)   ? All other components within normal limits  ?I-STAT BETA HCG BLOOD, ED ([facility], [facility], [facility] ONLY)  ?  ?No orders to display  ?  ?Medications  ?diphenhydrAMINE (BENADRYL) capsule 25-50 mg (25 mg Oral Given 03/12/22 0225)  ?HYDROmorphone (DILAUDID) injection 2 mg (2 mg Intravenous Given 03/12/22 0226)  ?HYDROmorphone (DILAUDID) injection 1 mg (1 mg Intravenous Given 03/12/22 0411)  ?  ? ?Procedures  /  Critical Care ?Procedures ? ?ED Course and Medical Decision Making  ?I have reviewed the triage vital signs, the nursing notes, and pertinent available records from the EMR. ? ?Social Determinants Affecting Complexity of Care: ?Patient no clinically significant social determinants affecting this chief complaint.. ? ? ?ED Course: ?  ?Patient  here with sickle cell pain.  Top differential diagnoses include chronic pain, sickle cell vasooclusive crisis. ?Medical Decision Making ?Problems Addressed: ?Sickle cell pain crisis [facility]): acute illness or injury ? ?Amount and/or Complexity of Data Reviewed ?Labs: ordered. ?   Details: HGB down to 6.9 from 7.9 2 days ago.  Remain labs are about baseline. ? ?Risk ?Prescription drug management. ?Decision regarding hospitalization. ? ?  ? ?Consultants: ?I discussed the case with Hospitalist, Dr. Hal Hope, who is appreciated for admitting. ? ? ?Treatment and Plan: ? ? ?Patient's exam and diagnostic results are concerning for sickle cell pain.  Feel that patient will need admission to the hospital for further treatment and evaluation. ? ? ? ?Final Clinical Impressions(s) / ED Diagnoses  ? ?  ICD-10-CM   ?1. Sickle cell pain crisis ([place])  D57.00    ?  ?  ?ED Discharge Orders   ? ? None  ? ?  ?  ? ? ?Discharge Instructions Discussed with and Provided to Patient:  ? ?Discharge Instructions   ?None ?  ? ?  ?Montine Circle, PA-C ?03/12/22 4650 ? ?  ?Quintella Reichert, MD ?03/12/22 [phone] ? ?

## 2022-03-12 NOTE — Progress Notes (Signed)
Upper extremity venous left study completed.   Please see CV Proc for preliminary results.   Nylani Michetti, RDMS, RVT   

## 2022-03-13 DIAGNOSIS — D5700 Hb-SS disease with crisis, unspecified: Principal | ICD-10-CM

## 2022-03-13 LAB — CBC
HCT: 22.7 % — ABNORMAL LOW (ref 36.0–46.0)
Hemoglobin: 7.7 g/dL — ABNORMAL LOW (ref 12.0–15.0)
MCH: 32.2 pg (ref 26.0–34.0)
MCHC: 33.9 g/dL (ref 30.0–36.0)
MCV: 95 fL (ref 80.0–100.0)
Platelets: 473 10*3/uL — ABNORMAL HIGH (ref 150–400)
RBC: 2.39 MIL/uL — ABNORMAL LOW (ref 3.87–5.11)
RDW: 25.4 % — ABNORMAL HIGH (ref 11.5–15.5)
WBC: 14.7 10*3/uL — ABNORMAL HIGH (ref 4.0–10.5)
nRBC: 16.9 % — ABNORMAL HIGH (ref 0.0–0.2)

## 2022-03-13 LAB — RAPID URINE DRUG SCREEN, HOSP PERFORMED
Amphetamines: NOT DETECTED
Barbiturates: NOT DETECTED
Benzodiazepines: NOT DETECTED
Cocaine: POSITIVE — AB
Opiates: POSITIVE — AB
Tetrahydrocannabinol: NOT DETECTED

## 2022-03-13 LAB — CBC WITH DIFFERENTIAL/PLATELET
Abs Immature Granulocytes: 0.28 10*3/uL — ABNORMAL HIGH (ref 0.00–0.07)
Basophils Absolute: 0.1 10*3/uL (ref 0.0–0.1)
Basophils Relative: 0 %
Eosinophils Absolute: 0.1 10*3/uL (ref 0.0–0.5)
Eosinophils Relative: 1 %
HCT: 19.5 % — ABNORMAL LOW (ref 36.0–46.0)
Hemoglobin: 6.6 g/dL — CL (ref 12.0–15.0)
Immature Granulocytes: 2 %
Lymphocytes Relative: 40 %
Lymphs Abs: 6.7 10*3/uL — ABNORMAL HIGH (ref 0.7–4.0)
MCH: 34.4 pg — ABNORMAL HIGH (ref 26.0–34.0)
MCHC: 33.8 g/dL (ref 30.0–36.0)
MCV: 101.6 fL — ABNORMAL HIGH (ref 80.0–100.0)
Monocytes Absolute: 0.9 10*3/uL (ref 0.1–1.0)
Monocytes Relative: 6 %
Neutro Abs: 8.7 10*3/uL — ABNORMAL HIGH (ref 1.7–7.7)
Neutrophils Relative %: 51 %
Platelets: 481 10*3/uL — ABNORMAL HIGH (ref 150–400)
RBC: 1.92 MIL/uL — ABNORMAL LOW (ref 3.87–5.11)
RDW: 23.4 % — ABNORMAL HIGH (ref 11.5–15.5)
WBC: 16.7 10*3/uL — ABNORMAL HIGH (ref 4.0–10.5)
nRBC: 15.8 % — ABNORMAL HIGH (ref 0.0–0.2)

## 2022-03-13 LAB — PREPARE RBC (CROSSMATCH)

## 2022-03-13 LAB — HIV ANTIBODY (ROUTINE TESTING W REFLEX): HIV Screen 4th Generation wRfx: NONREACTIVE

## 2022-03-13 MED ORDER — LORAZEPAM 2 MG/ML IJ SOLN
0.5000 mg | Freq: Four times a day (QID) | INTRAMUSCULAR | Status: DC | PRN
  Administered 2022-03-13 – 2022-03-14 (×3): 0.5 mg via INTRAVENOUS
  Filled 2022-03-13 (×3): qty 1

## 2022-03-13 MED ORDER — SODIUM CHLORIDE 0.9% IV SOLUTION: Freq: Once | INTRAVENOUS | Status: AC

## 2022-03-13 MED ORDER — OXYCODONE HCL 5 MG PO TABS
10.0000 mg | ORAL_TABLET | ORAL | Status: DC | PRN
  Administered 2022-03-13 – 2022-03-15 (×10): 10 mg via ORAL
  Filled 2022-03-13 (×11): qty 2

## 2022-03-13 MED ORDER — ACETAMINOPHEN 325 MG PO TABS
650.0000 mg | ORAL_TABLET | ORAL | Status: DC | PRN
  Administered 2022-03-14 – 2022-03-15 (×3): 650 mg via ORAL
  Filled 2022-03-13 (×3): qty 2

## 2022-03-13 NOTE — Progress Notes (Signed)
Hg 6.6K this AM from 6.9K yesterday.  Admitted with Hg 7.9K.   ? ?1 U PRBCs ordered to be transfused. ?

## 2022-03-13 NOTE — Progress Notes (Signed)
Subjective: ?[name] is a 26 year old female with a medical history significant for sickle cell disease and polysubstance abuse was admitted for sickle cell pain crisis.  ?Patient is very sleepy, yet arousable today. Patient's urine drug screen is positive for cocaine.  Discussed urine drug screen results with patient and discontinuing IV Dilaudid PCA.  Patient became very agitated with care plan and stated "get the fuck out of my room, I want another doctor". She refused to speak further.  ? ?Objective: ? ?Vital signs in last 24 hours: ? ?Vitals:  ? 03/13/22 2052 03/14/22 0422 03/14/22 0918 03/14/22 1318  ?BP: 129/87 124/84 115/67 122/81  ?Pulse: 91 79 91 68  ?Resp: '20 18 14   '$ ?Temp: 98.4 ?F (36.9 ?C) 98.9 ?F (37.2 ?C) 98.5 ?F (36.9 ?C) 97.8 ?F (36.6 ?C)  ?TempSrc: Oral Oral Oral Axillary  ?SpO2: 92% 91% (!) 88% 98%  ?Weight:      ?Height:      ? ? ?Intake/Output from previous day: ? ? ?Intake/Output Summary (Last 24 hours) at 03/14/2022 1425 ?Last data filed at 03/14/2022 2706 ?Gross per 24 hour  ?Intake 148.33 ml  ?Output --  ?Net 148.33 ml  ? ? ?Physical Exam: ?General: Alert, awake, oriented x3, in acute distress HEENT: [place]/AT PEERL, EOMI ?Neck: Trachea midline,  no masses, no thyromegal,y no JVD, no carotid bruit ?OROPHARYNX:  Moist, No exudate/ erythema/lesions.  ?Heart: Regular rate and rhythm, without murmurs, rubs, gallops, PMI non-displaced, no heaves or thrills on palpation.  ?Lungs: Clear to auscultation, no wheezing or rhonchi noted. No increased vocal fremitus resonant to percussion  ?Abdomen: Soft, nontender, nondistended, positive bowel sounds, no masses no hepatosplenomegaly noted.Marland Kitchen  ?Neuro: No focal neurological deficits noted cranial nerves II through XII grossly intact. DTRs 2+ bilaterally upper and lower extremities. Strength 5 out of 5 in bilateral upper and lower extremities. ?Musculoskeletal: No warm swelling or erythema around joints, no spinal tenderness noted. ?Psychiatric:  Lethargic, tearful affect ?Lymph node survey: No cervical axillary or inguinal lymphadenopathy noted. ? ?Lab Results: ? ?Basic Metabolic Panel: ?   ?Component Value Date/Time  ? NA 138 03/12/2022 0205  ? K 3.6 03/12/2022 0205  ? CL 106 03/12/2022 0205  ? CO2 22 03/12/2022 0205  ? BUN 6 03/12/2022 0205  ? CREATININE 0.59 03/12/2022 0205  ? GLUCOSE 80 03/12/2022 0205  ? CALCIUM 8.7 (L) 03/12/2022 0205  ? ?CBC: ?   ?Component Value Date/Time  ? WBC 14.7 (H) 03/13/2022 1254  ? HGB 7.7 (L) 03/13/2022 1254  ? HCT 22.7 (L) 03/13/2022 1254  ? PLT 473 (H) 03/13/2022 1254  ? MCV 95.0 03/13/2022 1254  ? NEUTROABS 8.7 (H) 03/13/2022 0527  ? LYMPHSABS 6.7 (H) 03/13/2022 0527  ? MONOABS 0.9 03/13/2022 0527  ? EOSABS 0.1 03/13/2022 0527  ? BASOSABS 0.1 03/13/2022 0527  ? ? ?Recent Results (from the past 240 hour(s))  ?Urine Culture     Status: None  ? Collection Time: 03/10/22 12:54 PM  ? Specimen: Urine, Clean Catch  ?Result Value Ref Range Status  ? Specimen Description URINE, CLEAN CATCH  Final  ? Special Requests NONE  Final  ? Culture   Final  ?  NO GROWTH ?Performed at [facility], [place] [address], [place],  [address] ?  ? Report Status 03/11/2022 FINAL  Final  ? ? ?Studies/Results: ?DG Humerus Left ? ?Result Date: 03/12/2022 ?CLINICAL DATA:  Sickle cell crisis, mid humeral pain for 3 days EXAM: LEFT HUMERUS - 2+ VIEW  COMPARISON:  None Available. FINDINGS: Frontal and lateral views of the left humerus are obtained. No acute or destructive bony lesions. No evidence of bone infarct. Alignment of the left shoulder and elbow is anatomic. Soft tissues are normal. IMPRESSION: 1. Unremarkable left humerus. Electronically Signed   By: Randa Ngo M.D.   On: 03/12/2022 15:49  ? ?VAS [place] UPPER EXTREMITY VENOUS DUPLEX ? ?Result Date: 03/12/2022 ?UPPER VENOUS STUDY  [name]  Date of Exam:   03/12/2022 Medical Rec #: [id number]              Accession #:    [id number] Date of Birth: [birth year]               Patient Gender: F Patient Age:   25 years Exam Location:  [facility] Procedure:      VAS [place] UPPER EXTREMITY VENOUS DUPLEX Referring Phys: Gean Birchwood --------------------------------------------------------------------------------  Indications: Pain Risk Factors: Sickle cell anemia. Comparison Study: No prior studies. Performing Technologist: Darlin Coco RDMS, RVT  Examination Guidelines: A complete evaluation includes B-mode imaging, spectral Doppler, color Doppler, and power Doppler as needed of all accessible portions of each vessel. Bilateral testing is considered an integral part of a complete examination. Limited examinations for reoccurring indications may be performed as noted.  Right Findings: +----------+------------+---------+-----------+----------+-------+ RIGHT     CompressiblePhasicitySpontaneousPropertiesSummary +----------+------------+---------+-----------+----------+-------+ Subclavian               Yes       Yes                      +----------+------------+---------+-----------+----------+-------+  Left Findings: +----------+------------+---------+-----------+----------+-------+ LEFT      CompressiblePhasicitySpontaneousPropertiesSummary +----------+------------+---------+-----------+----------+-------+ IJV           Full       Yes       Yes                      +----------+------------+---------+-----------+----------+-------+ Subclavian               Yes       Yes                      +----------+------------+---------+-----------+----------+-------+ Axillary      Full       Yes       Yes                      +----------+------------+---------+-----------+----------+-------+ Brachial      Full                                          +----------+------------+---------+-----------+----------+-------+ Radial        Full                                           +----------+------------+---------+-----------+----------+-------+ Ulnar         Full                                          +----------+------------+---------+-----------+----------+-------+ Cephalic      Full                                          +----------+------------+---------+-----------+----------+-------+  Basilic       Full                                          +----------+------------+---------+-----------+----------+-------+  Summary:  Right: No evidence of thrombosis in the subclavian.  Left: No evidence of deep vein thrombosis in the upper extremity. No evidence of superficial vein thrombosis in the upper extremity.  *See table(s) above for measurements and observations.  Diagnosing physician: Monica Martinez MD Electronically signed by Monica Martinez MD on 03/12/2022 at 5:34:27 PM.    Final    ? ?Medications: ?Scheduled Meds: ? enoxaparin (LOVENOX) injection  40 mg Subcutaneous Q24H  ? mouth rinse  15 mL Mouth Rinse BID  ? senna-docusate  1 tablet Oral BID  ? ?Continuous Infusions: ?PRN Meds:.acetaminophen, diphenhydrAMINE, HYDROmorphone (DILAUDID) injection, LORazepam, naloxone **AND** sodium chloride flush, ondansetron (ZOFRAN) IV, oxyCODONE, polyethylene glycol ? ?Consultants: ?None ? ?Procedures: ?None ? ?Antibiotics: ?None ? ?Assessment/Plan: ?Principal Problem: ?  Sickle cell pain crisis ([place]) ?Active Problems: ?  Tobacco abuse ?  Anemia of chronic disease ? ? ?Sickle cell disease with pain crisis:  ?Discontinue IV Dilaudid PCA.  Patient has positive UDS. ?Oxycodone 10 mg every 4 hours as needed for severe pain. ?Decrease IV fluids to [facility] ?Toradol 15 mg every 6 hours ?Monitor vital signs very closely, reevaluate pain scale regularly, and supplemental oxygen as needed. ? ?Anemia of chronic disease: ?Today, patient's hemoglobin is 6.6 g/dL.  She was transfused 1 unit PRBCs.  Repeat labs in AM. ? ?Cocaine abuse: ?UDS is positive for cocaine.  Patient denies frequent  cocaine use.  She is refusing any assistance with this matter. ?Patient appears to be agitated.  Ativan 0.5 mg every 6 hours as needed. ? ?Leukocytosis: ?WBC 16.7.  Patient remains afebrile.  No signs of acute infection.  Continue to monitor closely without antibiotics. ? ?Thrombocytosis: ?Platelets elevated at 481,000, secondary to sickle

## 2022-03-13 NOTE — Progress Notes (Signed)
Date and time results received: 03/13/22 0601 ? ?Reported UP:[facility], Lab ?Reported to: Grayce Sessions, RN ? ? ?Test: Hemoglobin  ?Critical Value: 6.6 ? ?Name of Provider Notified: Aileen Fass ? ?Orders Received? Or Actions Taken?: MD notified, awaiting orders  ?

## 2022-03-13 NOTE — Progress Notes (Signed)
Room began to smell more like cigarette smoke at 2345. Pt and significant other at bedside state they are not smoking. Saw lighter and cigarettes openly on top of pt's purse. Lighter removed and placed in pt's chart. Let pt know she can retrieve at discharge.  ?

## 2022-03-14 DIAGNOSIS — D5700 Hb-SS disease with crisis, unspecified: Secondary | ICD-10-CM | POA: Diagnosis not present

## 2022-03-14 LAB — TYPE AND SCREEN
ABO/RH(D): B POS
Antibody Screen: NEGATIVE
Unit division: 0

## 2022-03-14 LAB — BPAM RBC
Blood Product Expiration Date: 202306102359
ISSUE DATE / TIME: 202305150820
Unit Type and Rh: 5100

## 2022-03-14 MED ORDER — HYDROMORPHONE HCL 2 MG/ML IJ SOLN
2.0000 mg | INTRAMUSCULAR | Status: AC | PRN
  Administered 2022-03-14 – 2022-03-15 (×3): 2 mg via SUBCUTANEOUS
  Filled 2022-03-14 (×3): qty 1

## 2022-03-14 NOTE — Progress Notes (Signed)
?  Transition of Care (TOC) Screening Note ? ? ?Patient Details  ?Name: [name] ?Date of Birth: 05-Jul-1996 ? ? ?Transition of Care (TOC) CM/SW Contact:    ?Sharonne Ricketts, Marjie Skiff, RN ?Phone Number: ?03/14/2022, 2:52 PM ? ? ? ?Transition of Care Department [facility]) has reviewed patient and no TOC needs have been identified at this time. We will continue to monitor patient advancement through interdisciplinary progression rounds. If new patient transition needs arise, please place a TOC consult. ?  ?

## 2022-03-14 NOTE — Progress Notes (Signed)
?   03/14/22 0918  ?Vitals  ?Temp 98.5 ?F (36.9 ?C)  ?Temp Source Oral  ?BP 115/67  ?MAP (mmHg) 81  ?BP Location Right Arm  ?BP Method Automatic  ?Patient Position (if appropriate) Lying  ?Pulse Rate 91  ?Pulse Rate Source Monitor  ?Resp 14  ?MEWS COLOR  ?MEWS Score Color Green  ?Oxygen Therapy  ?SpO2 (!) 88 %  ?O2 Device Room Air  ?MEWS Score  ?MEWS Temp 0  ?MEWS Systolic 0  ?MEWS Pulse 0  ?MEWS RR 0  ?MEWS LOC 0  ?MEWS Score 0  ? ? ?Patient refused to keep oxygen on via nasal cannula.  MD notified and patient educated.  Patient continues to refuse. ?

## 2022-03-14 NOTE — Progress Notes (Signed)
Subjective: ?Lateria Alderman is a 26 year old female with a medical history significant for sickle cell disease and polysubstance abuse was admitted for sickle cell pain crisis.  ?Today, patient is writing in pain and crying. She says that pain is mostly to left upper extremity.  Left upper extremity x-ray is negative for any acute abnormalities. ?She denies dizziness, headache, shortness of breath, or chest pain. ?Objective: ? ?Vital signs in last 24 hours: ? ?Vitals:  ? 03/13/22 2052 03/14/22 0422 03/14/22 0918 03/14/22 1318  ?BP: 129/87 124/84 115/67 122/81  ?Pulse: 91 79 91 68  ?Resp: '20 18 14   '$ ?Temp: 98.4 ?F (36.9 ?C) 98.9 ?F (37.2 ?C) 98.5 ?F (36.9 ?C) 97.8 ?F (36.6 ?C)  ?TempSrc: Oral Oral Oral Axillary  ?SpO2: 92% 91% (!) 88% 98%  ?Weight:      ?Height:      ? ? ?Intake/Output from previous day: ? ? ?Intake/Output Summary (Last 24 hours) at 03/14/2022 1821 ?Last data filed at 03/14/2022 3419 ?Gross per 24 hour  ?Intake 120 ml  ?Output --  ?Net 120 ml  ? ? ?Physical Exam: ?General: Alert, awake, oriented x3, in acute distress HEENT: [place]/AT PEERL, EOMI ?Neck: Trachea midline,  no masses, no thyromegal,y no JVD, no carotid bruit ?OROPHARYNX:  Moist, No exudate/ erythema/lesions.  ?Heart: Regular rate and rhythm, without murmurs, rubs, gallops, PMI non-displaced, no heaves or thrills on palpation.  ?Lungs: Clear to auscultation, no wheezing or rhonchi noted. No increased vocal fremitus resonant to percussion  ?Abdomen: Soft, nontender, nondistended, positive bowel sounds, no masses no hepatosplenomegaly noted.Marland Kitchen  ?Neuro: No focal neurological deficits noted cranial nerves II through XII grossly intact. DTRs 2+ bilaterally upper and lower extremities. Strength 5 out of 5 in bilateral upper and lower extremities. ?Musculoskeletal: No warm swelling or erythema around joints, no spinal tenderness noted. ?Psychiatric: Lethargic, tearful affect ?Lymph node survey: No cervical axillary or inguinal lymphadenopathy  noted. ? ?Lab Results: ? ?Basic Metabolic Panel: ?   ?Component Value Date/Time  ? NA 138 03/12/2022 0205  ? K 3.6 03/12/2022 0205  ? CL 106 03/12/2022 0205  ? CO2 22 03/12/2022 0205  ? BUN 6 03/12/2022 0205  ? CREATININE 0.59 03/12/2022 0205  ? GLUCOSE 80 03/12/2022 0205  ? CALCIUM 8.7 (L) 03/12/2022 0205  ? ?CBC: ?   ?Component Value Date/Time  ? WBC 14.7 (H) 03/13/2022 1254  ? HGB 7.7 (L) 03/13/2022 1254  ? HCT 22.7 (L) 03/13/2022 1254  ? PLT 473 (H) 03/13/2022 1254  ? MCV 95.0 03/13/2022 1254  ? NEUTROABS 8.7 (H) 03/13/2022 0527  ? LYMPHSABS 6.7 (H) 03/13/2022 0527  ? MONOABS 0.9 03/13/2022 0527  ? EOSABS 0.1 03/13/2022 0527  ? BASOSABS 0.1 03/13/2022 0527  ? ? ?Recent Results (from the past 240 hour(s))  ?Urine Culture     Status: None  ? Collection Time: 03/10/22 12:54 PM  ? Specimen: Urine, Clean Catch  ?Result Value Ref Range Status  ? Specimen Description URINE, CLEAN CATCH  Final  ? Special Requests NONE  Final  ? Culture   Final  ?  NO GROWTH ?Performed at [facility], [place] [address] [place], [place] [address] ?  ? Report Status 03/11/2022 FINAL  Final  ? ? ?Studies/Results: ?No results found. ? ?Medications: ?Scheduled Meds: ? enoxaparin (LOVENOX) injection  40 mg Subcutaneous Q24H  ? mouth rinse  15 mL Mouth Rinse BID  ? senna-docusate  1 tablet Oral BID  ? ?Continuous Infusions: ?PRN Meds:.acetaminophen, diphenhydrAMINE, HYDROmorphone (  DILAUDID) injection, LORazepam, naloxone **AND** sodium chloride flush, ondansetron (ZOFRAN) IV, oxyCODONE, polyethylene glycol ? ?Consultants: ?None ? ?Procedures: ?None ? ?Antibiotics: ?None ? ?Assessment/Plan: ?Principal Problem: ?  Sickle cell pain crisis ([place]) ?Active Problems: ?  Tobacco abuse ?  Leukocytosis ?  Anemia of chronic disease ?  Cocaine abuse ([place]) ?  Cocaine-induced mood disorder ([place]) ? ? ?Sickle cell disease with pain crisis:  ?Oxycodone 10 mg every 4 hours as needed for severe pain. ?Dilaudid 2 mg SQ every 3 hours as needed ?Decrease IV  fluids to KVO ?Toradol 15 mg every 6 hours ?Monitor vital signs very closely, reevaluate pain scale regularly, and supplemental oxygen as needed. ? ?Anemia of chronic disease: ?Patient's hemoglobin improved to 7.7 g/dL following blood transfusion.  Patient has refused labs today.  Will attempt to obtain labs at a later time. ? ?Cocaine abuse: ?UDS is positive for cocaine.  Patient denies frequent cocaine use.  She is refusing any assistance with this matter. ? Ativan 0.5 mg every 6 hours as needed. ? ?Leukocytosis: ?WBC 16.7.  Patient remains afebrile.  No signs of acute infection.  Continue to monitor closely without antibiotics. ? ?Thrombocytosis: ?Platelets elevated on yesterday secondary to sickle cell crisis.  Patient is refusing labs today continue to monitor closely. ? ?Code Status: Full Code ?Family Communication: N/A ?Disposition Plan: Not yet ready for discharge ? ?Donia Pounds  APRN, MSN, FNP-C ?Patient [place] ?[facility] ?[address]  ?[place], [place] [address] ?[phone] ? ?If 7PM-7AM, please contact night-coverage. ? ?03/14/2022, 6:21 PM  LOS: 2 days  ? ?

## 2022-03-15 DIAGNOSIS — D5700 Hb-SS disease with crisis, unspecified: Secondary | ICD-10-CM | POA: Diagnosis not present

## 2022-03-15 LAB — CBC WITH DIFFERENTIAL/PLATELET
Abs Immature Granulocytes: 0.09 10*3/uL — ABNORMAL HIGH (ref 0.00–0.07)
Basophils Absolute: 0 10*3/uL (ref 0.0–0.1)
Basophils Relative: 0 %
Eosinophils Absolute: 0.1 10*3/uL (ref 0.0–0.5)
Eosinophils Relative: 1 %
HCT: 23.2 % — ABNORMAL LOW (ref 36.0–46.0)
Hemoglobin: 8.1 g/dL — ABNORMAL LOW (ref 12.0–15.0)
Immature Granulocytes: 1 %
Lymphocytes Relative: 34 %
Lymphs Abs: 4.6 10*3/uL — ABNORMAL HIGH (ref 0.7–4.0)
MCH: 32.9 pg (ref 26.0–34.0)
MCHC: 34.9 g/dL (ref 30.0–36.0)
MCV: 94.3 fL (ref 80.0–100.0)
Monocytes Absolute: 0.7 10*3/uL (ref 0.1–1.0)
Monocytes Relative: 5 %
Neutro Abs: 8.2 10*3/uL — ABNORMAL HIGH (ref 1.7–7.7)
Neutrophils Relative %: 59 %
Platelets: 490 10*3/uL — ABNORMAL HIGH (ref 150–400)
RBC: 2.46 MIL/uL — ABNORMAL LOW (ref 3.87–5.11)
RDW: 25.7 % — ABNORMAL HIGH (ref 11.5–15.5)
WBC: 13.8 10*3/uL — ABNORMAL HIGH (ref 4.0–10.5)
nRBC: 7.7 % — ABNORMAL HIGH (ref 0.0–0.2)

## 2022-03-15 NOTE — Progress Notes (Signed)
Subjective: [name] is a 26 year old female with a medical history significant for sickle cell disease and polysubstance abuse was admitted for sickle cell pain crisis. Patient says that pain intensity is 7/10 and she is not able to function at home at this time.   She denies headache, chest pain, shortness of breath, nausea, vomiting, or diarrhea.  Objective:  Vital signs in last 24 hours:  Vitals:   03/14/22 2050 03/15/22 0441 03/15/22 1019 03/15/22 1601  BP: 121/69 124/72    Pulse: 81 68    Resp: 16 18    Temp: 98.8 F (37.1 C) 97.7 F (36.5 C) 98.2 F (36.8 C) 97.7 F (36.5 C)  TempSrc: Oral Oral Oral Oral  SpO2: 92% 98%    Weight:      Height:        Intake/Output from previous day:   Intake/Output Summary (Last 24 hours) at 03/15/2022 1710 Last data filed at 03/15/2022 1019 Gross per 24 hour  Intake 480 ml  Output --  Net 480 ml    Physical Exam: General: Alert, awake, oriented x3, in acute distress HEENT: [place]/AT PEERL, EOMI Neck: Trachea midline,  no masses, no thyromegal,y no JVD, no carotid bruit OROPHARYNX:  Moist, No exudate/ erythema/lesions.  Heart: Regular rate and rhythm, without murmurs, rubs, gallops, PMI non-displaced, no heaves or thrills on palpation.  Lungs: Clear to auscultation, no wheezing or rhonchi noted. No increased vocal fremitus resonant to percussion  Abdomen: Soft, nontender, nondistended, positive bowel sounds, no masses no hepatosplenomegaly noted..  Neuro: No focal neurological deficits noted cranial nerves II through XII grossly intact. DTRs 2+ bilaterally upper and lower extremities. Strength 5 out of 5 in bilateral upper and lower extremities. Musculoskeletal: No warm swelling or erythema around joints, no spinal tenderness noted. Psychiatric: Lethargic, tearful affect Lymph node survey: No cervical axillary or inguinal lymphadenopathy noted.  Lab Results:  Basic Metabolic Panel:    Component Value Date/Time   NA 138  03/12/2022 0205   K 3.6 03/12/2022 0205   CL 106 03/12/2022 0205   CO2 22 03/12/2022 0205   BUN 6 03/12/2022 0205   CREATININE 0.59 03/12/2022 0205   GLUCOSE 80 03/12/2022 0205   CALCIUM 8.7 (L) 03/12/2022 0205   CBC:    Component Value Date/Time   WBC 13.8 (H) 03/15/2022 0559   HGB 8.1 (L) 03/15/2022 0559   HCT 23.2 (L) 03/15/2022 0559   PLT 490 (H) 03/15/2022 0559   MCV 94.3 03/15/2022 0559   NEUTROABS 8.2 (H) 03/15/2022 0559   LYMPHSABS 4.6 (H) 03/15/2022 0559   MONOABS 0.7 03/15/2022 0559   EOSABS 0.1 03/15/2022 0559   BASOSABS 0.0 03/15/2022 0559    Recent Results (from the past 240 hour(s))  Urine Culture     Status: None   Collection Time: 03/10/22 12:54 PM   Specimen: Urine, Clean Catch  Result Value Ref Range Status   Specimen Description URINE, CLEAN CATCH  Final   Special Requests NONE  Final   Culture   Final    NO GROWTH Performed at [facility], [address], [place], [place] [address]    Report Status 03/11/2022 FINAL  Final    Studies/Results: No results found.  Medications: Scheduled Meds:  enoxaparin (LOVENOX) injection  40 mg Subcutaneous Q24H   mouth rinse  15 mL Mouth Rinse BID   senna-docusate  1 tablet Oral BID   Continuous Infusions: PRN Meds:.acetaminophen, diphenhydrAMINE, LORazepam, naloxone **AND** sodium chloride flush, ondansetron (ZOFRAN)  IV, oxyCODONE, polyethylene glycol  Consultants: None  Procedures: None  Antibiotics: None  Assessment/Plan: Principal Problem:   Sickle cell pain crisis ([facility]) Active Problems:   Tobacco abuse   Leukocytosis   Anemia of chronic disease   Cocaine abuse ([facility])   Cocaine-induced mood disorder ([facility])   Sickle cell disease with pain crisis:  Oxycodone 10 mg every 4 hours as needed for severe pain. Dilaudid 2 mg SQ every 3 hours as needed Toradol 15 mg every 6 hours Monitor vital signs very closely, reevaluate pain scale regularly, and supplemental oxygen as needed.  Anemia  of chronic disease: Patient's hemoglobin improved and has returned to baseline. Patient is s/p 1 unit of PRBCs. Follow labs in am.     Cocaine abuse: UDS is positive for cocaine.  Patient denies frequent cocaine use.  She is refusing any assistance with this matter.  Ativan 0.5 mg every 6 hours as needed.  Leukocytosis: Stable.  Patient remains afebrile.  No signs of acute infection.  Continue to monitor closely without antibiotics.  Thrombocytosis: Stable. Secondary to sickle cell crisis.   Code Status: Full Code Family Communication: N/A Disposition Plan: Not yet ready for discharge  [place], MSN, FNP-C Patient [facility] [address] [place], [place] [address] [phone]  If 7PM-7AM, please contact night-coverage.  03/15/2022, 5:10 PM  LOS: 3 days

## 2022-03-16 DIAGNOSIS — D5700 Hb-SS disease with crisis, unspecified: Secondary | ICD-10-CM | POA: Diagnosis not present

## 2022-03-16 NOTE — Progress Notes (Signed)
Patient stated that she was ready to go home and did not want to wait for me to message Cammie Sickle, NP. Patient left floor ambulating with significant other. Patient alert and oriented. AMA paper signed.

## 2022-03-21 NOTE — Discharge Summary (Signed)
Physician Discharge Summary  [name] MEQ:[id number] DOB: 11-Oct-1996 DOA: 03/12/2022  PCP: Dorena Dew, FNP  Admit date: 03/12/2022  Discharge date: 03/16/2022  Discharge Diagnoses:  Principal Problem:   Sickle cell pain crisis ([place]) Active Problems:   Tobacco abuse   Leukocytosis   Anemia of chronic disease   Cocaine abuse ([place])   Cocaine-induced mood disorder ([place])   Discharge Condition: Stable  Disposition:  Left AGAINST MEDICAL ADVICE Diet: Regular Wt Readings from Last 3 Encounters:  03/12/22 53.1 kg  11/18/21 63.5 kg  02/07/21 61 kg    History of present illness:  [name] is a 26 year old female with a medical history significant for sickle cell disease, anemia of chronic disease, and polysubstance abuse presents to the ER because of worsening pain in left upper extremity over the last several days.  Patient states that this is typical of her sickle cell pain crisis.  Denies any trauma, fever, or chills. ER course: In the ER, on exam patient has good pulses in the left upper extremity.  Movements are restricted due to pain.  CT of head is unremarkable.  Patient admitted for sickle cell pain crisis.  Hemoglobin is around 6.9.  Hospital Course:  Sickle cell disease with pain crisis: Patient was admitted for sickle cell pain crisis and managed appropriately with IVF, IV Dilaudid via PCA and IV Toradol, as well as other adjunct therapies per sickle cell pain management protocols.  Pain has been primarily to left upper extremity.  Reviewed left extremity x-ray, unremarkable.  Anemia of chronic disease: Patient's hemoglobin decreased to 6.7 g/dL.  She is status post 1 unit PRBCs.  Prior to leaving [place], hemoglobin was 8.1 g/dL.  Polysubstance abuse: Reviewed urine drug screen, positive for cocaine.  Patient was counseled at length.  Declines any assistance with drug rehabilitation prior to leaving [place].    Discharge Exam: Vitals:   03/15/22 2227 03/16/22 0456  BP: 107/64 (!) 97/54  Pulse: (!) 105 82  Resp: 18 18  Temp: 98.6 F (37 C) 98 F (36.7 C)  SpO2: 97% 95%   Vitals:   03/15/22 1019 03/15/22 1601 03/15/22 2227 03/16/22 0456  BP:   107/64 (!) 97/54  Pulse:   (!) 105 82  Resp:   18 18  Temp: 98.2 F (36.8 C) 97.7 F (36.5 C) 98.6 F (37 C) 98 F (36.7 C)  TempSrc: Oral Oral Oral Oral  SpO2:   97% 95%  Weight:      Height:       Discharge Instructions   Allergies as of 03/16/2022   No Known Allergies      Medication List     ASK your doctor about these medications    ibuprofen 200 MG tablet Commonly known as: ADVIL Take 200 mg by mouth every 8 (eight) hours as needed for moderate pain.   ondansetron 4 MG tablet Commonly known as: ZOFRAN Take 1 tablet (4 mg total) by mouth every 6 (six) hours.        The results of significant diagnostics from this hospitalization (including imaging, microbiology, ancillary and laboratory) are listed below for reference.    Significant Diagnostic Studies: DG Chest 1 View  Result Date: 03/10/2022 CLINICAL DATA:  Sickle cell crisis. EXAM: CHEST  1 VIEW COMPARISON:  Chest x-ray 02/07/2021 FINDINGS: The heart is within normal limits in size given the AP projection. The mediastinal and hilar contours are normal. The lungs are clear. No infiltrates or  effusions. The bony thorax is intact. IMPRESSION: No acute cardiopulmonary findings. Electronically Signed   By: Marijo Sanes M.D.   On: 03/10/2022 13:09   CT HEAD WO CONTRAST (5MM)  Result Date: 03/12/2022 CLINICAL DATA:  Sickle cell crisis. Neuro deficit. Stroke suspected. EXAM: CT HEAD WITHOUT CONTRAST TECHNIQUE: Contiguous axial images were obtained from the base of the skull through the vertex without intravenous contrast. RADIATION DOSE REDUCTION: This exam was performed according to the departmental dose-optimization program which includes automated exposure  control, adjustment of the mA and/or kV according to patient size and/or use of iterative reconstruction technique. COMPARISON:  06/08/2019 FINDINGS: Brain: No evidence of acute infarction, hemorrhage, hydrocephalus, extra-axial collection or mass lesion/mass effect. Vascular: No hyperdense vessel or unexpected calcification. Skull: Normal. Negative for fracture or focal lesion. Sinuses/Orbits: No acute finding. Other: None. IMPRESSION: No acute intracranial abnormalities. Normal appearance of the brain. Electronically Signed   By: Kerby Moors M.D.   On: 03/12/2022 05:31   DG Humerus Left  Result Date: 03/12/2022 CLINICAL DATA:  Sickle cell crisis, mid humeral pain for 3 days EXAM: LEFT HUMERUS - 2+ VIEW COMPARISON:  None Available. FINDINGS: Frontal and lateral views of the left humerus are obtained. No acute or destructive bony lesions. No evidence of bone infarct. Alignment of the left shoulder and elbow is anatomic. Soft tissues are normal. IMPRESSION: 1. Unremarkable left humerus. Electronically Signed   By: Randa Ngo M.D.   On: 03/12/2022 15:49   VAS [place] UPPER EXTREMITY VENOUS DUPLEX  Result Date: 03/12/2022 UPPER VENOUS STUDY  Patient Name:  [name]  Date of Exam:   03/12/2022 Medical Rec #: [id number]              Accession #:    [id number] Date of Birth: 1995-11-10              Patient Gender: F Patient Age:   26 years Exam Location:  [facility] Procedure:      VAS [place] UPPER EXTREMITY VENOUS DUPLEX Referring Phys: Gean Birchwood --------------------------------------------------------------------------------  Indications: Pain Risk Factors: Sickle cell anemia. Comparison Study: No prior studies. Performing Technologist: Darlin Coco RDMS, RVT  Examination Guidelines: A complete evaluation includes B-mode imaging, spectral Doppler, color Doppler, and power Doppler as needed of all accessible portions of each vessel. Bilateral testing is considered an integral part of a  complete examination. Limited examinations for reoccurring indications may be performed as noted.  Right Findings: +----------+------------+---------+-----------+----------+-------+ RIGHT     CompressiblePhasicitySpontaneousPropertiesSummary +----------+------------+---------+-----------+----------+-------+ Subclavian               Yes       Yes                      +----------+------------+---------+-----------+----------+-------+  Left Findings: +----------+------------+---------+-----------+----------+-------+ LEFT      CompressiblePhasicitySpontaneousPropertiesSummary +----------+------------+---------+-----------+----------+-------+ IJV           Full       Yes       Yes                      +----------+------------+---------+-----------+----------+-------+ Subclavian               Yes       Yes                      +----------+------------+---------+-----------+----------+-------+ Axillary      Full       Yes  Yes                      +----------+------------+---------+-----------+----------+-------+ Brachial      Full                                          +----------+------------+---------+-----------+----------+-------+ Radial        Full                                          +----------+------------+---------+-----------+----------+-------+ Ulnar         Full                                          +----------+------------+---------+-----------+----------+-------+ Cephalic      Full                                          +----------+------------+---------+-----------+----------+-------+ Basilic       Full                                          +----------+------------+---------+-----------+----------+-------+  Summary:  Right: No evidence of thrombosis in the subclavian.  Left: No evidence of deep vein thrombosis in the upper extremity. No evidence of superficial vein thrombosis in the upper extremity.  *See  table(s) above for measurements and observations.  Diagnosing physician: Monica Martinez MD Electronically signed by Monica Martinez MD on 03/12/2022 at 5:34:27 PM.    Final     Microbiology: No results found for this or any previous visit (from the past 240 hour(s)).   Labs: Basic Metabolic Panel: No results for input(s): NA, K, CL, CO2, GLUCOSE, BUN, CREATININE, CALCIUM, MG, PHOS in the last 168 hours. Liver Function Tests: No results for input(s): AST, ALT, ALKPHOS, BILITOT, PROT, ALBUMIN in the last 168 hours. No results for input(s): LIPASE, AMYLASE in the last 168 hours. No results for input(s): AMMONIA in the last 168 hours. CBC: Recent Labs  Lab 03/15/22 0559  WBC 13.8*  NEUTROABS 8.2*  HGB 8.1*  HCT 23.2*  MCV 94.3  PLT 490*   Cardiac Enzymes: No results for input(s): CKTOTAL, CKMB, CKMBINDEX, TROPONINI in the last 168 hours. BNP: Invalid input(s): POCBNP CBG: No results for input(s): GLUCAP in the last 168 hours.   Signed:  Donia Pounds  APRN, MSN, FNP-C Patient [facility] [address] [place], [place] [address] [phone]  [facility] 03/21/2022, 4:57 PM

## 2022-11-17 ENCOUNTER — Other Ambulatory Visit: Payer: Self-pay

## 2022-11-17 ENCOUNTER — Emergency Department (HOSPITAL_COMMUNITY)
Admission: EM | Admit: 2022-11-17 | Discharge: 2022-11-17 | Disposition: A | Discharge status: Home / Self Care | Payer: No Typology Code available for payment source | Attending: Student | Admitting: Student

## 2022-11-17 ENCOUNTER — Emergency Department (HOSPITAL_COMMUNITY): Payer: No Typology Code available for payment source

## 2022-11-17 ENCOUNTER — Encounter (HOSPITAL_COMMUNITY): Payer: Self-pay | Admitting: *Deleted

## 2022-11-17 DIAGNOSIS — J45909 Unspecified asthma, uncomplicated: Secondary | ICD-10-CM | POA: Diagnosis not present

## 2022-11-17 DIAGNOSIS — Z3A00 Weeks of gestation of pregnancy not specified: Secondary | ICD-10-CM | POA: Insufficient documentation

## 2022-11-17 DIAGNOSIS — D5700 Hb-SS disease with crisis, unspecified: Secondary | ICD-10-CM | POA: Insufficient documentation

## 2022-11-17 DIAGNOSIS — M79604 Pain in right leg: Secondary | ICD-10-CM

## 2022-11-17 DIAGNOSIS — Z3201 Encounter for pregnancy test, result positive: Secondary | ICD-10-CM

## 2022-11-17 DIAGNOSIS — R1084 Generalized abdominal pain: Secondary | ICD-10-CM

## 2022-11-17 DIAGNOSIS — O26899 Other specified pregnancy related conditions, unspecified trimester: Secondary | ICD-10-CM | POA: Diagnosis present

## 2022-11-17 LAB — CBC WITH DIFFERENTIAL/PLATELET
Abs Immature Granulocytes: 0.07 10*3/uL (ref 0.00–0.07)
Basophils Absolute: 0 10*3/uL (ref 0.0–0.1)
Basophils Relative: 0 %
Eosinophils Absolute: 0 10*3/uL (ref 0.0–0.5)
Eosinophils Relative: 0 %
HCT: 25.7 % — ABNORMAL LOW (ref 36.0–46.0)
Hemoglobin: 9.2 g/dL — ABNORMAL LOW (ref 12.0–15.0)
Immature Granulocytes: 1 %
Lymphocytes Relative: 11 %
Lymphs Abs: 1.5 10*3/uL (ref 0.7–4.0)
MCH: 35.2 pg — ABNORMAL HIGH (ref 26.0–34.0)
MCHC: 35.8 g/dL (ref 30.0–36.0)
MCV: 98.5 fL (ref 80.0–100.0)
Monocytes Absolute: 0.6 10*3/uL (ref 0.1–1.0)
Monocytes Relative: 4 %
Neutro Abs: 12 10*3/uL — ABNORMAL HIGH (ref 1.7–7.7)
Neutrophils Relative %: 84 %
Platelets: 601 10*3/uL — ABNORMAL HIGH (ref 150–400)
RBC: 2.61 MIL/uL — ABNORMAL LOW (ref 3.87–5.11)
RDW: 17 % — ABNORMAL HIGH (ref 11.5–15.5)
WBC: 14.2 10*3/uL — ABNORMAL HIGH (ref 4.0–10.5)
nRBC: 1.8 % — ABNORMAL HIGH (ref 0.0–0.2)

## 2022-11-17 LAB — I-STAT BETA HCG BLOOD, ED (MC, WL, AP ONLY): I-stat hCG, quantitative: 79.5 m[IU]/mL — ABNORMAL HIGH (ref <5)

## 2022-11-17 LAB — RETICULOCYTES
Immature Retic Fract: 26.2 % — ABNORMAL HIGH (ref 2.3–15.9)
RBC.: 2.57 MIL/uL — ABNORMAL LOW (ref 3.87–5.11)
Retic Count, Absolute: 385.5 10*3/uL — ABNORMAL HIGH (ref 19.0–186.0)
Retic Ct Pct: 14.8 % — ABNORMAL HIGH (ref 0.4–3.1)

## 2022-11-17 LAB — COMPREHENSIVE METABOLIC PANEL
ALT: 34 U/L (ref 0–44)
AST: 39 U/L (ref 15–41)
Albumin: 4.4 g/dL (ref 3.5–5.0)
Alkaline Phosphatase: 57 U/L (ref 38–126)
Anion gap: 7 (ref 5–15)
BUN: 9 mg/dL (ref 6–20)
CO2: 22 mmol/L (ref 22–32)
Calcium: 8.8 mg/dL — ABNORMAL LOW (ref 8.9–10.3)
Chloride: 106 mmol/L (ref 98–111)
Creatinine, Ser: 0.54 mg/dL (ref 0.44–1.00)
GFR, Estimated: >60 mL/min (ref >60)
Glucose, Bld: 115 mg/dL — ABNORMAL HIGH (ref 70–99)
Potassium: 4.1 mmol/L (ref 3.5–5.1)
Sodium: 135 mmol/L (ref 135–145)
Total Bilirubin: 2.7 mg/dL — ABNORMAL HIGH (ref 0.3–1.2)
Total Protein: 8 g/dL (ref 6.5–8.1)

## 2022-11-17 LAB — URINALYSIS, ROUTINE W REFLEX MICROSCOPIC
Bacteria, UA: NONE SEEN
Bilirubin Urine: NEGATIVE
Glucose, UA: NEGATIVE mg/dL
Hgb urine dipstick: NEGATIVE
Ketones, ur: NEGATIVE mg/dL
Nitrite: NEGATIVE
Protein, ur: NEGATIVE mg/dL
Specific Gravity, Urine: 1.009 (ref 1.005–1.030)
pH: 7 (ref 5.0–8.0)

## 2022-11-17 LAB — PREGNANCY, URINE: Preg Test, Ur: NEGATIVE

## 2022-11-17 LAB — HCG, QUANTITATIVE, PREGNANCY: hCG, Beta Chain, Quant, S: 89 m[IU]/mL — ABNORMAL HIGH (ref <5)

## 2022-11-17 LAB — LIPASE, BLOOD: Lipase: 30 U/L (ref 11–51)

## 2022-11-17 MED ORDER — HYDROMORPHONE HCL 1 MG/ML IJ SOLN
1.0000 mg | INTRAMUSCULAR | Status: AC
  Filled 2022-11-17: qty 1

## 2022-11-17 MED ORDER — SODIUM CHLORIDE 0.9 % IV SOLN
12.5000 mg | Freq: Once | INTRAVENOUS | Status: AC
  Administered 2022-11-17: 12.5 mg via INTRAVENOUS
  Filled 2022-11-17: qty 12.5

## 2022-11-17 MED ORDER — HYDROMORPHONE HCL 1 MG/ML IJ SOLN
0.5000 mg | INTRAMUSCULAR | Status: AC
  Administered 2022-11-17: 0.5 mg via INTRAVENOUS
  Filled 2022-11-17: qty 1

## 2022-11-17 MED ORDER — HYDROMORPHONE HCL 1 MG/ML IJ SOLN: 0.5000 mg | INTRAMUSCULAR | Status: DC

## 2022-11-17 MED ORDER — HYDROMORPHONE HCL 1 MG/ML IJ SOLN
1.0000 mg | Freq: Once | INTRAMUSCULAR | Status: AC
  Administered 2022-11-17: 1 mg via INTRAVENOUS
  Filled 2022-11-17: qty 1

## 2022-11-17 MED ORDER — HYDROMORPHONE HCL 1 MG/ML IJ SOLN
1.0000 mg | INTRAMUSCULAR | Status: AC
  Administered 2022-11-17: 1 mg via INTRAVENOUS

## 2022-11-17 NOTE — ED Provider Notes (Signed)
[place] EMERGENCY DEPARTMENT AT [facility] Provider Note   CSN: [id number] Arrival date & time: 11/17/22  1308     History  No chief complaint on file.   [name] is a 27 y.o. female with a past medical history significant for asthma, sickle cell anemia, substance abuse who presents to the ED due to sickle cell pain crisis.  Patient admits to pain in her arms, legs, and abdomen.  States pain started last night.  Notes triggers are typically change in weather.  No fever or chills.  Denies chest pain and shortness of breath.  No vaginal symptoms.  Last menstrual cycle was last month.  Not on chronic opioids.  Took Tylenol prior to arrival.  History obtained from patient and past medical records. No interpreter used during encounter.       Home Medications Prior to Admission medications   Medication Sig Start Date End Date Taking? Authorizing Provider  ibuprofen (ADVIL) 200 MG tablet Take 200 mg by mouth every 8 (eight) hours as needed for moderate pain. Patient not taking: Reported on 03/12/2022    [provider]  ondansetron (ZOFRAN) 4 MG tablet Take 1 tablet (4 mg total) by mouth every 6 (six) hours. Patient not taking: Reported on 03/10/2022 11/18/21   Prosperi, Christian H, PA-C      Allergies    Patient has no known allergies.    Review of Systems   Review of Systems  Constitutional:  Negative for chills and fever.  Respiratory:  Negative for shortness of breath.   Cardiovascular:  Negative for chest pain.  Gastrointestinal:  Positive for abdominal pain and nausea. Negative for diarrhea and vomiting.  Genitourinary:  Negative for dysuria and vaginal discharge.  All other systems reviewed and are negative.   Physical Exam Updated Vital Signs BP 102/84   Pulse (!) 109   Temp 98.6 F (37 C) (Oral)   Resp 20   Ht '5\' 6"'$  (1.676 m)   SpO2 100%   BMI 18.89 kg/m  Physical Exam Vitals and nursing note reviewed.  Constitutional:       General: She is not in acute distress.    Appearance: She is not ill-appearing.     Comments: Appears uncomfortable in bed.  HENT:     Head: Normocephalic.  Eyes:     Pupils: Pupils are equal, round, and reactive to light.  Cardiovascular:     Rate and Rhythm: Normal rate and regular rhythm.     Pulses: Normal pulses.     Heart sounds: Normal heart sounds. No murmur heard.    No friction rub. No gallop.  Pulmonary:     Effort: Pulmonary effort is normal.     Breath sounds: Normal breath sounds.  Abdominal:     General: Abdomen is flat. There is no distension.     Palpations: Abdomen is soft.     Tenderness: There is no abdominal tenderness. There is no guarding or rebound.     Comments: Diffuse tenderness  Musculoskeletal:        General: Normal range of motion.     Cervical back: Neck supple.  Skin:    General: Skin is warm and dry.  Neurological:     General: No focal deficit present.     Mental Status: She is alert.  Psychiatric:        Mood and Affect: Mood normal.        Behavior: Behavior normal.     ED Results /  Procedures / Treatments   Labs (all labs ordered are listed, but only abnormal results are displayed) Labs Reviewed  COMPREHENSIVE METABOLIC PANEL - Abnormal; Notable for the following components:      Result Value   Glucose, Bld 115 (*)    Calcium 8.8 (*)    Total Bilirubin 2.7 (*)    All other components within normal limits  CBC WITH DIFFERENTIAL/PLATELET - Abnormal; Notable for the following components:   WBC 14.2 (*)    RBC 2.61 (*)    Hemoglobin 9.2 (*)    HCT 25.7 (*)    MCH 35.2 (*)    RDW 17.0 (*)    Platelets 601 (*)    nRBC 1.8 (*)    Neutro Abs 12.0 (*)    All other components within normal limits  RETICULOCYTES - Abnormal; Notable for the following components:   Retic Ct Pct 14.8 (*)    RBC. 2.57 (*)    Retic Count, Absolute 385.5 (*)    Immature Retic Fract 26.2 (*)    All other components within normal limits  URINALYSIS,  ROUTINE W REFLEX MICROSCOPIC - Abnormal; Notable for the following components:   APPearance HAZY (*)    Leukocytes,Ua MODERATE (*)    All other components within normal limits  HCG, QUANTITATIVE, PREGNANCY - Abnormal; Notable for the following components:   hCG, Beta Chain, Quant, S 89 (*)    All other components within normal limits  I-STAT BETA HCG BLOOD, ED ([facility], [facility], [facility] ONLY) - Abnormal; Notable for the following components:   I-stat hCG, quantitative 79.5 (*)    All other components within normal limits  LIPASE, BLOOD  PREGNANCY, URINE    EKG None  Radiology No results found.  Procedures Procedures    Medications Ordered in ED Medications  HYDROmorphone (DILAUDID) injection 1 mg (has no administration in time range)  HYDROmorphone (DILAUDID) injection 0.5 mg (0.5 mg Intravenous Given 11/17/22 1543)  HYDROmorphone (DILAUDID) injection 1 mg (1 mg Intravenous Given 11/17/22 1812)  diphenhydrAMINE (BENADRYL) 12.5 mg in sodium chloride 0.9 % 50 mL IVPB (0 mg Intravenous Stopped 11/17/22 1912)  HYDROmorphone (DILAUDID) injection 1 mg (1 mg Intravenous Given 11/17/22 2123)    ED Course/ Medical Decision Making/ A&P Clinical Course as of 11/17/22 2210  Fri Nov 17, 2022  1511 WBC(!): 14.2 [CA]  1511 Hemoglobin(!): 9.2 [CA]  1511 I-stat hCG, quantitative(!): 79.5 [CA]  1748 Reassessed patient at bedside.  Patient mitts to continued pain.  Patient given 1 dose of Dilaudid.  Will give 1 more dose and reassess. [CA]  2050 Preg Test, Ur: NEGATIVE Reassessed patient at bedside.  Patient admits to continued pain and feels she needs to be admitted for sickle cell pain crisis.  Patient states abdominal pain has resolved.  Patient tolerating p.o. at bedside.  Will discuss with hospitalist for admission.  Abdomen soft, nondistended, nontender.  Low suspicion for acute abdomen.  Urine pregnancy test negative.  Low suspicion for ectopic pregnancy. [CA]  2054 Hcg quantitative elevated. Possible  early pregnancy??  [CA]    Clinical Course User Index [CA] Suzy Bouchard, PA-C                             Medical Decision Making Amount and/or Complexity of Data Reviewed External Data Reviewed: notes. Labs: ordered. Decision-making details documented in ED Course. Radiology: ordered and independent interpretation performed. Decision-making details documented in ED Course.  Risk Prescription drug  management.   This patient presents to the ED for concern of abdominal pain, leg/arm pain, this involves an extensive number of treatment options, and is a complaint that carries with it a high risk of complications and morbidity.  The differential diagnosis includes sickle cell pain crisis, acute abdomen, ectopic pregnancy, etc  27 year old female with a history of sickle cell anemia presents to the ED due to sickle cell pain crisis.  Patient endorses pain in her arms, legs, and abdomen.  No urinary or vaginal symptoms.  Denies chest pain and shortness of breath. LMP last month. Upon arrival, vitals all within normal limits.  Patient in no acute distress however, appears uncomfortable in bed.  Diffuse abdominal tenderness.  Will hold off on CT abdomen at this time and reassess after pain medication.  Sickle cell labs ordered.  IV Dilaudid and Benadryl given. Patient denies CP, low suspicion for PE or acute chest syndrome. Last admission for sickle cell was 02/2022. No infectious symptoms.   CBC significant for leukocytosis of 14.2.  Anemia with hemoglobin at 9.2 which appears to be around patient's baseline.  Reticulocytes at baseline.  CMP reassuring.  Normal renal function.  No major electrolyte derangements.  Mild hyperglycemia at 115.  No anion gap.  I-STAT hCG mildly elevated 79.5.  Will obtain quantitative hCG to rule out pregnancy. Lipase normal. Doubt pancreatitis. UA significant for leukocytes. No bacteria. Denies urinary symptoms, will hold on antibiotics.  Quantitative hcg came back  elevated. Upon reassessment, patient no longer having abdominal pain; however, given significant pain will order pelvic US to rule out ectopic pregnancy.   Patient handed off to [employer], PA-C at shift changing pending pelvic ultrasound and possible admission for sickle cell pain crisis.       Final Clinical Impression(s) / ED Diagnoses Final diagnoses:  Sickle cell pain crisis [facility])    Rx / DC Orders ED Discharge Orders     None         Karie Kirks 11/17/22 2211    Teressa Lower, MD 11/24/22 [phone]

## 2022-11-17 NOTE — ED Provider Notes (Signed)
Patient here with abdominal pain and leg pain.  Thought to maybe be SSC, but pain has significantly improved.    She is noted to have a new positive pregnancy test.  [place] was nondiagnostic.  Will need repeat HCG in 2 days.  I discussed this plan with Dr. Ilda Basset, from [facility], who agrees with plan for follow-up at [facility] in 2 days.  VSS.  HGB 9.2.  Pain improved.  DC to home.   Montine Circle, PA-C 11/17/22 2306    Elgie Congo, MD 11/19/22 [phone]

## 2022-11-17 NOTE — ED Triage Notes (Signed)
Patient arrives via EMS, ssc with pain in her legs. Patient reports she took tylenol in the ambulance.  Patient was last admitted for ssc in May of last year.  She is alert but noted to be restless.  She denies fever.

## 2022-11-17 NOTE — Discharge Instructions (Signed)
You had a positive pregnancy test.  The pregnancy was not visualized on the ultrasound, this likely means that it is too early to be seen.  You will need to have reassessment by an OBGYN in 2 days.

## 2022-11-17 NOTE — ED Provider Triage Note (Signed)
Emergency Medicine Provider Triage Evaluation Note  [name] , a 27 y.o. female  was evaluated in triage.  Pt complains of "pain everywhere".  Patient is a challenging historian, unable to tell me when it started, alleviating or worsening features.  Does endorse abdominal pain as well as back pain, unable to articulate that is typical of her sickle cell presentation.    Review of Systems  Per HPI  BP 100/60 (BP Location: Left Arm)   Pulse 84   Temp 97.8 F (36.6 C) (Oral)   Resp 15   Ht '5\' 6"'$  (1.676 m)   SpO2 100%   BMI 18.89 kg/m  Gen:   Awake, writhing, uncomfortable but not toxic Resp:  Normal effort  MSK:   Moves extremities without difficulty  Other:    Medical Decision Making  Medically screening exam initiated at 1:59 PM.  Appropriate orders placed.  [name] was informed that the remainder of the evaluation will be completed by another provider, this initial triage assessment does not replace that evaluation, and the importance of remaining in the ED until their evaluation is complete.     Sherrill Raring, PA-C 11/17/22 1359

## 2022-11-20 ENCOUNTER — Encounter: Payer: Self-pay | Admitting: Family Medicine

## 2022-11-20 ENCOUNTER — Telehealth: Payer: Self-pay | Admitting: Family Medicine

## 2022-11-20 NOTE — Telephone Encounter (Signed)
Called number listed for patient to get appointment scheduled, a man answered the call and stated it was the wrong number. The number listed for her aunt was then called and the call could not be completed. A letter was mailed to the patient.

## 2022-11-21 ENCOUNTER — Telehealth: Payer: Self-pay | Admitting: Family Medicine

## 2022-11-21 NOTE — Telephone Encounter (Signed)
Tried to reach patient through her alternate contact number. The line did not ring and would not send out the call. A letter was sent to the patient on 1/22.

## 2022-11-23 ENCOUNTER — Inpatient Hospital Stay (HOSPITAL_COMMUNITY): Payer: No Typology Code available for payment source

## 2022-11-23 ENCOUNTER — Inpatient Hospital Stay (HOSPITAL_COMMUNITY)
Admission: AD | Admit: 2022-11-23 | Discharge: 2022-11-23 | Discharge status: Against Medical Advice | Payer: No Typology Code available for payment source | Attending: Obstetrics & Gynecology | Admitting: Obstetrics & Gynecology

## 2022-11-23 ENCOUNTER — Other Ambulatory Visit: Payer: Self-pay

## 2022-11-23 ENCOUNTER — Encounter (HOSPITAL_COMMUNITY): Payer: Self-pay

## 2022-11-23 DIAGNOSIS — Z3A01 Less than 8 weeks gestation of pregnancy: Secondary | ICD-10-CM | POA: Insufficient documentation

## 2022-11-23 DIAGNOSIS — O26891 Other specified pregnancy related conditions, first trimester: Secondary | ICD-10-CM | POA: Insufficient documentation

## 2022-11-23 DIAGNOSIS — R109 Unspecified abdominal pain: Secondary | ICD-10-CM | POA: Insufficient documentation

## 2022-11-23 LAB — URINALYSIS, ROUTINE W REFLEX MICROSCOPIC
Bilirubin Urine: NEGATIVE
Glucose, UA: NEGATIVE mg/dL
Hgb urine dipstick: NEGATIVE
Ketones, ur: NEGATIVE mg/dL
Nitrite: NEGATIVE
Protein, ur: NEGATIVE mg/dL
Specific Gravity, Urine: 1.009 (ref 1.005–1.030)
pH: 6 (ref 5.0–8.0)

## 2022-11-23 LAB — CBC
HCT: 22.7 % — ABNORMAL LOW (ref 36.0–46.0)
Hemoglobin: 8.5 g/dL — ABNORMAL LOW (ref 12.0–15.0)
MCH: 37.1 pg — ABNORMAL HIGH (ref 26.0–34.0)
MCHC: 37.4 g/dL — ABNORMAL HIGH (ref 30.0–36.0)
MCV: 99.1 fL (ref 80.0–100.0)
Platelets: 355 10*3/uL (ref 150–400)
RBC: 2.29 MIL/uL — ABNORMAL LOW (ref 3.87–5.11)
RDW: 17.2 % — ABNORMAL HIGH (ref 11.5–15.5)
WBC: 11.5 10*3/uL — ABNORMAL HIGH (ref 4.0–10.5)
nRBC: 4 % — ABNORMAL HIGH (ref 0.0–0.2)

## 2022-11-23 LAB — WET PREP, GENITAL
Sperm: NONE SEEN
Trich, Wet Prep: NONE SEEN
WBC, Wet Prep HPF POC: <10 (ref <10)
Yeast Wet Prep HPF POC: NONE SEEN

## 2022-11-23 LAB — HCG, QUANTITATIVE, PREGNANCY: hCG, Beta Chain, Quant, S: 1764 m[IU]/mL — ABNORMAL HIGH (ref <5)

## 2022-11-23 NOTE — MAU Note (Signed)
[name] is a 27 y.o. here in MAU reporting: was told to come in after 2 days for repeat lab work, pt reports she has waited longer then 2 days. No pain or bleeding. Denies emesis.   LMP: unknown  Onset of complaint: ongoing  Pain score: 0/10  Vitals:   11/23/22 1204  BP: 111/60  Pulse: 83  Resp: 16  Temp: 98.6 F (37 C)  SpO2: 95%     FHT:NA  Lab orders placed from triage: entered by provider

## 2022-11-23 NOTE — MAU Note (Signed)
Patient requested to leave AMA to take s/o to work, patient encouraged to stay for results. AMA form signed, provider notified.

## 2022-11-24 LAB — GC/CHLAMYDIA PROBE AMP (~~LOC~~) NOT AT ARMC
Chlamydia: NEGATIVE
Comment: NEGATIVE
Comment: NORMAL
Neisseria Gonorrhea: NEGATIVE

## 2022-11-30 ENCOUNTER — Ambulatory Visit: Payer: No Typology Code available for payment source

## 2023-05-18 ENCOUNTER — Inpatient Hospital Stay (HOSPITAL_BASED_OUTPATIENT_CLINIC_OR_DEPARTMENT_OTHER): Payer: BLUE CROSS/BLUE SHIELD

## 2023-05-18 ENCOUNTER — Encounter (HOSPITAL_COMMUNITY): Payer: Self-pay | Admitting: Obstetrics & Gynecology

## 2023-05-18 ENCOUNTER — Observation Stay (HOSPITAL_COMMUNITY)
Admission: AD | Admit: 2023-05-18 | Discharge: 2023-05-19 | Disposition: A | Discharge status: Home / Self Care | Payer: BLUE CROSS/BLUE SHIELD | Attending: Obstetrics and Gynecology | Admitting: Obstetrics and Gynecology

## 2023-05-18 DIAGNOSIS — R102 Pelvic and perineal pain: Secondary | ICD-10-CM

## 2023-05-18 DIAGNOSIS — O021 Missed abortion: Secondary | ICD-10-CM | POA: Diagnosis not present

## 2023-05-18 DIAGNOSIS — O26892 Other specified pregnancy related conditions, second trimester: Secondary | ICD-10-CM | POA: Diagnosis not present

## 2023-05-18 DIAGNOSIS — O98112 Syphilis complicating pregnancy, second trimester: Secondary | ICD-10-CM

## 2023-05-18 DIAGNOSIS — O0932 Supervision of pregnancy with insufficient antenatal care, second trimester: Secondary | ICD-10-CM | POA: Diagnosis not present

## 2023-05-18 DIAGNOSIS — F1410 Cocaine abuse, uncomplicated: Secondary | ICD-10-CM

## 2023-05-18 DIAGNOSIS — O368320 Maternal care for abnormalities of the fetal heart rate or rhythm, second trimester, not applicable or unspecified: Secondary | ICD-10-CM

## 2023-05-18 DIAGNOSIS — O99012 Anemia complicating pregnancy, second trimester: Secondary | ICD-10-CM

## 2023-05-18 DIAGNOSIS — O034 Incomplete spontaneous abortion without complication: Secondary | ICD-10-CM | POA: Diagnosis not present

## 2023-05-18 DIAGNOSIS — A530 Latent syphilis, unspecified as early or late: Secondary | ICD-10-CM | POA: Diagnosis present

## 2023-05-18 DIAGNOSIS — Z3A15 15 weeks gestation of pregnancy: Secondary | ICD-10-CM | POA: Diagnosis not present

## 2023-05-18 DIAGNOSIS — O99322 Drug use complicating pregnancy, second trimester: Secondary | ICD-10-CM | POA: Diagnosis not present

## 2023-05-18 DIAGNOSIS — O321XX0 Maternal care for breech presentation, not applicable or unspecified: Secondary | ICD-10-CM | POA: Diagnosis not present

## 2023-05-18 DIAGNOSIS — O99320 Drug use complicating pregnancy, unspecified trimester: Secondary | ICD-10-CM | POA: Diagnosis present

## 2023-05-18 DIAGNOSIS — D571 Sickle-cell disease without crisis: Secondary | ICD-10-CM

## 2023-05-18 DIAGNOSIS — O364XX0 Maternal care for intrauterine death, not applicable or unspecified: Secondary | ICD-10-CM

## 2023-05-18 LAB — CBC
HCT: 24 % — ABNORMAL LOW (ref 36.0–46.0)
Hemoglobin: 8.8 g/dL — ABNORMAL LOW (ref 12.0–15.0)
MCH: 37 pg — ABNORMAL HIGH (ref 26.0–34.0)
MCHC: 36.7 g/dL — ABNORMAL HIGH (ref 30.0–36.0)
MCV: 100.8 fL — ABNORMAL HIGH (ref 80.0–100.0)
Platelets: 582 10*3/uL — ABNORMAL HIGH (ref 150–400)
RBC: 2.38 MIL/uL — ABNORMAL LOW (ref 3.87–5.11)
RDW: 17.8 % — ABNORMAL HIGH (ref 11.5–15.5)
WBC: 13.2 10*3/uL — ABNORMAL HIGH (ref 4.0–10.5)
nRBC: 2.6 % — ABNORMAL HIGH (ref 0.0–0.2)

## 2023-05-18 LAB — TYPE AND SCREEN
ABO/RH(D): B POS
Antibody Screen: NEGATIVE

## 2023-05-18 LAB — COMPREHENSIVE METABOLIC PANEL
ALT: 16 U/L (ref 0–44)
AST: 26 U/L (ref 15–41)
Albumin: 4 g/dL (ref 3.5–5.0)
Alkaline Phosphatase: 70 U/L (ref 38–126)
Anion gap: 13 (ref 5–15)
BUN: 9 mg/dL (ref 6–20)
CO2: 20 mmol/L — ABNORMAL LOW (ref 22–32)
Calcium: 9.8 mg/dL (ref 8.9–10.3)
Chloride: 104 mmol/L (ref 98–111)
Creatinine, Ser: 0.76 mg/dL (ref 0.44–1.00)
GFR, Estimated: >60 mL/min (ref >60)
Glucose, Bld: 92 mg/dL (ref 70–99)
Potassium: 4 mmol/L (ref 3.5–5.1)
Sodium: 137 mmol/L (ref 135–145)
Total Bilirubin: 4.2 mg/dL — ABNORMAL HIGH (ref 0.3–1.2)
Total Protein: 7.7 g/dL (ref 6.5–8.1)

## 2023-05-18 LAB — RAPID HIV SCREEN (HIV 1/2 AB+AG)
HIV 1/2 Antibodies: NONREACTIVE
HIV-1 P24 Antigen - HIV24: NONREACTIVE

## 2023-05-18 MED ORDER — SIMETHICONE 80 MG PO CHEW: 80.0000 mg | CHEWABLE_TABLET | ORAL | Status: DC | PRN

## 2023-05-18 MED ORDER — SOD CITRATE-CITRIC ACID 500-334 MG/5ML PO SOLN: 30.0000 mL | ORAL | Status: DC | PRN

## 2023-05-18 MED ORDER — FENTANYL CITRATE (PF) 100 MCG/2ML IJ SOLN
50.0000 ug | INTRAMUSCULAR | Status: DC | PRN
  Administered 2023-05-18 (×2): 50 ug via INTRAVENOUS

## 2023-05-18 MED ORDER — BENZOCAINE-MENTHOL 20-0.5 % EX AERO: 1.0000 | INHALATION_SPRAY | CUTANEOUS | Status: DC | PRN

## 2023-05-18 MED ORDER — ONDANSETRON HCL 4 MG/2ML IJ SOLN: 4.0000 mg | INTRAMUSCULAR | Status: DC | PRN

## 2023-05-18 MED ORDER — LACTATED RINGERS IV SOLN: INTRAVENOUS | Status: DC

## 2023-05-18 MED ORDER — LIDOCAINE HCL (PF) 1 % IJ SOLN: 30.0000 mL | INTRAMUSCULAR | Status: DC | PRN

## 2023-05-18 MED ORDER — NICOTINE 21 MG/24HR TD PT24
21.0000 mg | MEDICATED_PATCH | Freq: Every day | TRANSDERMAL | Status: DC
  Administered 2023-05-18: 21 mg via TRANSDERMAL
  Filled 2023-05-18 (×2): qty 1

## 2023-05-18 MED ORDER — OXYCODONE-ACETAMINOPHEN 5-325 MG PO TABS
2.0000 | ORAL_TABLET | ORAL | Status: DC | PRN
  Administered 2023-05-18 – 2023-05-19 (×4): 2 via ORAL
  Filled 2023-05-18 (×2): qty 2

## 2023-05-18 MED ORDER — OXYTOCIN-SODIUM CHLORIDE 30-0.9 UT/500ML-% IV SOLN
2.5000 [IU]/h | INTRAVENOUS | Status: DC | PRN
  Administered 2023-05-18: 2.5 [IU]/h via INTRAVENOUS

## 2023-05-18 MED ORDER — ZOLPIDEM TARTRATE 5 MG PO TABS
5.0000 mg | ORAL_TABLET | Freq: Every evening | ORAL | Status: DC | PRN
  Administered 2023-05-19: 5 mg via ORAL
  Filled 2023-05-18: qty 1

## 2023-05-18 MED ORDER — TETANUS-DIPHTH-ACELL PERTUSSIS 5-2.5-18.5 LF-MCG/0.5 IM SUSY: 0.5000 mL | PREFILLED_SYRINGE | Freq: Once | INTRAMUSCULAR | Status: DC

## 2023-05-18 MED ORDER — ACETAMINOPHEN 325 MG PO TABS: 650.0000 mg | ORAL_TABLET | ORAL | Status: DC | PRN

## 2023-05-18 MED ORDER — DIBUCAINE (PERIANAL) 1 % EX OINT: 1.0000 | TOPICAL_OINTMENT | CUTANEOUS | Status: DC | PRN

## 2023-05-18 MED ORDER — MISOPROSTOL 200 MCG PO TABS
400.0000 ug | ORAL_TABLET | ORAL | Status: DC | PRN
  Administered 2023-05-18: 400 ug via ORAL

## 2023-05-18 MED ORDER — PRENATAL MULTIVITAMIN CH: 1.0000 | ORAL_TABLET | Freq: Every day | ORAL | Status: DC

## 2023-05-18 MED ORDER — DIPHENHYDRAMINE HCL 25 MG PO CAPS: 25.0000 mg | ORAL_CAPSULE | Freq: Four times a day (QID) | ORAL | Status: DC | PRN

## 2023-05-18 MED ORDER — IBUPROFEN 600 MG PO TABS
600.0000 mg | ORAL_TABLET | Freq: Four times a day (QID) | ORAL | Status: DC
  Administered 2023-05-18 – 2023-05-19 (×4): 600 mg via ORAL
  Filled 2023-05-18 (×3): qty 1

## 2023-05-18 MED ORDER — SENNOSIDES-DOCUSATE SODIUM 8.6-50 MG PO TABS: 2.0000 | ORAL_TABLET | Freq: Every day | ORAL | Status: DC

## 2023-05-18 MED ORDER — ONDANSETRON HCL 4 MG/2ML IJ SOLN: 4.0000 mg | Freq: Four times a day (QID) | INTRAMUSCULAR | Status: DC | PRN

## 2023-05-18 MED ORDER — COCONUT OIL OIL: 1.0000 | TOPICAL_OIL | Status: DC | PRN

## 2023-05-18 MED ORDER — ONDANSETRON HCL 4 MG PO TABS: 4.0000 mg | ORAL_TABLET | ORAL | Status: DC | PRN

## 2023-05-18 MED ORDER — WITCH HAZEL-GLYCERIN EX PADS: 1.0000 | MEDICATED_PAD | CUTANEOUS | Status: DC | PRN

## 2023-05-18 MED ORDER — OXYCODONE-ACETAMINOPHEN 5-325 MG PO TABS: 1.0000 | ORAL_TABLET | ORAL | Status: DC | PRN

## 2023-05-18 NOTE — H&P (Cosign Needed Addendum)
[name] is a 27 y.o. female presenting via EMS for pelvic pain and bleeding.  Was found to have amniotic sac protruding from vaginal by EMS.  States she has been doing cocaine all day and had felt a little cramping and then felt "something in my vagina".   History is remarkable for pregnancy diagnosed in January (HCG 89) but no followup after that.  She has Sickle Cell Anemia with last crisis treated in hospital in May 2023.  Patient Active Problem List   Diagnosis Date Noted   Incomplete inevitable abortion 05/18/2023   Sickle cell crisis acute chest syndrome (HCC) 02/07/2021   Infestation by bed bug 02/07/2021   Community acquired pneumonia 07/08/2019   Cocaine-induced mood disorder (HCC)    Generalized anxiety disorder    Cocaine abuse (HCC)    Elevated liver enzymes 05/17/2018   Sickle cell anemia (HCC) 05/16/2018   Sickle cell crisis (HCC) 05/16/2018   Sickle cell anemia with crisis (HCC) 12/16/2017   Substance abuse (HCC) 12/16/2017   Homelessness 02/12/2017   Protein-calorie malnutrition, severe 10/21/2016   Anemia of chronic disease    Hb-SS disease without crisis (HCC)    Hereditary hemolytic anemia (HCC)    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   Tobacco abuse    Leukocytosis    Sickle cell pain crisis (HCC) 02/15/2013   Abdominal pain 07/25/2012   Asthma, mild intermittent 12/14/2010     OB History     Gravida  1   Para      Term      Preterm      AB      Living         SAB      IAB      Ectopic      Multiple      Live Births             Past Medical History:  Diagnosis Date   Acute kidney injury (HCC) 05/15/2016   Amenorrhea    Anemia    SICKLE CELL   Asthma    Drug-induced pruritus 02/26/2015   GERD (gastroesophageal reflux disease)    Headache    Hyperbilirubinemia 02/26/2015   Sickle cell disease (HCC)    Past Surgical History:  Procedure Laterality Date   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 Social History:  reports that she has been smoking cigarettes. She has a 5 pack-year smoking history. She has never used smokeless tobacco. She reports that she does not currently use alcohol after a past usage of about 6.0 standard drinks of alcohol per week. She reports current drug use. Drugs: Marijuana and Cocaine.     Maternal Diabetes: No Genetic Screening: Declined Maternal Ultrasounds/Referrals: Other: None Fetal Ultrasounds or other Referrals:  None Maternal Substance Abuse:  Yes:  Type: Cocaine, Other:  Significant Maternal Medications:  None Significant Maternal Lab Results:  None Number of Prenatal Visits:Less than or equal to 3 verified prenatal visits Other Comments:  None  Review of Systems  Constitutional:  Negative for chills and fever.  Respiratory:  Negative for shortness of breath.   Gastrointestinal:  Positive for abdominal pain. Negative for vomiting.  Genitourinary:  Positive for pelvic pain and vaginal bleeding (small bloody show iwth membranes protruding). Negative for  vaginal discharge.   Maternal Medical History:  Reason for admission: Contractions.   Contractions: Unknown  Fetal activity: Perceived fetal activity is none.   Prenatal complications: Substance abuse.   No bleeding.       Blood pressure 127/68, pulse 90, temperature 98.8 F (37.1 C), temperature source Oral, resp. rate 18, SpO2 98%. Maternal Exam:  Abdomen: Patient reports no abdominal tenderness. Introitus: Normal vulva. Normal vagina.  Ferning test: not done.  Nitrazine test: not done. Amniotic fluid character: not assessed. Pelvis: adequate for delivery.     Physical Exam Genitourinary:    General: Normal vulva.     Bedside US done to confirm Gestational Age and life Measurements [redacted]w[redacted]d No fetal  heart rate Fetus in lower uterine segment and cervix  Prenatal labs: ABO, Rh:  B+ Antibody:   Rubella:   RPR:    HBsAg:    HIV:    GBS:     Assessment/Plan: Single intrauterine pregnancy at [redacted]w[redacted]d Intrauterine fetal demise Inevitable incomplete abortion Cocaine use, current Sickle Cell Anemia  Admit to [facility] IV hydration to help prevent crisis Cytotec MD to follow   Wynelle Bourgeois 05/18/2023, 4:17 AM

## 2023-05-18 NOTE — Progress Notes (Signed)
MD called to room for imminent delivery of nonviable fetus.  Upon arrival, [name] was holding delivered fetus.  I was able to examine the fetus and placenta. Upon examination, delivery was complete and the placenta appeared intact.  Mother was in the restroom and was attempting to get into the shower with nursing support.  Bleeding was minimal.  Orders placed for routine postpartum meds.  Social work consult placed.   Mariel Aloe, MD

## 2023-05-18 NOTE — Plan of Care (Signed)
  Problem: Safety: Goal: Risk of complications during labor and delivery will decrease Outcome: Progressing   Problem: Education: Goal: Knowledge of General Education information will improve Description: Including pain rating scale, medication(s)/side effects and non-pharmacologic comfort measures Outcome: Progressing   Problem: Health Behavior/Discharge Planning: Goal: Ability to manage health-related needs will improve Outcome: Progressing   Problem: Clinical Measurements: Goal: Ability to maintain clinical measurements within normal limits will improve Outcome: Progressing Goal: Will remain free from infection Outcome: Progressing Goal: Diagnostic test results will improve Outcome: Progressing Goal: Respiratory complications will improve Outcome: Progressing Goal: Cardiovascular complication will be avoided Outcome: Progressing   Problem: Activity: Goal: Risk for activity intolerance will decrease Outcome: Progressing   Problem: Nutrition: Goal: Adequate nutrition will be maintained Outcome: Progressing   Problem: Coping: Goal: Level of anxiety will decrease Outcome: Progressing   Problem: Elimination: Goal: Will not experience complications related to bowel motility Outcome: Progressing Goal: Will not experience complications related to urinary retention Outcome: Progressing   Problem: Pain Managment: Goal: General experience of comfort will improve Outcome: Progressing   Problem: Safety: Goal: Ability to remain free from injury will improve Outcome: Progressing   Problem: Skin Integrity: Goal: Risk for impaired skin integrity will decrease Outcome: Progressing   Problem: Education: Goal: Knowledge of condition will improve Outcome: Progressing Goal: Individualized Educational Video(s) Outcome: Progressing Goal: Individualized Newborn Educational Video(s) Outcome: Progressing   Problem: Activity: Goal: Will verbalize the importance of balancing  activity with adequate rest periods Outcome: Progressing Goal: Ability to tolerate increased activity will improve Outcome: Progressing   Problem: Coping: Goal: Ability to identify and utilize available resources and services will improve Outcome: Progressing   Problem: Life Cycle: Goal: Chance of risk for complications during the postpartum period will decrease Outcome: Progressing   Problem: Role Relationship: Goal: Ability to demonstrate positive interaction with newborn will improve Outcome: Progressing   Problem: Skin Integrity: Goal: Demonstration of wound healing without infection will improve Outcome: Progressing

## 2023-05-18 NOTE — Progress Notes (Signed)
   05/18/23 1154  Spiritual Encounters  Type of Visit Initial  Care provided to: Pt and family  Conversation partners present during encounter Social worker/Care management/TOC  Referral source Physician  Reason for visit Grief/loss  OnCall Visit No  Spiritual Framework  Presenting Themes Values and beliefs;Rituals and practive;Significant life change;Impactful experiences and emotions;Courage hope and growth;Meaning/purpose/sources of inspiration  Values/beliefs belief in heaven and prayer  Community/Connection Significant other  Patient Stress Factors Loss  Family Stress Factors Loss  Interventions  Spiritual Care Interventions Made Established relationship of care and support;Compassionate presence;Reflective listening;Normalization of emotions;Narrative/life review;Explored values/beliefs/practices/strengths;Bereavement/grief support;Prayer;Encouragement;Supported grief process  Intervention Outcomes  Outcomes Connection to spiritual care;Awareness of health;Awareness of support;Reduced anxiety;Reduced isolation;Patient family open to resources  Spiritual Care Plan  Spiritual Care Issues Still Outstanding No further spiritual care needs at this time (see row info)   Chaplain Dervin Vore shadowed by [employer] provided care for patient and significant other after a fetal loss. Patient had named the child. Patient expressed some guilt over the loss as she was not caring for herself. Patient stated she was "in the streets and doing drugs." Child's father Freida Busman) expressed his love and support for the patient. He also expressed is experience of grief over the loss of the child. Patient has recently experienced the loss of her father in November 2023. Baby's father asked for prayer for himself, the patient and the baby.   Chaplain also provided patient and baby's father with funeral home and cremation information.  No other spiritual care needs at this time.   Arlyce Dice, Chaplain Resident  [phone]

## 2023-05-18 NOTE — Progress Notes (Signed)
CSW attempted to meet with [name] at bedside to offer resources and supports.  When CSW arrived, [name] was receiving support from the Spiritual Care team.  The Spiritual Care team made CSW aware that they will provide funeral home information to parents.   CSW will attempt to meet with [name] at a later time.   [name], MSW, LCSW Clinical Social Work [phone]

## 2023-05-18 NOTE — Clinical SW OB High Risk (Signed)
[facility]  Clinical Social Worker:  [name], [place] Date/Time:  05/18/2023, 2:25 PM Gestational Age on Admission:  28 y.o. Admitting Diagnosis:  Intrauterine Fetal Demise   Expected Delivery Date:   unknown  Family/Home Environment  Home Address:  [address] [place] [address]  Household Member/Support Name:  [name] Relationship:   (significant other)  Psychosocial Data  Employment:  Unemployed  Type of Work:    Education: 9 to 11 years ([name] shared she completed 9th grade.)   Cultural/Environment Issues Impacting Care:  none reported   Strengths/Weaknesses/Factors to Consider  Concerns Related to Hospitalization:  None reported   Previous Pregnancies/Feelings Towards Pregnancy?  Concerns related to being/becoming a mother?:  [name] shared that she was excited about being pregnant.  She expressed that her aunt and significant other was also happy for her and was going to be a support.   Social Support ([name]? Who is/will be helping with baby/other kids?): Spouse/significant other, Extended Family (Per [name] her aunt [name] is a support.)    Recent Stressful Life Events (life changes in past year?):  None reported   Prenatal Care/Education/Home Preparations: no/ not need due to fetal demise.    Domestic Violence (of any type):  No If Yes to Domestic Violence, D  Substance Use During Pregnancy: Yes (hxof cocaine and THC use)  Clinical Assessment/Plan:  CSW met with [name] and [name] at [name]'s bedside in room 315.  When CSW arrived, the couple was asleep however they were easily awaken.  CSW introduced self and explained the need to complete a clinical assessment. [name] gave CSW  permission to complete assessment while [name] was present.  [name] appeared to be a support to [name] and engaged with CSW. [name] appeared forthcoming, was easy to engage, and was receptive to meeting with CSW.  CSW confirmed that Spiritual Care department provided the couple with  information for cremation planning.  CSW assessed for MH hx and [name] denied any MH hx and she declined information for PMADs.  [name] communicated that he and [name] have a Peer Support person that will be connecting them with outpatient counseling services. [name] was not able to recall the agency the Peer Support is associated with.   CSW asked about SA hx.  [name] communicated that use of cocaine and THC.  Per [name], her last use was yesterday.  CSW made [name] aware that affects that illicit substances can have on pregnant women; [name] was understanding. CSW offered SA resources and [name] declined and shared that her peer support person will assist with resources.    The couple communicated the need for transportation assistance post [name]'s discharge.  CSW agreed to ensure that the couple receive a taxi voucher (CSW made RN aware).   No other needs or resources are need a this time and there are no barriers to [name]'s discharge.   [name], MSW, LCSW Clinical Social Work [phone]

## 2023-05-19 ENCOUNTER — Other Ambulatory Visit: Payer: Self-pay

## 2023-05-19 ENCOUNTER — Encounter (HOSPITAL_COMMUNITY): Payer: Self-pay | Admitting: Obstetrics & Gynecology

## 2023-05-19 DIAGNOSIS — O034 Incomplete spontaneous abortion without complication: Secondary | ICD-10-CM | POA: Diagnosis not present

## 2023-05-19 LAB — CBC
HCT: 20.1 % — ABNORMAL LOW (ref 36.0–46.0)
Hemoglobin: 7.3 g/dL — ABNORMAL LOW (ref 12.0–15.0)
MCH: 37.8 pg — ABNORMAL HIGH (ref 26.0–34.0)
MCHC: 36.3 g/dL — ABNORMAL HIGH (ref 30.0–36.0)
MCV: 104.1 fL — ABNORMAL HIGH (ref 80.0–100.0)
Platelets: 447 10*3/uL — ABNORMAL HIGH (ref 150–400)
RBC: 1.93 MIL/uL — ABNORMAL LOW (ref 3.87–5.11)
RDW: 18.3 % — ABNORMAL HIGH (ref 11.5–15.5)
WBC: 15.3 10*3/uL — ABNORMAL HIGH (ref 4.0–10.5)
nRBC: 3.3 % — ABNORMAL HIGH (ref 0.0–0.2)

## 2023-05-19 LAB — RPR: RPR Ser Ql: NONREACTIVE — AB

## 2023-05-19 NOTE — Discharge Summary (Signed)
Physician Discharge Summary  Patient ID: [name] MRN: [id number] DOB/AGE: [birth year] 27 y.o.  Admit date: 05/18/2023 Discharge date: 05/19/2023  Admission Diagnoses:  Discharge Diagnoses:  Principal Problem:   Incomplete inevitable abortion Active Problems:   Drug use complicating pregnancy   Discharged Condition: {condition:18240}  Hospital Course: ***  Consults: {consultation:18241}  Significant Diagnostic Studies: {diagnostics:18242}  Treatments: {Tx:18249}  Discharge Exam: Blood pressure 113/69, pulse 77, temperature 97.9 F (36.6 C), temperature source Oral, resp. rate 16, SpO2 98%. {physical WUJW:1191478}  Disposition: Discharge disposition: 01-Home or Self Care       Discharge Instructions     Discharge patient   Complete by: As directed    Discharge disposition: 01-Home or Self Care   Discharge patient date: 05/19/2023      Allergies as of 05/19/2023   No Known Allergies      Medication List     TAKE these medications    ibuprofen 200 MG tablet Commonly known as: ADVIL Take 200 mg by mouth every 8 (eight) hours as needed for moderate pain.   ondansetron 4 MG tablet Commonly known as: ZOFRAN Take 1 tablet (4 mg total) by mouth every 6 (six) hours.         Signed: Brillion Bing 05/19/2023, 7:31 AM

## 2023-05-19 NOTE — Progress Notes (Signed)
CSW provided clothing for [name] as requested by RN.  CSW also provided a taxi voucher to utilize at discharge.      [name], MSW, LCSW Clinical Social Work [phone]

## 2023-05-21 ENCOUNTER — Telehealth: Payer: Self-pay | Admitting: Family Medicine

## 2023-05-21 NOTE — Telephone Encounter (Signed)
Contacted by pathology regarding autopsy request. Per pathologist since fetus is 15 weeks they can not do pathology. After talking w Dr. Donavan Foil appears there was misunderstanding about the ability to do autopsy. Please contact patient and let them know this will not be possible and remains will be sent to funeral home they have selected.

## 2023-05-22 NOTE — Telephone Encounter (Signed)
Called number listed and [name], her aunt answered. She gave me a number of [phone] for [name], patient's boyfriend. This is the number that patient generally called the Aunt from.   Aunt reported that patient has decided on a funeral home and is not sure if patient has called to release body. Reviewed the office will speak with the patient once she is reached.   Asked aunt to have patient call our office at [phone] as we need to speak with her. [name] voiced understanding.   Called Allen's number and LM for [name] to call the office at [phone] at her earliest convenience.

## 2023-05-23 DIAGNOSIS — A530 Latent syphilis, unspecified as early or late: Secondary | ICD-10-CM | POA: Diagnosis present

## 2023-05-23 NOTE — Telephone Encounter (Addendum)
Called patient on boyfriends number. Spoke with Britt Bottom at [phone]. He reports Annessa is " at the house" and he is at a doctor's appointment. He will give Sherika a message to call the office at [phone] once he is with her later.

## 2023-05-28 NOTE — Telephone Encounter (Signed)
Called patient and previously listed number for patient's boyfriend. He answered stating she was currently out of the house but would have her call us. He states she called back before but couldn't reach anyone. Told him as long as she calls before 4, it should go to [place] and not the after hours service. He verbalized understanding.

## 2023-06-14 ENCOUNTER — Telehealth (HOSPITAL_COMMUNITY): Payer: Self-pay | Admitting: *Deleted

## 2023-06-14 NOTE — Telephone Encounter (Signed)
06/14/2023  Name: [name] MRN: [id number] DOB: 12-16-1995  Reason for Call:  Transition of Care Hospital Discharge Call  Contact Status: Patient Contact Status: Message  Language assistant needed: Interpreter Mode: Interpreter Not Needed        Follow-Up Questions:   Called number on patient's chart and spoke to her aunt.  Her aunt said that she mostly takes phone calls for the patient and helps her with "important things".  She said that the last time she spoke to patient that patient was a "little sad".  Patient's aunt suggested I call patient's boyfriend's phone [phone].  When I called boyfriend's phone, I got voice mail and left a message for patient.  Edinburgh Postnatal Depression Scale:  In the Past 7 Days:    PHQ2-9 Depression Scale:     Discharge Follow-up:    Post-discharge interventions: NA  Salena Saner, RN 06/18/2023 13:28

## 2024-10-20 ENCOUNTER — Ambulatory Visit (HOSPITAL_BASED_OUTPATIENT_CLINIC_OR_DEPARTMENT_OTHER)

## 2024-11-05 ENCOUNTER — Ambulatory Visit (INDEPENDENT_AMBULATORY_CARE_PROVIDER_SITE_OTHER): Payer: Self-pay | Admitting: Nurse Practitioner

## 2024-11-05 ENCOUNTER — Encounter: Payer: Self-pay | Admitting: Nurse Practitioner

## 2024-11-05 VITALS — BP 96/56 | HR 88 | Ht 66.0 in | Wt 120.0 lb

## 2024-11-05 DIAGNOSIS — Z5941 Food insecurity: Secondary | ICD-10-CM

## 2024-11-05 DIAGNOSIS — F1410 Cocaine abuse, uncomplicated: Secondary | ICD-10-CM

## 2024-11-05 DIAGNOSIS — F1911 Other psychoactive substance abuse, in remission: Secondary | ICD-10-CM | POA: Insufficient documentation

## 2024-11-05 DIAGNOSIS — N643 Galactorrhea not associated with childbirth: Secondary | ICD-10-CM

## 2024-11-05 DIAGNOSIS — D571 Sickle-cell disease without crisis: Secondary | ICD-10-CM | POA: Diagnosis not present

## 2024-11-05 DIAGNOSIS — F17200 Nicotine dependence, unspecified, uncomplicated: Secondary | ICD-10-CM | POA: Diagnosis not present

## 2024-11-05 DIAGNOSIS — Z0000 Encounter for general adult medical examination without abnormal findings: Secondary | ICD-10-CM | POA: Diagnosis not present

## 2024-11-05 DIAGNOSIS — Z113 Encounter for screening for infections with a predominantly sexual mode of transmission: Secondary | ICD-10-CM | POA: Diagnosis not present

## 2024-11-05 DIAGNOSIS — Z23 Encounter for immunization: Secondary | ICD-10-CM | POA: Diagnosis not present

## 2024-11-05 DIAGNOSIS — Z59819 Housing instability, housed unspecified: Secondary | ICD-10-CM | POA: Diagnosis not present

## 2024-11-05 DIAGNOSIS — N926 Irregular menstruation, unspecified: Secondary | ICD-10-CM

## 2024-11-05 LAB — POCT URINALYSIS DIP (CLINITEK)
Bilirubin, UA: NEGATIVE
Glucose, UA: NEGATIVE mg/dL
Ketones, POC UA: NEGATIVE mg/dL
Leukocytes, UA: NEGATIVE
Nitrite, UA: NEGATIVE
POC PROTEIN,UA: NEGATIVE
Spec Grav, UA: 1.01
Urobilinogen, UA: >=8 U/dL — AB
pH, UA: 6

## 2024-11-05 LAB — POCT URINE PREGNANCY: Preg Test, Ur: NEGATIVE

## 2024-11-05 NOTE — Assessment & Plan Note (Addendum)
 Currently lives with her grandparents Find help resources provided

## 2024-11-05 NOTE — Assessment & Plan Note (Signed)
" °  Smokes three to four cigarettes a day. Discussed health risks and encouraged quitting. - Encouraged smoking cessation. "

## 2024-11-05 NOTE — Assessment & Plan Note (Signed)
 Need to quit using drugs discussed, declined referral for drug rehab

## 2024-11-05 NOTE — Progress Notes (Signed)
 6  New Patient Office Visit  Subjective:  Patient ID: [name], female    DOB: 24-Sep-1996  Age: 29 y.o. MRN: [id number]  CC:  Chief Complaint  Patient presents with   Establish Care   Sickle Cell Anemia    HPI .   SABRADiscussed the use of AI scribe software for clinical note transcription with the patient, who gave verbal consent to proceed.  History of Present Illness [name] is a 29 year old female  has a past medical history of Acute kidney injury (05/15/2016), Amenorrhea, Anemia, Asthma, Drug-induced pruritus (02/26/2015), GERD (gastroesophageal reflux disease), Headache, Hyperbilirubinemia (02/26/2015), and Sickle cell disease (HCC).  with sickle cell disease who presents with concerns of a possible STD and galactorrhea.  She has experienced a change in her vaginal discharge color to white to yellow for the past couple of weeks. She is sexually active and not on any medication. No rashes or blood in her urine.  She reports galactorrhea for about a month, describing it as 'milk in my titties'. She is not breastfeeding and is interested in a pregnancy test. Her last menstrual period was irregular, with intermittent bleeding last month.  No chest pain, shortness of breath, abdominal pain, nausea, or vomiting. She reports poor sleep the previous night.  She has a history of sickle cell disease but notes infrequent sickle cell crises.  Currently denies pain  She smokes three to four cigarettes a day and lives with her grandparents. 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   TONSILLECTOMY     Age 68    Family History  Problem Relation Age of Onset   Hypertension Paternal Grandfather    Sickle cell trait Father    Cancer Mother        Died in 2008-12-06    Social History   Socioeconomic History   Marital status: Single    Spouse name: Not on file   Number of children: 0   Years of education: Not on file   Highest education level: Not on file  Occupational History   Occupation: unemployed  Tobacco Use   Smoking status: Every Day    Current packs/day: 0.50    Average packs/day: 0.5 packs/day for 10.0 years (5.0 ttl pk-yrs)    Types: Cigarettes   Smokeless tobacco: Never  Vaping Use   Vaping status: Never Used  Substance and Sexual Activity   Alcohol use: Not Currently    Alcohol/week: 6.0 standard drinks of alcohol    Types: 6 Shots of liquor per week   Drug use: Yes    Types: Marijuana, Cocaine    Sexual activity: Yes    Birth control/protection: Condom  Other Topics Concern   Not on file  Social History Narrative   Pt is doing much better in school [name] 11th grade)Really wants to go to college now after talking to her cousin.  Pt has aspiration of being a lawyer/physician/nurse/owning business after school.Pt is now living with her father again.  Mom died at age 2 and patient has a lot of fears abotu dying young and really wants to be a mom but has other goals. She was 13 at time of moms death and does not have much contact with her 30 year old biological sister which saddens her.             Lives with her grandparents    Social Drivers of Health   Tobacco Use: High Risk (11/05/2024)   Patient History    Smoking Tobacco Use: Every Day    Smokeless Tobacco Use: Never    Passive Exposure: Not on file  Financial Resource Strain: Not on file  Food Insecurity: Food Insecurity Present (11/05/2024)   Epic    Worried About Programme Researcher, Broadcasting/film/video in the Last Year: Sometimes true    Barista in the Last Year: Sometimes true   Transportation Needs: Unmet Transportation Needs (05/18/2023)   PRAPARE - Administrator, Civil Service (Medical): Yes    Lack of Transportation (Non-Medical): Yes  Physical Activity: Not on file  Stress: Not on file  Social Connections: Not on file  Intimate Partner Violence: Not At Risk (11/05/2024)   Epic    Fear of Current or Ex-Partner: No    Emotionally Abused: No    Physically Abused: No    Sexually Abused: No  Depression (PHQ2-9): Low Risk (11/05/2024)   Depression (PHQ2-9)    PHQ-2 Score: 0  Alcohol Screen: Not on file  Housing: High Risk (11/05/2024)   Epic    Unable to Pay for Housing in the Last Year: Yes    Number of Times Moved in the Last Year: 10    Homeless in the Last Year: Yes  Utilities: Patient Declined (11/05/2024)   Epic    Threatened with loss of utilities: Patient declined  Health Literacy: Not on file    ROS Review of Systems  Constitutional:  Negative for appetite change, chills, fatigue and fever.  HENT:  Negative for congestion, postnasal drip, rhinorrhea and sneezing.   Respiratory:  Negative for cough, shortness of breath and wheezing.   Cardiovascular:  Negative for chest pain, palpitations and leg swelling.  Gastrointestinal:  Negative for abdominal pain, constipation, nausea and vomiting.  Genitourinary:  Negative for difficulty urinating, dysuria, flank pain and frequency.  Musculoskeletal:  Negative for arthralgias, back pain, joint swelling and myalgias.  Skin:  Negative for color change, pallor, rash and wound.  Neurological:  Negative for dizziness, facial asymmetry, weakness, numbness and headaches.  Psychiatric/Behavioral:  Negative for behavioral problems, confusion, self-injury and suicidal ideas.     Objective:   Today's Vitals: BP (!) 96/56   Pulse 88   Ht 5' 6 (1.676 m)   Wt 120 lb (54.4 kg)   LMP  (LMP Unknown)   SpO2 96%   Breastfeeding No   BMI 19.37 kg/m   Physical Exam Vitals and nursing note reviewed.   Constitutional:      General: She is not in acute distress.    Appearance: Normal appearance. She is obese. She is not ill-appearing, toxic-appearing or diaphoretic.     Comments: Drowsy  HENT:     Mouth/Throat:     Mouth: Mucous membranes are moist.     Pharynx: Oropharynx is clear. No oropharyngeal exudate or posterior oropharyngeal erythema.  Eyes:     General: Scleral icterus present.        Right eye: No discharge.  Left eye: No discharge.     Extraocular Movements: Extraocular movements intact.  Cardiovascular:     Rate and Rhythm: Normal rate and regular rhythm.     Pulses: Normal pulses.     Heart sounds: Normal heart sounds. No murmur heard.    No friction rub. No gallop.  Pulmonary:     Effort: Pulmonary effort is normal. No respiratory distress.     Breath sounds: Normal breath sounds. No stridor. No wheezing, rhonchi or rales.  Chest:     Chest wall: No tenderness.  Abdominal:     General: There is no distension.     Palpations: Abdomen is soft.     Tenderness: There is no abdominal tenderness. There is no right CVA tenderness, left CVA tenderness or guarding.  Musculoskeletal:        General: No swelling, tenderness, deformity or signs of injury.     Right lower leg: No edema.     Left lower leg: No edema.  Skin:    General: Skin is warm and dry.     Capillary Refill: Capillary refill takes less than 2 seconds.     Coloration: Skin is not jaundiced or pale.     Findings: No bruising, erythema or lesion.  Neurological:     Mental Status: She is oriented to person, place, and time.     Motor: No weakness.     Gait: Gait normal.  Psychiatric:        Mood and Affect: Mood normal.        Behavior: Behavior normal.        Thought Content: Thought content normal.        Judgment: Judgment normal.     Assessment & Plan:   Problem List Items Addressed This Visit       Other   Tobacco use disorder    Smokes three to four cigarettes a day. Discussed  health risks and encouraged quitting. - Encouraged smoking cessation.      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 Patient was reminded of the need to seek medical attention immediately if any symptom of bleeding, anemia, or infection occurs.   Referred for annual eye exam        Relevant Orders   Sickle Cell Panel   Ambulatory referral to Ophthalmology   POCT URINALYSIS DIP (CLINITEK) (Completed)   Cocaine  abuse (HCC)   Need to quit using drugs discussed, declined referral for drug rehab      Housing instability   Currently lives with her grandparents Find help resources provided      Food insecurity   Find health resources provided Food from the clinic pantry also provided      Need for influenza vaccination   Relevant Orders   Flu vaccine trivalent PF, 6mos and older(Flulaval,Afluria,Fluarix,Fluzone) (Completed)   Irregular menses   Pregnancy test negative Sickle-cell disease without crisis No recent crises. Emphasized hydration and avoiding infection triggers. - Referred to an eye doctor for annual screening. - Encouraged hydration and avoidance of infection triggers. - Advised to seek urgent medical attention if signs of crisis occur.       Relevant Orders   POCT urine pregnancy (Completed)   Screen for STD (sexually transmitted disease)   Screening and management of sexually transmitted infections Reports symptoms suggestive of an STD. - Screened for STDs.  Relevant Orders   HepB+HepC+HIV Panel   RPR   NuSwab Vaginitis Plus (VG+)   Galactorrhea    Reports milk production in breasts for about a month, pregnancy test negative ollow-up on this at next visit, will add on prolactin       Health care maintenance   General health maintenance Has not had a Pap smear or eye exam in the past  year. Discussed importance of regular screenings and vaccinations. - Will schedule Pap smear. - Administered flu shot. - Encouraged regular eye exams.       Outpatient Encounter Medications as of 11/05/2024  Medication Sig   ibuprofen  (ADVIL ) 200 MG tablet Take 200 mg by mouth every 8 (eight) hours as needed for moderate pain. (Patient not taking: Reported on 11/05/2024)   ondansetron  (ZOFRAN ) 4 MG tablet Take 1 tablet (4 mg total) by mouth every 6 (six) hours. (Patient not taking: Reported on 11/05/2024)   No facility-administered encounter medications on file as of 11/05/2024.    Follow-up: Return in about 2 months (around 01/03/2025) for CPE.   Taijon Vink R Brylei Pedley, FNP

## 2024-11-05 NOTE — Assessment & Plan Note (Signed)
 Screening and management of sexually transmitted infections Reports symptoms suggestive of an STD. - Screened for STDs.

## 2024-11-05 NOTE — Patient Instructions (Signed)
 1. 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 History of substance abuse (HCC)  3. Food insecurity  4. Housing instability  5. Screen for STD (sexually transmitted disease) - HepB+HepC+HIV Panel - RPR - NuSwab Vaginitis Plus (VG+)  6. Irregular menses - POCT urine pregnancy  7. Need for influenza vaccination    It is important that you exercise regularly at least 30 minutes 5 times a week as tolerated  Think about what you will eat, plan ahead. Choose  clean, green, fresh or frozen over canned, processed or packaged foods which are more sugary, salty and fatty. 70 to 75% of food eaten should be vegetables and fruit. Three meals at set times with snacks allowed between meals, but they must be fruit or vegetables. Aim to eat over a 12 hour period , example 7 am to 7 pm, and STOP after  your last meal of the day. Drink water,generally about 64 ounces per day, no other drink is as healthy. Fruit juice is best enjoyed in a healthy way, by EATING the fruit.  Thanks for choosing [facility] we consider it a privelige to serve you.

## 2024-11-05 NOTE — Assessment & Plan Note (Signed)
 Pregnancy test negative Sickle-cell disease without crisis No recent crises. Emphasized hydration and avoiding infection triggers. - Referred to an eye doctor for annual screening. - Encouraged hydration and avoidance of infection triggers. - Advised to seek urgent medical attention if signs of crisis occur.

## 2024-11-05 NOTE — Assessment & Plan Note (Addendum)
 Has infrequent crisis ,we discussed the need for good hydration, monitoring of hydration status, avoidance of heat, cold, stress, and infection triggers. Patient was reminded of the need to seek medical attention immediately if any symptom of bleeding, anemia, or infection occurs.   Referred for annual eye exam

## 2024-11-05 NOTE — Assessment & Plan Note (Signed)
 General health maintenance Has not had a Pap smear or eye exam in the past year. Discussed importance of regular screenings and vaccinations. - Will schedule Pap smear. - Administered flu shot. - Encouraged regular eye exams.

## 2024-11-05 NOTE — Assessment & Plan Note (Signed)
 Find health resources provided Food from the clinic pantry also provided

## 2024-11-05 NOTE — Assessment & Plan Note (Signed)
" °  Reports milk production in breasts for about a month, pregnancy test negative ollow-up on this at next visit, will add on prolactin  "

## 2024-11-06 LAB — CMP14+CBC/D/PLT+FER+RETIC+V...
ALT: 14 IU/L (ref 0–32)
AST: 29 IU/L (ref 0–40)
Albumin: 4.5 g/dL (ref 4.0–5.0)
Alkaline Phosphatase: 73 IU/L (ref 41–116)
BUN/Creatinine Ratio: 9 (ref 9–23)
BUN: 9 mg/dL (ref 6–20)
Basophils Absolute: 0.1 x10E3/uL (ref 0.0–0.2)
Basos: 1 %
Bilirubin Total: 2.9 mg/dL — ABNORMAL HIGH (ref 0.0–1.2)
CO2: 21 mmol/L (ref 20–29)
Calcium: 9.6 mg/dL (ref 8.7–10.2)
Chloride: 103 mmol/L (ref 96–106)
Creatinine, Ser: 1.03 mg/dL — ABNORMAL HIGH (ref 0.57–1.00)
EOS (ABSOLUTE): 0.2 x10E3/uL (ref 0.0–0.4)
Eos: 1 %
Ferritin: 1019 ng/mL — ABNORMAL HIGH (ref 15–150)
Globulin, Total: 3.1 g/dL (ref 1.5–4.5)
Glucose: 91 mg/dL (ref 70–99)
Hematocrit: 29.9 % — ABNORMAL LOW (ref 34.0–46.6)
Hemoglobin: 10 g/dL — ABNORMAL LOW (ref 11.1–15.9)
Immature Grans (Abs): 0.1 x10E3/uL (ref 0.0–0.1)
Immature Granulocytes: 1 %
Lymphocytes Absolute: 5.7 x10E3/uL — ABNORMAL HIGH (ref 0.7–3.1)
Lymphs: 43 %
MCH: 35.6 pg — ABNORMAL HIGH (ref 26.6–33.0)
MCHC: 33.4 g/dL (ref 31.5–35.7)
MCV: 106 fL — ABNORMAL HIGH (ref 79–97)
Monocytes Absolute: 0.8 x10E3/uL (ref 0.1–0.9)
Monocytes: 6 %
NRBC: 3 % — ABNORMAL HIGH (ref 0–0)
Neutrophils Absolute: 6.3 x10E3/uL (ref 1.4–7.0)
Neutrophils: 48 %
Platelets: 538 x10E3/uL — ABNORMAL HIGH (ref 150–450)
Potassium: 4.6 mmol/L (ref 3.5–5.2)
RBC: 2.81 x10E6/uL — ABNORMAL LOW (ref 3.77–5.28)
RDW: 17.2 % — ABNORMAL HIGH (ref 11.7–15.4)
Retic Ct Pct: 13 % — ABNORMAL HIGH (ref 0.6–2.6)
Sodium: 139 mmol/L (ref 134–144)
Total Protein: 7.6 g/dL (ref 6.0–8.5)
Vit D, 25-Hydroxy: 9.4 ng/mL — ABNORMAL LOW (ref 30.0–100.0)
WBC: 13.2 x10E3/uL — ABNORMAL HIGH (ref 3.4–10.8)
eGFR: 75 mL/min/1.73

## 2024-11-06 LAB — HEPB+HEPC+HIV PANEL
HIV Screen 4th Generation wRfx: NONREACTIVE
Hep B C IgM: NEGATIVE
Hep B Core Total Ab: NEGATIVE
Hep B E Ab: NONREACTIVE
Hep B E Ag: NEGATIVE
Hep B Surface Ab, Qual: NONREACTIVE
Hep C Virus Ab: NONREACTIVE
Hepatitis B Surface Ag: NEGATIVE

## 2024-11-06 LAB — SYPHILIS: RPR W/REFLEX TO RPR TITER AND TREPONEMAL ANTIBODIES, TRADITIONAL SCREENING AND DIAGNOSIS ALGORITHM: RPR Ser Ql: NONREACTIVE

## 2024-11-07 ENCOUNTER — Other Ambulatory Visit (HOSPITAL_COMMUNITY): Payer: Self-pay

## 2024-11-07 ENCOUNTER — Other Ambulatory Visit: Payer: Self-pay | Admitting: Nurse Practitioner

## 2024-11-07 ENCOUNTER — Ambulatory Visit: Payer: Self-pay | Admitting: Nurse Practitioner

## 2024-11-07 DIAGNOSIS — B3731 Acute candidiasis of vulva and vagina: Secondary | ICD-10-CM

## 2024-11-07 DIAGNOSIS — E559 Vitamin D deficiency, unspecified: Secondary | ICD-10-CM

## 2024-11-07 DIAGNOSIS — N643 Galactorrhea not associated with childbirth: Secondary | ICD-10-CM

## 2024-11-07 LAB — NUSWAB VAGINITIS PLUS (VG+)
Candida albicans, NAA: POSITIVE — AB
Candida glabrata, NAA: POSITIVE — AB
Chlamydia trachomatis, NAA: NEGATIVE
Megasphaera 1: HIGH {score} — AB
Neisseria gonorrhoeae, NAA: NEGATIVE
Trich vag by NAA: NEGATIVE

## 2024-11-07 MED ORDER — VITAMIN D (ERGOCALCIFEROL) 1.25 MG (50000 UNIT) PO CAPS
50000.0000 [IU] | ORAL_CAPSULE | ORAL | 1 refills | Status: AC
  Filled 2024-11-07: qty 8, 56d supply, fill #0

## 2024-11-07 MED ORDER — FLUCONAZOLE 150 MG PO TABS
150.0000 mg | ORAL_TABLET | Freq: Once | ORAL | 0 refills | Status: AC
  Filled 2024-11-07: qty 1, 1d supply, fill #0

## 2024-11-08 LAB — SPECIMEN STATUS REPORT

## 2024-11-08 LAB — PROLACTIN: Prolactin: 20.9 ng/mL (ref 4.8–33.4)

## 2024-11-23 ENCOUNTER — Other Ambulatory Visit (HOSPITAL_COMMUNITY): Payer: Self-pay

## 2025-01-05 ENCOUNTER — Ambulatory Visit: Payer: Self-pay | Admitting: Nurse Practitioner
